# Patient Record
Sex: Female | Born: 1985 | Race: Black or African American | Hispanic: No | Marital: Married | Smoking: Former smoker
Health system: Southern US, Community
[De-identification: ages and names within clinical notes are randomized; demographics above are authoritative.]

## PROBLEM LIST (undated history)

## (undated) ENCOUNTER — Inpatient Hospital Stay (HOSPITAL_COMMUNITY): Payer: Self-pay

## (undated) DIAGNOSIS — IMO0002 Reserved for concepts with insufficient information to code with codable children: Secondary | ICD-10-CM

## (undated) DIAGNOSIS — B951 Streptococcus, group B, as the cause of diseases classified elsewhere: Secondary | ICD-10-CM

## (undated) DIAGNOSIS — N926 Irregular menstruation, unspecified: Secondary | ICD-10-CM

## (undated) DIAGNOSIS — R87619 Unspecified abnormal cytological findings in specimens from cervix uteri: Secondary | ICD-10-CM

## (undated) DIAGNOSIS — B009 Herpesviral infection, unspecified: Secondary | ICD-10-CM

## (undated) DIAGNOSIS — I10 Essential (primary) hypertension: Secondary | ICD-10-CM

## (undated) DIAGNOSIS — O2340 Unspecified infection of urinary tract in pregnancy, unspecified trimester: Secondary | ICD-10-CM

## (undated) DIAGNOSIS — E669 Obesity, unspecified: Secondary | ICD-10-CM

## (undated) HISTORY — DX: Obesity, unspecified: E66.9

## (undated) HISTORY — DX: Streptococcus, group b, as the cause of diseases classified elsewhere: B95.1

## (undated) HISTORY — PX: WISDOM TOOTH EXTRACTION: SHX21

## (undated) HISTORY — DX: Irregular menstruation, unspecified: N92.6

## (undated) HISTORY — DX: Herpesviral infection, unspecified: B00.9

## (undated) HISTORY — DX: Essential (primary) hypertension: I10

## (undated) HISTORY — DX: Unspecified abnormal cytological findings in specimens from cervix uteri: R87.619

## (undated) HISTORY — DX: Unspecified infection of urinary tract in pregnancy, unspecified trimester: O23.40

## (undated) HISTORY — DX: Reserved for concepts with insufficient information to code with codable children: IMO0002

## (undated) HISTORY — PX: TUBAL LIGATION: SHX77

---
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
## 2007-01-18 ENCOUNTER — Ambulatory Visit: Payer: Self-pay | Admitting: Sports Medicine

## 2007-02-19 ENCOUNTER — Ambulatory Visit: Payer: Self-pay | Admitting: Family Medicine

## 2007-02-19 LAB — CONVERTED CEMR LAB
Cholesterol, target level: 200 mg/dL
Hgb A1c MFr Bld: 10 %

## 2007-03-05 ENCOUNTER — Encounter: Admission: RE | Admit: 2007-03-05 | Discharge: 2007-03-26 | Payer: Self-pay | Admitting: Family Medicine

## 2007-03-11 ENCOUNTER — Encounter (INDEPENDENT_AMBULATORY_CARE_PROVIDER_SITE_OTHER): Payer: Self-pay | Admitting: Family Medicine

## 2007-03-25 ENCOUNTER — Encounter (INDEPENDENT_AMBULATORY_CARE_PROVIDER_SITE_OTHER): Payer: Self-pay | Admitting: Family Medicine

## 2007-04-05 ENCOUNTER — Ambulatory Visit: Payer: Self-pay | Admitting: Family Medicine

## 2007-04-21 ENCOUNTER — Encounter (INDEPENDENT_AMBULATORY_CARE_PROVIDER_SITE_OTHER): Payer: Self-pay | Admitting: Family Medicine

## 2007-04-21 ENCOUNTER — Other Ambulatory Visit: Admission: RE | Admit: 2007-04-21 | Discharge: 2007-04-21 | Payer: Self-pay | Admitting: Family Medicine

## 2007-04-21 ENCOUNTER — Ambulatory Visit: Payer: Self-pay | Admitting: Family Medicine

## 2007-04-21 LAB — CONVERTED CEMR LAB
ALT: 19 units/L (ref 0–35)
AST: 12 units/L (ref 0–37)
Albumin: 4.2 g/dL (ref 3.5–5.2)
Alkaline Phosphatase: 131 units/L — ABNORMAL HIGH (ref 39–117)
Beta hcg, urine, semiquantitative: NEGATIVE
CO2: 22 meq/L (ref 19–32)
Chloride: 100 meq/L (ref 96–112)
Cholesterol: 145 mg/dL (ref 0–200)
GC Probe Amp, Genital: NEGATIVE
Glucose, Bld: 302 mg/dL — ABNORMAL HIGH (ref 70–99)
Potassium: 4.5 meq/L (ref 3.5–5.3)
TSH: 1.031 microintl units/mL (ref 0.350–5.50)
Total Bilirubin: 0.5 mg/dL (ref 0.3–1.2)
Total Protein: 7.5 g/dL (ref 6.0–8.3)
Triglycerides: 83 mg/dL (ref <150)
Whiff Test: NEGATIVE

## 2007-05-24 ENCOUNTER — Ambulatory Visit: Payer: Self-pay | Admitting: Sports Medicine

## 2007-06-12 ENCOUNTER — Emergency Department (HOSPITAL_COMMUNITY): Admission: EM | Admit: 2007-06-12 | Discharge: 2007-06-12 | Payer: Self-pay | Admitting: Emergency Medicine

## 2007-06-16 ENCOUNTER — Encounter: Admission: RE | Admit: 2007-06-16 | Discharge: 2007-06-16 | Payer: Self-pay | Admitting: Family Medicine

## 2007-06-21 ENCOUNTER — Ambulatory Visit: Payer: Self-pay | Admitting: Family Medicine

## 2007-08-06 ENCOUNTER — Ambulatory Visit: Payer: Self-pay | Admitting: Family Medicine

## 2007-09-06 ENCOUNTER — Ambulatory Visit: Payer: Self-pay | Admitting: Family Medicine

## 2007-09-07 ENCOUNTER — Encounter (INDEPENDENT_AMBULATORY_CARE_PROVIDER_SITE_OTHER): Payer: Self-pay | Admitting: *Deleted

## 2007-09-20 ENCOUNTER — Ambulatory Visit: Payer: Self-pay | Admitting: Family Medicine

## 2007-09-20 ENCOUNTER — Encounter (INDEPENDENT_AMBULATORY_CARE_PROVIDER_SITE_OTHER): Payer: Self-pay | Admitting: Family Medicine

## 2007-09-20 DIAGNOSIS — E1165 Type 2 diabetes mellitus with hyperglycemia: Secondary | ICD-10-CM

## 2007-09-28 ENCOUNTER — Encounter (INDEPENDENT_AMBULATORY_CARE_PROVIDER_SITE_OTHER): Payer: Self-pay | Admitting: Family Medicine

## 2007-10-12 ENCOUNTER — Encounter (INDEPENDENT_AMBULATORY_CARE_PROVIDER_SITE_OTHER): Payer: Self-pay | Admitting: Family Medicine

## 2007-10-12 ENCOUNTER — Ambulatory Visit: Payer: Self-pay | Admitting: Family Medicine

## 2007-10-12 LAB — CONVERTED CEMR LAB
CO2: 22 meq/L (ref 19–32)
Creatinine, Ser: 0.77 mg/dL (ref 0.40–1.20)
Glucose, Bld: 151 mg/dL — ABNORMAL HIGH (ref 70–99)
Potassium: 4.4 meq/L (ref 3.5–5.3)

## 2007-10-13 ENCOUNTER — Emergency Department (HOSPITAL_COMMUNITY): Admission: EM | Admit: 2007-10-13 | Discharge: 2007-10-13 | Payer: Self-pay | Admitting: Emergency Medicine

## 2007-10-22 ENCOUNTER — Ambulatory Visit: Payer: Self-pay | Admitting: Family Medicine

## 2007-10-22 ENCOUNTER — Telehealth (INDEPENDENT_AMBULATORY_CARE_PROVIDER_SITE_OTHER): Payer: Self-pay | Admitting: Family Medicine

## 2007-10-25 ENCOUNTER — Encounter (INDEPENDENT_AMBULATORY_CARE_PROVIDER_SITE_OTHER): Payer: Self-pay | Admitting: Family Medicine

## 2007-11-01 ENCOUNTER — Telehealth: Payer: Self-pay | Admitting: *Deleted

## 2007-11-01 ENCOUNTER — Emergency Department (HOSPITAL_COMMUNITY): Admission: EM | Admit: 2007-11-01 | Discharge: 2007-11-01 | Payer: Self-pay | Admitting: Emergency Medicine

## 2007-11-23 ENCOUNTER — Ambulatory Visit: Payer: Self-pay | Admitting: Family Medicine

## 2007-11-23 ENCOUNTER — Telehealth: Payer: Self-pay | Admitting: *Deleted

## 2007-11-23 LAB — CONVERTED CEMR LAB
Beta hcg, urine, semiquantitative: NEGATIVE
Bilirubin Urine: NEGATIVE
Blood Glucose, Fingerstick: 188
Blood in Urine, dipstick: NEGATIVE
Glucose, Urine, Semiquant: NEGATIVE
Ketones, urine, test strip: NEGATIVE
Nitrite: NEGATIVE
Protein, U semiquant: NEGATIVE
Specific Gravity, Urine: 1.02
Urobilinogen, UA: 0.2
WBC Urine, dipstick: NEGATIVE
pH: 7.5

## 2007-12-06 ENCOUNTER — Emergency Department (HOSPITAL_COMMUNITY): Admission: EM | Admit: 2007-12-06 | Discharge: 2007-12-06 | Payer: Self-pay | Admitting: Emergency Medicine

## 2007-12-21 ENCOUNTER — Telehealth: Payer: Self-pay | Admitting: *Deleted

## 2008-01-07 ENCOUNTER — Emergency Department (HOSPITAL_COMMUNITY): Admission: EM | Admit: 2008-01-07 | Discharge: 2008-01-07 | Payer: Self-pay | Admitting: Emergency Medicine

## 2008-01-18 ENCOUNTER — Emergency Department (HOSPITAL_COMMUNITY): Admission: EM | Admit: 2008-01-18 | Discharge: 2008-01-19 | Payer: Self-pay | Admitting: Emergency Medicine

## 2008-01-25 ENCOUNTER — Ambulatory Visit: Payer: Self-pay | Admitting: Family Medicine

## 2008-01-25 ENCOUNTER — Encounter: Payer: Self-pay | Admitting: Family Medicine

## 2008-01-25 LAB — CONVERTED CEMR LAB: Beta hcg, urine, semiquantitative: NEGATIVE

## 2008-02-08 ENCOUNTER — Emergency Department (HOSPITAL_COMMUNITY): Admission: EM | Admit: 2008-02-08 | Discharge: 2008-02-08 | Payer: Self-pay | Admitting: Emergency Medicine

## 2008-02-08 ENCOUNTER — Telehealth: Payer: Self-pay | Admitting: *Deleted

## 2008-02-09 ENCOUNTER — Encounter (INDEPENDENT_AMBULATORY_CARE_PROVIDER_SITE_OTHER): Payer: Self-pay | Admitting: *Deleted

## 2008-02-18 ENCOUNTER — Ambulatory Visit: Payer: Self-pay | Admitting: Family Medicine

## 2008-02-18 ENCOUNTER — Encounter (INDEPENDENT_AMBULATORY_CARE_PROVIDER_SITE_OTHER): Payer: Self-pay | Admitting: Family Medicine

## 2008-02-18 LAB — CONVERTED CEMR LAB: Beta hcg, urine, semiquantitative: NEGATIVE

## 2008-02-21 LAB — CONVERTED CEMR LAB
Prolactin: 9.5 ng/mL
TSH: 0.514 microintl units/mL (ref 0.350–4.50)
hCG, Beta Chain, Quant, S: <2 milliintl units/mL

## 2008-04-05 ENCOUNTER — Ambulatory Visit: Payer: Self-pay | Admitting: Family Medicine

## 2008-04-05 ENCOUNTER — Encounter (INDEPENDENT_AMBULATORY_CARE_PROVIDER_SITE_OTHER): Payer: Self-pay | Admitting: Family Medicine

## 2008-04-05 LAB — CONVERTED CEMR LAB
Chlamydia, Swab/Urine, PCR: NEGATIVE
GC Probe Amp, Urine: NEGATIVE

## 2008-04-06 ENCOUNTER — Encounter (INDEPENDENT_AMBULATORY_CARE_PROVIDER_SITE_OTHER): Payer: Self-pay | Admitting: Family Medicine

## 2008-04-12 ENCOUNTER — Encounter (INDEPENDENT_AMBULATORY_CARE_PROVIDER_SITE_OTHER): Payer: Self-pay | Admitting: Family Medicine

## 2008-04-12 ENCOUNTER — Ambulatory Visit: Payer: Self-pay | Admitting: Family Medicine

## 2008-04-12 LAB — CONVERTED CEMR LAB: Beta hcg, urine, semiquantitative: NEGATIVE

## 2008-04-27 ENCOUNTER — Encounter (INDEPENDENT_AMBULATORY_CARE_PROVIDER_SITE_OTHER): Payer: Self-pay | Admitting: Family Medicine

## 2008-05-02 ENCOUNTER — Ambulatory Visit: Payer: Self-pay | Admitting: Family Medicine

## 2008-05-29 ENCOUNTER — Ambulatory Visit: Payer: Self-pay | Admitting: Family Medicine

## 2008-05-29 ENCOUNTER — Encounter (INDEPENDENT_AMBULATORY_CARE_PROVIDER_SITE_OTHER): Payer: Self-pay | Admitting: Family Medicine

## 2008-07-18 ENCOUNTER — Encounter (INDEPENDENT_AMBULATORY_CARE_PROVIDER_SITE_OTHER): Payer: Self-pay | Admitting: Family Medicine

## 2008-07-18 ENCOUNTER — Ambulatory Visit: Payer: Self-pay | Admitting: Family Medicine

## 2008-07-18 ENCOUNTER — Other Ambulatory Visit: Admission: RE | Admit: 2008-07-18 | Discharge: 2008-07-18 | Payer: Self-pay | Admitting: Family Medicine

## 2008-07-19 ENCOUNTER — Encounter (INDEPENDENT_AMBULATORY_CARE_PROVIDER_SITE_OTHER): Payer: Self-pay | Admitting: Family Medicine

## 2008-07-20 ENCOUNTER — Telehealth: Payer: Self-pay | Admitting: *Deleted

## 2008-07-25 ENCOUNTER — Encounter (INDEPENDENT_AMBULATORY_CARE_PROVIDER_SITE_OTHER): Payer: Self-pay | Admitting: Family Medicine

## 2008-08-16 ENCOUNTER — Ambulatory Visit: Payer: Self-pay | Admitting: Family Medicine

## 2008-09-05 ENCOUNTER — Encounter (INDEPENDENT_AMBULATORY_CARE_PROVIDER_SITE_OTHER): Payer: Self-pay | Admitting: Family Medicine

## 2008-09-05 ENCOUNTER — Ambulatory Visit: Payer: Self-pay | Admitting: Family Medicine

## 2008-09-05 LAB — CONVERTED CEMR LAB: Whiff Test: POSITIVE

## 2008-09-07 ENCOUNTER — Telehealth (INDEPENDENT_AMBULATORY_CARE_PROVIDER_SITE_OTHER): Payer: Self-pay | Admitting: Family Medicine

## 2008-09-07 ENCOUNTER — Ambulatory Visit: Payer: Self-pay | Admitting: Family Medicine

## 2008-09-07 LAB — CONVERTED CEMR LAB: Chlamydia, DNA Probe: POSITIVE — AB

## 2008-09-19 ENCOUNTER — Telehealth (INDEPENDENT_AMBULATORY_CARE_PROVIDER_SITE_OTHER): Payer: Self-pay | Admitting: Family Medicine

## 2008-11-07 ENCOUNTER — Telehealth: Payer: Self-pay | Admitting: Family Medicine

## 2008-11-16 ENCOUNTER — Encounter: Payer: Self-pay | Admitting: Family Medicine

## 2008-11-16 ENCOUNTER — Ambulatory Visit: Payer: Self-pay | Admitting: Family Medicine

## 2008-11-16 ENCOUNTER — Telehealth: Payer: Self-pay | Admitting: Family Medicine

## 2008-11-16 LAB — CONVERTED CEMR LAB
GC Probe Amp, Genital: NEGATIVE
Whiff Test: NEGATIVE

## 2008-11-20 ENCOUNTER — Emergency Department (HOSPITAL_COMMUNITY): Admission: EM | Admit: 2008-11-20 | Discharge: 2008-11-20 | Payer: Self-pay | Admitting: Emergency Medicine

## 2008-12-06 ENCOUNTER — Encounter: Payer: Self-pay | Admitting: Family Medicine

## 2008-12-07 ENCOUNTER — Ambulatory Visit: Payer: Self-pay | Admitting: Family Medicine

## 2008-12-07 ENCOUNTER — Encounter: Payer: Self-pay | Admitting: Family Medicine

## 2008-12-07 LAB — CONVERTED CEMR LAB
Albumin: 4 g/dL (ref 3.5–5.2)
Alkaline Phosphatase: 120 units/L — ABNORMAL HIGH (ref 39–117)
Calcium: 9.3 mg/dL (ref 8.4–10.5)
Creatinine, Ser: 0.64 mg/dL (ref 0.40–1.20)
Glucose, Bld: 212 mg/dL — ABNORMAL HIGH (ref 70–99)
Potassium: 4.6 meq/L (ref 3.5–5.3)

## 2008-12-11 ENCOUNTER — Telehealth: Payer: Self-pay | Admitting: Family Medicine

## 2008-12-22 ENCOUNTER — Telehealth: Payer: Self-pay | Admitting: Family Medicine

## 2009-02-28 ENCOUNTER — Emergency Department (HOSPITAL_COMMUNITY): Admission: EM | Admit: 2009-02-28 | Discharge: 2009-02-28 | Payer: Self-pay | Admitting: Emergency Medicine

## 2009-05-11 ENCOUNTER — Emergency Department (HOSPITAL_COMMUNITY): Admission: EM | Admit: 2009-05-11 | Discharge: 2009-05-11 | Payer: Self-pay | Admitting: Emergency Medicine

## 2009-05-18 ENCOUNTER — Ambulatory Visit: Payer: Self-pay | Admitting: Family Medicine

## 2009-05-18 LAB — CONVERTED CEMR LAB
Beta hcg, urine, semiquantitative: NEGATIVE
Hgb A1c MFr Bld: 8.9 %

## 2009-05-30 ENCOUNTER — Encounter: Payer: Self-pay | Admitting: Family Medicine

## 2009-05-30 ENCOUNTER — Ambulatory Visit: Payer: Self-pay | Admitting: Family Medicine

## 2009-06-04 ENCOUNTER — Encounter: Payer: Self-pay | Admitting: Family Medicine

## 2009-06-06 ENCOUNTER — Emergency Department (HOSPITAL_COMMUNITY): Admission: EM | Admit: 2009-06-06 | Discharge: 2009-06-06 | Payer: Self-pay | Admitting: Emergency Medicine

## 2009-07-25 ENCOUNTER — Ambulatory Visit: Payer: Self-pay | Admitting: Family Medicine

## 2009-07-25 ENCOUNTER — Encounter: Payer: Self-pay | Admitting: Family Medicine

## 2009-07-25 DIAGNOSIS — Z9189 Other specified personal risk factors, not elsewhere classified: Secondary | ICD-10-CM | POA: Insufficient documentation

## 2009-07-25 LAB — CONVERTED CEMR LAB: GC Probe Amp, Genital: NEGATIVE

## 2009-07-26 ENCOUNTER — Telehealth: Payer: Self-pay | Admitting: Family Medicine

## 2009-07-26 ENCOUNTER — Encounter: Payer: Self-pay | Admitting: Family Medicine

## 2009-07-27 ENCOUNTER — Ambulatory Visit: Payer: Self-pay | Admitting: Family Medicine

## 2009-07-28 ENCOUNTER — Emergency Department (HOSPITAL_COMMUNITY): Admission: EM | Admit: 2009-07-28 | Discharge: 2009-07-28 | Payer: Self-pay | Admitting: Emergency Medicine

## 2009-08-02 ENCOUNTER — Ambulatory Visit: Payer: Self-pay | Admitting: Family Medicine

## 2009-08-09 ENCOUNTER — Ambulatory Visit: Payer: Self-pay | Admitting: Family Medicine

## 2009-08-09 DIAGNOSIS — I10 Essential (primary) hypertension: Secondary | ICD-10-CM

## 2009-08-09 DIAGNOSIS — E1159 Type 2 diabetes mellitus with other circulatory complications: Secondary | ICD-10-CM | POA: Insufficient documentation

## 2009-09-12 ENCOUNTER — Emergency Department (HOSPITAL_COMMUNITY): Admission: EM | Admit: 2009-09-12 | Discharge: 2009-09-12 | Payer: Self-pay | Admitting: Emergency Medicine

## 2009-10-24 ENCOUNTER — Emergency Department (HOSPITAL_COMMUNITY): Admission: EM | Admit: 2009-10-24 | Discharge: 2009-10-24 | Payer: Self-pay | Admitting: Family Medicine

## 2009-11-26 ENCOUNTER — Emergency Department (HOSPITAL_COMMUNITY): Admission: EM | Admit: 2009-11-26 | Discharge: 2009-11-26 | Payer: Self-pay | Admitting: Emergency Medicine

## 2009-12-04 ENCOUNTER — Emergency Department (HOSPITAL_COMMUNITY): Admission: EM | Admit: 2009-12-04 | Discharge: 2009-12-04 | Payer: Self-pay | Admitting: Emergency Medicine

## 2010-01-07 ENCOUNTER — Ambulatory Visit: Payer: Self-pay | Admitting: Family Medicine

## 2010-01-07 ENCOUNTER — Encounter: Payer: Self-pay | Admitting: Family Medicine

## 2010-01-07 LAB — CONVERTED CEMR LAB: GC Probe Amp, Genital: NEGATIVE

## 2010-01-08 ENCOUNTER — Encounter: Payer: Self-pay | Admitting: Family Medicine

## 2010-01-15 ENCOUNTER — Encounter: Payer: Self-pay | Admitting: Family Medicine

## 2010-02-11 ENCOUNTER — Encounter: Payer: Self-pay | Admitting: Family Medicine

## 2010-02-11 DIAGNOSIS — J45909 Unspecified asthma, uncomplicated: Secondary | ICD-10-CM | POA: Insufficient documentation

## 2010-03-06 ENCOUNTER — Encounter: Payer: Self-pay | Admitting: Family Medicine

## 2010-03-06 ENCOUNTER — Ambulatory Visit: Payer: Self-pay | Admitting: Family Medicine

## 2010-03-06 LAB — CONVERTED CEMR LAB
Beta hcg, urine, semiquantitative: NEGATIVE
Hgb A1c MFr Bld: 10 %

## 2010-03-27 LAB — CONVERTED CEMR LAB
Alkaline Phosphatase: 126 units/L — ABNORMAL HIGH (ref 39–117)
BUN: 11 mg/dL (ref 6–23)
Calcium: 9.3 mg/dL (ref 8.4–10.5)
Chloride: 102 meq/L (ref 96–112)
Creatinine, Ser: 0.66 mg/dL (ref 0.40–1.20)
Glucose, Bld: 316 mg/dL — ABNORMAL HIGH (ref 70–99)
Total Bilirubin: 0.4 mg/dL (ref 0.3–1.2)

## 2010-04-10 ENCOUNTER — Encounter: Payer: Self-pay | Admitting: Family Medicine

## 2010-04-29 ENCOUNTER — Ambulatory Visit: Payer: Self-pay | Admitting: Family Medicine

## 2010-04-29 DIAGNOSIS — M25539 Pain in unspecified wrist: Secondary | ICD-10-CM

## 2010-04-29 DIAGNOSIS — N898 Other specified noninflammatory disorders of vagina: Secondary | ICD-10-CM | POA: Insufficient documentation

## 2010-04-29 LAB — CONVERTED CEMR LAB: Whiff Test: NEGATIVE

## 2010-06-11 NOTE — Assessment & Plan Note (Signed)
 Summary: vomiting since last night (has DM)   Vital Signs:  Patient Profile:   25 Years Old Female Height:     61 inches Weight:      197.5 pounds Temp:     98.9 degrees F Pulse rate:   96 / minute BP sitting:   134 / 73  (left arm)  Vitals Entered By: AVELINA SHARPS RN (November 23, 2007 2:00 PM)             Is Patient Diabetic? No CBG Result 188     Chief Complaint:  started with vomiting at 2:00 am today and last vomiting at 10:00 AM.  History of Present Illness: This is a 25 year old female with vomiting since 2:00 am last night.  Woke up and felt nauseous, and had an episode of emesis, appeared to be what she had last eaten, no blood, mucus, or bile.  Was able to get back to sleep but had another episode this morning.  Pt was able to eat and drink fluids this morning .  Pt also gets Depo-provera but has not had a shot in over 6 months and does not remember when last menstrual period was.  States is currently sexually active.  Denies headache, blurry vision, focal weakness, sore throat, swollen nodes, cough, SOB, CP, abd pain, diarrhea, constipation, dysuria, frequency, urgency, vaginal odor, vaginal discharge.     Current Allergies: No known allergies   Past Medical History:    h/o ADHD- no tx 7/05, LSIL PAP 6/06    Genital Herpes    DIabetes Mellitus Type 2      Physical Exam  General:     Well-developed,well-nourished,in no acute distress; alert,appropriate and cooperative throughout examination Head:     Normocephalic and atraumatic without obvious abnormalities. No apparent alopecia or balding. Eyes:     No corneal or conjunctival inflammation noted. EOMI. Perrla. Funduscopic exam benign, without hemorrhages, exudates or papilledema. Vision grossly normal. Ears:     External ear exam shows no significant lesions or deformities.  Otoscopic examination reveals clear canals, tympanic membranes are intact bilaterally without bulging, retraction, inflammation or  discharge. Hearing is grossly normal bilaterally. Nose:     External nasal examination shows no deformity or inflammation. Nasal mucosa are pink and moist without lesions or exudates. Mouth:     Oral mucosa and oropharynx without lesions or exudates. Neck:     No deformities, masses, or tenderness noted. Lungs:     Normal respiratory effort, chest expands symmetrically. Lungs are clear to auscultation, no crackles or wheezes. Heart:     Normal rate and regular rhythm. S1 and S2 normal without gallop, murmur, click, rub or other extra sounds. Abdomen:     Bowel sounds positive,abdomen soft and non-tender without masses, organomegaly or hernias noted. Extremities:     Normal, ambulatory, well perfused Neurologic:     Grossly Normal Skin:     No rashes noted. Cervical Nodes:     No lymphadenopathy noted Axillary Nodes:     No palpable lymphadenopathy Inguinal Nodes:     No significant adenopathy    Impression & Recommendations:  Problem # 1:  PERSISTENT VOMITING (ICD-536.2) Ordered U/A and Urine pregnancy test, both came back negative.  Likely viral syndrome. Gave patient Phenergan  25mg  IM X1 for nausea. Pt instructed to call back and make f/u appt if symptoms worsen.  Orders: U Preg-[facility] (81025) [facility]- Est  Level 4 (99214) Promethazine  up to 50mg  (G7449)    Problem # 2:  DIABETES MELLITUS, TYPE II, UNCONTROLLED, W/RENAL COMPS (ICD-250.42) Pt instructed to return to clinic to see me in one month regarding her Diabetes and other medical problems Her updated medication list for this problem includes:    Janumet 50-1000 Mg Tabs (Sitagliptin -metformin  hcl) ..... One tablet twice a day for diabetes control.  stop taking your metformin .    Lisinopril 2.5 Mg Tabs (Lisinopril) ..... One tablet daily for kidney protection in diabetes  Orders: Urinalysis-[facility] (00000) Glucose Cap-[facility] (17051)   Complete Medication List: 1)  Janumet 50-1000 Mg Tabs (Sitagliptin -metformin  hcl) ....  One tablet twice a day for diabetes control.  stop taking your metformin . 2)  Valtrex  500 Mg Tabs (Valacyclovir  hcl) .SABRA.. 1 tab daily for suppression of severe attacks. 3)  Depo-provera 150 Mg/ml Susp (Medroxyprogesterone acetate) .... Inject intramuscularly as directed 4)  Lisinopril 2.5 Mg Tabs (Lisinopril) .... One tablet daily for kidney protection in diabetes   Patient Instructions: 1)  -Please allow your sister to drive you home.   2)  -Please call and make a follow up appointment in one week if vomiting continues, nausea doesnt resolve, or symptoms significantly worsen.  3)  -Call back in one month to make a follow up appointment to see me regarding your other medical problems. 4)  -If you could be exposed to sexually transmitted diseases, you should use a condom. 5)  -If you are having sex and you or your partner don't want a child, use contraception. 6)  -Check your blood sugars regularly.   ] Laboratory Results   Urine Tests  Date/Time Received: November 23, 2007 2:50 PM  Date/Time Reported: November 23, 2007 3:01 PM   Routine Urinalysis   Color: yellow Appearance: Clear Glucose: negative   (Normal Range: Negative) Bilirubin: negative   (Normal Range: Negative) Ketone: negative   (Normal Range: Negative) Spec. Gravity: 1.020   (Normal Range: 1.003-1.035) Blood: negative   (Normal Range: Negative) pH: 7.5   (Normal Range: 5.0-8.0) Protein: negative   (Normal Range: Negative) Urobilinogen: 0.2   (Normal Range: 0-1) Nitrite: negative   (Normal Range: Negative) Leukocyte Esterace: negative   (Normal Range: Negative)    Urine HCG: negative Comments: ...........test performed by...........SABRAArland Morel, CMA   Blood Tests   Date/Time Received: November 23, 2007 2:50 PM  Date/Time Reported: November 23, 2007 3:02 PM  Time patient last ate: 10:00 AM  CBG Random:: 188mg /dL  Comments: ...........test performed by...........SABRAArland Morel, CMA        Medication  Administration  Injection # 1:    Medication: Promethazine  up to 50mg     Diagnosis: PERSISTENT VOMITING (ICD-536.2)    Route: IM    Site: RUOQ gluteus    Exp Date: 04/11/2009    Lot #: [id number]    Mfr: [email or web address]    Comments: Gave 25 mg. Pt. instructed not to drive.    Patient tolerated injection without complications    Given by: NATHANEL SABA RN (November 23, 2007 4:34 PM)  Orders Added: 1)  Urinalysis-[facility] [00000] 2)  U Preg-[facility] [81025] 3)  Glucose Cap-[facility] [82948] 4)  [facility]- Est  Level 4 [00785] 5)  Promethazine  up to 50mg  [J2550]
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
## 2010-06-11 NOTE — Letter (Signed)
Summary: Generic Letter  [facility]  [address]   [place], [place] [address]   Phone: [phone]  Fax: [phone]    01/08/2010  Roberta KOLK [address] [place], [place]  [address]  Dear Ms. DOUCETTE,  Your recent test results were normal. Please schedule an appointment in clinic if your symptoms do not resolve by calling [phone].  Sincerely,  Lloyd Huger MD  Appended Document: Generic Letter mailed

## 2010-06-11 NOTE — Miscellaneous (Signed)
Summary: Needs Chlamydia Treatment  Patient has been informed that she has a positive test for Chlamydia and requires treatment.  When she comes in for treatment, please confirm that she is not allergic to Azithromycin and give her 1 gram Azithromycin by mouth x1.  Please let this note serve as the order.

## 2010-06-11 NOTE — Assessment & Plan Note (Signed)
Summary: F/U VISIT/[facility]   Vital Signs:  Patient profile:   25 year old female Weight:      210.6 pounds Temp:     98.7 degrees F oral Pulse rate:   79 / minute Pulse rhythm:   regular BP sitting:   115 / 64  (left arm) Cuff size:   large  Vitals Entered By: Loralee Pacas CMA (May 30, 2009 2:28 PM)  Primary Care Provider:  Asher Muir MD  CC:  dm.  History of Present Illness: 1.  dm--Here for dm follow up.  A1C 8.9.  had been under 7 when on janumet and glyburide.  has to pay for own meds, so needs something cheap.  not interested in nutrition counsleing.  feels like she knows what to do.  has had eye exam in last year, but we do not have records.  on acei, but not asa  Current Medications (verified): 1)  Valtrex 500 Mg  Tabs (Valacyclovir Hcl) .Marland Kitchen.. 1 Tab Daily For Suppression of Severe Attacks. 2)  Lisinopril 2.5 Mg  Tabs (Lisinopril) .... One Tablet Daily For Kidney Protection in Diabetes 3)  Glyburide 5 Mg Tabs (Glyburide) .Marland Kitchen.. 1 Tab By Mouth Daily With Food For Diabetes  Allergies: No Known Drug Allergies  Past History:  Past Medical History: Last updated: 05/18/2009 Pt has a questionable history of asthma genital herpes.  Pt has a history of allergic rhinitis  diabetes mellitus type 2. has mirena   Physical Exam  General:  alert, well-developed,obese, and in no apparent distress  Lungs:  Normal respiratory effort, chest expands symmetrically. Lungs are clear to auscultation, no crackles or wheezes. Heart:  Normal rate and regular rhythm. S1 and S2 normal without gallop, murmur, click, rub or other extra sounds. Additional Exam:  vital signs reviewed    Impression & Recommendations:  Problem # 1:  DIABETES MELLITUS, TYPE II (ICD-250.00) restart metformin.  recommended diet and exercise.  she should make appt if she decides she may want to get pregnant.  needs to be off lisinopril.  has iud in still, so not likely to get pregnant until she decides to take  that out.  check ldl.  start asa Her updated medication list for this problem includes:    Lisinopril 2.5 Mg Tabs (Lisinopril) ..... One tablet daily for kidney protection in diabetes    Glyburide 5 Mg Tabs (Glyburide) .Marland Kitchen... 1 tab by mouth daily with food for diabetes    Metformin Hcl 1000 Mg Tabs (Metformin hcl) .Marland Kitchen... 1/2 tablet daily for a week; then 1/2 tab bid for 1 week; then 1 tab in am and 1/2 at night for 1 weekt; then 1 tab two times a day    Bayer Childrens Aspirin 81 Mg Chew (Aspirin) .Marland Kitchen... 1 tab by mouth daily  Orders: Direct LDL-[facility] ([address]11) [facility]- Est Level  3 (91478)  Complete Medication List: 1)  Valtrex 500 Mg Tabs (Valacyclovir hcl) .Marland Kitchen.. 1 tab daily for suppression of severe attacks. 2)  Lisinopril 2.5 Mg Tabs (Lisinopril) .... One tablet daily for kidney protection in diabetes 3)  Glyburide 5 Mg Tabs (Glyburide) .Marland Kitchen.. 1 tab by mouth daily with food for diabetes 4)  Metformin Hcl 1000 Mg Tabs (Metformin hcl) .... 1/2 tablet daily for a week; then 1/2 tab bid for 1 week; then 1 tab in am and 1/2 at night for 1 weekt; then 1 tab two times a day 5)  Bayer Childrens Aspirin 81 Mg Chew (Aspirin) .Marland Kitchen.. 1 tab by mouth daily  Patient Instructions: 1)  It was nice to see you today.  2)  Start taking metformin.  Keep taking glyburide. 3)  Call us with the name of the eye doctor you saw. 4)  Make an appointment if you are thinking about getting pregnant. 5)  Please schedule a follow-up appointment in 3 months .  6)  Keep up the diet and exercise because that will help your sugars come down too. 7)  Start taking a baby aspirin every day. Prescriptions: METFORMIN HCL 1000 MG TABS (METFORMIN HCL) 1/2 tablet daily for a week; then 1/2 tab bid for 1 week; then 1 tab in am and 1/2 at night for 1 weekt; then 1 tab two times a day  #60 x 11   Entered and Authorized by:   Asher Muir MD   Signed by:   Asher Muir MD on 05/30/2009   Method used:   [facility]        [facility]   [address] [phone][facility] (retail)       [address]       [place], [place]  [address]       Ph: [phone] or [phone]       Fax: [phone]   RxID:   [id number]   Prevention & Chronic Care Immunizations   Influenza vaccine: Fluvax 3+  (05/24/2007)   Influenza vaccine due: 05/23/2008    Tetanus booster: 07/18/2008: Tdap    Pneumococcal vaccine: Not documented  Other Screening   Pap smear: NEGATIVE FOR INTRAEPITHELIAL LESIONS OR MALIGNANCY.  (07/18/2008)   Pap smear due: 04/25/2008   Smoking status: never  (12/07/2008)  Diabetes Mellitus   HgbA1C: 8.9  (05/18/2009)   HgbA1C action/deferral: Ordered  (12/06/2008)   Hemoglobin A1C due: 04/25/2008    Eye exam: normal  (03/26/2007)   Diabetic eye exam action/deferral: Ophthalmology referral  (12/06/2008)   Eye exam due: 03/25/2008    Foot exam: yes  (12/07/2008)   High risk foot: Not documented   Foot care education: Not documented   Foot exam due: 10/21/2008    Urine microalbumin/creatinine ratio: Not documented   Urine microalbumin action/deferral: Ordered   Urine microalbumin/cr due: 04/20/2008    Diabetes flowsheet reviewed?: Yes   Progress toward A1C goal: Unchanged  Self-Management Support :    Diabetes self-management support: Copy of home glucose meter record, CBG self-monitoring log, Written self-care plan, Education handout  (05/30/2009)   Diabetes care plan printed   Diabetes education handout printed

## 2010-06-11 NOTE — Miscellaneous (Signed)
Summary: IUD Removal  Clinical Lists Changes  Problems: Removed problem of CHLAMYDIAL INFECTION (ICD-099.41) Removed problem of BACTERIAL VAGINITIS (ICD-616.10) Removed problem of IUD (ICD-V25.1) Removed problem of CANDIDIASIS, VAGINAL (ICD-112.1) Removed problem of VULVOVAGINITIS (ICD-616.10) Added new problem of INTRAUTERINE CONTRACEPTIVE DEVICE REMOVAL (ICD-V25.42)

## 2010-06-11 NOTE — Assessment & Plan Note (Signed)
Summary: STD treatment.ls  Nurse Visit   Allergies: No Known Drug Allergies  Medication Administration  Medication # 1:    Medication: Azithromycin oral    Diagnosis: CHLAMYDIAL INFECTION (ICD-099.41)    Dose: 1 gram    Route: po    Exp Date: 04/11/2010    Lot #: [id number]    Mfr: [email or web address]    Patient tolerated medication without complications    Given by: Theresia Lo RN (July 27, 2009 10:51 AM)  Orders Added: 1)  Est Level 1- [facility] [91478] 2)  Azithromycin oral [Q0144]   Medication Administration  Medication # 1:    Medication: Azithromycin oral    Diagnosis: CHLAMYDIAL INFECTION (ICD-099.41)    Dose: 1 gram    Route: po    Exp Date: 04/11/2010    Lot #: [id number]    Mfr: [email or web address]    Patient tolerated medication without complications    Given by: Theresia Lo RN (July 27, 2009 10:51 AM)  Orders Added: 1)  Est Level 1- [facility] [08657] 2)  Azithromycin oral [Q0144]    Appended Document: STD treatment.ls Communicable Disease report faxed to [facility].

## 2010-06-11 NOTE — Miscellaneous (Signed)
Summary: Problem update-asthma  Clinical Lists Changes  Problems: Added new problem of ASTHMA, INTERMITTENT (ICD-493.90) Removed problem of Question of  ASTHMA (ICD-493.90)

## 2010-06-11 NOTE — Assessment & Plan Note (Signed)
Summary: pregnancy test/birth control/eo   Vital Signs:  Patient profile:   25 year old female Height:      62 inches Weight:      208 pounds BMI:     38.18 Temp:     98.7 degrees F oral Pulse rate:   82 / minute BP sitting:   115 / 74  (right arm) Cuff size:   large  Vitals Entered By: Jimmy Footman, CMA (March 06, 2010 2:41 PM) CC: preg test/ contraceptive Is Patient Diabetic? No Pain Assessment Patient in pain? no        Primary Provider:  Lloyd Huger MD  CC:  preg test/ contraceptive.  History of Present Illness: 1. Contraception: Patient never picked up her OCPs prescribed last visit after we removed her IUD. Presents today for preg test and to get script at new pharmacy. Not currently sexually active, but does not desire to become pregnant.   2. DM: takes metformin and needs refill. Has tried walking for exercise but works long hours at [employer]. Discussed nutrition strategies, but would like to revisit this in a more detailed fashion. Has found success with nutrition counseling in the past. Does not check her blood sugar at home.   Preventive Screening-Counseling & Management  Alcohol-Tobacco     Smoking Status: never  Allergies: No Known Drug Allergies  Past History:  Past Medical History: Last updated: 01/07/2010 Pt has a questionable history of asthma genital herpes.  Pt has a history of allergic rhinitis  diabetes mellitus type 2.  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  She does not drink, smoke or consume alcohol.  She is sexually active with men 1 partners in the last year.   Hx of IUD removed after multiple infections (chlamydia, concern for PID) She walks for exercise occaisionally.  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  She does not drink, smoke or consume alcohol.  She is sexually active with men 1 partners in the last year.   Hx of IUD removed after multiple infections (chlamydia, concern for PID) She walks for exercise occaisionally.  Physical Exam  General:  Well-developed,well-nourished,in no acute distress; alert,appropriate and cooperative throughout examination Head:  Normocephalic and atraumatic without obvious abnormalities. Mouth:  Oral mucosa and oropharynx without lesions or exudates.  Teeth in good repair. Lungs:  Normal respiratory effort, chest expands symmetrically. Lungs are clear to auscultation, no crackles or wheezes. Heart:  Normal rate and regular rhythm. S1 and S2 normal without gallop, murmur, click, rub or other extra sounds. Abdomen:  Bowel sounds positive,abdomen soft and non-tender without masses, organomegaly or hernias noted. Msk:  No deformity or scoliosis noted of thoracic or lumbar spine.   Extremities:  No clubbing, cyanosis, edema, or deformity noted with normal full range of motion of all joints.   Neurologic:  alert & oriented X3, cranial nerves II-XII intact, strength normal in all extremities, and sensation intact to light touch.   Skin:  Intact without suspicious lesions or rashes Psych:  Cognition and judgment appear intact. Alert and cooperative with normal attention span and concentration. No apparent delusions, illusions, hallucinations   Impression & Recommendations:  Problem # 1:  CONTRACEPTIVE MANAGEMENT (ICD-V25.09) Patient will start OCPs today. Advised on what to do if she misses a pill. Will use back up method during first one week if she becomes sexually active (not currently).  Orders: U Preg-[facility] (81025) [facility]- Est Level  3 (81829)  Problem  # 2:  DIABETES MELLITUS, TYPE II (ICD-250.00) Will check labs today. Continue metformin. Discussed diet and exercise for glucose control. Referred for nutrition counseling. She will f/u in one month to discuss diabetes managment and may consider adding additional pharmacologic agents for control. She will also need counseling on glucometer.   The following medications were removed from the medication list:    Glyburide 5 Mg Tabs (Glyburide) .Marland Kitchen... 1 tab by mouth daily with food for diabetes Her updated medication list for this problem includes:    Lisinopril 5 Mg Tabs (Lisinopril) .Marland Kitchen... 1 tab by mouth daily for blood pressure and kidney protection    Metformin Hcl 1000 Mg Tabs (Metformin hcl) .Marland Kitchen... 1 tab two times a day for diabetes    Bayer Childrens Aspirin 81 Mg Chew (Aspirin) .Marland Kitchen... 1 tab by mouth daily  Orders: Comp Met-[facility] ([address]16-96789) A1C-[facility] (38101) Nutrition Referral (Nutrition) [facility]- Est Level  3 (75102)  Complete Medication List: 1)  Valtrex 500 Mg Tabs (Valacyclovir hcl) .Marland Kitchen.. 1 tab daily for suppression of severe attacks. 2)  Lisinopril 5 Mg Tabs (Lisinopril) .Marland Kitchen.. 1 tab by mouth daily for blood pressure and kidney protection 3)  Metformin Hcl 1000 Mg Tabs (Metformin hcl) .Marland Kitchen.. 1 tab two times a day for diabetes 4)  Bayer Childrens Aspirin 81 Mg Chew (Aspirin) .Marland Kitchen.. 1 tab by mouth daily 5)  Sprintec 28 0.25-35 Mg-mcg Tabs (Norgestimate-eth estradiol) .... Take one tab daily by mouth every 28 days.  Patient Instructions: 1)  Nice to see you again. 2)  [facility] pick up your [facility] at [facility]. 3)  If you miss one birth control pill, take 2 the next day.  4)  If you miss more than one day, use condoms if you are sexually active for the next week. 5)  Please schedule an appointment with me in one month to talk about your diabetes. Prescriptions: METFORMIN HCL 1000 MG TABS (METFORMIN HCL) 1 tab two times a day for diabetes  #60 x 12   Entered and Authorized by:   Lloyd Huger MD    Signed by:   Lloyd Huger MD on 03/06/2010   Method used:   Electronically to        [facility] [address] (retail)       [phone]       [place], [place]  [address]       Ph: [phone]       Fax: [phone]   RxID:   [id number] LISINOPRIL 5 MG TABS (LISINOPRIL) 1 tab by mouth daily for blood pressure and kidney protection  #30 x 6   Entered and Authorized by:   Lloyd Huger MD   Signed by:   Lloyd Huger MD on 03/06/2010   Method used:   Electronically to        [facility] [address] (retail)       [phone]       [place], [place]  [address]       Ph: [phone]       Fax: [phone]   RxID:   [id number] SPRINTEC 28 0.25-35 MG-MCG TABS (NORGESTIMATE-ETH  ESTRADIOL) Take one tab daily by mouth every 28 days.  #28 x 6   Entered and Authorized by:   Lloyd Huger MD   Signed by:   Lloyd Huger MD on 03/06/2010   Method used:   Electronically to        [facility] [address] (retail)       [phone]       [place], [place]  [address]       Ph: [phone]       Fax: [phone]   RxID:   [id number]    Orders Added: 1)  U Preg-[facility] [81025] 2)  Comp Met-[facility] [24401-02725] 3)  A1C-[facility] [83036] 4)  Nutrition Referral [Nutrition] 5)  [facility]- Est Level  3 [36644]    Laboratory Results   Urine Tests  Date/Time Received: March 06, 2010 2:50 PM  Date/Time Reported: March 06, 2010 2:57 PM     Urine HCG: negative Comments: ...............test performed by......Marland KitchenBonnie A. [place], MLS (ASCP)cm   Blood Tests   Date/Time Received: March 06, 2010 3:31 PM  Date/Time Reported: March 06, 2010 4:39 PM   HGBA1C: 10.0%   (Normal Range: Non-Diabetic - 3-6%   Control Diabetic - 6-8%)  Comments: ...............test performed by......Marland KitchenBonnie A. [place], MLS (ASCP)cm       Prevention & Chronic Care Immunizations   Influenza vaccine: Fluvax 3+   (05/24/2007)   Influenza vaccine due: 05/23/2008    Tetanus booster: 07/25/2009: Tdap    Pneumococcal vaccine: Not documented  Other Screening   Pap smear: NEGATIVE FOR INTRAEPITHELIAL LESIONS OR MALIGNANCY.  (07/18/2008)   Pap smear due: 04/25/2008   Smoking status: never  (03/06/2010)  Diabetes Mellitus   HgbA1C: 10.0  (03/06/2010)   HgbA1C action/deferral: Ordered  (12/06/2008)   Hemoglobin A1C due: 04/25/2008    Eye exam: normal  (03/26/2007)   Diabetic eye exam action/deferral: Ophthalmology referral  (12/06/2008)   Eye exam due: 03/25/2008    Foot exam: yes  (12/07/2008)   High risk foot: Not documented   Foot care education: Not documented   Foot exam due: 10/21/2008    Urine microalbumin/creatinine ratio: Not documented   Urine microalbumin action/deferral: Ordered   Urine microalbumin/cr due: 04/20/2008  Hypertension   Last Blood Pressure: 115 / 74  (03/06/2010)   Serum creatinine: 0.64  (12/07/2008)   Serum potassium 4.6  (12/07/2008) CMP ordered   Self-Management Support :    Diabetes self-management support: Copy of home glucose meter record, CBG self-monitoring log, Written self-care plan, Education handout  (05/30/2009)   Referred.    Hypertension self-management support: Not documented

## 2010-06-11 NOTE — Assessment & Plan Note (Signed)
Summary: f/u,df   Vital Signs:  Patient profile:   25 year old female Weight:      207.9 pounds Temp:     98.4 degrees F oral Pulse rate:   81 / minute Pulse rhythm:   regular BP sitting:   137 / 84  (left arm) Cuff size:   large  Vitals Entered By: Loralee Pacas CMA (August 09, 2009 10:17 AM)  Primary Care Provider:  Asher Muir MD  CC:  vulvovaginitis, dm, and htn.  History of Present Illness: 1.  vulvovaginitis/IUD--last visit, diagnosed with vulvovaginitis.  treated with diflucan and antifungal cream.  her sympotms have completely resolved.  when she was at ER, they were not able to visualize her IUD.  we did not try last visit becasue of her extreme discomfort.    2.  diabetes--A1C 8.9.  she was prescribed metformin, but apparently did not understand the instructions and was only taking 1000mg  once a day instead of two times a day.  also not taking glyburide.  does not have a meter so does not check cbgs  3.  hypertension--had not been formally diagnosed with hypertension, but has been a little high past few visits.  she says she is taking her lisinopril, which was prescribed for renal protection   Current Medications (verified): 1)  Valtrex 500 Mg  Tabs (Valacyclovir Hcl) .Marland Kitchen.. 1 Tab Daily For Suppression of Severe Attacks. 2)  Lisinopril 2.5 Mg  Tabs (Lisinopril) .... One Tablet Daily For Kidney Protection in Diabetes 3)  Glyburide 5 Mg Tabs (Glyburide) .Marland Kitchen.. 1 Tab By Mouth Daily With Food For Diabetes 4)  Metformin Hcl 1000 Mg Tabs (Metformin Hcl) .... 1/2 Tablet Daily For A Week; Then 1/2 Tab Bid For 1 Week; Then 1 Tab in Am and 1/2 At Night For 1 Weekt; Then 1 Tab Two Times A Day 5)  Bayer Childrens Aspirin 81 Mg Chew (Aspirin) .Marland Kitchen.. 1 Tab By Mouth Daily  Allergies: No Known Drug Allergies  Review of Systems General:  Denies fever. CV:  Denies chest pain or discomfort. GU:  Denies discharge, dysuria, and urinary frequency.  Physical Exam  General:   Well-developed,well-nourished,in no acute distress; alert,appropriate and cooperative throughout examination Genitalia:  Pelvic Exam:        External: normal female genitalia without lesions or masses        Vagina: normal without lesions or masses        Cervix: normal without lesions or masses; IUD in place Additional Exam:  vital signs reviewed    Impression & Recommendations:  Problem # 1:  VULVOVAGINITIS (ICD-616.10) Assessment Improved  resolved  Orders: [facility]- Est  Level 4 (56213)  Problem # 2:  CONTRACEPTIVE MANAGEMENT (ICD-V25.09) Assessment: Unchanged  IUD in place.  for now she will keep IUD  Orders: [facility]- Est  Level 4 (08657)  Problem # 3:  DIABETES MELLITUS, TYPE II (ICD-250.00) Assessment: Unchanged  unfortunately, I do not think she is very interested in or concerned about her diabetes.  I talked with her about some of the long-term consequences of poorly controlled diabetes.  advised her to take her metformin two times a day.  advised her to start her glyburide.  I printed out all of her prescriptions in hopes that this would make it more concrete for her.  rtc 1 month.  prescribed a glucometer and gave script for supplies.  she is applying for [employer]; so hopefully she can get these things.  Her updated medication list for  this problem includes:    Lisinopril 5 Mg Tabs (Lisinopril) .Marland Kitchen... 1 tab by mouth daily for blood pressure and kidney protection    Glyburide 5 Mg Tabs (Glyburide) .Marland Kitchen... 1 tab by mouth daily with food for diabetes    Metformin Hcl 1000 Mg Tabs (Metformin hcl) .Marland Kitchen... 1 tab two times a day for diabetes    Bayer Childrens Aspirin 81 Mg Chew (Aspirin) .Marland Kitchen... 1 tab by mouth daily  Orders: [facility]- Est  Level 4 (78295)  Problem # 4:  ESSENTIAL HYPERTENSION (ICD-401.9) Assessment: New  think she officially warrants hypertension diagnosis.  increase lisinopril to 5 mg.  need to recheck bmet next visit. Her updated medication list for this  problem includes:    Lisinopril 5 Mg Tabs (Lisinopril) .Marland Kitchen... 1 tab by mouth daily for blood pressure and kidney protection  Orders: [facility]- Est  Level 4 (99214)  Complete Medication List: 1)  Valtrex 500 Mg Tabs (Valacyclovir hcl) .Marland Kitchen.. 1 tab daily for suppression of severe attacks. 2)  Lisinopril 5 Mg Tabs (Lisinopril) .Marland Kitchen.. 1 tab by mouth daily for blood pressure and kidney protection 3)  Glyburide 5 Mg Tabs (Glyburide) .Marland Kitchen.. 1 tab by mouth daily with food for diabetes 4)  Metformin Hcl 1000 Mg Tabs (Metformin hcl) .Marland Kitchen.. 1 tab two times a day for diabetes 5)  Bayer Childrens Aspirin 81 Mg Chew (Aspirin) .Marland Kitchen.. 1 tab by mouth daily  Patient Instructions: 1)  It was nice to see you today. 2)  For your diabetes, take the METFORMIN two times a day AND start taking your GLYBURIDE once a day with food. 3)  Take your lisinopril every day to protect your kidneys and for your blood pressure. 4)  If you can, get a glucometer and check your sugars at least 3 times a week first thing in the morning before you eat. 5)  Please schedule a follow-up appointment in 1 month for your diabetes.  Prescriptions: GLYBURIDE 5 MG TABS (GLYBURIDE) 1 tab by mouth daily with food for diabetes  #30 x 12   Entered and Authorized by:   Asher Muir MD   Signed by:   Asher Muir MD on 08/09/2009   Method used:   Print then Give to Patient   RxID:   [id number] LISINOPRIL 5 MG TABS (LISINOPRIL) 1 tab by mouth daily for blood pressure and kidney protection  #30 x 6   Entered and Authorized by:   Asher Muir MD   Signed by:   Asher Muir MD on 08/09/2009   Method used:   Print then Give to Patient   RxID:   [id number] METFORMIN HCL 1000 MG TABS (METFORMIN HCL) 1 tab two times a day for diabetes  #60 x 12   Entered and Authorized by:   Asher Muir MD   Signed by:   Asher Muir MD on 08/09/2009   Method used:   Print then Give to Patient   RxID:   [id number]

## 2010-06-11 NOTE — Assessment & Plan Note (Signed)
Summary: female problem,tcb   Vital Signs:  Patient profile:   25 year old female Weight:      210 pounds Temp:     99.1 degrees F oral Pulse rate:   81 / minute Pulse rhythm:   regular BP sitting:   139 / 82  (left arm)  Vitals Entered By: Loralee Pacas CMA (August 02, 2009 11:00 AM) CC: female problems Pain Assessment Patient in pain? yes     Location: vagina Intensity: 7 Onset of pain  With activity   Primary Care Provider:  Asher Muir MD  CC:  female problems.  History of Present Illness: 1.  female problems--seen at [facility] on 3/16 for bite on arm and vaginal itching/irritation.  diagnosed with yeast infection.  given augmentin for bite on arm and diflucan X 2 for yeast infection.  she was subsequently found to have chlamydia as well and was treated here at [facility] with azithro.  However, her vaginal irritation worsened despite taking the diflucan.  she began to have pain and swelling.  she could feel some bumps in her perineal area.  did not see any herpetic lesions (has a hx of herpes and knows what to look for). She also had some dysuria.    No fevers, abdominal pain, continued vaginal discharge.   Was having her menses as well during this time.  On 3/19, went to ER for the above symptoms.  Diagnosed with PID, given a shot of rocephin and prescribed a course of doxy.  REpeat G/C, Chlam swabs were positive for chlamydia (which is not surprising since she had only been treated 2 days earlier).  her vulvo-vaginal pain and swelling is getting a little better since then, but still bothering her.  She was told to follow up at [facility] because they could not visualize her iud; they also told her it should be removed because of her risk of infection.  (IUD was visualized at visit on 3/16)  Allergies: No Known Drug Allergies  Review of Systems General:  Denies fatigue, fever, and malaise. GU:  Complains of dysuria; denies abnormal vaginal bleeding, discharge, and genital sores.  Physical  Exam  General:  Well-developed,well-nourished,in no acute distress; alert,appropriate and cooperative throughout examination Genitalia:  Pelvic Exam:        External: skin irritation on labia and perineal area.  cannot appreciate significant erythema.  few papules that are not painful or erythematous.  no ulcers or other suspicious lesions.  she does have some labial swelling.  labia are quite tender to touch.   speculum and bimanual exam are deferred to tenderness to touch Additional Exam:  vital signs reviewed    Impression & Recommendations:  Problem # 1:  VULVOVAGINITIS (ICD-616.10) Assessment Deteriorated  Doubt that she actually had/has PID with no abdominal pain, fever.  cervical motion tenderness was documented on her exam in the ER, but pt states that what actually hurt was her vaginal mucosa and surrounding skin.  She has now had 4 antibiotics.  I think this is a yeast infection.  I do not think it is urgent to do a pelvic exam today and find her IUD.  Will treat for yeast vaginitis and see her back in a week for a pelvic exam and to make sure her IUD is in place.  will discuss risks and benefits of leaving IUD in at that time.  Orders: [facility]- Est Level  3 (87564)  Complete Medication List: 1)  Valtrex 500 Mg Tabs (Valacyclovir hcl) .Marland KitchenMarland KitchenMarland Kitchen 1  tab daily for suppression of severe attacks. 2)  Lisinopril 2.5 Mg Tabs (Lisinopril) .... One tablet daily for kidney protection in diabetes 3)  Glyburide 5 Mg Tabs (Glyburide) .Marland Kitchen.. 1 tab by mouth daily with food for diabetes 4)  Metformin Hcl 1000 Mg Tabs (Metformin hcl) .... 1/2 tablet daily for a week; then 1/2 tab bid for 1 week; then 1 tab in am and 1/2 at night for 1 weekt; then 1 tab two times a day 5)  Bayer Childrens Aspirin 81 Mg Chew (Aspirin) .Marland Kitchen.. 1 tab by mouth daily 6)  Fluconazole 150 Mg Tabs (Fluconazole) .Marland Kitchen.. 1 tab by mouth x 1 now for yeast  Patient Instructions: 1)  It was nice to see you today. 2)  Take the fluconazole pill  today. 3)  Buy some over the counter yeast medicine (like monistat).  Use that for 7 days. 4)  Ice your vaginal area a few times per day. 5)  Please schedule a follow-up appointment in 1 week.   Prescriptions: FLUCONAZOLE 150 MG TABS (FLUCONAZOLE) 1 tab by mouth X 1 now for yeast  #1 x 0   Entered and Authorized by:   Asher Muir MD   Signed by:   Asher Muir MD on 08/02/2009   Method used:   Electronically to        [facility]  [address] [phone]* (retail)       [address]       [place], [place]  [address]       Ph: [phone] or [phone]       Fax: [phone]   RxID:   [id number]

## 2010-06-11 NOTE — Letter (Signed)
Summary: Generic Letter  [facility]  [address]   [place], [place] [address]   Phone: [phone]  Fax: [phone]    06/04/2009  Darnesha JESTER [address] [place], [place]  [address]  Dear Ms. BUGGE,      I just wanted to talk with you about your cholesterol results.  Please call me at [phone] when you have a chance.    Sincerely,   Asher Muir MD  Appended Document: Generic Letter mailed.

## 2010-06-11 NOTE — Progress Notes (Signed)
Summary: Positive Chlamydia  Phone Note Outgoing Call Call back at [facility] Phone [phone]   Call placed by: Romero Belling MD,  July 26, 2009 10:45 AM Call placed to: Patient Summary of Call: Called to inform of positive Chlamydia.  Patient not home.  Person on other end didn't have alternative number for her.  Not sure when she would be back.  Asked her to have Ms. Market call my office today.  Will also send letter.

## 2010-06-11 NOTE — Assessment & Plan Note (Signed)
Summary: removal of iud,tcb   Vital Signs:  Patient profile:   25 year old female Weight:      206 pounds Temp:     98.5 degrees F oral Pulse rate:   80 / minute BP sitting:   122 / 81  (right arm) Cuff size:   large  Vitals Entered By: Jimmy Footman, CMA (January 07, 2010 8:36 AM) CC: IUD removal Is Patient Diabetic? No Pain Assessment Patient in pain? no        Primary Provider:  Lloyd Huger MD  CC:  IUD removal.  History of Present Illness: 1. Birth control--pt would like removal of IUD due to repeated 'vaginal infections.' She states that she has been to the ED and treated for bacterial infection, and she was advised to have the device removed. Has had hx of chlamydia x2 treated at [facility]. Wants to restart pill.  2. Vaginal discharge--has white clumpy discharge and itchiness for about 1 week. No abdominal/vaginal pain or fever or abnormal bleeding.   Problems Prior to Update: 1)  Essential Hypertension  (ICD-401.9) 2)  Vulvovaginitis  (ICD-616.10) 3)  Chlamydial Infection  (ICD-099.41) 4)  HIV Exposure, Possible  (ICD-V15.89) 5)  Candidiasis, Vaginal  (ICD-112.1) 6)  Human Bite  (ICD-E968.8) 7)  Irregular Menses  (ICD-626.4) 8)  Hx of Chlamydtrachomatis Infection Lower Gu Sites  (ICD-099.53) 9)  Health Maintenance Exam  (ICD-V70.0) 10)  Routine Gynecological Examination  (ICD-V72.31) 11)  Screening For Malignant Neoplasm of The Cervix  (ICD-V76.2) 12)  Iud  (ICD-V25.1) 13)  Contact With or Exposure To Venereal Diseases  (ICD-V01.6) 14)  ? of Asthma  (ICD-493.90) 15)  Diabetes Mellitus, Type II  (ICD-250.00) 16)  Contraceptive Management  (ICD-V25.09) 17)  Herpes Simplex  (ICD-054.9) 18)  Rhinitis, Allergic  (ICD-477.9) 19)  Hx of Papanicolaou Smear, Abnormal  (ICD-795.0) 20)  Obesity, Nos  (ICD-278.00)  Medications Prior to Update: 1)  Valtrex 500 Mg  Tabs (Valacyclovir Hcl) .Marland Kitchen.. 1 Tab Daily For Suppression of Severe Attacks. 2)  Lisinopril 5 Mg Tabs (Lisinopril)  .Marland Kitchen.. 1 Tab By Mouth Daily For Blood Pressure and Kidney Protection 3)  Glyburide 5 Mg Tabs (Glyburide) .Marland Kitchen.. 1 Tab By Mouth Daily With Food For Diabetes 4)  Metformin Hcl 1000 Mg Tabs (Metformin Hcl) .Marland Kitchen.. 1 Tab Two Times A Day For Diabetes 5)  Bayer Childrens Aspirin 81 Mg Chew (Aspirin) .Marland Kitchen.. 1 Tab By Mouth Daily  Allergies: No Known Drug Allergies  Past History:  Past Medical History: Pt has a questionable history of asthma genital herpes.  Pt has a history of allergic rhinitis  diabetes mellitus type 2.  Family History: Reviewed history from 12/07/2008 and no changes required. paternal uncle--colon cancer father dm  Social History: Reviewed history from 12/07/2008 and no changes required. 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  She does not drink, smoke or consume alcohol.  She is sexually active with men 1 partners in the last year.   She uses an IUD for birth control, and they use condoms 25% of the time.  She does not exercise.  but does walk around the schoo  Review of Systems  The patient denies anorexia, fever, and abnormal bleeding.    Physical Exam  General:  Well-developed,well-nourished,in no acute distress; alert,appropriate and cooperative throughout examination Head:  normocephalic and atraumatic.   Abdomen:  Bowel sounds positive,abdomen soft and non-tender without masses, organomegaly or hernias noted. Genitalia:  Normal introitus for age, no external lesions, no vaginal  discharge, mucosa pink and moist, no vaginal or cervical lesions, no vaginal atrophy, no friaility or hemorrhage, normal uterus size and position, no adnexal masses or tenderness. IUD strings present at cervical os.    Impression & Recommendations:  Problem # 1:  CONTRACEPTIVE MANAGEMENT (ICD-V25.09)  Removed Mirena IUD since it had been in place for 4 years and patient requested it. We discussed that many infections including chlamydia are not protected against by birth control  with the exception of condoms. Patient expressed understanding. She will transition back to OCPs, which she has taken in the past without significant side effects. Counseled to start her pills today and take them consistently for adequate contraception.   Orders: IUD removal -[facility] (37628) [facility]- Est Level  3 (31517)  Problem # 2:  VAGINAL DISCHARGE (ICD-623.5) Wet prep negative for clue cells, trich, yeast. GC/Chl are pending. Will call patient with results.   Complete Medication List: 1)  Valtrex 500 Mg Tabs (Valacyclovir hcl) .Marland Kitchen.. 1 tab daily for suppression of severe attacks. 2)  Lisinopril 5 Mg Tabs (Lisinopril) .Marland Kitchen.. 1 tab by mouth daily for blood pressure and kidney protection 3)  Glyburide 5 Mg Tabs (Glyburide) .Marland Kitchen.. 1 tab by mouth daily with food for diabetes 4)  Metformin Hcl 1000 Mg Tabs (Metformin hcl) .Marland Kitchen.. 1 tab two times a day for diabetes 5)  Bayer Childrens Aspirin 81 Mg Chew (Aspirin) .Marland Kitchen.. 1 tab by mouth daily 6)  Sprintec 28 0.25-35 Mg-mcg Tabs (Norgestimate-eth estradiol) .... Take one pill by mouth daily at the same time everyday.  [facility]: [phone] GC/Chlamydia-[facility] ([id number])  Patient Instructions: 1)  Nice to meet you today. 2)  I will call if you if test results are positive. 3)  Please start birth control pills today. 4)  Condoms and abstinence are the safest ways to avoid sexually transmitted infections. 5)  Schedule an appointment in 3 months or sooner if needed.  Prescriptions: SPRINTEC 28 0.25-35 MG-MCG TABS (NORGESTIMATE-ETH ESTRADIOL) take one pill by mouth daily at the same time everyday.  #28 x 3   Entered and Authorized by:   Lloyd Huger MD   Signed by:   Lloyd Huger MD on 01/07/2010   Method used:   Electronically to        [facility]  [address] [phone]* (retail)       [address]       [place], [place]  [address]       Ph: [phone] or [phone]       Fax: [phone]   RxID:   [id number]   Laboratory Results   Date/Time Received: January 07, 2010 9:10 AM  Date/Time Reported: January 07, 2010 9:19 AM   [employer] Source: vaginal WBC/hpf: 1-5 Bacteria/hpf: 2+  Rods Clue cells/hpf: none  Negative whiff Yeast/hpf: none Trichomonas/hpf: none Comments: ...........test performed by...........Marland KitchenTerese Door, CMA    Appended Document: removal of iud,tcb     Allergies: No Known Drug Allergies   Complete Medication List: 1)  Valtrex 500 Mg Tabs (Valacyclovir hcl) .Marland Kitchen.. 1 tab daily for suppression of severe attacks. 2)  Lisinopril 5 Mg Tabs (Lisinopril) .Marland Kitchen.. 1 tab by mouth daily for blood pressure and kidney protection 3)  Glyburide 5 Mg Tabs (Glyburide) .Marland Kitchen.. 1 tab by mouth daily with food for diabetes 4)  Metformin Hcl 1000 Mg Tabs (Metformin hcl) .Marland Kitchen.. 1 tab two times a day for diabetes 5)  Bayer Childrens Aspirin 81 Mg Chew (Aspirin) .Marland Kitchen.. 1 tab by mouth daily  6)  Sprintec 28 0.25-35 Mg-mcg Tabs (Norgestimate-eth estradiol) .... Take one pill by mouth daily at the same time everyday.  Other Orders: IUD removal -[facility] ([id number]) [facility]- Est Level  3 (13244)

## 2010-06-11 NOTE — Assessment & Plan Note (Signed)
Summary: menses prob,df   Vital Signs:  Patient profile:   25 year old female Weight:      210.7 pounds Temp:     99.1 degrees F oral Pulse rate:   88 / minute BP sitting:   118 / 82 Cuff size:   large  Vitals Entered By: Loralee Pacas CMA (May 18, 2009 4:13 PM) CC: irregular menses, dm Comments her periods have been going away and coming back over the last 2 months. pt states that she is having protected sex.   Primary Care Provider:  Asher Muir MD  CC:  irregular menses and dm.  History of Present Illness: 1.  irregular menses--for the past year.  sometimes bleeds 20 days per month.  not particularly heavy.  this started when she had her mirena put in about 1 year ago.  it doesn't really bother her; she just wanted to make sure nothing was wrong.    2.  dm--A1C 8.8 in July.  added glyburide. I thought pt was on janumet, but she tells me that Dr Karn Pickler stopped that a long time ago.  did not come back for follow up.  so, now, apparently, she is only taking glyburide.    Current Medications (verified): 1)  Valtrex 500 Mg  Tabs (Valacyclovir Hcl) .Marland Kitchen.. 1 Tab Daily For Suppression of Severe Attacks. 2)  Lisinopril 2.5 Mg  Tabs (Lisinopril) .... One Tablet Daily For Kidney Protection in Diabetes 3)  Glyburide 5 Mg Tabs (Glyburide) .Marland Kitchen.. 1 Tab By Mouth Daily With Food For Diabetes  Allergies: No Known Drug Allergies  Past History:  Past Medical History: Pt has a questionable history of asthma genital herpes.  Pt has a history of allergic rhinitis  diabetes mellitus type 2. has mirena   Physical Exam  General:  alert, well-developed,obese, and in no apparent distress  Abdomen:  abdomen obese with stretch marks present, bowel sounds hypoactive no ttp Additional Exam:  vital signs reviewed    Impression & Recommendations:  Problem # 1:  IRREGULAR MENSES (ICD-626.4) Assessment New  this has been going on for one year, since she got the mirena.  I think it is from  the mirena.  no red flags.  she is happy with the mirena and does not want to change.  I agree.  did advise her to check the strings monthly  Orders: [facility]- Est Level  3 (40347)  Problem # 2:  DIABETES MELLITUS, TYPE II (ICD-250.00) Assessment: Unchanged was not taking janumet.  needs dm appt.  check a1C today and follow up in the next few weeks.  she also plans to start exercising again. The following medications were removed from the medication list:    Janumet 50-1000 Mg Tabs (Sitagliptin-metformin hcl) ..... One tablet twice a day for diabetes control.  stop taking your metformin. Her updated medication list for this problem includes:    Lisinopril 2.5 Mg Tabs (Lisinopril) ..... One tablet daily for kidney protection in diabetes    Glyburide 5 Mg Tabs (Glyburide) .Marland Kitchen... 1 tab by mouth daily with food for diabetes  Orders: A1C-[facility] (42595) [facility]- Est Level  3 (63875)  Complete Medication List: 1)  Valtrex 500 Mg Tabs (Valacyclovir hcl) .Marland Kitchen.. 1 tab daily for suppression of severe attacks. 2)  Lisinopril 2.5 Mg Tabs (Lisinopril) .... One tablet daily for kidney protection in diabetes 3)  Glyburide 5 Mg Tabs (Glyburide) .Marland Kitchen.. 1 tab by mouth daily with food for diabetes  Other Orders: U Preg-[facility] ([id number])  Patient  Instructions: 1)  It was nice to see you today. 2)  I think you are bleeding because of your Mirena.  It is OK as long as it doesn't bother you. 3)  Please make an appointment with me in the next few weeks to talk about your diabetes.  We'll check your A1C today.  Laboratory Results   Urine Tests  Date/Time Received: May 18, 2009 4:06 PM  Date/Time Reported: May 18, 2009 4:13 PM     Urine HCG: negative Comments: ...........test performed by...........Marland KitchenTerese Door, CMA   Blood Tests   Date/Time Received: May 18, 2009 4:26PM  Date/Time Reported: May 18, 2009 4:52 PM   HGBA1C: 8.9%   (Normal Range: Non-Diabetic - 3-6%   Control Diabetic - 6-8%)   Comments: ...........test performed by...........Marland KitchenTerese Door, CMA

## 2010-06-11 NOTE — Letter (Signed)
Summary: Positive Chlamydia  [facility]  [address]   [place], [place] [address]   Phone: [phone]  Fax: [phone]    07/26/2009  [address] [place], [place]  [address]  Dear Ms. FINFROCK,  I attempted to call you at home with the results of your recent tests but was unable to reach you.  The following are the results of your recent test(s):  Chlamydia -- POSITIVE Gonorrhea -- negative HIV -- negative  It is important that you call my office immediately to schedule a nurse visit to come in for treatment of Chlamydia.  This is a sexually transmitted disease, so you should use condoms until you are treated.  Your partner(s) should also be treated.  Sincerely, Romero Belling MD [facility]           Appended Document: Positive Chlamydia mailed.

## 2010-06-11 NOTE — Miscellaneous (Signed)
  Clinical Lists Changes  Problems: Removed problem of CONTRACEPTIVE MANAGEMENT (ICD-V25.09) Removed problem of History of  CHLAMYDTRACHOMATIS INFECTION LOWER GU SITES (ICD-099.53) Removed problem of HEALTH MAINTENANCE EXAM (ICD-V70.0) Removed problem of SCREENING FOR MALIGNANT NEOPLASM OF THE CERVIX (ICD-V76.2) Removed problem of INTRAUTERINE CONTRACEPTIVE DEVICE REMOVAL (ICD-V25.42)

## 2010-06-13 NOTE — Assessment & Plan Note (Signed)
Summary: SPRAINED LF WRIST & YEAST INFECTION/[facility]   Vital Signs:  Patient profile:   25 year old female Weight:      205.3 pounds Pulse rate:   84 / minute BP sitting:   129 / 82  (right arm)  Vitals Entered By: Arlyss Repress CMA, (April 29, 2010 11:38 AM) CC: vag d/c x 1 mos. sharp pain left wrist x 1 day after picking up heavy box Is Patient Diabetic? No Pain Assessment Patient in pain? no        Primary Care Provider:  Lloyd Huger MD  CC:  vag d/c x 1 mos. sharp pain left wrist x 1 day after picking up heavy box.  History of Present Illness: Works at [employer] and picked up a bike when working in toys with her left hand and hurt her wrist.  She did not file a report.  She has itching in her vaginal and took some OTC oral product.  Habits & Providers  Alcohol-Tobacco-Diet     Tobacco Status: never     Passive Smoke Exposure: no  Allergies: No Known Drug Allergies  Social History: Passive Smoke Exposure:  no  Physical Exam  General:  Well-developed,well-nourished,in no acute distress; alert,appropriate and cooperative throughout examination Genitalia:  Normal introitus for age, no external lesions, no vaginal discharge, mucosa pink and moist, no vaginal or cervical lesions, no vaginal atrophy, no friaility or hemorrhage, normal uterus size and position, no adnexal masses or tenderness Wet prep_few yeast Msk:  left wrist with tenderness with hyperflexion and hypoflexion, stable joint, no swelling.   Impression & Recommendations:  Problem # 1:  VAGINITIS, CANDIDAL (ICD-112.1)  Her updated medication list for this problem includes:    Fluconazole 150 Mg Tabs (Fluconazole) ..... One pill once  Orders: [facility]- Est Level  3 (16109)  Problem # 2:  WRIST PAIN, LEFT (ICD-719.43)  minor trauma with sprain, recommended not to lift anything heavy for one week with left wrist and purchase a brace for support, return if not getting better in one week.  Orders: [facility]- Est  Level  3 (60454)  Complete Medication List: 1)  Valtrex 500 Mg Tabs (Valacyclovir hcl) .Marland Kitchen.. 1 tab daily for suppression of severe attacks. 2)  Lisinopril 5 Mg Tabs (Lisinopril) .Marland Kitchen.. 1 tab by mouth daily for blood pressure and kidney protection 3)  Metformin Hcl 1000 Mg Tabs (Metformin hcl) .Marland Kitchen.. 1 tab two times a day for diabetes 4)  Bayer Childrens Aspirin 81 Mg Chew (Aspirin) .Marland Kitchen.. 1 tab by mouth daily 5)  Sprintec 28 0.25-35 Mg-mcg Tabs (Norgestimate-eth estradiol) .... Take one tab daily by mouth every 28 days. 6)  Left Wrist Brace  .... To support wrist sprain 7)  Fluconazole 150 Mg Tabs (Fluconazole) .... One pill once  [id number]  Patient Instructions: 1)  Please schedule a follow-up appointment as needed .  Prescriptions: FLUCONAZOLE 150 MG TABS (FLUCONAZOLE) one pill once  #1 x 0   Entered and Authorized by:   Luretha Murphy NP   Signed by:   Luretha Murphy NP on 04/29/2010   Method used:   Electronically to        [facility] [address] (retail)       [phone]       [place], [place]  [address]       Ph: [phone]       Fax: [phone]   RxID:  [id number] LEFT WRIST BRACE to support wrist sprain  #1 x 0   Entered and Authorized by:   Luretha Murphy NP   Signed by:   Luretha Murphy NP on 04/29/2010   Method used:   Print then Give to Patient   RxID:   [id number]    Orders Added: 1)  Wet Prep- [facility] [[id number]] 2)  [facility]- Est Level  3 [[id number]]    Laboratory Results  Date/Time Received: April 29, 2010 11:50 AM  Date/Time Reported: April 29, 2010 12:00 PM   Wet [place] Source: vaginal WBC/hpf: 1-5 Bacteria/hpf: 3+  Rods Clue cells/hpf: none  Negative whiff Yeast/hpf: few Trichomonas/hpf: none Comments: ...........test performed by...........Marland KitchenTerese Door, CMA

## 2010-06-29 ENCOUNTER — Emergency Department (HOSPITAL_COMMUNITY)
Admission: EM | Admit: 2010-06-29 | Discharge: 2010-06-29 | Disposition: A | Discharge status: Home / Self Care | Payer: Medicaid Other | Attending: Emergency Medicine | Admitting: Emergency Medicine

## 2010-06-29 ENCOUNTER — Emergency Department (HOSPITAL_COMMUNITY): Payer: Medicaid Other

## 2010-06-29 DIAGNOSIS — E119 Type 2 diabetes mellitus without complications: Secondary | ICD-10-CM | POA: Insufficient documentation

## 2010-06-29 DIAGNOSIS — O2390 Unspecified genitourinary tract infection in pregnancy, unspecified trimester: Secondary | ICD-10-CM | POA: Insufficient documentation

## 2010-06-29 DIAGNOSIS — N390 Urinary tract infection, site not specified: Secondary | ICD-10-CM | POA: Insufficient documentation

## 2010-06-29 DIAGNOSIS — O99891 Other specified diseases and conditions complicating pregnancy: Secondary | ICD-10-CM | POA: Insufficient documentation

## 2010-06-29 DIAGNOSIS — R109 Unspecified abdominal pain: Secondary | ICD-10-CM | POA: Insufficient documentation

## 2010-06-29 DIAGNOSIS — O210 Mild hyperemesis gravidarum: Secondary | ICD-10-CM | POA: Insufficient documentation

## 2010-06-29 DIAGNOSIS — O24919 Unspecified diabetes mellitus in pregnancy, unspecified trimester: Secondary | ICD-10-CM | POA: Insufficient documentation

## 2010-06-29 LAB — DIFFERENTIAL
Eosinophils Absolute: 0.1 10*3/uL (ref 0.0–0.7)
Eosinophils Relative: 1 % (ref 0–5)
Lymphocytes Relative: 31 % (ref 12–46)
Lymphs Abs: 2.9 10*3/uL (ref 0.7–4.0)
Monocytes Relative: 6 % (ref 3–12)

## 2010-06-29 LAB — POCT I-STAT, CHEM 8
BUN: 5 mg/dL — ABNORMAL LOW (ref 6–23)
Calcium, Ion: 1.22 mmol/L (ref 1.12–1.32)
Chloride: 100 mEq/L (ref 96–112)
Creatinine, Ser: 0.6 mg/dL (ref 0.4–1.2)
Glucose, Bld: 233 mg/dL — ABNORMAL HIGH (ref 70–99)
HCT: 40 % (ref 36.0–46.0)
Potassium: 3.7 mEq/L (ref 3.5–5.1)

## 2010-06-29 LAB — CBC
HCT: 37.5 % (ref 36.0–46.0)
MCH: 25.6 pg — ABNORMAL LOW (ref 26.0–34.0)
MCV: 80 fL (ref 78.0–100.0)
Platelets: 388 10*3/uL (ref 150–400)
RDW: 14.4 % (ref 11.5–15.5)
WBC: 9.1 10*3/uL (ref 4.0–10.5)

## 2010-06-29 LAB — URINALYSIS, ROUTINE W REFLEX MICROSCOPIC
Bilirubin Urine: NEGATIVE
Hgb urine dipstick: NEGATIVE
Protein, ur: NEGATIVE mg/dL
Urobilinogen, UA: 1 mg/dL (ref 0.0–1.0)

## 2010-07-16 ENCOUNTER — Telehealth: Payer: Self-pay | Admitting: Family Medicine

## 2010-07-16 NOTE — Telephone Encounter (Signed)
NOB appts scheduled as follows:   Lab 07/23/2010 @ 2pm Dr. Cristal Ford 07/30/2010 @ 2pm.  Pt notified of appointment dates and times.  Ileana Ladd

## 2010-07-16 NOTE — Telephone Encounter (Signed)
Pt seen at [facility] long emergency room, positive pregnancy test, is aprox 2 almost 3 mos pregnant, needs labs & new ob appt.

## 2010-07-23 ENCOUNTER — Other Ambulatory Visit: Payer: Self-pay | Admitting: Family Medicine

## 2010-07-23 ENCOUNTER — Other Ambulatory Visit: Payer: Medicaid Other

## 2010-07-23 DIAGNOSIS — Z3480 Encounter for supervision of other normal pregnancy, unspecified trimester: Secondary | ICD-10-CM

## 2010-07-23 LAB — CONVERTED CEMR LAB
Basophils Absolute: 0 10*3/uL (ref 0.0–0.1)
Eosinophils Relative: 1 % (ref 0–5)
HCT: 35 % — ABNORMAL LOW (ref 36.0–46.0)
HIV: NONREACTIVE
Hepatitis B Surface Ag: NEGATIVE
Lymphocytes Relative: 29 % (ref 12–46)
Lymphs Abs: 2 10*3/uL (ref 0.7–4.0)
Neutro Abs: 4.6 10*3/uL (ref 1.7–7.7)
Platelets: 346 10*3/uL (ref 150–400)
RBC: 4.33 M/uL (ref 3.87–5.11)
RDW: 15.3 % (ref 11.5–15.5)
Rh Type: NEGATIVE
Rubella: 85.6 intl units/mL — ABNORMAL HIGH
WBC: 7.1 10*3/uL (ref 4.0–10.5)

## 2010-07-24 LAB — OBSTETRIC PANEL
Basophils Relative: 0 % (ref 0–1)
Eosinophils Absolute: 0.1 10*3/uL (ref 0.0–0.7)
Eosinophils Relative: 1 % (ref 0–5)
Hemoglobin: 11.1 g/dL — ABNORMAL LOW (ref 12.0–15.0)
Lymphs Abs: 2 10*3/uL (ref 0.7–4.0)
MCH: 25.6 pg — ABNORMAL LOW (ref 26.0–34.0)
MCHC: 31.7 g/dL (ref 30.0–36.0)
MCV: 80.8 fL (ref 78.0–100.0)
Monocytes Relative: 6 % (ref 3–12)
Platelets: 346 10*3/uL (ref 150–400)
RBC: 4.33 MIL/uL (ref 3.87–5.11)
Rh Type: NEGATIVE

## 2010-07-24 LAB — HIV ANTIBODY (ROUTINE TESTING W REFLEX): HIV: NONREACTIVE

## 2010-07-24 LAB — PRENATAL ANTIBODY IDENTIFICATION

## 2010-07-24 LAB — SICKLE CELL SCREEN: Sickle Cell Screen: NEGATIVE

## 2010-07-25 LAB — STREP B DNA PROBE: GBS: POSITIVE

## 2010-07-25 LAB — CULTURE, OB URINE: Colony Count: >=100000

## 2010-07-27 LAB — WET PREP, GENITAL
Trich, Wet Prep: NONE SEEN
Yeast Wet Prep HPF POC: NONE SEEN

## 2010-07-27 LAB — URINALYSIS, ROUTINE W REFLEX MICROSCOPIC
Glucose, UA: >1000 mg/dL — AB
Hgb urine dipstick: NEGATIVE
Specific Gravity, Urine: 1.031 — ABNORMAL HIGH (ref 1.005–1.030)
pH: 6 (ref 5.0–8.0)

## 2010-07-27 LAB — URINE MICROSCOPIC-ADD ON

## 2010-07-27 LAB — POCT PREGNANCY, URINE: Preg Test, Ur: NEGATIVE

## 2010-07-30 ENCOUNTER — Encounter: Payer: Self-pay | Admitting: Family Medicine

## 2010-07-30 ENCOUNTER — Telehealth: Payer: Self-pay | Admitting: Family Medicine

## 2010-07-30 DIAGNOSIS — O24919 Unspecified diabetes mellitus in pregnancy, unspecified trimester: Secondary | ICD-10-CM

## 2010-07-30 DIAGNOSIS — B951 Streptococcus, group B, as the cause of diseases classified elsewhere: Secondary | ICD-10-CM | POA: Insufficient documentation

## 2010-07-30 MED ORDER — PENICILLIN V POTASSIUM 500 MG PO TABS: 500.0000 mg | ORAL_TABLET | Freq: Three times a day (TID) | ORAL | Status: AC

## 2010-07-30 NOTE — Assessment & Plan Note (Signed)
Plan to treat with PCN for asymptomatic bacteriuria. Patient no-showed for new OB visit today. Left message stating need for treatment and test of cure.

## 2010-07-30 NOTE — Telephone Encounter (Signed)
Patient recently seen in ED and discovered unplanned pregnancy. Poorly compliant diabetic. Have left several messages in months preceding pregnancy about following up in clinic due to diabetes. Was taking only metformin, but likely needed more than this prior to conception. Again no-showed in clinic for new OB visit today. Definitely needs referral to High risk clinic, which we will make today. Awaiting patient to call clinic regarding this referral and needs penicillin to treat GBS bacteriuria.

## 2010-07-31 NOTE — Telephone Encounter (Signed)
Called patient again and she denies receiving previous messages, did not know she had an appointment yesterday. She has moved to [place], [place] this week and found a new OB physician and has appointment tomorrow. She is unsure of doctor's name or practice name.  I requested a phone number or fax number to send our clinic records and labs for continuity of care. She agrees to pick up prescription for penicillin if we can call it in to a pharmacy in [place]. I am awaiting her return call for clinic phone number and pharmacy name.

## 2010-07-31 NOTE — Telephone Encounter (Signed)
I agree with Dr. Ernest Haber plan.  May need to send patient a registered/certified letter to confirm her receipt of this extremely important information.

## 2010-08-01 NOTE — Telephone Encounter (Signed)
Patient has established care at [facility] in [place]. Have requested records be faxed to that office and they have agreed to send necessary prescriptions for GBS bacteruria. Please cancel our referral to [facility].

## 2010-08-02 LAB — WET PREP, GENITAL
Trich, Wet Prep: NONE SEEN
Yeast Wet Prep HPF POC: NONE SEEN

## 2010-08-02 LAB — URINALYSIS, ROUTINE W REFLEX MICROSCOPIC
Bilirubin Urine: NEGATIVE
Protein, ur: 30 mg/dL — AB
Urobilinogen, UA: 1 mg/dL (ref 0.0–1.0)

## 2010-08-02 LAB — PREGNANCY, URINE: Preg Test, Ur: NEGATIVE

## 2010-08-15 LAB — URINALYSIS, ROUTINE W REFLEX MICROSCOPIC
Nitrite: NEGATIVE
Specific Gravity, Urine: 1.025 (ref 1.005–1.030)
Urobilinogen, UA: 1 mg/dL (ref 0.0–1.0)

## 2010-08-15 LAB — URINE MICROSCOPIC-ADD ON

## 2010-08-15 LAB — GLUCOSE, CAPILLARY

## 2010-08-15 LAB — PREGNANCY, URINE: Preg Test, Ur: NEGATIVE

## 2010-08-18 LAB — URINE MICROSCOPIC-ADD ON

## 2010-08-18 LAB — COMPREHENSIVE METABOLIC PANEL
ALT: 27 U/L (ref 0–35)
AST: 18 U/L (ref 0–37)
CO2: 25 mEq/L (ref 19–32)
Chloride: 104 mEq/L (ref 96–112)
GFR calc Af Amer: >60 mL/min (ref >60)
GFR calc non Af Amer: >60 mL/min (ref >60)
Sodium: 137 mEq/L (ref 135–145)
Total Bilirubin: 0.6 mg/dL (ref 0.3–1.2)

## 2010-08-18 LAB — URINALYSIS, ROUTINE W REFLEX MICROSCOPIC
Bilirubin Urine: NEGATIVE
Hgb urine dipstick: NEGATIVE
Protein, ur: NEGATIVE mg/dL
Urobilinogen, UA: 1 mg/dL (ref 0.0–1.0)

## 2010-09-12 ENCOUNTER — Encounter: Payer: Self-pay | Admitting: Family Medicine

## 2010-09-12 ENCOUNTER — Ambulatory Visit (INDEPENDENT_AMBULATORY_CARE_PROVIDER_SITE_OTHER): Payer: Medicaid Other | Admitting: Family Medicine

## 2010-09-12 ENCOUNTER — Encounter: Payer: Medicaid Other | Admitting: Family Medicine

## 2010-09-12 VITALS — BP 116/62 | Temp 98.5°F | Wt 209.0 lb

## 2010-09-12 DIAGNOSIS — O24919 Unspecified diabetes mellitus in pregnancy, unspecified trimester: Secondary | ICD-10-CM

## 2010-09-12 DIAGNOSIS — Z3480 Encounter for supervision of other normal pregnancy, unspecified trimester: Secondary | ICD-10-CM

## 2010-09-12 LAB — GC/CHLAMYDIA PROBE AMP, GENITAL: Chlamydia: NEGATIVE

## 2010-09-12 LAB — POCT URINALYSIS DIPSTICK
Bilirubin, UA: NEGATIVE
Glucose, UA: NEGATIVE
Ketones, UA: NEGATIVE
Leukocytes, UA: NEGATIVE
pH, UA: 8

## 2010-09-12 NOTE — Progress Notes (Signed)
25 yo G2P0010 with uncontrolled type II DM and chronic hypertension. Presented late to [facility] with unplanned pregnancy and last pre-pregnancy A1c was 10.8. Pt moved to [place] for few weeks and established initial care at ~15 weeks and had 3 visits at [facility] clinic. Has now moved back to [place]. States that she was treated for gonorrhea and GBS+ UTI while in [place], awaiting records to be forwarded. Taking glyburide 5mg  BID for diabetes. Brings glucometer with values 258-101 but takes CBGs irregularly.  Will test for cure of GC and UTI today with urine studies. Anatomy scan and quad screen ordered today. Referral to high risk for chronic DM and HTN.  Has hx of HSV and may need suppressive rx.

## 2010-09-12 NOTE — Patient Instructions (Signed)
Nice to see you again. You will follow up at High Risk Clinic at [facility]. If you have any bleeding or emergencies, go to [facility]. You will be called about your ultrasound. I will call if your lab tests are not normal. Continue checking your blood sugar and take log to your next clinic appointment.

## 2010-09-13 LAB — GC/CHLAMYDIA PROBE AMP, URINE
Chlamydia, Swab/Urine, PCR: NEGATIVE
GC Probe Amp, Urine: POSITIVE — AB

## 2010-09-14 LAB — CULTURE, OB URINE

## 2010-09-16 ENCOUNTER — Telehealth: Payer: Self-pay | Admitting: *Deleted

## 2010-09-16 NOTE — Telephone Encounter (Signed)
Spoke with patient and informed of below. She will come in for treatment

## 2010-09-16 NOTE — Telephone Encounter (Signed)
Message copied by Jimmy Footman on Mon Sep 16, 2010 10:10 AM ------      Message from: Lloyd Huger      Created: Sat Sep 14, 2010  2:48 PM      Regarding: Needs  treatment       [name] [name] has gonorrhea on her urine screen. She was treated previously, but still has infection. Please have her come in for Rocephin 250mg  IM and Azithromycin 2gm PO x1. Thanks.

## 2010-09-16 NOTE — Telephone Encounter (Signed)
LM for patient to call back to inform of below 

## 2010-09-17 ENCOUNTER — Ambulatory Visit (INDEPENDENT_AMBULATORY_CARE_PROVIDER_SITE_OTHER): Payer: Medicaid Other | Admitting: *Deleted

## 2010-09-17 DIAGNOSIS — A549 Gonococcal infection, unspecified: Secondary | ICD-10-CM

## 2010-09-17 DIAGNOSIS — A5400 Gonococcal infection of lower genitourinary tract, unspecified: Secondary | ICD-10-CM

## 2010-09-17 MED ORDER — CEFTRIAXONE SODIUM 250 MG IJ SOLR
250.0000 mg | Freq: Once | INTRAMUSCULAR | Status: AC
  Administered 2010-09-17: 250 mg via INTRAMUSCULAR

## 2010-09-17 MED ORDER — AZITHROMYCIN 1 G PO PACK
1.0000 g | PACK | Freq: Once | ORAL | Status: AC
  Administered 2010-09-17: 1 g via ORAL

## 2010-09-17 NOTE — Progress Notes (Signed)
Faxed STD treatment form to health department

## 2010-09-18 ENCOUNTER — Telehealth: Payer: Self-pay | Admitting: Family Medicine

## 2010-09-18 NOTE — Telephone Encounter (Signed)
[name] [name] is leaving info from [facility] for review.  [facility] Clinic  [phone].   Fax# [phone].

## 2010-09-19 ENCOUNTER — Telehealth: Payer: Self-pay | Admitting: *Deleted

## 2010-09-19 NOTE — Telephone Encounter (Signed)
Where are these records located? We need to fax them to [facility] as [place] is now a High risk clinic patient.

## 2010-09-19 NOTE — Telephone Encounter (Signed)
[name] [name] she need you to contact the place in [place] to have them send the info to [place].

## 2010-09-19 NOTE — Telephone Encounter (Signed)
Attempted to call patient to inform her of the high risk clinic appointment @ [facility] clinics. I received a message stating that "the message system cannot process the call at this time". Will attempt again later. Received a fax today informing of her high risk clinic appointment @ [facility] clinic. 09/26/2010 @ 9am. Patient to arrive 10 minutes early and bring insurance card @ ID with her to visit.

## 2010-09-23 NOTE — Telephone Encounter (Signed)
Patient called back and I informed her of the  Appointment.

## 2010-09-23 NOTE — Telephone Encounter (Signed)
Called patient and received a message that not available due to phone being off or out of the area. Will try again later.[name] [name]

## 2010-09-26 ENCOUNTER — Other Ambulatory Visit: Payer: Self-pay | Admitting: Obstetrics & Gynecology

## 2010-09-26 DIAGNOSIS — O99810 Abnormal glucose complicating pregnancy: Secondary | ICD-10-CM

## 2010-09-26 DIAGNOSIS — Z331 Pregnant state, incidental: Secondary | ICD-10-CM

## 2010-09-26 LAB — POCT URINALYSIS DIP (DEVICE)
Glucose, UA: 100 mg/dL — AB
Hgb urine dipstick: NEGATIVE
Nitrite: NEGATIVE
Protein, ur: NEGATIVE mg/dL
Urobilinogen, UA: 0.2 mg/dL (ref 0.0–1.0)
pH: 6.5 (ref 5.0–8.0)

## 2010-09-30 ENCOUNTER — Other Ambulatory Visit: Payer: Self-pay | Admitting: Family Medicine

## 2010-09-30 DIAGNOSIS — Z0489 Encounter for examination and observation for other specified reasons: Secondary | ICD-10-CM

## 2010-09-30 DIAGNOSIS — O169 Unspecified maternal hypertension, unspecified trimester: Secondary | ICD-10-CM

## 2010-09-30 DIAGNOSIS — O24919 Unspecified diabetes mellitus in pregnancy, unspecified trimester: Secondary | ICD-10-CM

## 2010-09-30 DIAGNOSIS — Z331 Pregnant state, incidental: Secondary | ICD-10-CM

## 2010-09-30 LAB — POCT URINALYSIS DIP (DEVICE)
Hgb urine dipstick: NEGATIVE
Nitrite: NEGATIVE
Protein, ur: NEGATIVE mg/dL
Urobilinogen, UA: 1 mg/dL (ref 0.0–1.0)
pH: 7 (ref 5.0–8.0)

## 2010-10-08 ENCOUNTER — Ambulatory Visit (HOSPITAL_COMMUNITY)
Admission: RE | Admit: 2010-10-08 | Discharge: 2010-10-08 | Disposition: A | Discharge status: Home / Self Care | Payer: Medicaid Other | Source: Ambulatory Visit | Attending: Family Medicine | Admitting: Family Medicine

## 2010-10-08 ENCOUNTER — Other Ambulatory Visit: Payer: Self-pay | Admitting: Family Medicine

## 2010-10-08 DIAGNOSIS — Z0489 Encounter for examination and observation for other specified reasons: Secondary | ICD-10-CM

## 2010-10-08 DIAGNOSIS — E669 Obesity, unspecified: Secondary | ICD-10-CM | POA: Insufficient documentation

## 2010-10-08 DIAGNOSIS — IMO0002 Reserved for concepts with insufficient information to code with codable children: Secondary | ICD-10-CM

## 2010-10-08 DIAGNOSIS — O358XX0 Maternal care for other (suspected) fetal abnormality and damage, not applicable or unspecified: Secondary | ICD-10-CM | POA: Insufficient documentation

## 2010-10-08 DIAGNOSIS — Z1389 Encounter for screening for other disorder: Secondary | ICD-10-CM | POA: Insufficient documentation

## 2010-10-08 DIAGNOSIS — O10019 Pre-existing essential hypertension complicating pregnancy, unspecified trimester: Secondary | ICD-10-CM | POA: Insufficient documentation

## 2010-10-08 DIAGNOSIS — Z363 Encounter for antenatal screening for malformations: Secondary | ICD-10-CM | POA: Insufficient documentation

## 2010-10-08 DIAGNOSIS — O24919 Unspecified diabetes mellitus in pregnancy, unspecified trimester: Secondary | ICD-10-CM | POA: Insufficient documentation

## 2010-10-12 ENCOUNTER — Inpatient Hospital Stay (HOSPITAL_COMMUNITY)
Admission: AD | Admit: 2010-10-12 | Discharge: 2010-10-12 | Disposition: A | Discharge status: Home / Self Care | Payer: Medicaid Other | Source: Ambulatory Visit | Attending: Obstetrics & Gynecology | Admitting: Obstetrics & Gynecology

## 2010-10-12 DIAGNOSIS — O9989 Other specified diseases and conditions complicating pregnancy, childbirth and the puerperium: Secondary | ICD-10-CM

## 2010-10-12 DIAGNOSIS — O99891 Other specified diseases and conditions complicating pregnancy: Secondary | ICD-10-CM | POA: Insufficient documentation

## 2010-10-12 DIAGNOSIS — R109 Unspecified abdominal pain: Secondary | ICD-10-CM | POA: Insufficient documentation

## 2010-10-12 LAB — URINALYSIS, ROUTINE W REFLEX MICROSCOPIC
Bilirubin Urine: NEGATIVE
Hgb urine dipstick: NEGATIVE
Ketones, ur: NEGATIVE mg/dL
Nitrite: NEGATIVE
Urobilinogen, UA: 0.2 mg/dL (ref 0.0–1.0)

## 2010-10-21 ENCOUNTER — Encounter: Payer: Medicaid Other | Attending: Obstetrics & Gynecology | Admitting: Dietician

## 2010-10-21 ENCOUNTER — Other Ambulatory Visit: Payer: Self-pay | Admitting: Obstetrics & Gynecology

## 2010-10-21 DIAGNOSIS — O169 Unspecified maternal hypertension, unspecified trimester: Secondary | ICD-10-CM

## 2010-10-21 DIAGNOSIS — Z713 Dietary counseling and surveillance: Secondary | ICD-10-CM | POA: Insufficient documentation

## 2010-10-21 DIAGNOSIS — O99810 Abnormal glucose complicating pregnancy: Secondary | ICD-10-CM | POA: Insufficient documentation

## 2010-10-21 DIAGNOSIS — IMO0002 Reserved for concepts with insufficient information to code with codable children: Secondary | ICD-10-CM

## 2010-10-21 DIAGNOSIS — O24919 Unspecified diabetes mellitus in pregnancy, unspecified trimester: Secondary | ICD-10-CM

## 2010-10-21 DIAGNOSIS — A6000 Herpesviral infection of urogenital system, unspecified: Secondary | ICD-10-CM

## 2010-10-21 LAB — POCT URINALYSIS DIP (DEVICE)
Hgb urine dipstick: NEGATIVE
Leukocytes, UA: NEGATIVE
Nitrite: NEGATIVE
Protein, ur: NEGATIVE mg/dL
pH: 6.5 (ref 5.0–8.0)

## 2010-11-04 ENCOUNTER — Other Ambulatory Visit: Payer: Self-pay | Admitting: Family Medicine

## 2010-11-04 DIAGNOSIS — O24919 Unspecified diabetes mellitus in pregnancy, unspecified trimester: Secondary | ICD-10-CM

## 2010-11-04 LAB — POCT URINALYSIS DIP (DEVICE)
Hgb urine dipstick: NEGATIVE
Protein, ur: NEGATIVE mg/dL
Specific Gravity, Urine: 1.025 (ref 1.005–1.030)
Urobilinogen, UA: 0.2 mg/dL (ref 0.0–1.0)

## 2010-11-18 ENCOUNTER — Other Ambulatory Visit: Payer: Self-pay | Admitting: Obstetrics and Gynecology

## 2010-11-18 ENCOUNTER — Other Ambulatory Visit: Payer: Self-pay | Admitting: Obstetrics & Gynecology

## 2010-11-18 DIAGNOSIS — O169 Unspecified maternal hypertension, unspecified trimester: Secondary | ICD-10-CM

## 2010-11-18 DIAGNOSIS — O24919 Unspecified diabetes mellitus in pregnancy, unspecified trimester: Secondary | ICD-10-CM

## 2010-11-18 DIAGNOSIS — IMO0002 Reserved for concepts with insufficient information to code with codable children: Secondary | ICD-10-CM

## 2010-11-18 LAB — CBC
Hemoglobin: 10.9 g/dL — ABNORMAL LOW (ref 12.0–15.0)
MCH: 26.3 pg (ref 26.0–34.0)
MCHC: 32 g/dL (ref 30.0–36.0)
MCV: 82.4 fL (ref 78.0–100.0)
RBC: 4.14 MIL/uL (ref 3.87–5.11)

## 2010-11-18 LAB — POCT URINALYSIS DIP (DEVICE)
Bilirubin Urine: NEGATIVE
Glucose, UA: 100 mg/dL — AB
Hgb urine dipstick: NEGATIVE
Nitrite: NEGATIVE
Specific Gravity, Urine: 1.015 (ref 1.005–1.030)
Urobilinogen, UA: 0.2 mg/dL (ref 0.0–1.0)
pH: 6.5 (ref 5.0–8.0)

## 2010-11-18 LAB — WET PREP, GENITAL
Clue Cells Wet Prep HPF POC: NONE SEEN
Trich, Wet Prep: NONE SEEN

## 2010-11-19 ENCOUNTER — Other Ambulatory Visit: Payer: Self-pay | Admitting: Family Medicine

## 2010-11-19 ENCOUNTER — Ambulatory Visit (HOSPITAL_COMMUNITY)
Admission: RE | Admit: 2010-11-19 | Discharge: 2010-11-19 | Disposition: A | Discharge status: Home / Self Care | Payer: Medicaid Other | Source: Ambulatory Visit | Attending: Family Medicine | Admitting: Family Medicine

## 2010-11-19 DIAGNOSIS — O358XX0 Maternal care for other (suspected) fetal abnormality and damage, not applicable or unspecified: Secondary | ICD-10-CM | POA: Insufficient documentation

## 2010-11-19 DIAGNOSIS — O10019 Pre-existing essential hypertension complicating pregnancy, unspecified trimester: Secondary | ICD-10-CM | POA: Insufficient documentation

## 2010-11-19 DIAGNOSIS — Z363 Encounter for antenatal screening for malformations: Secondary | ICD-10-CM | POA: Insufficient documentation

## 2010-11-19 DIAGNOSIS — R6259 Other lack of expected normal physiological development in childhood: Secondary | ICD-10-CM

## 2010-11-19 DIAGNOSIS — O24919 Unspecified diabetes mellitus in pregnancy, unspecified trimester: Secondary | ICD-10-CM | POA: Insufficient documentation

## 2010-11-19 DIAGNOSIS — O365990 Maternal care for other known or suspected poor fetal growth, unspecified trimester, not applicable or unspecified: Secondary | ICD-10-CM | POA: Insufficient documentation

## 2010-11-19 DIAGNOSIS — Z1389 Encounter for screening for other disorder: Secondary | ICD-10-CM | POA: Insufficient documentation

## 2010-11-19 LAB — HIV ANTIBODY (ROUTINE TESTING W REFLEX): HIV: NONREACTIVE

## 2010-11-19 NOTE — ED Notes (Signed)
Pt reports FBS of 85 this morning.  She currently monitors glucose qid and reports her values to her primary OB [facility])

## 2010-11-19 NOTE — ED Notes (Signed)
Pt. Had fetal echo at [facility] on 10/21/2010.  Structurally normal fetal heart.

## 2010-11-20 ENCOUNTER — Encounter: Payer: Self-pay | Admitting: Family Medicine

## 2010-11-22 ENCOUNTER — Telehealth: Payer: Self-pay

## 2010-11-22 ENCOUNTER — Ambulatory Visit (INDEPENDENT_AMBULATORY_CARE_PROVIDER_SITE_OTHER): Payer: Medicaid Other | Admitting: Obstetrics & Gynecology

## 2010-11-22 VITALS — BP 124/82 | HR 101

## 2010-11-22 DIAGNOSIS — Z3480 Encounter for supervision of other normal pregnancy, unspecified trimester: Secondary | ICD-10-CM

## 2010-11-22 DIAGNOSIS — O361190 Maternal care for Anti-A sensitization, unspecified trimester, not applicable or unspecified: Secondary | ICD-10-CM

## 2010-11-22 MED ORDER — RHO D IMMUNE GLOBULIN 300 MCG IM INJ
300.0000 ug | INJECTION | Freq: Once | INTRAMUSCULAR | Status: AC
  Administered 2010-11-22: 300 ug via INTRAMUSCULAR

## 2010-11-25 ENCOUNTER — Encounter: Payer: Self-pay | Admitting: Obstetrics & Gynecology

## 2010-11-25 NOTE — Progress Notes (Signed)
  Subjective:    Patient ID: [name] [name], female    DOB: 03-15-1986, 25 y.o.   MRN: [id number]  HPI    Review of Systems     Objective:   Physical Exam        Assessment & Plan:  Pt received Rhogam

## 2010-12-02 ENCOUNTER — Other Ambulatory Visit: Payer: Self-pay | Admitting: Obstetrics & Gynecology

## 2010-12-02 ENCOUNTER — Encounter: Payer: Medicaid Other | Attending: Obstetrics & Gynecology | Admitting: Dietician

## 2010-12-02 DIAGNOSIS — O169 Unspecified maternal hypertension, unspecified trimester: Secondary | ICD-10-CM

## 2010-12-02 DIAGNOSIS — O24919 Unspecified diabetes mellitus in pregnancy, unspecified trimester: Secondary | ICD-10-CM

## 2010-12-02 DIAGNOSIS — Z713 Dietary counseling and surveillance: Secondary | ICD-10-CM | POA: Insufficient documentation

## 2010-12-02 DIAGNOSIS — O99810 Abnormal glucose complicating pregnancy: Secondary | ICD-10-CM | POA: Insufficient documentation

## 2010-12-02 LAB — POCT URINALYSIS DIP (DEVICE)
Glucose, UA: >=1000 mg/dL — AB
Leukocytes, UA: NEGATIVE
Protein, ur: NEGATIVE mg/dL
Specific Gravity, Urine: 1.02 (ref 1.005–1.030)
Urobilinogen, UA: 0.2 mg/dL (ref 0.0–1.0)

## 2010-12-02 LAB — GLUCOSE, CAPILLARY: Glucose-Capillary: 321 mg/dL — ABNORMAL HIGH (ref 70–99)

## 2010-12-03 ENCOUNTER — Other Ambulatory Visit: Payer: Self-pay | Admitting: Family Medicine

## 2010-12-03 ENCOUNTER — Ambulatory Visit (HOSPITAL_COMMUNITY)
Admission: RE | Admit: 2010-12-03 | Discharge: 2010-12-03 | Disposition: A | Discharge status: Home / Self Care | Payer: Medicaid Other | Source: Ambulatory Visit | Attending: Family Medicine | Admitting: Family Medicine

## 2010-12-03 ENCOUNTER — Other Ambulatory Visit (HOSPITAL_COMMUNITY): Payer: Medicaid Other

## 2010-12-03 DIAGNOSIS — O365990 Maternal care for other known or suspected poor fetal growth, unspecified trimester, not applicable or unspecified: Secondary | ICD-10-CM | POA: Insufficient documentation

## 2010-12-03 DIAGNOSIS — E669 Obesity, unspecified: Secondary | ICD-10-CM | POA: Insufficient documentation

## 2010-12-03 DIAGNOSIS — O10919 Unspecified pre-existing hypertension complicating pregnancy, unspecified trimester: Secondary | ICD-10-CM

## 2010-12-03 DIAGNOSIS — R6259 Other lack of expected normal physiological development in childhood: Secondary | ICD-10-CM

## 2010-12-03 DIAGNOSIS — IMO0002 Reserved for concepts with insufficient information to code with codable children: Secondary | ICD-10-CM

## 2010-12-03 DIAGNOSIS — O99210 Obesity complicating pregnancy, unspecified trimester: Secondary | ICD-10-CM | POA: Insufficient documentation

## 2010-12-03 DIAGNOSIS — O10019 Pre-existing essential hypertension complicating pregnancy, unspecified trimester: Secondary | ICD-10-CM | POA: Insufficient documentation

## 2010-12-03 DIAGNOSIS — O24919 Unspecified diabetes mellitus in pregnancy, unspecified trimester: Secondary | ICD-10-CM | POA: Insufficient documentation

## 2010-12-03 NOTE — Progress Notes (Signed)
Patient seen for ultrasound only appointment today.  Please see AS-OBGYN report for details.  

## 2010-12-09 ENCOUNTER — Other Ambulatory Visit: Payer: Self-pay | Admitting: Obstetrics & Gynecology

## 2010-12-09 DIAGNOSIS — O24919 Unspecified diabetes mellitus in pregnancy, unspecified trimester: Secondary | ICD-10-CM

## 2010-12-09 LAB — POCT URINALYSIS DIP (DEVICE)
Bilirubin Urine: NEGATIVE
Hgb urine dipstick: NEGATIVE
Ketones, ur: NEGATIVE mg/dL
Nitrite: NEGATIVE
Protein, ur: NEGATIVE mg/dL
pH: 7 (ref 5.0–8.0)

## 2010-12-12 ENCOUNTER — Ambulatory Visit (HOSPITAL_COMMUNITY)
Admission: RE | Admit: 2010-12-12 | Discharge: 2010-12-12 | Disposition: A | Discharge status: Home / Self Care | Payer: Medicaid Other | Source: Ambulatory Visit | Attending: Family Medicine | Admitting: Family Medicine

## 2010-12-12 VITALS — BP 123/65 | HR 89 | Wt 212.0 lb

## 2010-12-12 DIAGNOSIS — O24919 Unspecified diabetes mellitus in pregnancy, unspecified trimester: Secondary | ICD-10-CM | POA: Insufficient documentation

## 2010-12-12 DIAGNOSIS — O10019 Pre-existing essential hypertension complicating pregnancy, unspecified trimester: Secondary | ICD-10-CM | POA: Insufficient documentation

## 2010-12-12 DIAGNOSIS — E669 Obesity, unspecified: Secondary | ICD-10-CM | POA: Insufficient documentation

## 2010-12-12 DIAGNOSIS — O10919 Unspecified pre-existing hypertension complicating pregnancy, unspecified trimester: Secondary | ICD-10-CM

## 2010-12-12 DIAGNOSIS — IMO0002 Reserved for concepts with insufficient information to code with codable children: Secondary | ICD-10-CM

## 2010-12-12 DIAGNOSIS — O365990 Maternal care for other known or suspected poor fetal growth, unspecified trimester, not applicable or unspecified: Secondary | ICD-10-CM | POA: Insufficient documentation

## 2010-12-12 NOTE — Progress Notes (Signed)
Report in AS/EPIC; follow-up as scheduled 

## 2010-12-12 NOTE — Telephone Encounter (Signed)
Pt contacted.

## 2010-12-12 NOTE — Progress Notes (Addendum)
Report in AS-OBGYN/EPIC; follow-up as scheduled 

## 2010-12-12 NOTE — ED Notes (Signed)
Pt states FBS was 120 this am.  She reports her glucose values to her primary OB [facility]).

## 2010-12-16 ENCOUNTER — Other Ambulatory Visit: Payer: Self-pay | Admitting: Family Medicine

## 2010-12-16 DIAGNOSIS — O10019 Pre-existing essential hypertension complicating pregnancy, unspecified trimester: Secondary | ICD-10-CM

## 2010-12-16 DIAGNOSIS — O24919 Unspecified diabetes mellitus in pregnancy, unspecified trimester: Secondary | ICD-10-CM

## 2010-12-16 LAB — POCT URINALYSIS DIP (DEVICE)
Ketones, ur: NEGATIVE mg/dL
Protein, ur: NEGATIVE mg/dL
Specific Gravity, Urine: 1.015 (ref 1.005–1.030)
Urobilinogen, UA: 0.2 mg/dL (ref 0.0–1.0)
pH: 7 (ref 5.0–8.0)

## 2010-12-19 ENCOUNTER — Ambulatory Visit (HOSPITAL_COMMUNITY)
Admission: RE | Admit: 2010-12-19 | Discharge: 2010-12-19 | Disposition: A | Discharge status: Home / Self Care | Payer: Medicaid Other | Source: Ambulatory Visit | Attending: Family Medicine | Admitting: Family Medicine

## 2010-12-19 DIAGNOSIS — E669 Obesity, unspecified: Secondary | ICD-10-CM | POA: Insufficient documentation

## 2010-12-19 DIAGNOSIS — O10019 Pre-existing essential hypertension complicating pregnancy, unspecified trimester: Secondary | ICD-10-CM | POA: Insufficient documentation

## 2010-12-19 DIAGNOSIS — O365990 Maternal care for other known or suspected poor fetal growth, unspecified trimester, not applicable or unspecified: Secondary | ICD-10-CM | POA: Insufficient documentation

## 2010-12-19 DIAGNOSIS — O24919 Unspecified diabetes mellitus in pregnancy, unspecified trimester: Secondary | ICD-10-CM | POA: Insufficient documentation

## 2010-12-19 NOTE — ED Notes (Signed)
Fasting blood sugar this am = 95 per pt.   She reports glucose to primary OB.

## 2010-12-23 ENCOUNTER — Other Ambulatory Visit: Payer: Medicaid Other

## 2010-12-26 ENCOUNTER — Ambulatory Visit (HOSPITAL_COMMUNITY)
Admission: RE | Admit: 2010-12-26 | Discharge: 2010-12-26 | Disposition: A | Discharge status: Home / Self Care | Payer: Medicaid Other | Source: Ambulatory Visit | Attending: Family Medicine | Admitting: Family Medicine

## 2010-12-26 ENCOUNTER — Ambulatory Visit (HOSPITAL_COMMUNITY): Payer: Medicaid Other

## 2010-12-26 DIAGNOSIS — O24919 Unspecified diabetes mellitus in pregnancy, unspecified trimester: Secondary | ICD-10-CM | POA: Insufficient documentation

## 2010-12-26 DIAGNOSIS — O10019 Pre-existing essential hypertension complicating pregnancy, unspecified trimester: Secondary | ICD-10-CM | POA: Insufficient documentation

## 2010-12-30 ENCOUNTER — Ambulatory Visit (INDEPENDENT_AMBULATORY_CARE_PROVIDER_SITE_OTHER): Payer: Medicaid Other | Admitting: *Deleted

## 2010-12-30 DIAGNOSIS — IMO0002 Reserved for concepts with insufficient information to code with codable children: Secondary | ICD-10-CM

## 2010-12-30 DIAGNOSIS — O24919 Unspecified diabetes mellitus in pregnancy, unspecified trimester: Secondary | ICD-10-CM

## 2010-12-30 DIAGNOSIS — O169 Unspecified maternal hypertension, unspecified trimester: Secondary | ICD-10-CM

## 2010-12-30 LAB — POCT URINALYSIS DIP (DEVICE)
Hgb urine dipstick: NEGATIVE
Protein, ur: NEGATIVE mg/dL
Specific Gravity, Urine: 1.02 (ref 1.005–1.030)
Urobilinogen, UA: 0.2 mg/dL (ref 0.0–1.0)
pH: 7.5 (ref 5.0–8.0)

## 2010-12-30 NOTE — Progress Notes (Signed)
12/30/2010 NST reviewed and reactive

## 2011-01-02 ENCOUNTER — Other Ambulatory Visit: Payer: Self-pay | Admitting: Family Medicine

## 2011-01-02 ENCOUNTER — Ambulatory Visit (HOSPITAL_COMMUNITY)
Admission: RE | Admit: 2011-01-02 | Discharge: 2011-01-02 | Disposition: A | Discharge status: Home / Self Care | Payer: Medicaid Other | Source: Ambulatory Visit | Attending: Family Medicine | Admitting: Family Medicine

## 2011-01-02 DIAGNOSIS — O99210 Obesity complicating pregnancy, unspecified trimester: Secondary | ICD-10-CM | POA: Insufficient documentation

## 2011-01-02 DIAGNOSIS — O10019 Pre-existing essential hypertension complicating pregnancy, unspecified trimester: Secondary | ICD-10-CM | POA: Insufficient documentation

## 2011-01-02 DIAGNOSIS — E669 Obesity, unspecified: Secondary | ICD-10-CM | POA: Insufficient documentation

## 2011-01-02 DIAGNOSIS — O365990 Maternal care for other known or suspected poor fetal growth, unspecified trimester, not applicable or unspecified: Secondary | ICD-10-CM | POA: Insufficient documentation

## 2011-01-02 DIAGNOSIS — O10919 Unspecified pre-existing hypertension complicating pregnancy, unspecified trimester: Secondary | ICD-10-CM

## 2011-01-02 DIAGNOSIS — IMO0002 Reserved for concepts with insufficient information to code with codable children: Secondary | ICD-10-CM

## 2011-01-02 DIAGNOSIS — O24919 Unspecified diabetes mellitus in pregnancy, unspecified trimester: Secondary | ICD-10-CM | POA: Insufficient documentation

## 2011-01-06 ENCOUNTER — Ambulatory Visit (INDEPENDENT_AMBULATORY_CARE_PROVIDER_SITE_OTHER): Payer: Medicaid Other | Admitting: Family Medicine

## 2011-01-06 ENCOUNTER — Other Ambulatory Visit: Payer: Medicaid Other

## 2011-01-06 DIAGNOSIS — O169 Unspecified maternal hypertension, unspecified trimester: Secondary | ICD-10-CM

## 2011-01-06 DIAGNOSIS — O24919 Unspecified diabetes mellitus in pregnancy, unspecified trimester: Secondary | ICD-10-CM

## 2011-01-06 LAB — GLUCOSE, CAPILLARY

## 2011-01-06 NOTE — Progress Notes (Signed)
Patient with GDM on NPH here for twice weekly NST.  BR: 140's Accels: present Decels: none Contractions: None  Category 1 tracing/reactive

## 2011-01-09 ENCOUNTER — Ambulatory Visit (HOSPITAL_COMMUNITY)
Admission: RE | Admit: 2011-01-09 | Discharge: 2011-01-09 | Disposition: A | Discharge status: Home / Self Care | Payer: Medicaid Other | Source: Ambulatory Visit | Attending: Family Medicine | Admitting: Family Medicine

## 2011-01-09 DIAGNOSIS — O24919 Unspecified diabetes mellitus in pregnancy, unspecified trimester: Secondary | ICD-10-CM | POA: Insufficient documentation

## 2011-01-09 DIAGNOSIS — O10019 Pre-existing essential hypertension complicating pregnancy, unspecified trimester: Secondary | ICD-10-CM | POA: Insufficient documentation

## 2011-01-09 DIAGNOSIS — O99210 Obesity complicating pregnancy, unspecified trimester: Secondary | ICD-10-CM | POA: Insufficient documentation

## 2011-01-09 DIAGNOSIS — O365990 Maternal care for other known or suspected poor fetal growth, unspecified trimester, not applicable or unspecified: Secondary | ICD-10-CM | POA: Insufficient documentation

## 2011-01-09 DIAGNOSIS — IMO0002 Reserved for concepts with insufficient information to code with codable children: Secondary | ICD-10-CM

## 2011-01-09 DIAGNOSIS — E119 Type 2 diabetes mellitus without complications: Secondary | ICD-10-CM

## 2011-01-09 DIAGNOSIS — O10919 Unspecified pre-existing hypertension complicating pregnancy, unspecified trimester: Secondary | ICD-10-CM

## 2011-01-09 DIAGNOSIS — E669 Obesity, unspecified: Secondary | ICD-10-CM | POA: Insufficient documentation

## 2011-01-09 NOTE — ED Notes (Signed)
Insulin is monitored by the clinic.  FBS this am 102. Patient states positive fetal movement.

## 2011-01-09 NOTE — Progress Notes (Signed)
Vital signs reviewed; see ultrasound report 

## 2011-01-14 ENCOUNTER — Ambulatory Visit (INDEPENDENT_AMBULATORY_CARE_PROVIDER_SITE_OTHER): Payer: Medicaid Other | Admitting: Obstetrics & Gynecology

## 2011-01-14 DIAGNOSIS — O169 Unspecified maternal hypertension, unspecified trimester: Secondary | ICD-10-CM

## 2011-01-14 DIAGNOSIS — O24919 Unspecified diabetes mellitus in pregnancy, unspecified trimester: Secondary | ICD-10-CM

## 2011-01-14 NOTE — Progress Notes (Signed)
P-70 

## 2011-01-14 NOTE — Progress Notes (Signed)
Reactive NST 

## 2011-01-15 ENCOUNTER — Encounter: Payer: Self-pay | Admitting: Obstetrics and Gynecology

## 2011-01-15 LAB — POCT URINALYSIS DIP (DEVICE)
Bilirubin Urine: NEGATIVE
Glucose, UA: NEGATIVE mg/dL
Ketones, ur: NEGATIVE mg/dL
pH: 7.5 (ref 5.0–8.0)

## 2011-01-16 ENCOUNTER — Ambulatory Visit (HOSPITAL_COMMUNITY)
Admission: RE | Admit: 2011-01-16 | Discharge: 2011-01-16 | Disposition: A | Discharge status: Home / Self Care | Payer: Medicaid Other | Source: Ambulatory Visit | Attending: Family Medicine | Admitting: Family Medicine

## 2011-01-16 DIAGNOSIS — O169 Unspecified maternal hypertension, unspecified trimester: Secondary | ICD-10-CM

## 2011-01-16 DIAGNOSIS — IMO0002 Reserved for concepts with insufficient information to code with codable children: Secondary | ICD-10-CM

## 2011-01-16 DIAGNOSIS — E669 Obesity, unspecified: Secondary | ICD-10-CM | POA: Insufficient documentation

## 2011-01-16 DIAGNOSIS — O10919 Unspecified pre-existing hypertension complicating pregnancy, unspecified trimester: Secondary | ICD-10-CM

## 2011-01-16 DIAGNOSIS — O10019 Pre-existing essential hypertension complicating pregnancy, unspecified trimester: Secondary | ICD-10-CM | POA: Insufficient documentation

## 2011-01-16 DIAGNOSIS — O365990 Maternal care for other known or suspected poor fetal growth, unspecified trimester, not applicable or unspecified: Secondary | ICD-10-CM | POA: Insufficient documentation

## 2011-01-16 DIAGNOSIS — O24919 Unspecified diabetes mellitus in pregnancy, unspecified trimester: Secondary | ICD-10-CM | POA: Insufficient documentation

## 2011-01-16 DIAGNOSIS — O99210 Obesity complicating pregnancy, unspecified trimester: Secondary | ICD-10-CM | POA: Insufficient documentation

## 2011-01-16 NOTE — Progress Notes (Signed)
Ultrasound in AS/OBGYN/EPIC.  Follow up U/S scheduled 

## 2011-01-16 NOTE — Progress Notes (Signed)
Encounter addended by: Marlana Latus, RN on: 01/16/2011 11:40 AM<BR>     Documentation filed: Charges VN, Flowsheet VN

## 2011-01-20 ENCOUNTER — Ambulatory Visit (INDEPENDENT_AMBULATORY_CARE_PROVIDER_SITE_OTHER): Payer: Medicaid Other | Admitting: Advanced Practice Midwife

## 2011-01-20 VITALS — BP 120/76 | Temp 99.9°F | Wt 221.5 lb

## 2011-01-20 DIAGNOSIS — O0990 Supervision of high risk pregnancy, unspecified, unspecified trimester: Secondary | ICD-10-CM

## 2011-01-20 DIAGNOSIS — O169 Unspecified maternal hypertension, unspecified trimester: Secondary | ICD-10-CM

## 2011-01-20 DIAGNOSIS — O24919 Unspecified diabetes mellitus in pregnancy, unspecified trimester: Secondary | ICD-10-CM

## 2011-01-20 LAB — POCT URINALYSIS DIP (DEVICE)
Bilirubin Urine: NEGATIVE
Glucose, UA: NEGATIVE mg/dL
Hgb urine dipstick: NEGATIVE
Nitrite: NEGATIVE

## 2011-01-20 NOTE — Progress Notes (Signed)
Swelling, feet Pain in low back.

## 2011-01-20 NOTE — Progress Notes (Signed)
GC/CT done today. + GBS urine culture in first trimester - discussed GBS prophylaxis with patient. NST today reactive.

## 2011-01-20 NOTE — Patient Instructions (Signed)
Pregnancy - Third Trimester The third trimester begins at the 28th week of pregnancy and ends at birth. It is important to follow your doctor's instructions. HOME CARE  Keep your doctor's appointments.   Do not smoke.   Do not drink alcohol or use drugs.   Only take medicine the doctor tells you to take.   Take prenatal vitamins as directed. The vitamin should contain 1 milligram of folic acid.   Exercise.   Eat healthy foods. Eat regular, well-balanced meals.   You can have sex (intercourse) if there are no other problems with the pregnancy.   Do not use hot tubs, steam rooms, or saunas.   Wear a seat belt while driving.   Avoid raw meat, uncooked cheese, and litter boxes and soil used by cats.   Rest with your legs raised (elevated).   Make a list of emergency phone numbers. Keep this list with you.   Arrange for help when you come back home after delivering the baby.   Make a trial run to the hospital.   Take prenatal classes.   Prepare the baby's nursery.   Do not travel out of the city. If you absolutely have to, get permission from your doctor first.   Wear flat shoes. Do not wear high heels.  GET HELP IF:  You have any concerns or worries during your pregnancy.  GET HELP RIGHT AWAY IF:  You have a temperature by mouth above 101.0, not controlled by medicine.   You have not felt the baby move for more than 1 hour. If you think the baby is not moving as much as normal, eat something with sugar in it or lie down on your left side for an hour. The baby should move at least 4 to 5 times per hour.   Fluid is coming from the vagina.   Blood is coming from the vagina. Light spotting is common, especially after sex (intercourse).   You have belly (abdominal) pain.   You have a bad smelling fluid (discharge) coming from the vagina. The fluid changes from clear to white.   You still feel sick to your stomach (nauseous).   You throw up (vomit) more than 24  hours.   You have the chills.   You have shortness of breath.   You have a burning feeling when you pee (urinate).   You loose or gain more than 2 pounds (0.9 kilograms) of weight over a weeks time, or as suggested by your doctor.   Your face, hands, feet, or legs get puffy (swell).   You have a bad headache that will not go away.   You start to have problems seeing (blurry or double vision).   You fall, are in a car accident, or have any kind of trauma.   There is mental or physical violence at home.  MAKE SURE YOU:   Understand these instructions.   Will watch your condition.   Will get help right away if you are not doing well or get worse.  Document Released: 07/23/2009  [employer] Patient Information 2011 [employer]. 

## 2011-01-21 LAB — GC/CHLAMYDIA PROBE AMP, GENITAL
Chlamydia, DNA Probe: POSITIVE — AB
GC Probe Amp, Genital: NEGATIVE

## 2011-01-22 ENCOUNTER — Telehealth: Payer: Self-pay | Admitting: *Deleted

## 2011-01-22 ENCOUNTER — Telehealth: Payer: Self-pay | Admitting: Advanced Practice Midwife

## 2011-01-22 MED ORDER — AZITHROMYCIN 500 MG PO TABS: 1000.0000 mg | ORAL_TABLET | Freq: Every day | ORAL | Status: AC

## 2011-01-22 MED ORDER — AZITHROMYCIN 500 MG PO TABS: 1000.0000 mg | ORAL_TABLET | Freq: Once | ORAL | Status: DC

## 2011-01-22 NOTE — Telephone Encounter (Signed)
[name] [name], pt was called as you requested.

## 2011-01-22 NOTE — Telephone Encounter (Signed)
Pharmacy changed in system. [facility] [address] [facility].

## 2011-01-22 NOTE — Telephone Encounter (Signed)
Called pt and spoke with a female who stated pt. Not there , left a message for patient to call clinic.

## 2011-01-22 NOTE — Telephone Encounter (Signed)
Please notify pt that she is positive for Chlamydia, medication available for pick up at pharmacy.

## 2011-01-22 NOTE — Telephone Encounter (Signed)
Called pt. And informed her her culture came back for chlamydia  And that she and her partner need treatment and not to resume intercourse/or sexual contact until both have completed treatment.  Informed her prescription is already been sent to pharmacy. Pt denies any questions, states she has had it before

## 2011-01-22 NOTE — Telephone Encounter (Signed)
Pt. Called and states went to pick up prescription and it was not there. Reviewed chart and informed pt. rx was sent to [facility] on [address]. Pt states [facility] [address]- pt.doesn't have a ride to [facility] and that is why she switched pharmacies. Informed pt. Would sent to provider and request [facility]

## 2011-01-23 ENCOUNTER — Other Ambulatory Visit: Payer: Self-pay | Admitting: Family Medicine

## 2011-01-23 ENCOUNTER — Ambulatory Visit (HOSPITAL_COMMUNITY)
Admission: RE | Admit: 2011-01-23 | Discharge: 2011-01-23 | Disposition: A | Discharge status: Home / Self Care | Payer: Medicaid Other | Source: Ambulatory Visit | Attending: Family Medicine | Admitting: Family Medicine

## 2011-01-23 DIAGNOSIS — IMO0002 Reserved for concepts with insufficient information to code with codable children: Secondary | ICD-10-CM

## 2011-01-23 DIAGNOSIS — O10919 Unspecified pre-existing hypertension complicating pregnancy, unspecified trimester: Secondary | ICD-10-CM

## 2011-01-23 DIAGNOSIS — E669 Obesity, unspecified: Secondary | ICD-10-CM | POA: Insufficient documentation

## 2011-01-23 DIAGNOSIS — O10019 Pre-existing essential hypertension complicating pregnancy, unspecified trimester: Secondary | ICD-10-CM | POA: Insufficient documentation

## 2011-01-23 DIAGNOSIS — O365990 Maternal care for other known or suspected poor fetal growth, unspecified trimester, not applicable or unspecified: Secondary | ICD-10-CM | POA: Insufficient documentation

## 2011-01-23 DIAGNOSIS — O24919 Unspecified diabetes mellitus in pregnancy, unspecified trimester: Secondary | ICD-10-CM | POA: Insufficient documentation

## 2011-01-27 ENCOUNTER — Ambulatory Visit (INDEPENDENT_AMBULATORY_CARE_PROVIDER_SITE_OTHER): Payer: Medicaid Other | Admitting: Obstetrics and Gynecology

## 2011-01-27 DIAGNOSIS — O24919 Unspecified diabetes mellitus in pregnancy, unspecified trimester: Secondary | ICD-10-CM

## 2011-01-27 DIAGNOSIS — O169 Unspecified maternal hypertension, unspecified trimester: Secondary | ICD-10-CM

## 2011-01-27 LAB — POCT URINALYSIS DIP (DEVICE)
Glucose, UA: NEGATIVE mg/dL
Nitrite: NEGATIVE
Protein, ur: NEGATIVE mg/dL
Specific Gravity, Urine: 1.02 (ref 1.005–1.030)
Urobilinogen, UA: 0.2 mg/dL (ref 0.0–1.0)

## 2011-01-27 NOTE — Progress Notes (Signed)
FM/Labor precautions reviewed. NST reactive today CBG fasting 80-130 (6/8 abnormal); 2hr pp 52-120  Will increase bedtime NPH 18 U Will schedule IOL at 39 weeks on 9/28 Patient reports taking prescription for chlamydia treatment

## 2011-01-27 NOTE — Patient Instructions (Addendum)
Please increase bedtime NPH to 18 Units  Labor Induction A pregnant woman usually goes into labor spontaneously before the birth of her baby. Most babies are born between 16 and 42 weeks of the pregnancy. When this does not happen, caregivers may use medication or other methods to bring on (induce) labor. Labor induction causes a pregnant woman's uterus to contract, the cervix to open (dilate) and thin out (efface) to prepare for the vaginal birth of her baby. Several methods of labor induction may be used such as:  Massaging the nipple and areola of the breasts (nipple stimulation).   Prostaglandin medication used orally or as a vaginal cream.   Striping of membranes (your caregiver inserts a finger between the cervix and membranes around the baby's head) causes the body to produce prostaglandins that soften the cervix and cause the uterus to contract.   Rupture of the water bag (amniotomy).   Oxytocin by IV.   Special dilators placed into the cervical canal that causes the cervix to soften and open.   Mechanical devices to stretch open the cervix such as, a dilated foley catheter.  Whether your labor will be induced depends on the condition of you and your baby, how far along you are, are the baby's lung maturity, the condition of the cervix, the way the baby is lying, and other factors. Usually, labor is not induced before 39 weeks of the pregnancy unless there is a problem with the baby or mother, and it becomes necessary to induce labor. REASONS LABOR SHOULD BE INDUCED:  The health of the baby or mother has become at risk.   The pregnancy is overdue by 2 weeks or more.   Your water breaks (premature rupture of membranes), the baby's lungs are mature, and labor does not start on its own.   You develop high blood pressure (toxemia of pregnancy).   You develop an infection in your uterus.   You have diabetes or other serious medical illness.   Amniotic fluid amounts are small  around the baby.   Your placenta begins to separate from the inner wall of the uterus before the baby is born (placental abruption). This condition may cause you to have an emergency Cesarean delivery.   You have fetal death.   A social induction is also known as an induction for convenience. Most of the time, labor is induced for sound medical reasons. Sometimes, it is done as a Agricultural engineer. Living a long way from the hospital or having a history of very rapid labors may be reasons the mother may want to induce delivery.  REASONS LABOR SHOULD NOT BE INDUCED:  You have had previous surgeries on your uterus. This is especially true if the surgeries went into the inside lining and cavity of the uterus. This gives an added risk for rupturing the uterus.   You have placenta previa. This means your placenta lies very low in the uterus and blocks the opening (cervix) for the baby to get out.   Your baby is not in a head down position. For example, if your baby lies across your uterus (transverse) instead of head first.   If the umbilical cord drops down into the birth canal in front of your baby. This could cut off the baby's blood supply and oxygen to the baby.  RISKS & COMPLICATIONS OF THE PROCEDURE Problems seldom occur with labor induction, but there can be some complications. Some of the risks of induction include:  Change in fetal heart rate (  too high, too low or irradic).   Increased risk of a premature baby, even if you think your baby is term.   Increased risk of fetal distress. This means your baby gets into problems during induction. This can be caused by the umbilical cord coming out in front of the baby or is being squeezed.   Increased risk of infection to mother and baby.   Increased chance of having a Cesarean delivery. This is an operation on your belly (abdomen) to remove the baby.   Strong contractions can lead to abruption. This is a separating of the placenta from the  uterus.   Uterine rupture, especially if you had a previous Cesarean or surgery on your uterus.  When labor is induced because of medical problems, other risks may be present. Induced labor may lead to:  Increased use of medications for pain relief.   Continuous fetal monitoring.   Other interventions.  When induction is needed for medical reasons, the benefits of induction may outweigh the risks. INDUCTION PROCEDURE It can sometimes take up to 2 or 3 days to induce labor. It usually takes less time. It takes longer when you are induced early in the pregnancy and for first pregnancies.  Before coming to the hospital for an induction:  Do not eat much before you come to the hospital (for at least 8 hours).   Do not eat after midnight if you are going to be induced the next morning.   Be aware that medications for labor induction can upset your stomach.   Let your caregiver know if you need medications for pain.  HOME CARE INSTRUCTIONS If you have been induced in your caregiver's office to start labor, and are allowed to go home, follow the instructions given to you by your care giver. SEEK IMMEDIATE MEDICAL CARE IF:  You develop any kind of vaginal bleeding.   You develop contractions that are severe and continuous.   You feel faint or feel light headed.   You do not develop contractions within the time your caregiver suggests you should.   You begin to run a temperature of 100 F (37.8 C) or develop chills.   You no longer feel the normal fetal movement.  Document Released: 09/17/2006 Document Re-Released: 02/22/2009 [facility] Patient Information 2011 [place], [place].

## 2011-01-27 NOTE — Progress Notes (Signed)
P = 79  Pt reports back pain @ night.

## 2011-01-29 ENCOUNTER — Inpatient Hospital Stay (HOSPITAL_COMMUNITY)
Admission: AD | Admit: 2011-01-29 | Discharge: 2011-02-02 | DRG: 765 | Disposition: A | Discharge status: Home / Self Care | Payer: Medicaid Other | Source: Ambulatory Visit | Attending: Obstetrics & Gynecology | Admitting: Obstetrics & Gynecology

## 2011-01-29 ENCOUNTER — Encounter (HOSPITAL_COMMUNITY): Payer: Self-pay | Admitting: *Deleted

## 2011-01-29 DIAGNOSIS — O365990 Maternal care for other known or suspected poor fetal growth, unspecified trimester, not applicable or unspecified: Secondary | ICD-10-CM | POA: Diagnosis present

## 2011-01-29 DIAGNOSIS — Z22330 Carrier of Group B streptococcus: Secondary | ICD-10-CM

## 2011-01-29 DIAGNOSIS — N926 Irregular menstruation, unspecified: Secondary | ICD-10-CM

## 2011-01-29 DIAGNOSIS — O24119 Pre-existing diabetes mellitus, type 2, in pregnancy, unspecified trimester: Secondary | ICD-10-CM

## 2011-01-29 DIAGNOSIS — E119 Type 2 diabetes mellitus without complications: Secondary | ICD-10-CM | POA: Diagnosis present

## 2011-01-29 DIAGNOSIS — O99892 Other specified diseases and conditions complicating childbirth: Secondary | ICD-10-CM | POA: Diagnosis present

## 2011-01-29 DIAGNOSIS — O2432 Unspecified pre-existing diabetes mellitus in childbirth: Secondary | ICD-10-CM | POA: Diagnosis present

## 2011-01-29 DIAGNOSIS — O4290 Premature rupture of membranes, unspecified as to length of time between rupture and onset of labor, unspecified weeks of gestation: Secondary | ICD-10-CM | POA: Diagnosis present

## 2011-01-29 DIAGNOSIS — A6009 Herpesviral infection of other urogenital tract: Secondary | ICD-10-CM

## 2011-01-29 DIAGNOSIS — O1002 Pre-existing essential hypertension complicating childbirth: Secondary | ICD-10-CM | POA: Diagnosis present

## 2011-01-29 DIAGNOSIS — IMO0002 Reserved for concepts with insufficient information to code with codable children: Secondary | ICD-10-CM

## 2011-01-29 DIAGNOSIS — O24919 Unspecified diabetes mellitus in pregnancy, unspecified trimester: Secondary | ICD-10-CM

## 2011-01-29 DIAGNOSIS — B951 Streptococcus, group B, as the cause of diseases classified elsewhere: Secondary | ICD-10-CM

## 2011-01-29 LAB — POCT FERN TEST: Fern Test: POSITIVE

## 2011-01-29 LAB — CBC
Hemoglobin: 10.2 g/dL — ABNORMAL LOW (ref 12.0–15.0)
MCH: 25.6 pg — ABNORMAL LOW (ref 26.0–34.0)
MCHC: 32 g/dL (ref 30.0–36.0)
RDW: 16.9 % — ABNORMAL HIGH (ref 11.5–15.5)

## 2011-01-29 LAB — GLUCOSE, CAPILLARY: Glucose-Capillary: 76 mg/dL (ref 70–99)

## 2011-01-29 MED ORDER — HYDROXYZINE HCL 50 MG PO TABS
50.0000 mg | ORAL_TABLET | Freq: Four times a day (QID) | ORAL | Status: DC | PRN
  Filled 2011-01-29: qty 1

## 2011-01-29 MED ORDER — LIDOCAINE HCL (PF) 1 % IJ SOLN: 30.0000 mL | INTRAMUSCULAR | Status: DC | PRN

## 2011-01-29 MED ORDER — MISOPROSTOL 25 MCG QUARTER TABLET
25.0000 ug | ORAL_TABLET | ORAL | Status: DC | PRN
  Administered 2011-01-29: 25 ug via VAGINAL
  Filled 2011-01-29: qty 0.25

## 2011-01-29 MED ORDER — OXYCODONE-ACETAMINOPHEN 5-325 MG PO TABS: 2.0000 | ORAL_TABLET | ORAL | Status: DC | PRN

## 2011-01-29 MED ORDER — BUTORPHANOL TARTRATE 2 MG/ML IJ SOLN
INTRAMUSCULAR | Status: AC
  Administered 2011-01-29: 1 mg via INTRAVENOUS
  Filled 2011-01-29: qty 1

## 2011-01-29 MED ORDER — PENICILLIN G POTASSIUM 5000000 UNITS IJ SOLR
2.5000 10*6.[IU] | INTRAVENOUS | Status: DC
  Filled 2011-01-29 (×11): qty 2.5

## 2011-01-29 MED ORDER — CITRIC ACID-SODIUM CITRATE 334-500 MG/5ML PO SOLN
30.0000 mL | ORAL | Status: DC | PRN
  Administered 2011-01-30: 30 mL via ORAL
  Filled 2011-01-29: qty 15

## 2011-01-29 MED ORDER — IBUPROFEN 600 MG PO TABS: 600.0000 mg | ORAL_TABLET | Freq: Four times a day (QID) | ORAL | Status: DC | PRN

## 2011-01-29 MED ORDER — PENICILLIN G POTASSIUM 5000000 UNITS IJ SOLR
2.5000 10*6.[IU] | INTRAVENOUS | Status: DC
  Administered 2011-01-29 – 2011-01-30 (×3): 2.5 10*6.[IU] via INTRAVENOUS
  Filled 2011-01-29 (×5): qty 2.5

## 2011-01-29 MED ORDER — PENICILLIN G POTASSIUM 5000000 UNITS IJ SOLR
5.0000 10*6.[IU] | Freq: Once | INTRAMUSCULAR | Status: DC
  Filled 2011-01-29: qty 5

## 2011-01-29 MED ORDER — PENICILLIN G POTASSIUM 5000000 UNITS IJ SOLR
5.0000 10*6.[IU] | Freq: Once | INTRAVENOUS | Status: AC
  Administered 2011-01-29: 5 10*6.[IU] via INTRAVENOUS
  Filled 2011-01-29: qty 5

## 2011-01-29 MED ORDER — OXYTOCIN 20 UNITS IN LACTATED RINGERS INFUSION - SIMPLE
1.0000 m[IU]/min | INTRAVENOUS | Status: DC
  Administered 2011-01-30: 1 m[IU]/min via INTRAVENOUS
  Filled 2011-01-29: qty 1000

## 2011-01-29 MED ORDER — LACTATED RINGERS IV SOLN
INTRAVENOUS | Status: DC
  Administered 2011-01-29 – 2011-01-30 (×3): 125 mL/h via INTRAVENOUS

## 2011-01-29 MED ORDER — LACTATED RINGERS IV SOLN
500.0000 mL | INTRAVENOUS | Status: DC | PRN
  Administered 2011-01-30: 1000 mL via INTRAVENOUS
  Administered 2011-01-30: 999 mL via INTRAVENOUS

## 2011-01-29 MED ORDER — OXYTOCIN BOLUS FROM INFUSION
500.0000 mL | Freq: Once | INTRAVENOUS | Status: DC
  Filled 2011-01-29: qty 500

## 2011-01-29 MED ORDER — ONDANSETRON HCL 4 MG/2ML IJ SOLN
4.0000 mg | Freq: Four times a day (QID) | INTRAMUSCULAR | Status: DC | PRN
  Administered 2011-01-30: 4 mg via INTRAVENOUS
  Filled 2011-01-29: qty 2

## 2011-01-29 MED ORDER — ACETAMINOPHEN 325 MG PO TABS: 650.0000 mg | ORAL_TABLET | ORAL | Status: DC | PRN

## 2011-01-29 MED ORDER — TERBUTALINE SULFATE 1 MG/ML IJ SOLN
0.2500 mg | Freq: Once | INTRAMUSCULAR | Status: AC | PRN
  Administered 2011-01-30: 0.25 mg via SUBCUTANEOUS

## 2011-01-29 MED ORDER — NALBUPHINE SYRINGE 5 MG/0.5 ML: 5.0000 mg | INJECTION | INTRAMUSCULAR | Status: DC | PRN

## 2011-01-29 MED ORDER — BUTORPHANOL TARTRATE 2 MG/ML IJ SOLN
1.0000 mg | INTRAMUSCULAR | Status: DC | PRN
  Administered 2011-01-30: 1 mg via INTRAVENOUS
  Filled 2011-01-29: qty 1

## 2011-01-29 MED ORDER — HYDROXYZINE HCL 50 MG/ML IM SOLN: 50.0000 mg | Freq: Four times a day (QID) | INTRAMUSCULAR | Status: DC | PRN

## 2011-01-29 MED ORDER — TERBUTALINE SULFATE 1 MG/ML IJ SOLN
0.2500 mg | Freq: Once | INTRAMUSCULAR | Status: AC | PRN
  Filled 2011-01-29: qty 1

## 2011-01-29 MED ORDER — FLEET ENEMA 7-19 GM/118ML RE ENEM: 1.0000 | ENEMA | RECTAL | Status: DC | PRN

## 2011-01-29 MED ORDER — OXYTOCIN 20 UNITS IN LACTATED RINGERS INFUSION - SIMPLE: 125.0000 mL/h | Freq: Once | INTRAVENOUS | Status: DC

## 2011-01-29 MED ORDER — TERBUTALINE SULFATE 1 MG/ML IJ SOLN: 0.2500 mg | Freq: Once | INTRAMUSCULAR | Status: AC | PRN

## 2011-01-29 NOTE — Progress Notes (Signed)
[name] [name] is a 25 y.o. G2P0010 at [redacted]w[redacted]d admitted for rupture of membranes, with type II DM most recently on NPH 32, R16 q qam, and NPH 22,R18 q pm. Current CBG in 120's, will be checking q 2 h til active labor. Pt uncomfortable with pressure symptoms, no bleeding.  Fhr 150 reactive Cat I. Received Cytotec at 6 pm.   Subjective:Low pain tolerance     Objective: BP 137/87  Pulse 76  Temp(Src) 98.9 F (37.2 C) (Oral)  Resp 20  Ht 5\' 1"  (1.549 m)  Wt 104.781 kg (231 lb)  BMI 43.65 kg/m2  LMP 05/13/2010      FHT:  FHR: 150 bpm, variability: moderate,  accelerations:  Present,  decelerations:  Absent UC:   Difficult to assess due to pt habitus.  SVE:1 cm long post firm, -2 vertex.  Foley bulb inserted.  Labs: Lab Results  Component Value Date   WBC 10.2 01/29/2011   HGB 10.2* 01/29/2011   HCT 31.9* 01/29/2011   MCV 79.9 01/29/2011   PLT 285 01/29/2011    Assessment / Plan: PROM,37+5 wks, Cervix unfavorable, DM type II, with EFW 20 %ile, for IOL due to prom 37.5 wk  Labor: cervical ripening at present Fetal Wellbeing:  Category I Pain Control:  I.v. meds now, will want epidural later I/D:   Anticipated MOD:  NSVD  Devlynn Knoff V 01/29/2011, 10:08 PM

## 2011-01-29 NOTE — Progress Notes (Signed)
[name] [name] is a 25 y.o. G2P0010 at [redacted]w[redacted]d admitted for rupture of membranes  Subjective: Mild contractions.  Comfortable.  Objective: BP 152/66  Pulse 82  Temp(Src) 99.1 F (37.3 C) (Oral)  Resp 18  Ht 5\' 1"  (1.549 m)  Wt 104.781 kg (231 lb)  BMI 43.65 kg/m2  LMP 05/13/2010      FHT:  FHR: 130s bpm, variability: moderate,  accelerations:  Present,  decelerations:  Absent UC:   irregular, every 6-8 minutes SVE:   Dilation: 1.5 Effacement (%): 20 Station: -2 Exam by:: [employer]: Lab Results  Component Value Date   WBC 9.1 11/18/2010   HGB 10.9* 11/18/2010   HCT 34.1* 11/18/2010   MCV 82.4 11/18/2010   PLT 395 11/18/2010    Assessment / Plan: SROM without contractions.  Labor: Will ripen cervix with misoprostol Preeclampsia:  na Fetal Wellbeing:  Category I Pain Control:  Labor support without medications I/D:  Penicillin for GBS Anticipated MOD:  NSVD  STINSON, JACOB JEHIEL 01/29/2011, 6:43 PM

## 2011-01-29 NOTE — Progress Notes (Signed)
C/O abdominal cramping this AM. Had subsided, but now feels mildly with some pressure in lower abd.

## 2011-01-30 ENCOUNTER — Encounter (HOSPITAL_COMMUNITY): Payer: Self-pay | Admitting: *Deleted

## 2011-01-30 ENCOUNTER — Ambulatory Visit (HOSPITAL_COMMUNITY): Payer: Medicaid Other

## 2011-01-30 ENCOUNTER — Encounter (HOSPITAL_COMMUNITY)
Admission: AD | Disposition: A | Discharge status: Home / Self Care | Payer: Self-pay | Source: Ambulatory Visit | Attending: Obstetrics & Gynecology

## 2011-01-30 ENCOUNTER — Encounter (HOSPITAL_COMMUNITY): Payer: Self-pay | Admitting: Anesthesiology

## 2011-01-30 ENCOUNTER — Inpatient Hospital Stay (HOSPITAL_COMMUNITY): Payer: Medicaid Other | Admitting: Anesthesiology

## 2011-01-30 DIAGNOSIS — O1002 Pre-existing essential hypertension complicating childbirth: Secondary | ICD-10-CM

## 2011-01-30 DIAGNOSIS — O2432 Unspecified pre-existing diabetes mellitus in childbirth: Secondary | ICD-10-CM

## 2011-01-30 DIAGNOSIS — O365990 Maternal care for other known or suspected poor fetal growth, unspecified trimester, not applicable or unspecified: Secondary | ICD-10-CM

## 2011-01-30 LAB — RAPID STREP SCREEN (MED CTR MEBANE ONLY): Streptococcus, Group A Screen (Direct): NEGATIVE

## 2011-01-30 LAB — GLUCOSE, CAPILLARY
Glucose-Capillary: 134 mg/dL — ABNORMAL HIGH (ref 70–99)
Glucose-Capillary: 80 mg/dL (ref 70–99)

## 2011-01-30 LAB — RPR: RPR Ser Ql: NONREACTIVE

## 2011-01-30 LAB — RH IG WORKUP (INCLUDES ABO/RH)

## 2011-01-30 LAB — POCT PREGNANCY, URINE: Preg Test, Ur: NEGATIVE

## 2011-01-30 SURGERY — Surgical Case
Anesthesia: Regional

## 2011-01-30 MED ORDER — DIBUCAINE 1 % RE OINT: 1.0000 "application " | TOPICAL_OINTMENT | RECTAL | Status: DC | PRN

## 2011-01-30 MED ORDER — LACTATED RINGERS IV SOLN
INTRAVENOUS | Status: DC
  Administered 2011-01-30: 11:00:00 via INTRAVENOUS

## 2011-01-30 MED ORDER — SODIUM CHLORIDE 0.9 % IV SOLN
1.0000 ug/kg/h | INTRAVENOUS | Status: DC | PRN
  Filled 2011-01-30: qty 2.5

## 2011-01-30 MED ORDER — DIPHENHYDRAMINE HCL 50 MG/ML IJ SOLN: 25.0000 mg | INTRAMUSCULAR | Status: DC | PRN

## 2011-01-30 MED ORDER — SODIUM CHLORIDE 0.9 % IJ SOLN: 3.0000 mL | INTRAMUSCULAR | Status: DC | PRN

## 2011-01-30 MED ORDER — LIDOCAINE-EPINEPHRINE (PF) 2 %-1:200000 IJ SOLN
INTRAMUSCULAR | Status: AC
  Filled 2011-01-30: qty 20

## 2011-01-30 MED ORDER — CEFAZOLIN SODIUM-DEXTROSE 2-3 GM-% IV SOLR
2.0000 g | INTRAVENOUS | Status: AC
  Administered 2011-01-30: 2 g via INTRAVENOUS
  Filled 2011-01-30: qty 50

## 2011-01-30 MED ORDER — MORPHINE SULFATE 0.5 MG/ML IJ SOLN
INTRAMUSCULAR | Status: AC
Start: 2011-01-30 — End: 2011-01-30
  Filled 2011-01-30: qty 10

## 2011-01-30 MED ORDER — KETOROLAC TROMETHAMINE 30 MG/ML IJ SOLN: 30.0000 mg | Freq: Four times a day (QID) | INTRAMUSCULAR | Status: AC | PRN

## 2011-01-30 MED ORDER — ONDANSETRON HCL 4 MG PO TABS: 4.0000 mg | ORAL_TABLET | ORAL | Status: DC | PRN

## 2011-01-30 MED ORDER — SENNOSIDES-DOCUSATE SODIUM 8.6-50 MG PO TABS
2.0000 | ORAL_TABLET | Freq: Every day | ORAL | Status: DC
  Administered 2011-01-30: 1 via ORAL
  Administered 2011-01-31 – 2011-02-01 (×2): 2 via ORAL

## 2011-01-30 MED ORDER — KETOROLAC TROMETHAMINE 60 MG/2ML IM SOLN
60.0000 mg | Freq: Once | INTRAMUSCULAR | Status: AC | PRN
  Administered 2011-01-30: 60 mg via INTRAMUSCULAR

## 2011-01-30 MED ORDER — SIMETHICONE 80 MG PO CHEW
80.0000 mg | CHEWABLE_TABLET | Freq: Three times a day (TID) | ORAL | Status: DC
  Administered 2011-01-30 – 2011-02-02 (×9): 80 mg via ORAL

## 2011-01-30 MED ORDER — MENTHOL 3 MG MT LOZG: 1.0000 | LOZENGE | OROMUCOSAL | Status: DC | PRN

## 2011-01-30 MED ORDER — DEXTROSE 50 % IV SOLN: 25.0000 mL | INTRAVENOUS | Status: DC | PRN

## 2011-01-30 MED ORDER — MORPHINE SULFATE (PF) 0.5 MG/ML IJ SOLN
INTRAMUSCULAR | Status: DC | PRN
  Administered 2011-01-30: 2 mg via INTRAVENOUS

## 2011-01-30 MED ORDER — ONDANSETRON HCL 4 MG/2ML IJ SOLN: 4.0000 mg | Freq: Once | INTRAMUSCULAR | Status: DC | PRN

## 2011-01-30 MED ORDER — EPHEDRINE 5 MG/ML INJ
INTRAVENOUS | Status: AC
  Filled 2011-01-30: qty 10

## 2011-01-30 MED ORDER — PHENYLEPHRINE 40 MCG/ML (10ML) SYRINGE FOR IV PUSH (FOR BLOOD PRESSURE SUPPORT): 80.0000 ug | PREFILLED_SYRINGE | INTRAVENOUS | Status: DC | PRN

## 2011-01-30 MED ORDER — EPHEDRINE 5 MG/ML INJ: 10.0000 mg | INTRAVENOUS | Status: DC | PRN

## 2011-01-30 MED ORDER — PRENATAL PLUS 27-1 MG PO TABS
1.0000 | ORAL_TABLET | Freq: Every day | ORAL | Status: DC
  Administered 2011-01-31 – 2011-02-02 (×4): 1 via ORAL
  Filled 2011-01-30 (×3): qty 1

## 2011-01-30 MED ORDER — ONDANSETRON HCL 4 MG/2ML IJ SOLN
INTRAMUSCULAR | Status: AC
  Filled 2011-01-30: qty 2

## 2011-01-30 MED ORDER — ZOLPIDEM TARTRATE 5 MG PO TABS: 5.0000 mg | ORAL_TABLET | Freq: Every evening | ORAL | Status: DC | PRN

## 2011-01-30 MED ORDER — DEXTROSE-NACL 5-0.45 % IV SOLN
INTRAVENOUS | Status: DC
  Administered 2011-01-30: 125 mL/h via INTRAVENOUS

## 2011-01-30 MED ORDER — ONDANSETRON HCL 4 MG/2ML IJ SOLN
INTRAMUSCULAR | Status: DC | PRN
  Administered 2011-01-30: 4 mg via INTRAVENOUS

## 2011-01-30 MED ORDER — DIPHENHYDRAMINE HCL 50 MG/ML IJ SOLN: 12.5000 mg | INTRAMUSCULAR | Status: DC | PRN

## 2011-01-30 MED ORDER — LACTATED RINGERS IV SOLN: 500.0000 mL | Freq: Once | INTRAVENOUS | Status: DC

## 2011-01-30 MED ORDER — PHENYLEPHRINE 40 MCG/ML (10ML) SYRINGE FOR IV PUSH (FOR BLOOD PRESSURE SUPPORT)
PREFILLED_SYRINGE | INTRAVENOUS | Status: AC
  Filled 2011-01-30: qty 5

## 2011-01-30 MED ORDER — SCOPOLAMINE 1 MG/3DAYS TD PT72
MEDICATED_PATCH | TRANSDERMAL | Status: AC
  Administered 2011-01-30: 1.5 mg via TRANSDERMAL
  Filled 2011-01-30: qty 1

## 2011-01-30 MED ORDER — DIPHENHYDRAMINE HCL 25 MG PO CAPS
25.0000 mg | ORAL_CAPSULE | ORAL | Status: DC | PRN
  Administered 2011-01-30 (×2): 25 mg via ORAL
  Filled 2011-01-30 (×2): qty 1

## 2011-01-30 MED ORDER — OXYTOCIN 20 UNITS IN LACTATED RINGERS INFUSION - SIMPLE
INTRAVENOUS | Status: DC | PRN
  Administered 2011-01-30 (×2): 20 [IU] via INTRAVENOUS

## 2011-01-30 MED ORDER — LANOLIN HYDROUS EX OINT: 1.0000 "application " | TOPICAL_OINTMENT | CUTANEOUS | Status: DC | PRN

## 2011-01-30 MED ORDER — SODIUM CHLORIDE 0.45 % IV SOLN: INTRAVENOUS | Status: DC

## 2011-01-30 MED ORDER — MORPHINE SULFATE (PF) 0.5 MG/ML IJ SOLN
INTRAMUSCULAR | Status: DC | PRN
  Administered 2011-01-30: 3 mg via EPIDURAL

## 2011-01-30 MED ORDER — NALBUPHINE HCL 10 MG/ML IJ SOLN
5.0000 mg | INTRAMUSCULAR | Status: DC | PRN
  Filled 2011-01-30: qty 1

## 2011-01-30 MED ORDER — SIMETHICONE 80 MG PO CHEW: 80.0000 mg | CHEWABLE_TABLET | ORAL | Status: DC | PRN

## 2011-01-30 MED ORDER — MEPERIDINE HCL 25 MG/ML IJ SOLN: 6.2500 mg | INTRAMUSCULAR | Status: DC | PRN

## 2011-01-30 MED ORDER — TETANUS-DIPHTH-ACELL PERTUSSIS 5-2.5-18.5 LF-MCG/0.5 IM SUSP: 0.5000 mL | Freq: Once | INTRAMUSCULAR | Status: DC

## 2011-01-30 MED ORDER — EPHEDRINE 5 MG/ML INJ
10.0000 mg | INTRAVENOUS | Status: DC | PRN
  Filled 2011-01-30: qty 4

## 2011-01-30 MED ORDER — PHENYLEPHRINE 40 MCG/ML (10ML) SYRINGE FOR IV PUSH (FOR BLOOD PRESSURE SUPPORT)
80.0000 ug | PREFILLED_SYRINGE | INTRAVENOUS | Status: DC | PRN
  Filled 2011-01-30: qty 5

## 2011-01-30 MED ORDER — OXYTOCIN 20 UNITS IN LACTATED RINGERS INFUSION - SIMPLE: 125.0000 mL/h | INTRAVENOUS | Status: AC

## 2011-01-30 MED ORDER — LACTATED RINGERS IV SOLN
INTRAVENOUS | Status: DC
  Administered 2011-01-30 (×3): via INTRAUTERINE

## 2011-01-30 MED ORDER — OXYCODONE-ACETAMINOPHEN 5-325 MG PO TABS
1.0000 | ORAL_TABLET | ORAL | Status: DC | PRN
  Filled 2011-01-30 (×2): qty 1

## 2011-01-30 MED ORDER — KETOROLAC TROMETHAMINE 30 MG/ML IJ SOLN: 15.0000 mg | Freq: Once | INTRAMUSCULAR | Status: DC | PRN

## 2011-01-30 MED ORDER — DIPHENHYDRAMINE HCL 25 MG PO CAPS: 25.0000 mg | ORAL_CAPSULE | Freq: Four times a day (QID) | ORAL | Status: DC | PRN

## 2011-01-30 MED ORDER — OXYTOCIN 10 UNIT/ML IJ SOLN
INTRAMUSCULAR | Status: AC
  Filled 2011-01-30: qty 2

## 2011-01-30 MED ORDER — GLYBURIDE 5 MG PO TABS
5.0000 mg | ORAL_TABLET | Freq: Two times a day (BID) | ORAL | Status: DC
  Administered 2011-01-30 – 2011-02-02 (×5): 5 mg via ORAL
  Filled 2011-01-30 (×8): qty 1

## 2011-01-30 MED ORDER — IBUPROFEN 600 MG PO TABS
600.0000 mg | ORAL_TABLET | Freq: Four times a day (QID) | ORAL | Status: DC
  Administered 2011-01-30 – 2011-02-02 (×10): 600 mg via ORAL
  Filled 2011-01-30 (×4): qty 1

## 2011-01-30 MED ORDER — PHENYLEPHRINE HCL 10 MG/ML IJ SOLN
INTRAMUSCULAR | Status: DC | PRN
  Administered 2011-01-30 (×3): 40 ug via INTRAVENOUS
  Administered 2011-01-30: 80 ug via INTRAVENOUS

## 2011-01-30 MED ORDER — WITCH HAZEL-GLYCERIN EX PADS: 1.0000 "application " | MEDICATED_PAD | CUTANEOUS | Status: DC | PRN

## 2011-01-30 MED ORDER — ONDANSETRON HCL 4 MG/2ML IJ SOLN: 4.0000 mg | INTRAMUSCULAR | Status: DC | PRN

## 2011-01-30 MED ORDER — NALOXONE HCL 0.4 MG/ML IJ SOLN: 0.4000 mg | INTRAMUSCULAR | Status: DC | PRN

## 2011-01-30 MED ORDER — IBUPROFEN 600 MG PO TABS
600.0000 mg | ORAL_TABLET | Freq: Four times a day (QID) | ORAL | Status: DC | PRN
  Administered 2011-02-02: 600 mg via ORAL
  Filled 2011-01-30 (×7): qty 1

## 2011-01-30 MED ORDER — SODIUM BICARBONATE 8.4 % IV SOLN
INTRAVENOUS | Status: DC | PRN
  Administered 2011-01-30: 10 mL via EPIDURAL

## 2011-01-30 MED ORDER — FENTANYL CITRATE 0.05 MG/ML IJ SOLN: 25.0000 ug | INTRAMUSCULAR | Status: DC | PRN

## 2011-01-30 MED ORDER — KETOROLAC TROMETHAMINE 60 MG/2ML IM SOLN
INTRAMUSCULAR | Status: AC
  Filled 2011-01-30: qty 2

## 2011-01-30 MED ORDER — METOCLOPRAMIDE HCL 5 MG/ML IJ SOLN: 10.0000 mg | Freq: Three times a day (TID) | INTRAMUSCULAR | Status: DC | PRN

## 2011-01-30 MED ORDER — INSULIN REGULAR HUMAN 100 UNIT/ML IJ SOLN
INTRAMUSCULAR | Status: DC
  Administered 2011-01-30: 0.8 [IU]/h via INTRAVENOUS
  Filled 2011-01-30: qty 1

## 2011-01-30 MED ORDER — SODIUM BICARBONATE 8.4 % IV SOLN
INTRAVENOUS | Status: DC | PRN
  Administered 2011-01-30: 4 mL via EPIDURAL

## 2011-01-30 MED ORDER — ONDANSETRON HCL 4 MG/2ML IJ SOLN: 4.0000 mg | Freq: Three times a day (TID) | INTRAMUSCULAR | Status: DC | PRN

## 2011-01-30 MED ORDER — SCOPOLAMINE 1 MG/3DAYS TD PT72
1.0000 | MEDICATED_PATCH | Freq: Once | TRANSDERMAL | Status: AC
  Administered 2011-01-30: 1.5 mg via TRANSDERMAL

## 2011-01-30 MED ORDER — FENTANYL 2.5 MCG/ML BUPIVACAINE 1/10 % EPIDURAL INFUSION (WH - ANES)
14.0000 mL/h | INTRAMUSCULAR | Status: DC
  Administered 2011-01-30: 12 mL/h via EPIDURAL
  Filled 2011-01-30: qty 60

## 2011-01-30 SURGICAL SUPPLY — 32 items
APL SKNCLS STERI-STRIP NONHPOA (GAUZE/BANDAGES/DRESSINGS) ×1
BENZOIN TINCTURE PRP APPL 2/3 (GAUZE/BANDAGES/DRESSINGS) ×1 IMPLANT
CLOTH BEACON ORANGE TIMEOUT ST (SAFETY) ×2 IMPLANT
DRSG COVADERM 4X10 (GAUZE/BANDAGES/DRESSINGS) ×1 IMPLANT
ELECT REM PT RETURN 9FT ADLT (ELECTROSURGICAL) ×2
ELECTRODE REM PT RTRN 9FT ADLT (ELECTROSURGICAL) ×1 IMPLANT
EXTRACTOR VACUUM KIWI (MISCELLANEOUS) IMPLANT
GLOVE BIO SURGEON ST LM GN SZ9 (GLOVE) ×2 IMPLANT
GLOVE BIOGEL PI IND STRL 9 (GLOVE) ×2 IMPLANT
GLOVE BIOGEL PI INDICATOR 9 (GLOVE) ×2
GLOVE SS N UNI LF 7.5 STRL (GLOVE) ×3 IMPLANT
GOWN PREVENTION PLUS LG XLONG (DISPOSABLE) ×3 IMPLANT
GOWN PREVENTION PLUS XLARGE (GOWN DISPOSABLE) ×3 IMPLANT
GOWN STRL REIN 3XL LVL4 (GOWN DISPOSABLE) ×2 IMPLANT
NDL HYPO 25X5/8 SAFETYGLIDE (NEEDLE) IMPLANT
NEEDLE HYPO 25X5/8 SAFETYGLIDE (NEEDLE) IMPLANT
NS IRRIG 1000ML POUR BTL (IV SOLUTION) ×2 IMPLANT
PACK C SECTION WH (CUSTOM PROCEDURE TRAY) ×2 IMPLANT
RTRCTR C-SECT PINK 25CM LRG (MISCELLANEOUS) IMPLANT
SLEEVE SCD COMPRESS KNEE MED (MISCELLANEOUS) ×1 IMPLANT
STRIP CLOSURE SKIN 1/2X4 (GAUZE/BANDAGES/DRESSINGS) ×1 IMPLANT
SUT CHROMIC 0 CTX 36 (SUTURE) ×4 IMPLANT
SUT PLAIN 2 0 (SUTURE) ×2
SUT PLAIN ABS 2-0 CT1 27XMFL (SUTURE) IMPLANT
SUT VIC AB 0 CT1 27 (SUTURE) ×2
SUT VIC AB 0 CT1 27XBRD ANBCTR (SUTURE) ×1 IMPLANT
SUT VIC AB 2-0 CT1 27 (SUTURE) ×4
SUT VIC AB 2-0 CT1 TAPERPNT 27 (SUTURE) ×2 IMPLANT
SUT VIC AB 4-0 KS 27 (SUTURE) ×2 IMPLANT
SYR BULB IRRIGATION 50ML (SYRINGE) IMPLANT
TRAY FOLEY CATH 14FR (SET/KITS/TRAYS/PACK) ×1 IMPLANT
WATER STERILE IRR 1000ML POUR (IV SOLUTION) ×2 IMPLANT

## 2011-01-30 NOTE — Progress Notes (Signed)
  See operative dictated and referenced in brief op note

## 2011-01-30 NOTE — Transfer of Care (Signed)
Immediate Anesthesia Transfer of Care Note  Patient: [name] [name]  Procedure(s) Performed:  CESAREAN SECTION  Patient Location: PACU  Anesthesia Type: Epidural  Level of Consciousness: awake, alert  and oriented  Airway & Oxygen Therapy: Patient Spontanous Breathing  Post-op Assessment: Report given to PACU RN and Post -op Vital signs reviewed and stable  Post vital signs: Reviewed and stable  Complications: No apparent anesthesia complications

## 2011-01-30 NOTE — Progress Notes (Addendum)
SUBJECTIVE: CTSP for FHR decel. Pitocin off, repositioned to left lateral, O2 mask. Epidural effective and glucostabilizer in progress. OBJECTIVE: Filed Vitals:   01/30/11 0433  BP:   Pulse: 72  Temp:   Resp:   UCs q 2-5 with UI, tracing discontinuously FHR 125 baselilne, mod variability, mild variables earlier, severe with nadir 60 x2-3 min twice recovered with above measures.  Cx large caput, 4-5/75/-3 clear fluid. IUPC and IFSE placed CBG:140  ASSESSMENT:/PLAN: Pitocin off. Will evaluate with internal monitoring, amnioinfuse  and discuss with Dr. Emelda Fear

## 2011-01-30 NOTE — Progress Notes (Signed)
Pt transported to OR

## 2011-01-30 NOTE — Brief Op Note (Signed)
01/30/2011  7:18 AM  PATIENT:  [name] [name]  25 y.o. female  PRE-OPERATIVE DIAGNOSIS:  Pregnancy, Non-reassuring Fetal Heart Rate, Diabetic  POST-OPERATIVE DIAGNOSIS:  Pregnancy, Non-reassuring Fetal Heart Rate, Diabetic  PROCEDURE:  Procedure(s): CESAREAN SECTION  SURGEON:  Surgeon(s): Tilda Burrow, MD  PHYSICIAN ASSISTANT: none  ASSISTANTS: none   ANESTHESIA:   epidural  OR FLUID I/O:     BLOOD ADMINISTERED:none  DRAINS: Urinary Catheter (Foley)   LOCAL MEDICATIONS USED:  NONE  SPECIMEN:  No Specimen  DISPOSITION OF SPECIMEN:  labor and del for placenta  COUNTS:  YES  TOURNIQUET:  * No tourniquets in log *  DICTATION: .Other Dictation: Dictation Number [id number]  PLAN OF CARE: Admit to inpatient   PATIENT DISPOSITION:  PACU - hemodynamically stable.   Delay start of Pharmacological VTE agent (>24hrs) due to surgical blood loss or risk of bleeding:  not applicable

## 2011-01-30 NOTE — Anesthesia Preprocedure Evaluation (Signed)
Anesthesia Evaluation  Name, MR# and DOB Patient awake  General Assessment Comment  Reviewed: Allergy & Precautions, H&P , Patient's Chart, lab work & pertinent test results  Airway Mallampati: II TM Distance: >3 FB Neck ROM: full    Dental  (+) Teeth Intact   Pulmonary  Pneumonia: no inhaler.  clear to auscultation  breath sounds clear to auscultation none    Cardiovascular hypertension (well controlled,H), regular Normal    Neuro/Psych   GI/Hepatic/Renal   Endo/Other  (+) Diabetes mellitus-, Type 1,  Morbid obesity  Abdominal   Musculoskeletal   Hematology   Peds  Reproductive/Obstetrics (+) Pregnancy    Anesthesia Other Findings                 Anesthesia Physical Anesthesia Plan  ASA: III  Anesthesia Plan: Epidural   Post-op Pain Management:    Induction:   Airway Management Planned:   Additional Equipment:   Intra-op Plan:   Post-operative Plan:   Informed Consent:   Plan Discussed with:   Anesthesia Plan Comments:         Anesthesia Quick Evaluation

## 2011-01-30 NOTE — Anesthesia Postprocedure Evaluation (Signed)
Anesthesia Post Note  Patient: [name] [name]  Procedure(s) Performed:  CESAREAN SECTION  Anesthesia type: Spinal  Patient location: PACU  Post pain: Pain level controlled  Post assessment: Post-op Vital signs reviewed  Last Vitals:  Filed Vitals:   01/30/11 0930  BP:   Pulse: 96  Temp: 98.4 F (36.9 C)  Resp: 20    Post vital signs: Reviewed  Level of consciousness: awake  Complications: No apparent anesthesia complications

## 2011-01-30 NOTE — H&P (Signed)
[name] [name] is a 25 y.o. G40P1011 female at [redacted]w[redacted]d presenting for cramping and pelvic pressure. Upon further questioning she reports 2 episodes of leaking clear fluid since 1100 01/29/11. She has a Hx of genital herpes, last outbreak "a while ago" prior to pregnancy. She denies prodrome. She has not been on prophylaxis. She was referred to [facility] for Type II DM. She was on glyburide initially, but switched to insulin for better control. Fasting CBGs were indequately controlled, ranging from 80-130's, while post-prandial CBGs were mostly < 120. Last [place] 01/23/11 showed normal, symetric interval growth, EFW 17th%tile, AFI ~9.5 cm.  Maternal Medical History:  Reason for admission: Reason for admission: rupture of membranes.  Contractions: irreg cramping and pressure  Fetal activity: Perceived fetal activity is normal.   Last perceived fetal movement was within the past hour.    Prenatal complications: Hypertension (Gest HTN, no meds) and infection (CT, GC, GBS UTI--all Tx).   Prenatal Complications - Diabetes: type 2. Diabetes is managed by insulin injections.      OB History    Grav Para Term Preterm Abortions TAB SAB Ect Mult Living   2 1 1  1  1   1      Past Medical History  Diagnosis Date  . Hypertension   . Herpes simplex without mention of complication   . Obesity   . Diabetes mellitus     type II  . Irregular menses   . Abnormal Pap smear   . GBS (group B streptococcus) UTI complicating pregnancy    History reviewed. No pertinent past surgical history. Family History: family history is not on file. Social History:  reports that she has never smoked. She has never used smokeless tobacco. She reports that she does not drink alcohol or use illicit drugs.  Review of Systems  Constitutional: Negative.   Eyes: Negative for blurred vision.  Gastrointestinal:       Denies epigastric pain  Neurological: Negative for headaches.   Dilation: 1 Effacement (%): 20 Station: -3 Exam by::  [place], CNM Blood pressure 123/81, pulse 94, temperature 98.4 F (36.9 C), temperature source Oral, resp. rate 20, height 5\' 1"  (1.549 m), weight 104.781 kg (231 lb), last menstrual period 05/13/2010, SpO2 98.00%,    01/29/11 1707 137/76 mmHg - - 83  - -  01/29/11 1706 - 99.1 F (37.3 C) Oral - 18  -     Maternal Exam:  Uterine Assessment: Contraction strength is mild.  Contraction frequency is rare.   Abdomen: Fundal height is S=D.   Estimated fetal weight is 6lb 8oz.   Fetal presentation: vertex Vertex presentation verified by informal [place]  Introitus: Normal vulva. Vulva is negative for lesion.  Normal vagina.  Ferning test: positive.  Amniotic fluid character: clear.  Pelvis: adequate for delivery.   Cervix: Cervix evaluated by sterile speculum exam and digital exam.     Fetal Exam Fetal Monitor Review: Mode: ultrasound.   Baseline rate: 130.  Variability: moderate (6-25 bpm).   Pattern: accelerations present and no decelerations.    Fetal State Assessment: Category I - tracings are normal.     Physical Exam  Constitutional: She is oriented to person, place, and time. She appears well-developed and well-nourished. No distress.  HENT:  Head: Normocephalic.  Eyes: Pupils are equal, round, and reactive to light.  Cardiovascular: Normal rate, regular rhythm and normal heart sounds.   Respiratory: Effort normal and breath sounds normal.  GI: Soft. Bowel sounds are normal. There is  no tenderness.  Genitourinary: Vagina normal and uterus normal. There is no rash or lesion on the right labia. There is no rash or lesion on the left labia. Uterus is not tender. Vulva exhibits no lesion. No bleeding around the vagina.       Pos pooling, perineum glistening wet  Musculoskeletal: Normal range of motion.  Neurological: She is alert and oriented to person, place, and time. She has normal reflexes.  Skin: Skin is warm and dry. She is not diaphoretic.  Psychiatric: She has  a normal mood and affect.    Prenatal labs: ABO, Rh: B/NEG/-- (03/13 2048) Antibody: NEG (03/13 2048) Rubella: 85.6 (03/13 2048) RPR: NON REACTIVE (09/19 1841)  HBsAg: NEGATIVE (03/13 2048)  HIV: NON REACTIVE (07/09 0904)  GBS: Positive (03/15 0000)   Assessment: 1. Labor: PROM at 1100. 2. Fetal Wellbeing: Category I 3. Pain Control: none 4. GBS: pos 5. 37.5 week IUP 6. Hx genital Herpes, no evidence of outbreak or prodrome 7. Type II IDDM, fairly well-controlled, but some elevated fasting CBGs.   Plan:  1. Admit to BS per consult with Dr. Debroah Loop 2. Routine L&D orders 3. Analgesia/anesthesia PRN  4. CBGs Q 4 during cervical ripening, Start Glucostabilizer in active labor 5. MD to determine method of ripening.   [name] 01/29/2011, 4:55 PM

## 2011-01-30 NOTE — Progress Notes (Signed)
[name] [name] is a 25 y.o. G2P0010 at [redacted]w[redacted]d by LMP admitted for rupture of membranes, with diabetes melltus type II, on insulin. Pt now on Glucomander. At 0.8 units perhour for CBG of 140's. Pt has developed nonreassuring FHT with recurrent prolonged decels into 80's with recovery. Beat to beat present.  Cx was 4 cm at recent check. Cesarean recommended, risks and rationale reviewed, including potential complications including bleeding infection, or injury to adjacent organs reviewed with patient who agrees with recommended cesarean delivery. Terbutalene 0.250 mg administered subcutaneously.   Subjective: As above  Objective: BP 93/56  Pulse 72  Temp(Src) 98.1 F (36.7 C) (Axillary)  Resp 20  Ht 5\' 1"  (1.549 m)  Wt 104.781 kg (231 lb)  BMI 43.65 kg/m2  SpO2 100%  LMP 05/13/2010      FHT:  FHR: 140 bpm, variability: moderate,  accelerations:  Present,  decelerations:  Present decels to 80's over 1 1/2 mins with recovery. UC:   regular, every 3 minutes SVE:   Dilation: 4.5 Effacement (%): 50 Station: -3 Exam by:: Windy Fast RN  Labs: Lab Results  Component Value Date   WBC 10.2 01/29/2011   HGB 10.2* 01/29/2011   HCT 31.9* 01/29/2011   MCV 79.9 01/29/2011   PLT 285 01/29/2011    Assessment / Plan: preg 37+6 weeks, nonreassuring fetal status, Diabetes mellitus type II,, to be taken to OR for cesarean section  Labor: stopped due to NRFHT Preeclampsia:  n/a Fetal Wellbeing:  Category II Pain Control:  epidural I/D:  n/a Anticipated MOD:  OR notified of Cesarean plan, Second OR team called in  Samy Ryner V 01/30/2011, 5:26 AM

## 2011-01-30 NOTE — Op Note (Signed)
NAME:  [name] [name], [name] [name]               ACCOUNT NO.:  [id number]  MEDICAL RECORD NO.:  [id number]  LOCATION:  [facility]                          FACILITY:  [facility]  PHYSICIAN:  Tilda Burrow, M.D. DATE OF BIRTH:  1985-07-13  DATE OF PROCEDURE: DATE OF DISCHARGE:                              OPERATIVE REPORT   PREOPERATIVE DIAGNOSES:  Pregnancy of 38-1/2 weeks' gestation, class II diabetes mellitus, labor, nonreassuring fetal heart rate status.  POSTOPERATIVE DIAGNOSES:  Pregnancy of 38-1/2 weeks' gestation, class II diabetes mellitus, labor, nonreassuring fetal heart rate status. Persistent occiput posterior presentation, small for gestational age infant, nuchal cord x1  PROCEDURE:  Primary low-transverse cervical cesarean section.  ANESTHESIA:  Epidural.  COMPLICATIONS:  None.  FINDINGS:  [name] female infant in occiput posterior presentation, clear amniotic fluid, unusually small uterus.  INDICATIONS:  A 25 year old gravida 2, para 1-0-1-1 with infant in occiput posterior presentation who developed nonreassuring fetal status during labor at 4 cm dilated, not responsive to position changes, oxygen hydration, and cessation of induction.  The patient was taken to the operating room for cesarean delivery.  Lower abdominal transverse uterine incision was performed in standard method of Pfannenstiel with easy entry of the peritoneal cavity.  Bladder flap was developed, transverse uterine incision developed, and extended laterally using index finger traction and the fetal vertex rotated into the incision and delivered easily used by its small size.  Nuchal cord x1 was released as baby was delivered.  Cord was clamped and baby passed to Dr. Francine Graven, see her notes for further details.  Placenta was delivered by Crede uterine massage and 2-layer running locking closure of the uterus using 0 chromic was performed.  Bladder flap was loosely reapproximated with running 2-0 Vicryl.  Abdominal  cavity was irrigated, anterior peritoneum closed with 2-0 Vicryl.  Fascia closed with running 0 Vicryl.  Subcu tissues were approximated with interrupted 2-0 plain and then subcuticular 4-0 Vicryl skin closure completed the procedure.  ESTIMATED BLOOD LOSS:  600 mL.  Condition of the patient to recovery room good.     Tilda Burrow, M.D.     JVF/MEDQ  D:  01/30/2011  T:  01/30/2011  Job:  [id number]

## 2011-01-30 NOTE — Progress Notes (Signed)
CBG 168. 

## 2011-01-30 NOTE — Anesthesia Procedure Notes (Addendum)
Epidural Patient location during procedure: OB Start time: 01/30/2011 3:49 AM  Staffing Anesthesiologist: Jiles Garter  Preanesthetic Checklist Completed: patient identified, site marked, surgical consent, pre-op evaluation, timeout performed, IV checked, risks and benefits discussed and monitors and equipment checked  Epidural Patient position: sitting Prep: site prepped and draped and DuraPrep Patient monitoring: continuous pulse ox and blood pressure Approach: midline Injection technique: LOR air  Needle:  Needle type: Tuohy  Needle gauge: 17 G Needle length: 9 cm Needle insertion depth: 9 cm Catheter type: closed end flexible Catheter size: 19 Gauge Test dose: negative  Assessment Events: blood not aspirated, injection not painful, no injection resistance, negative IV test and no paresthesia  Additional Notes Dosing of Epidural: 1st dose, through needle...... ( mg expressed as equavilent  cc's medication  from .1%Bupiv / fentanyl syringe from L&D pump)...............  4mg  Marcaine  2nd dose, through catheter after waiting 3 minutes.... epi 1:200K + Xylocaine 40 mg 3rd dose, through catheter, after waiting 3 minutes...Marland KitchenMarland Kitchenepi 1:200K + Xylocaine 40 mg ( 2% Xylo charted as a single dose in Epic Meds for ease of charting; actual dosing was fractionated as above, for saftey's sake)  As each dose occurred, patient was free of IV sx; and patient exhibited no evidence of SA injection.  Patient is more comfortable after epidural dosed. Please see RN's note for documentation of vital signs,and FHR which are stable.

## 2011-01-30 NOTE — Progress Notes (Signed)
Poe, CNM notified if pt status, FHR, UC pattern, SVE, and pitocin level. Will continue to monitor.

## 2011-01-31 LAB — CBC
HCT: 27.3 % — ABNORMAL LOW (ref 36.0–46.0)
MCH: 25.7 pg — ABNORMAL LOW (ref 26.0–34.0)
MCHC: 31.9 g/dL (ref 30.0–36.0)
RDW: 17.3 % — ABNORMAL HIGH (ref 11.5–15.5)

## 2011-01-31 LAB — GLUCOSE, CAPILLARY: Glucose-Capillary: 111 mg/dL — ABNORMAL HIGH (ref 70–99)

## 2011-01-31 MED ORDER — FERROUS SULFATE 325 (65 FE) MG PO TABS
325.0000 mg | ORAL_TABLET | Freq: Three times a day (TID) | ORAL | Status: DC
  Administered 2011-01-31 – 2011-02-02 (×8): 325 mg via ORAL
  Filled 2011-01-31 (×8): qty 1

## 2011-01-31 MED ORDER — ACETAMINOPHEN 500 MG PO TABS
1000.0000 mg | ORAL_TABLET | Freq: Four times a day (QID) | ORAL | Status: DC | PRN
  Administered 2011-01-31 – 2011-02-02 (×3): 1000 mg via ORAL
  Filled 2011-01-31: qty 2
  Filled 2011-01-31: qty 1
  Filled 2011-01-31: qty 2
  Filled 2011-01-31: qty 1

## 2011-01-31 NOTE — Progress Notes (Signed)
UR Chart review completed.  

## 2011-01-31 NOTE — Anesthesia Postprocedure Evaluation (Signed)
  Anesthesia Post-op Note  Patient: [name] [name]  Procedure(s) Performed:  CESAREAN SECTION  Patient Location: PACU and Mother/Baby  Anesthesia Type: Epidural  Level of Consciousness: awake and alert   Airway and Oxygen Therapy: Patient Spontanous Breathing  Post-op Pain: none  Post-op Assessment: Post-op Vital signs reviewed and Patient's Cardiovascular Status Stable  Post-op Vital Signs: Reviewed and stable  Complications: No apparent anesthesia complications

## 2011-01-31 NOTE — Progress Notes (Signed)
Subjective: Postpartum Day 1: Cesarean Delivery Patient reports tolerating PO, + flatus and no problems voiding. No BM yet.  Up ad lib.  Breast and bottle feeding.  Nexplanon for birth control.  Objective: Vital signs in last 24 hours: Temp:  [98 F (36.7 C)-99.1 F (37.3 C)] 98.5 F (36.9 C) (09/21 0345) Pulse Rate:  [87-126] 90  (09/21 0345) Resp:  [16-20] 18  (09/21 0345) BP: (102-127)/(32-82) 104/60 mmHg (09/21 0345) SpO2:  [97 %-100 %] 100 % (09/21 0345)  Physical Exam:  General: alert, cooperative, appears stated age, no distress and moderately obese Lochia: appropriate Uterine Fundus: firm Incision: healing well, no significant drainage, no dehiscence, no significant erythema DVT Evaluation: No evidence of DVT seen on physical exam. Negative Homan's sign. 2+ bilateral pedal edema to knees   Basename 01/31/11 0555 01/29/11 1841  HGB 8.7* 10.2*  HCT 27.3* 31.9*  CBGs: 80-149 in last 24hrs  Assessment/Plan: Status post Cesarean section. Doing well postoperatively.  Continue current care. DMII - controlled with glyburide Will add ferrous sulfate TID for anemia.  BOOTH, Seraphina Mitchner 01/31/2011, 7:43 AM

## 2011-01-31 NOTE — Progress Notes (Signed)
Encounter addended by: Cephus Shelling on: 01/31/2011  2:15 PM<BR>     Documentation filed: Notes Section

## 2011-02-01 LAB — GLUCOSE, CAPILLARY: Glucose-Capillary: 86 mg/dL (ref 70–99)

## 2011-02-01 MED ORDER — IBUPROFEN 800 MG PO TABS: 800.0000 mg | ORAL_TABLET | Freq: Three times a day (TID) | ORAL | Status: AC | PRN

## 2011-02-01 MED ORDER — GLYBURIDE 5 MG PO TABS: 5.0000 mg | ORAL_TABLET | Freq: Two times a day (BID) | ORAL | Status: DC

## 2011-02-01 MED ORDER — FERROUS SULFATE 325 (65 FE) MG PO TABS: 325.0000 mg | ORAL_TABLET | Freq: Two times a day (BID) | ORAL | Status: DC

## 2011-02-01 MED ORDER — PRENATAL RX 60-1 MG PO TABS: 1.0000 | ORAL_TABLET | Freq: Every day | ORAL | Status: AC

## 2011-02-01 MED ORDER — OXYCODONE-ACETAMINOPHEN 5-325 MG PO TABS: 1.0000 | ORAL_TABLET | ORAL | Status: AC | PRN

## 2011-02-01 NOTE — Progress Notes (Signed)
Subjective: Postpartum Day 2, POD#2: Cesarean Delivery Patient reports incisional pain, tolerating PO, + flatus and no problems voiding.  Tearful and states she wants to go home today.  Objective: Vital signs in last 24 hours: Temp:  [98 F (36.7 C)-98.9 F (37.2 C)] 98.1 F (36.7 C) (09/22 0612) Pulse Rate:  [84-103] 103  (09/22 0612) Resp:  [18-20] 20  (09/22 0612) BP: (116-142)/(64-87) 142/87 mmHg (09/22 0612)  Physical Exam:  General: alert and mild distress (tearful) Heart: RRR, no murmur Lungs: CTA B/L Abd: +BS, soft, ttp Lochia: appropriate Uterine Fundus: firm @ umbilicus Incision: healing well DVT Evaluation: No evidence of DVT seen on physical exam.   Basename 01/31/11 0555 01/29/11 1841  HGB 8.7* 10.2*  HCT 27.3* 31.9*    Assessment/Plan: Status post Cesarean section. Doing well postoperatively.  Discharge home with standard precautions and return to clinic in 4-6 weeks. Formula feeding. Desires Implenon.  Deshaun Weisinger N 02/01/2011, 7:52 AM

## 2011-02-01 NOTE — Discharge Summary (Addendum)
Obstetric Discharge Summary Reason for Admission: rupture of membranes Prenatal Procedures: NST and ultrasound Intrapartum Procedures: cesarean: low cervical, transverse Postpartum Procedures: none and Rho(D) Ig Complications-Operative and Postpartum: none Hemoglobin  Date Value Range Status  01/31/2011 8.7* 12.0-15.0 (g/dL) Final     HCT  Date Value Range Status  01/31/2011 27.3* 36.0-46.0 (%) Final    Discharge Diagnoses: Term Pregnancy-delivered, Type 2 Diabetes; Insulin Dependent GDM (delivered)  Discharge Information: Date: 02/01/2011 Activity: pelvic rest Diet: Diabetic Diet Medications: PNV, Ibuprophen, Colace, Iron, Percocet and Glyburide Condition: stable Instructions: See printout Discharge to: home   Newborn Data: Live born female  Birth Weight: 4 lb 13.3 oz (2190 g) APGAR: 9, 9  Home with mother.  [name] [name] 02/01/2011, 7:54 AM  The baby is not being discharged today, so discharge will be cancelled

## 2011-02-02 LAB — GLUCOSE, CAPILLARY: Glucose-Capillary: 86 mg/dL (ref 70–99)

## 2011-02-02 MED ORDER — FERROUS SULFATE 325 (65 FE) MG PO TABS: 325.0000 mg | ORAL_TABLET | Freq: Three times a day (TID) | ORAL | Status: DC

## 2011-02-02 MED ORDER — OXYCODONE-ACETAMINOPHEN 5-325 MG PO TABS: 1.0000 | ORAL_TABLET | ORAL | Status: AC | PRN

## 2011-02-02 NOTE — Progress Notes (Signed)
Subjective: Postpartum Day 3: Cesarean Delivery Patient reports tolerating PO, + flatus, + BM and no problems voiding.    Objective: Vital signs in last 24 hours: Temp:  [98.4 F (36.9 C)-98.6 F (37 C)] 98.4 F (36.9 C) (09/23 0556) Pulse Rate:  [88-90] 90  (09/23 0556) Resp:  [18-19] 18  (09/23 0556) BP: (113-136)/(72-86) 113/72 mmHg (09/23 0556)  Physical Exam:  General: alert, cooperative, appears stated age and no distress Lochia: appropriate Uterine Fundus: firm Incision: healing well, no significant drainage, no dehiscence, no significant erythema DVT Evaluation: No evidence of DVT seen on physical exam. Negative Homan's sign. No cords or calf tenderness. No significant calf/ankle edema.   Basename 01/31/11 0555  HGB 8.7*  HCT 27.3*    Assessment/Plan: Status post Cesarean section. Doing well postoperatively.  Discharge home with standard precautions and return to clinic in 4-6 weeks.  [name] [name] 02/02/2011, 10:01 AM

## 2011-02-03 ENCOUNTER — Other Ambulatory Visit: Payer: Medicaid Other

## 2011-02-06 ENCOUNTER — Ambulatory Visit (HOSPITAL_COMMUNITY): Payer: Medicaid Other

## 2011-02-06 LAB — POCT PREGNANCY, URINE
Operator id: 247071
Preg Test, Ur: NEGATIVE

## 2011-02-06 LAB — POCT URINALYSIS DIP (DEVICE)
Bilirubin Urine: NEGATIVE
Glucose, UA: NEGATIVE
Hgb urine dipstick: NEGATIVE
Nitrite: NEGATIVE
Operator id: 247071
Urobilinogen, UA: 0.2

## 2011-02-07 ENCOUNTER — Inpatient Hospital Stay (HOSPITAL_COMMUNITY): Admission: RE | Admit: 2011-02-07 | Payer: Medicaid Other | Source: Ambulatory Visit

## 2011-02-10 ENCOUNTER — Encounter (HOSPITAL_COMMUNITY): Payer: Self-pay | Admitting: Obstetrics and Gynecology

## 2011-02-16 ENCOUNTER — Encounter (HOSPITAL_COMMUNITY): Payer: Self-pay

## 2011-02-16 ENCOUNTER — Inpatient Hospital Stay (HOSPITAL_COMMUNITY)
Admission: AD | Admit: 2011-02-16 | Discharge: 2011-02-16 | Disposition: A | Discharge status: Home / Self Care | Payer: Medicaid Other | Source: Ambulatory Visit | Attending: Obstetrics and Gynecology | Admitting: Obstetrics and Gynecology

## 2011-02-16 DIAGNOSIS — O909 Complication of the puerperium, unspecified: Secondary | ICD-10-CM | POA: Insufficient documentation

## 2011-02-16 DIAGNOSIS — Z4800 Encounter for change or removal of nonsurgical wound dressing: Secondary | ICD-10-CM

## 2011-02-16 DIAGNOSIS — IMO0001 Reserved for inherently not codable concepts without codable children: Secondary | ICD-10-CM

## 2011-02-16 NOTE — ED Provider Notes (Signed)
History   In with c/o incision seperating.  Chief Complaint  Patient presents with  . Wound Check   HPI  OB History    Grav Para Term Preterm Abortions TAB SAB Ect Mult Living   2 1 1  1  1   1       Past Medical History  Diagnosis Date  . Hypertension   . Herpes simplex without mention of complication   . Obesity   . Diabetes mellitus     type II  . Irregular menses   . Abnormal Pap smear   . GBS (group B streptococcus) UTI complicating pregnancy     Past Surgical History  Procedure Date  . Cesarean section 01/30/2011    Procedure: CESAREAN SECTION;  Surgeon: Tilda Burrow, MD;  Location: [facility];  Service: Gynecology;  Laterality: N/A;    No family history on file.  History  Substance Use Topics  . Smoking status: Never Smoker   . Smokeless tobacco: Never Used  . Alcohol Use: No    Allergies: No Known Allergies  Prescriptions prior to admission  Medication Sig Dispense Refill  . Elastic Bandages & Supports (FUTURO LEFT HAND WRIST BRACE) MISC by Does not apply route. To support wrist sprian       . ferrous sulfate (FERROUSUL) 325 (65 FE) MG tablet Take 1 tablet (325 mg total) by mouth 2 (two) times daily at 10 AM and 5 PM.  60 tablet  11  . ferrous sulfate 325 (65 FE) MG tablet Take 1 tablet (325 mg total) by mouth 3 (three) times daily with meals.  30 tablet  3  . glyBURIDE (DIABETA) 5 MG tablet Take 10 mg by mouth 2 (two) times daily with a meal.       . glyBURIDE (DIABETA) 5 MG tablet Take 1 tablet (5 mg total) by mouth 2 (two) times daily with a meal.  60 tablet  1  . Prenatal Vit-Fe Fumarate-FA (PRENATAL MULTIVITAMIN) 60-1 MG tablet Take 1 tablet by mouth daily.  100 tablet  11  . Prenatal Vit-Fe Psac Cmplx-FA (PRENATAL MULTIVITAMIN) 60-1 MG tablet Take 1 tablet by mouth daily with breakfast.         Review of Systems  Constitutional: Negative.   HENT: Negative.   Eyes: Negative.   Respiratory: Negative.   Cardiovascular: Negative.   Gastrointestinal:  Negative.   Genitourinary: Negative.   Musculoskeletal: Negative.   Skin: Negative.   Neurological: Negative.   Endo/Heme/Allergies: Negative.   Psychiatric/Behavioral: Negative.    Physical Exam   Blood pressure 118/79, pulse 88, temperature 98.3 F (36.8 C), temperature source Oral, resp. rate 16, not currently breastfeeding.  Physical Exam  Constitutional: She is oriented to person, place, and time. She appears well-developed and well-nourished.  HENT:  Head: Normocephalic.  Neck: Normal range of motion.  Cardiovascular: Normal rate and regular rhythm.   Respiratory: Effort normal and breath sounds normal.  GI: Soft. Bowel sounds are normal.  Genitourinary: Vagina normal and uterus normal.  Musculoskeletal: Normal range of motion.  Neurological: She is alert and oriented to person, place, and time. She has normal reflexes.  Skin: Skin is warm and dry.  Psychiatric: She has a normal mood and affect. Her behavior is normal. Judgment and thought content normal.    MAU Course  Procedures  MDM   Assessment and Plan  Well healing low transverse incision. Cleaned with peroxide, no redness swelling or drainage.  Zerita Boers 02/16/2011, 2:28 PM

## 2011-02-16 NOTE — Progress Notes (Signed)
Had C/S on 9/20 incision on right side felt like it opened and thinks draining.

## 2011-02-16 NOTE — ED Notes (Signed)
Incision was cleaned with peroxide by Zerita Boers CNM, instructions for keeping incision clean and dry were reviewed with patient.

## 2011-03-07 ENCOUNTER — Encounter: Payer: Self-pay | Admitting: Physician Assistant

## 2011-03-07 ENCOUNTER — Ambulatory Visit (INDEPENDENT_AMBULATORY_CARE_PROVIDER_SITE_OTHER): Payer: Medicaid Other | Admitting: Physician Assistant

## 2011-03-07 VITALS — BP 119/80 | HR 85 | Temp 97.8°F | Ht 61.0 in | Wt 207.4 lb

## 2011-03-07 DIAGNOSIS — O99345 Other mental disorders complicating the puerperium: Secondary | ICD-10-CM

## 2011-03-07 NOTE — Progress Notes (Signed)
  Subjective:     [name] [name] is a 25 y.o. female who presents for a postpartum visit. She is 5 weeks postpartum following a low cervical transverse Cesarean section. I have fully reviewed the prenatal and intrapartum course. The delivery was at 38.5 gestational weeks. Outcome: primary cesarean section, low transverse incision. Anesthesia: epidural. Postpartum course has been uncomplicated. Baby's course has been uncomplicate. Baby is feeding by bottle - Carnation Good Start. Bleeding no bleeding and LMP 1 week ago. Bowel function is normal. Bladder function is normal. Patient is not sexually active. Contraception method is desires Nexplanon. FBS 80-85, Postpartum depression screening: positive.  The following portions of the patient's history were reviewed and updated as appropriate: allergies, current medications, past family history, past medical history, past social history, past surgical history and problem list.  Review of Systems Pertinent items are noted in HPI.   Objective:    BP 119/80  Pulse 85  Temp(Src) 97.8 F (36.6 C) (Oral)  Ht 5\' 1"  (1.549 m)  Wt 207 lb 6.4 oz (94.076 kg)  BMI 39.19 kg/m2  LMP 03/03/2011  Breastfeeding? No  General:  alert and cooperative   Breasts:  inspection negative, no nipple discharge or bleeding, no masses or nodularity palpable  Abdomen: soft, non-tender; bowel sounds normal; no masses,  no organomegaly and obese       Discussed with pt findings of [place] screen. States anxiety and depression r/t financial situation. Unable to find a job. Lives with FOB who does work. Reports good support system from family and FOB. Declines medications or referral for counseling. Declines SW visit today. Community resources reviewed and understood by pt. Denies SI. Assessment:    5 week postpartum exam. Pap smear not done at today's visit.  Diabetes Type II on Glyberide 5mg  BID   Plan:    1. Contraception: none, at present desires nexplanon 2.  Continue Glyberide 3. Referral to PCP, Lloyd Huger at [facility] for DM management and Nexplanon placement 3. Follow up as needed.

## 2011-03-25 ENCOUNTER — Encounter: Payer: Self-pay | Admitting: Family Medicine

## 2011-03-25 ENCOUNTER — Ambulatory Visit (INDEPENDENT_AMBULATORY_CARE_PROVIDER_SITE_OTHER): Payer: Medicaid Other | Admitting: Family Medicine

## 2011-03-25 VITALS — BP 144/88 | HR 86 | Ht 61.0 in | Wt 208.0 lb

## 2011-03-25 DIAGNOSIS — Z309 Encounter for contraceptive management, unspecified: Secondary | ICD-10-CM

## 2011-03-25 DIAGNOSIS — E119 Type 2 diabetes mellitus without complications: Secondary | ICD-10-CM

## 2011-03-25 DIAGNOSIS — I10 Essential (primary) hypertension: Secondary | ICD-10-CM

## 2011-03-25 DIAGNOSIS — Z3046 Encounter for surveillance of implantable subdermal contraceptive: Secondary | ICD-10-CM

## 2011-03-25 DIAGNOSIS — E669 Obesity, unspecified: Secondary | ICD-10-CM

## 2011-03-25 DIAGNOSIS — Z30017 Encounter for initial prescription of implantable subdermal contraceptive: Secondary | ICD-10-CM

## 2011-03-25 LAB — POCT GLYCOSYLATED HEMOGLOBIN (HGB A1C): Hemoglobin A1C: 6.3

## 2011-03-25 NOTE — Patient Instructions (Signed)
Nice to see you again. Use condoms for next 2 weeks if you have sex. Make an appointment in one month to discuss diabetes. Continue taking glyburide and we will recheck your blood pressure at next visit.  Etonogestrel implant What is this medicine? ETONOGESTREL is a contraceptive (birth control) device. It is used to prevent pregnancy. It can be used for up to 3 years. This medicine may be used for other purposes; ask your health care provider or pharmacist if you have questions. What should I tell my health care provider before I take this medicine? They need to know if you have any of these conditions: -abnormal vaginal bleeding -blood vessel disease or blood clots -cancer of the breast, cervix, or liver -depression -diabetes -gallbladder disease -headaches -heart disease or recent heart attack -high blood pressure -high cholesterol -kidney disease -liver disease -renal disease -seizures -tobacco smoker -an unusual or allergic reaction to etonogestrel, other hormones, anesthetics or antiseptics, medicines, foods, dyes, or preservatives -pregnant or trying to get pregnant -breast-feeding How should I use this medicine? This device is inserted just under the skin on the inner side of your upper arm by a health care professional. Talk to your pediatrician regarding the use of this medicine in children. Special care may be needed. Overdosage: If you think you've taken too much of this medicine contact a poison control center or emergency room at once. Overdosage: If you think you have taken too much of this medicine contact a poison control center or emergency room at once. NOTE: This medicine is only for you. Do not share this medicine with others. What if I miss a dose? This does not apply. What may interact with this medicine? Do not take this medicine with any of the following medications: -amprenavir -bosentan -fosamprenavir This medicine may also interact with the  following medications: -barbiturate medicines for inducing sleep or treating seizures -certain medicines for fungal infections like ketoconazole and itraconazole -griseofulvin -medicines to treat seizures like carbamazepine, felbamate, oxcarbazepine, phenytoin, topiramate -modafinil -phenylbutazone -rifampin -some medicines to treat HIV infection like atazanavir, indinavir, lopinavir, nelfinavir, tipranavir, ritonavir -St. John's wort This list may not describe all possible interactions. Give your health care provider a list of all the medicines, herbs, non-prescription drugs, or dietary supplements you use. Also tell them if you smoke, drink alcohol, or use illegal drugs. Some items may interact with your medicine. What should I watch for while using this medicine? This product does not protect you against HIV infection (AIDS) or other sexually transmitted diseases. You should be able to feel the implant by pressing your fingertips over the skin where it was inserted. Tell your doctor if you cannot feel the implant. What side effects may I notice from receiving this medicine? Side effects that you should report to your doctor or health care professional as soon as possible: -allergic reactions like skin rash, itching or hives, swelling of the face, lips, or tongue -breast lumps -changes in vision -confusion, trouble speaking or understanding -dark urine -depressed mood -general ill feeling or flu-like symptoms -light-colored stools -loss of appetite, nausea -right upper belly pain -severe headaches -severe pain, swelling, or tenderness in the abdomen -shortness of breath, chest pain, swelling in a leg -signs of pregnancy -sudden numbness or weakness of the face, arm or leg -trouble walking, dizziness, loss of balance or coordination -unusual vaginal bleeding, discharge -unusually weak or tired -yellowing of the eyes or skin Side effects that usually do not require medical  attention (Report these to your  doctor or health care professional if they continue or are bothersome.): -acne -breast pain -changes in weight -cough -fever or chills -headache -irregular menstrual bleeding -itching, burning, and vaginal discharge -pain or difficulty passing urine -sore throat This list may not describe all possible side effects. Call your doctor for medical advice about side effects. You may report side effects to FDA at 1-800-FDA-1088. Where should I keep my medicine? This drug is given in a hospital or clinic and will not be stored at home. NOTE: This sheet is a summary. It may not cover all possible information. If you have questions about this medicine, talk to your doctor, pharmacist, or health care provider.  2012, [employer]vier/Gold Standard. (01/19/2009 3:54:17 PM)

## 2011-03-26 ENCOUNTER — Encounter: Payer: Self-pay | Admitting: Family Medicine

## 2011-03-26 DIAGNOSIS — Z3046 Encounter for surveillance of implantable subdermal contraceptive: Secondary | ICD-10-CM

## 2011-03-26 DIAGNOSIS — Z30017 Encounter for initial prescription of implantable subdermal contraceptive: Secondary | ICD-10-CM

## 2011-03-26 MED ORDER — ETONOGESTREL 68 MG ~~LOC~~ IMPL
68.0000 mg | DRUG_IMPLANT | Freq: Once | SUBCUTANEOUS | Status: AC
  Administered 2011-03-26: 68 mg via SUBCUTANEOUS

## 2011-03-26 NOTE — Progress Notes (Signed)
  Subjective:     [name] [name] is a 25 y.o. female who presents for a postpartum followup. She is 6 weeks postpartum following a low cervical transverse Cesarean section for NRFHT. Was followed by high-risk clinic for DM-2 and has followed up last week for routine visit, planning on implanon insertion. I have fully reviewed the prenatal and intrapartum course. The delivery was at 38.5 gestational weeks. Outcome: primary cesarean section, low transverse incision. Anesthesia: epidural. Postpartum course has been uncomplicated. Baby's course has been uncomplicated. Baby is feeding by bottle - Carnation Good Start. Bleeding no bleeding. Bowel function is normal. Bladder function is normal. Patient is not sexually active. Contraception method is none. Postpartum depression screening: negative.  The following portions of the patient's history were reviewed and updated as appropriate: allergies, current medications, past family history, past medical history, past social history, past surgical history and problem list.  Has history of noncompliance with OCPs last year and had pelvic infections with an IUD removed prior to pregnancy. Discussed implanon including risk of abnormal bleeding and other side effects similar to combined OCPs. Patient desires 3 yr contraception. Questions and concerns addressed. Agrees to placement today.   Review of Systems Pertinent items are noted in HPI.   Objective:    BP 144/88  Pulse 86  Ht 5\' 1"  (1.549 m)  Wt 94.348 kg (208 lb)  BMI 39.30 kg/m2  LMP 03/03/2011  General:  alert, cooperative and appears stated age  Lungs: clear to auscultation bilaterally  Heart:  regular rate and rhythm, S1, S2 normal, no murmur, click, rub or gallop  Abdomen: soft, non-tender; bowel sounds normal; no masses,  no organomegaly        MSK: no edema or leg tenderness Assessment:    6 week postpartum exam. DM-2, stable post-partum.  Plan:    1. Contraception: Implanon placed  today. Time out performed, consent obtained. Left arm sterilized with betadine and alcohol swabs, 1% lido w/ epi for local anesthetic. Implanon device inserted into left arm 8cm proximal to medial epicondyle humerus. Hemostasis acheived with pressure dressing. Device palpated beneath skin after insertion.  Implanon Device Lot no: [id number]  2. Hx of HTN. Blood pressure has been controlled without medication during pregnancy. Will follow up in clinic in 3-4 weeks to discuss need for antihypertensives with hx of HTN. Has used ACEi in the past.  3. DM. Will check A1c postpartum. Seems well controlled with glyburide 5mg  BID currently. No hypoglycemic episodes. F/u in one month.   4. Obesity. Near pre-pregnancy weight currently. Pt to continue exercise daily. F/u at next visit.

## 2011-03-26 NOTE — Assessment & Plan Note (Signed)
Weight near pre-pregnancy level. Encouraged loss and patient plans to loss more, has been exercising daily.

## 2011-03-26 NOTE — Assessment & Plan Note (Signed)
Controlled off medication during pregnancy. WIll f/u in next 3-4 weeks after implanon insertion to evaluate. May need to restart medication for goal <130/80 in setting of diabetes.

## 2011-12-24 ENCOUNTER — Encounter (HOSPITAL_COMMUNITY): Payer: Self-pay | Admitting: *Deleted

## 2011-12-24 ENCOUNTER — Emergency Department (INDEPENDENT_AMBULATORY_CARE_PROVIDER_SITE_OTHER)
Admission: EM | Admit: 2011-12-24 | Discharge: 2011-12-24 | Disposition: A | Discharge status: Home / Self Care | Payer: Self-pay | Source: Home / Self Care | Attending: Family Medicine | Admitting: Family Medicine

## 2011-12-24 DIAGNOSIS — L03019 Cellulitis of unspecified finger: Secondary | ICD-10-CM

## 2011-12-24 DIAGNOSIS — B373 Candidiasis of vulva and vagina: Secondary | ICD-10-CM

## 2011-12-24 DIAGNOSIS — E119 Type 2 diabetes mellitus without complications: Secondary | ICD-10-CM

## 2011-12-24 DIAGNOSIS — IMO0001 Reserved for inherently not codable concepts without codable children: Secondary | ICD-10-CM

## 2011-12-24 LAB — GLUCOSE, CAPILLARY: Glucose-Capillary: 343 mg/dL — ABNORMAL HIGH (ref 70–99)

## 2011-12-24 LAB — POCT URINALYSIS DIP (DEVICE)
Glucose, UA: >=1000 mg/dL — AB
Nitrite: NEGATIVE
Protein, ur: NEGATIVE mg/dL
Specific Gravity, Urine: 1.015 (ref 1.005–1.030)
Urobilinogen, UA: 0.2 mg/dL (ref 0.0–1.0)

## 2011-12-24 MED ORDER — CEPHALEXIN 500 MG PO CAPS: 500.0000 mg | ORAL_CAPSULE | Freq: Two times a day (BID) | ORAL | Status: AC

## 2011-12-24 MED ORDER — FLUCONAZOLE 150 MG PO TABS: ORAL_TABLET | ORAL | Status: DC

## 2011-12-24 MED ORDER — NYSTATIN 100000 UNIT/GM EX CREA: TOPICAL_CREAM | Freq: Two times a day (BID) | CUTANEOUS | Status: DC

## 2011-12-24 MED ORDER — GLYBURIDE 5 MG PO TABS: 5.0000 mg | ORAL_TABLET | Freq: Two times a day (BID) | ORAL | Status: DC

## 2011-12-24 MED ORDER — METFORMIN HCL 1000 MG PO TABS: 1000.0000 mg | ORAL_TABLET | Freq: Two times a day (BID) | ORAL | Status: DC

## 2011-12-24 NOTE — ED Provider Notes (Signed)
History     CSN: [id number]  Arrival date & time 12/24/11  1206   First MD Initiated Contact with Patient 12/24/11 1324      Chief Complaint  Patient presents with  . Hand Pain    (Consider location/radiation/quality/duration/timing/severity/associated sxs/prior treatment) HPI Comments: 26 year old female with history of diabetes type 2. Here complaining of: #1 pain and swelling in her left fourth finger around the nail  For about 3 days. No self drainage. #2 vaginal itchiness and burning sensation for about a week. No vaginal discharge. Denies pelvic pain. States she has not been sexually active in the last 4 months not concerned about sexually transmitted diseases.      Past Medical History  Diagnosis Date  . Hypertension   . Herpes simplex without mention of complication   . Obesity   . Diabetes mellitus     type II  . Irregular menses   . Abnormal Pap smear   . GBS (group B streptococcus) UTI complicating pregnancy     Past Surgical History  Procedure Date  . Cesarean section 01/30/2011    Procedure: CESAREAN SECTION;  Surgeon: Tilda Burrow, MD;  Location: [facility];  Service: Gynecology;  Laterality: N/A;    Family History  Problem Relation Age of Onset  . Diabetes Paternal Grandmother   . Hypertension Paternal Grandmother   . Diabetes Maternal Grandmother   . Diabetes Father   . Cancer Paternal Uncle     History  Substance Use Topics  . Smoking status: Never Smoker   . Smokeless tobacco: Never Used  . Alcohol Use: No    OB History    Grav Para Term Preterm Abortions TAB SAB Ect Mult Living   2 1 1  1  1   1       Review of Systems  Constitutional: Negative for fever, chills, activity change and appetite change.  Respiratory: Negative for shortness of breath.   Gastrointestinal: Negative for nausea, vomiting and abdominal pain.  Genitourinary: Negative for dysuria, urgency, frequency, hematuria, flank pain, vaginal bleeding, vaginal discharge and  pelvic pain.  Musculoskeletal:       Finger swelling and redness as per HPI.  Skin: Positive for rash.       Vulvar /vaginal redness as per HPI  Neurological: Negative for dizziness and headaches.    Allergies  Review of patient's allergies indicates no known allergies.  Home Medications   Current Outpatient Rx  Name Route Sig Dispense Refill  . CEPHALEXIN 500 MG PO CAPS Oral Take 1 capsule (500 mg total) by mouth 2 (two) times daily. 14 capsule 0  . FUTURO LEFT HAND WRIST BRACE MISC Does not apply by Does not apply route. To support wrist sprian     . FERROUS SULFATE 325 (65 FE) MG PO TABS Oral Take 1 tablet (325 mg total) by mouth 2 (two) times daily at 10 AM and 5 PM. 60 tablet 11  . FERROUS SULFATE 325 (65 FE) MG PO TABS Oral Take 1 tablet (325 mg total) by mouth 3 (three) times daily with meals. 30 tablet 3  . FLUCONAZOLE 150 MG PO TABS  1 tab by mouth q. 72 hours x4 4 tablet 0  . GLYBURIDE 5 MG PO TABS Oral Take 1 tablet (5 mg total) by mouth 2 (two) times daily with a meal. 60 tablet 0  . METFORMIN HCL 1000 MG PO TABS Oral Take 1 tablet (1,000 mg total) by mouth 2 (two) times daily. 60 tablet 0  .  NYSTATIN 100000 UNIT/GM EX CREA Topical Apply topically 2 (two) times daily. 30 g 0  . PRENATAL RX 60-1 MG PO TABS Oral Take 1 tablet by mouth daily. 100 tablet 11    BP 146/75  Pulse 82  Temp 98.7 F (37.1 C) (Oral)  Resp 18  SpO2 100%  Physical Exam  Nursing note and vitals reviewed. Constitutional: She is oriented to person, place, and time. She appears well-developed and well-nourished. No distress.  HENT:  Head: Normocephalic and atraumatic.  Cardiovascular: Normal heart sounds.   Pulmonary/Chest: Breath sounds normal.  Abdominal: Soft. There is no tenderness.  Genitourinary:       vulvovaginal pruriginous erythema, swelling and scoriations. No leucorrhea.  Musculoskeletal:       Left 4th finger with lateral paronychia obvious purulent collection. No self drainage. No  involvement of digital pad.  Intact distal phalange sensation and DIPJ ROM.   Neurological: She is alert and oriented to person, place, and time.    ED Course  INCISION AND DRAINAGE Performed by: Sharin Grave Authorized by: Sharin Grave Consent: Verbal consent obtained. Risks and benefits: risks, benefits and alternatives were discussed Consent given by: patient Patient understanding: patient states understanding of the procedure being performed Patient consent: the patient's understanding of the procedure matches consent given Type: abscess Body area: upper extremity Location details: left ring finger Anesthesia: local infiltration and digital block Local anesthetic: lidocaine 1% without epinephrine Scalpel size: 11 Incision type: single straight Complexity: simple Drainage: purulent Drainage amount: scant Patient tolerance: Patient tolerated the procedure well with no immediate complications. Comments: Antibiotic ointment and dry dressing applied.    (including critical care time)  Labs Reviewed  POCT URINALYSIS DIP (DEVICE) - Abnormal; Notable for the following:    Glucose, UA >=1000 (*)     All other components within normal limits  GLUCOSE, CAPILLARY - Abnormal; Notable for the following:    Glucose-Capillary 343 (*)     All other components within normal limits  POCT PREGNANCY, URINE  CULTURE, ROUTINE-ABSCESS   No results found.   1. Paronychia of fourth finger of left hand   2. Candidal vulvovaginitis   3. Diabetes mellitus       MDM  Lateral paronychia of the left fourth finger status post incision and drainage today. Prescribed cephalexin oral; wound care instructions discussed with patient and provided in writing. Patient diabetes is poorly controlled decided to increase her metformin to 1 g twice a day and restart her glyburide as previously prescribed. Vulvovaginal candidiasis likely related to poorly controlled diabetes prescribed Diflucan  orally and topical nystatin. Patient was asked to followup at [facility]  next week to monitor her symptoms and discuss diabetes management.       Sharin Grave, MD 12/26/11 1245

## 2011-12-24 NOTE — ED Notes (Signed)
Pt  Reports   painfull  Tender  Area  To  l   Ring   Finger  X  Several  Days  -  Woke  Up  sev   Days  With  The  Symptoms  denys  Any  specefic  Injury      -  Also  Reports   A  Rash  On  Vaginal  Area         denys  Any  Discharge

## 2011-12-27 LAB — CULTURE, ROUTINE-ABSCESS

## 2011-12-29 NOTE — ED Notes (Signed)
Abscess culture L finger: Abundant Staph. Aureus.  Pt. Treated with Keflex (not on sensitivity report) and had I and D.  Lab shown to Dr. Tressia Danas and she said it was OK. [name] 12/29/2011

## 2012-01-09 ENCOUNTER — Ambulatory Visit: Payer: Self-pay | Admitting: Family Medicine

## 2012-01-16 IMAGING — US US OB UMBILICAL ART DOPPLER
1 series · 8 of 8 positions shown · non-contrast
Comparison: none

[Series 1: us ob umbilical art doppler · 0.24mm/px · 8 of 8 slices shown]
[im 1/8]
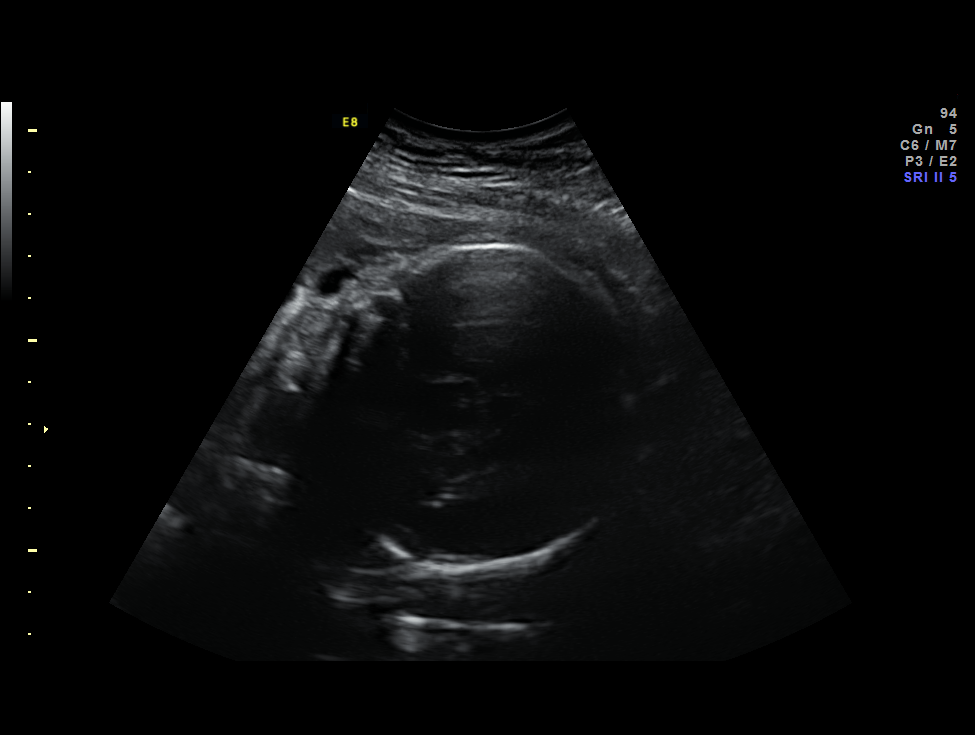
[im 2/8]
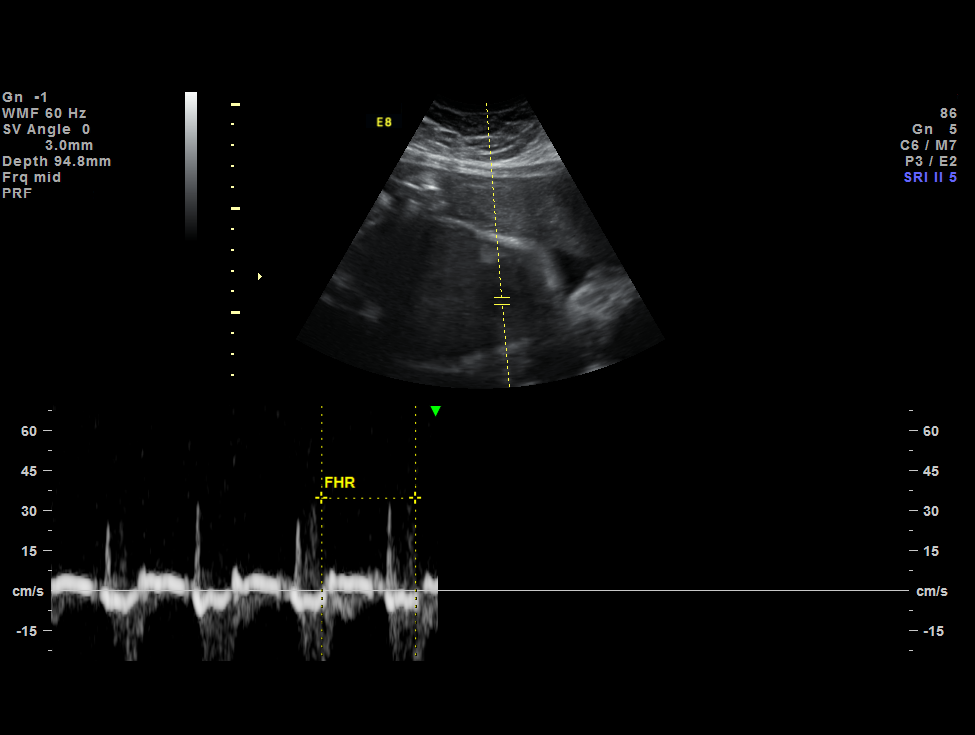
[im 3/8]
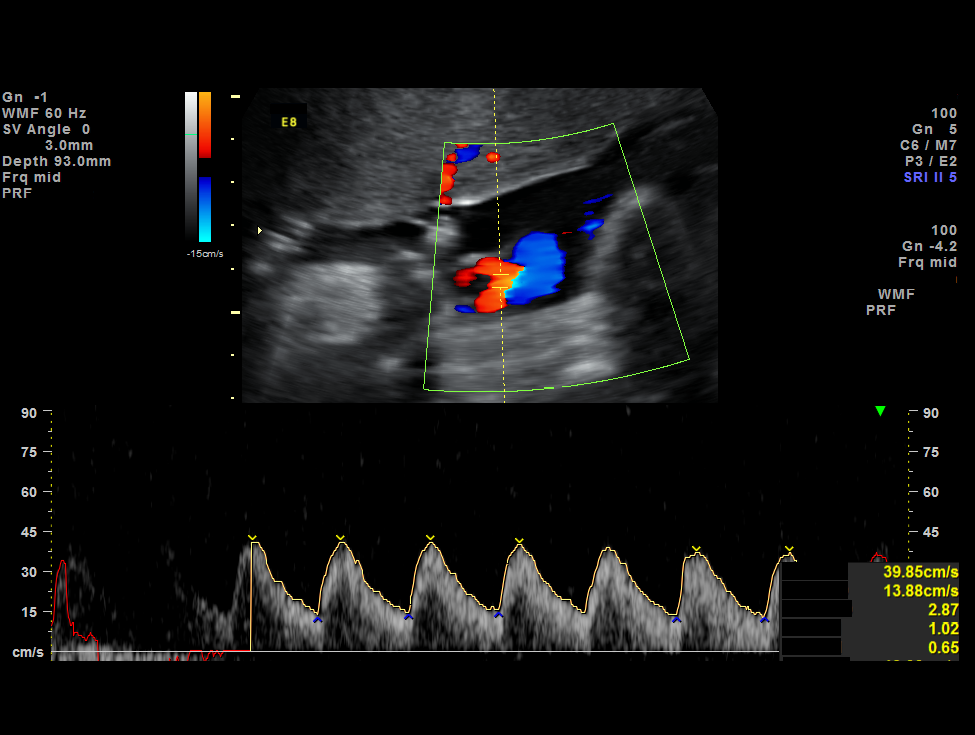
[im 4/8]
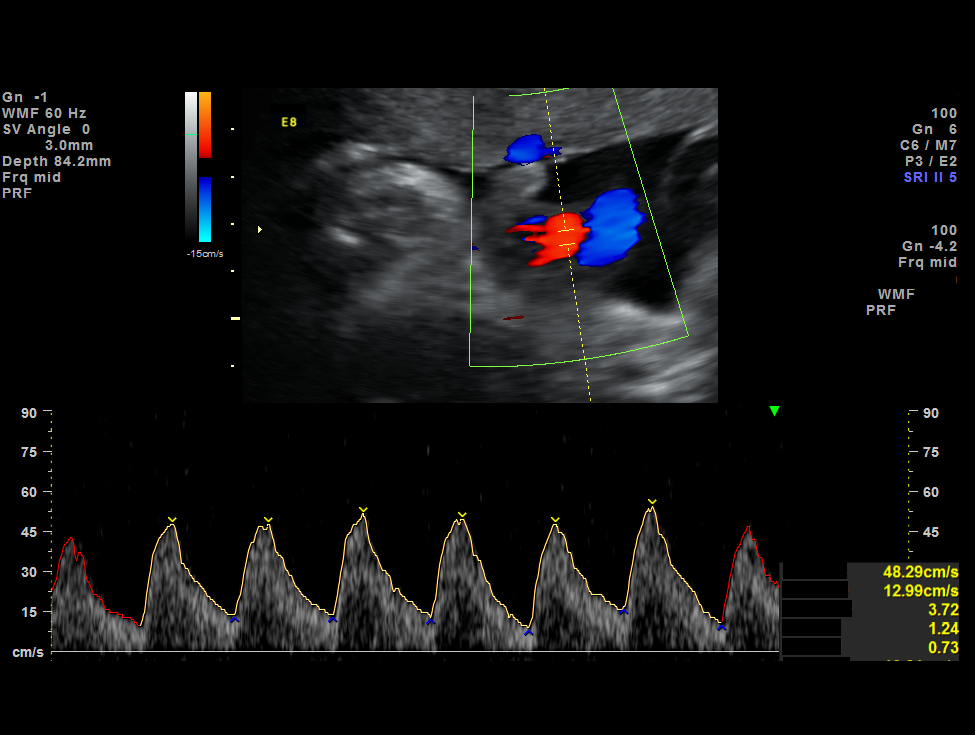
[im 5/8]
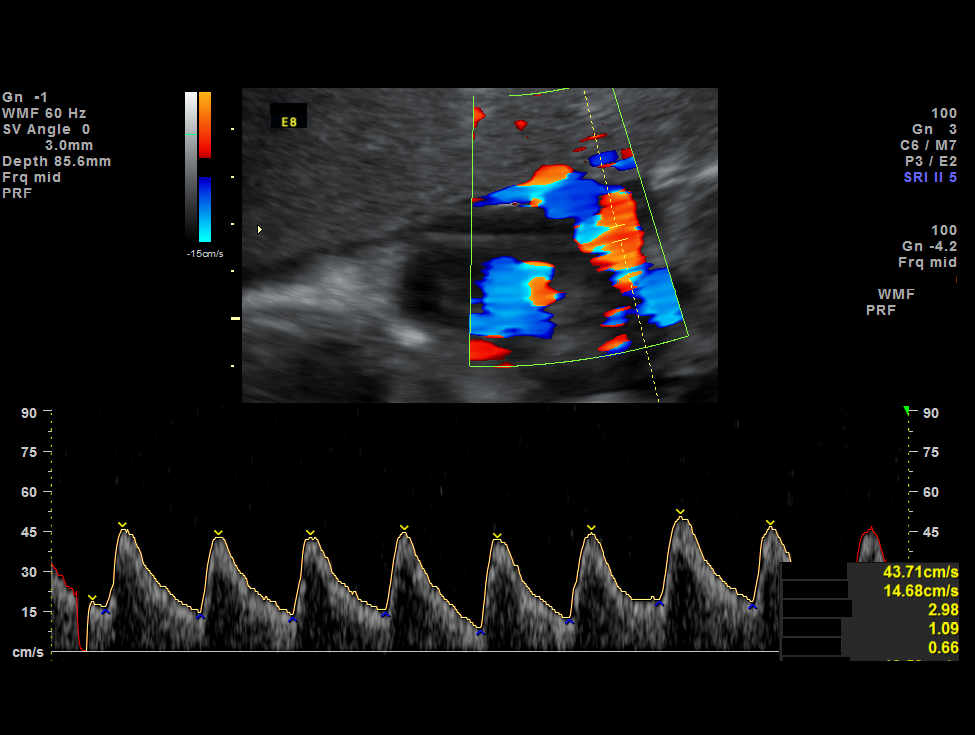
[im 6/8]
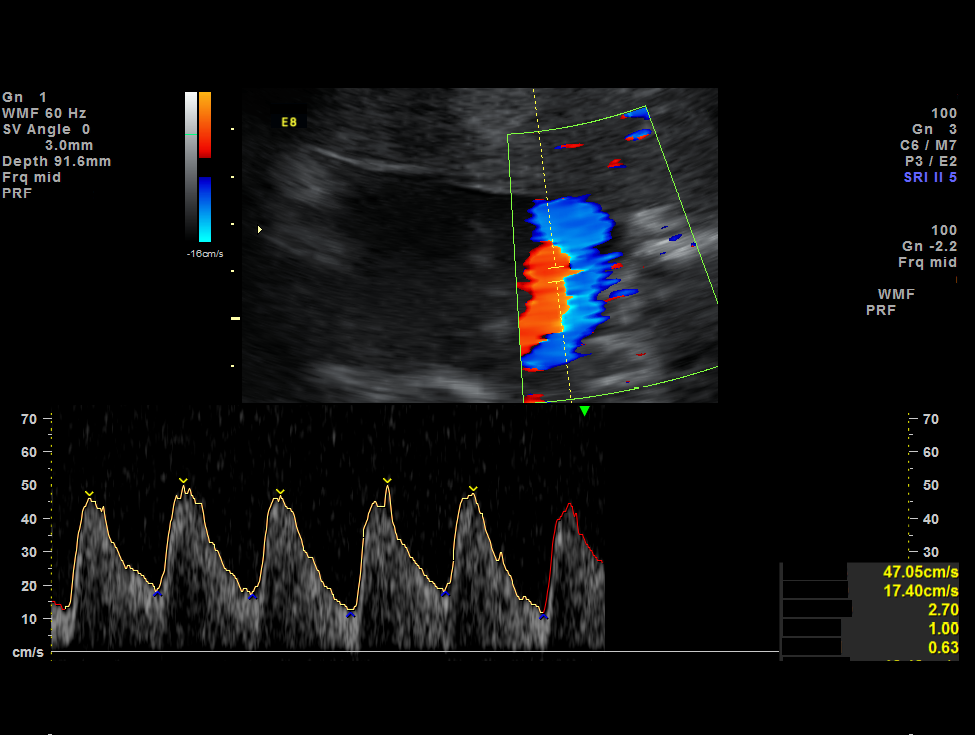
[im 7/8]
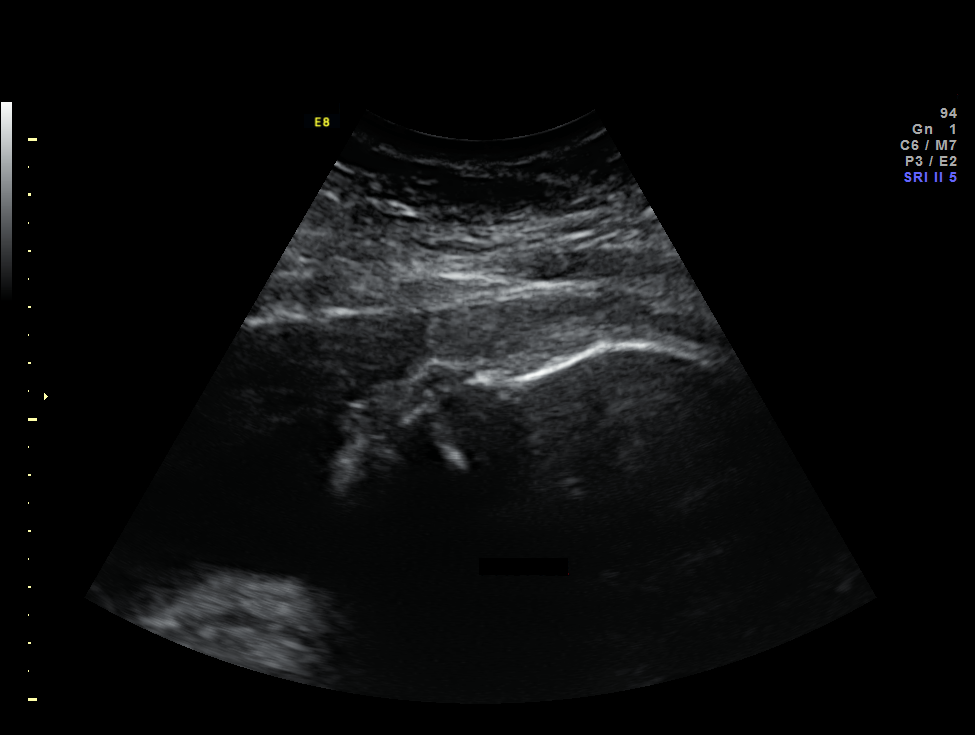
[im 8/8]
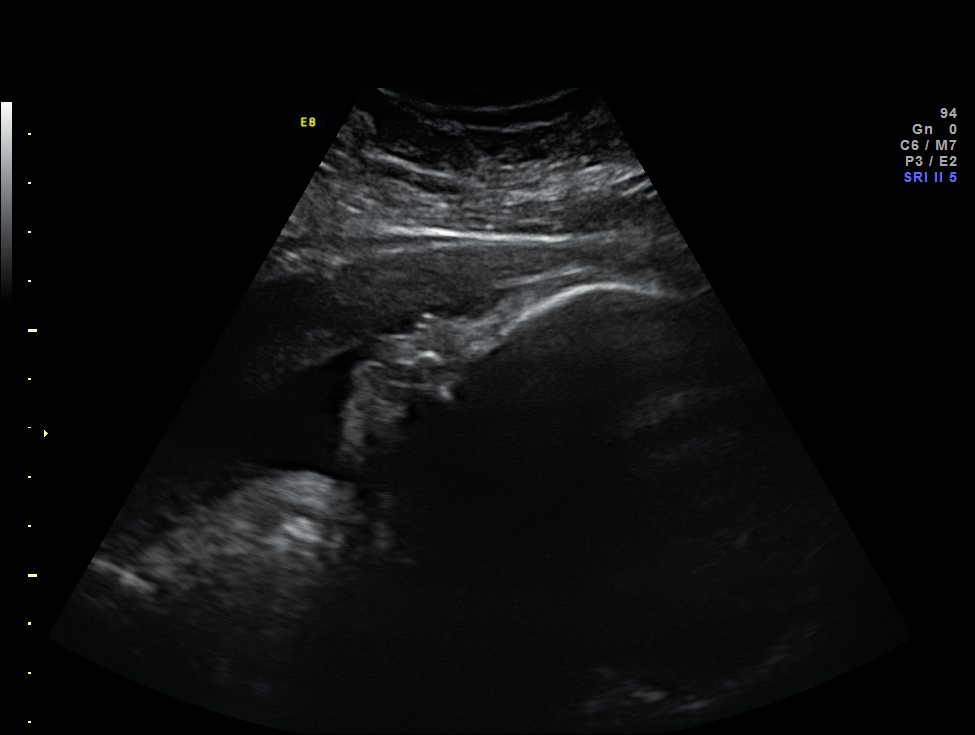

[8 of 8 positions shown; findings below may reference images not displayed]

Canned report from images found in remote index.

Refer to host system for actual result text.

## 2012-02-10 ENCOUNTER — Ambulatory Visit: Payer: Self-pay | Admitting: Family Medicine

## 2012-03-23 ENCOUNTER — Ambulatory Visit: Payer: Self-pay | Admitting: Family Medicine

## 2012-03-25 ENCOUNTER — Ambulatory Visit: Payer: Self-pay | Admitting: Family Medicine

## 2012-05-31 ENCOUNTER — Ambulatory Visit: Payer: Self-pay | Admitting: Family Medicine

## 2012-06-24 ENCOUNTER — Ambulatory Visit: Payer: Self-pay | Admitting: Family Medicine

## 2012-07-01 ENCOUNTER — Ambulatory Visit (INDEPENDENT_AMBULATORY_CARE_PROVIDER_SITE_OTHER): Payer: Medicaid Other | Admitting: Family Medicine

## 2012-07-01 ENCOUNTER — Encounter: Payer: Self-pay | Admitting: Family Medicine

## 2012-07-01 ENCOUNTER — Other Ambulatory Visit (HOSPITAL_COMMUNITY)
Admission: RE | Admit: 2012-07-01 | Discharge: 2012-07-01 | Disposition: A | Discharge status: Home / Self Care | Payer: Medicaid Other | Source: Ambulatory Visit | Attending: Family Medicine | Admitting: Family Medicine

## 2012-07-01 VITALS — BP 123/85 | HR 89 | Temp 98.0°F | Ht 61.0 in | Wt 200.0 lb

## 2012-07-01 DIAGNOSIS — Z975 Presence of (intrauterine) contraceptive device: Secondary | ICD-10-CM | POA: Insufficient documentation

## 2012-07-01 DIAGNOSIS — N898 Other specified noninflammatory disorders of vagina: Secondary | ICD-10-CM

## 2012-07-01 DIAGNOSIS — N730 Acute parametritis and pelvic cellulitis: Secondary | ICD-10-CM

## 2012-07-01 DIAGNOSIS — E119 Type 2 diabetes mellitus without complications: Secondary | ICD-10-CM

## 2012-07-01 DIAGNOSIS — I10 Essential (primary) hypertension: Secondary | ICD-10-CM

## 2012-07-01 DIAGNOSIS — Z113 Encounter for screening for infections with a predominantly sexual mode of transmission: Secondary | ICD-10-CM | POA: Insufficient documentation

## 2012-07-01 LAB — POCT WET PREP (WET MOUNT)

## 2012-07-01 LAB — COMPREHENSIVE METABOLIC PANEL
Albumin: 4.3 g/dL (ref 3.5–5.2)
Alkaline Phosphatase: 118 U/L — ABNORMAL HIGH (ref 39–117)
BUN: 10 mg/dL (ref 6–23)
CO2: 24 mEq/L (ref 19–32)
Glucose, Bld: 322 mg/dL — ABNORMAL HIGH (ref 70–99)
Potassium: 4.2 mEq/L (ref 3.5–5.3)
Total Protein: 7.5 g/dL (ref 6.0–8.3)

## 2012-07-01 LAB — CBC
MCV: 79 fL (ref 78.0–100.0)
Platelets: 390 10*3/uL (ref 150–400)
RBC: 4.63 MIL/uL (ref 3.87–5.11)
RDW: 16.7 % — ABNORMAL HIGH (ref 11.5–15.5)
WBC: 7.8 10*3/uL (ref 4.0–10.5)

## 2012-07-01 LAB — LDL CHOLESTEROL, DIRECT: Direct LDL: 98 mg/dL

## 2012-07-01 MED ORDER — METFORMIN HCL 1000 MG PO TABS: 1000.0000 mg | ORAL_TABLET | Freq: Two times a day (BID) | ORAL | Status: DC

## 2012-07-01 MED ORDER — CEFTRIAXONE SODIUM 1 G IJ SOLR
250.0000 mg | Freq: Once | INTRAMUSCULAR | Status: AC
  Administered 2012-07-01: 250 mg via INTRAMUSCULAR

## 2012-07-01 MED ORDER — DOXYCYCLINE HYCLATE 100 MG PO TABS: 100.0000 mg | ORAL_TABLET | Freq: Two times a day (BID) | ORAL | Status: DC

## 2012-07-01 MED ORDER — GLYBURIDE 5 MG PO TABS: 5.0000 mg | ORAL_TABLET | Freq: Two times a day (BID) | ORAL | Status: DC

## 2012-07-01 NOTE — Patient Instructions (Addendum)
Your diabetes is uncontrolled.  Goal A1c is less than 7.0. (it is 11.7 today). Try to take metformin with food or a snack. You should add glyburide with your two heaviest meals of the day. Keep working on exercise daily. Make appointment in 2 months for check up. If your bleeding doesn't get better, then make appointment for check up.

## 2012-07-01 NOTE — Assessment & Plan Note (Signed)
Patient with cervical tenderness, pelvic pain and heavy menstrual bleeding with recent unprotected intercourse. Seems high likelihood of PID without systemic signs. Will start outpatient treatment course with rocephin IM and doxycycline x 14 days. Has implanon currently (placed 2012). Will check CBC, electrolytes, TSH, cervical cultures, wet prep. Advised pt to avoid sexual intercourse pending antibiotic treatment and test results and always use condoms for STD protection.

## 2012-07-01 NOTE — Assessment & Plan Note (Signed)
Controlled without meds currently.

## 2012-07-01 NOTE — Progress Notes (Signed)
  Subjective:    Patient ID: [name] [name], female    DOB: 1986-04-02, 27 y.o.   MRN: [id number]  HPI  1. DM2. Patient acknowledges she has out of control diabetes, coming after she moved to [place] last year just previous to the birth of her child. Her A1c has ranged from 6.3-10 in the past three years. She was on insulin prescribed by her OB in [place] for few months during the end of her pregnancy, though previously she was controlled with oral glycemics. She returned from [place] but hasn't been seeing a doctor for glucose. She ran out of metformin few days ago, and states she has some mild nausea when she takes 1000 mg pill. She is not taking glyburide.  Her CBG last evening was 145. Denies any hypoglycemic events. She endorses polyruia, polydipsia, polyphagia.  She has restarted walking recently for exercise, and has lost few lbs.   2. Pelvic pain, abnormal menstrual bleeding. Reports that 2 weeks ago she had episode of heavy menstrual bleeding with cramping and continues to have constant pelvic cramping. She has implanon inserted in 2012. She has had unprotected intercourse in past 2-4 weeks. Prior to this episode, her last normal menstrual cycle was 2 months ago. She denies fever, chills, nausea, emesis, dysuria, back pain, rash, vaginal lesions, diarrhea.   Review of Systems See HPI otherwise negative.  reports that she has never smoked. She has never used smokeless tobacco.     Objective:   Physical Exam  Vitals reviewed. Constitutional: She is oriented to person, place, and time. She appears well-developed and well-nourished. No distress.  HENT:  Head: Normocephalic and atraumatic.  Mouth/Throat: Oropharynx is clear and moist.  Eyes: EOM are normal. Pupils are equal, round, and reactive to light.  Cardiovascular: Normal rate, regular rhythm and normal heart sounds.   Pulmonary/Chest: Effort normal and breath sounds normal. No respiratory distress. She has no wheezes. She has no rales.   Abdominal: Soft. Bowel sounds are normal. She exhibits no distension. There is no tenderness. There is no rebound and no guarding.  Genitourinary: Vaginal discharge found.  Mucoid discharge at cervix. Mucosa appears wnl. There is tenderness with palpation of cervix. No masses palpated.  External genitalia wnl.  Musculoskeletal: She exhibits no edema and no tenderness.  Neurological: She is alert and oriented to person, place, and time.  Skin: No rash noted. She is not diaphoretic.  Psychiatric: She has a normal mood and affect.          Assessment & Plan:

## 2012-07-01 NOTE — Assessment & Plan Note (Addendum)
Uncontrolled currently. Patient has varying degrees of control with oral glycemic agents in the past. Since she is just returning to regular primary care and has poor follow up, will start with adding back her metformin and glyburide. Treatment of her probable PID will possibly improve the hyperglycemia also. If she remains uncontrolled, will likely need to return to lantus insulin (though will need to confirm she still has medicaid-or if only during pregnancy). Foot exam wnl.   Advised to f/u in 1-2 months for follow up.

## 2012-07-05 ENCOUNTER — Telehealth: Payer: Self-pay | Admitting: Family Medicine

## 2012-07-05 NOTE — Telephone Encounter (Signed)
Pt picked up antibiotic and was scheduled for march 5th at 4:00. Laine Fonner, [name]

## 2012-07-05 NOTE — Telephone Encounter (Signed)
Please call patient and make sure she picked up her antibiotics. Her infection testing was negative. Schedule her a follow up in 1-2 weeks, please.

## 2012-07-14 ENCOUNTER — Ambulatory Visit: Payer: Self-pay | Admitting: Family Medicine

## 2012-07-16 ENCOUNTER — Ambulatory Visit: Payer: Self-pay | Admitting: Family Medicine

## 2012-08-20 ENCOUNTER — Encounter: Payer: Self-pay | Admitting: Family Medicine

## 2012-08-20 ENCOUNTER — Ambulatory Visit (INDEPENDENT_AMBULATORY_CARE_PROVIDER_SITE_OTHER): Payer: Medicaid Other | Admitting: Family Medicine

## 2012-08-20 VITALS — BP 136/77 | HR 92 | Temp 99.2°F | Ht 61.0 in | Wt 201.3 lb

## 2012-08-20 DIAGNOSIS — E119 Type 2 diabetes mellitus without complications: Secondary | ICD-10-CM

## 2012-08-20 MED ORDER — GLYBURIDE 5 MG PO TABS: ORAL_TABLET | ORAL | Status: DC

## 2012-08-20 MED ORDER — ACCU-CHEK NANO SMARTVIEW W/DEVICE KIT: 1.0000 | PACK | Freq: Every day | Status: DC

## 2012-08-20 MED ORDER — GLUCOSE BLOOD VI STRP: ORAL_STRIP | Status: DC

## 2012-08-20 NOTE — Progress Notes (Signed)
  Subjective:    Patient ID: [name] [name], female    DOB: 01-12-1986, 27 y.o.   MRN: [id number]  HPI  1. F/u DM2. Patient has started her glyburide as prescribed. She continues with metformin. She reports fasting blood sugars around 120 typically, with the highest being 180. She infrequently checks at night but does report one value being over 200. She states that she does know what to do for diabetes, Nutrition-wise and does not wish to seek nutrition counseling currently. She is nearly out of her testing strips for the meter. She denies any hypoglycemic episodes, weight loss, polyuria, fatigue.  2. F/u pelvic pain. This has resolved after empiric treatment for PID. Her cervical cultures were negative.  Review of Systems See HPI otherwise negative.  reports that she has never smoked. She has never used smokeless tobacco.     Objective:   Physical Exam  Vitals reviewed. Constitutional: She is oriented to person, place, and time. She appears well-developed and well-nourished. No distress.  HENT:  Head: Normocephalic and atraumatic.  Mouth/Throat: Oropharynx is clear and moist.  Neurological: She is alert and oriented to person, place, and time.  Skin: She is not diaphoretic.  Psychiatric: She has a normal mood and affect.       Assessment & Plan:

## 2012-08-20 NOTE — Assessment & Plan Note (Signed)
Reports improved hyperglycemia. Will increase glyburide to 10 mg q. lunch and 5 mg with dinner. Will send Accu-Chek meter for Medicaid coverage. Patient feels confident she can achieve improved control and we discussed some dietary changes such as avoiding juice and soda. Encourage her to check blood sugar every morning and as needed for low symptoms. Followup in 4-6 weeks. She will need an ophthalmology referral and microalbumin.

## 2012-08-20 NOTE — Patient Instructions (Addendum)
Increase your glyburide to two tabs with lunch, one tab with dinner. Notify MD if having any low blood sugar less than 70, or higher than 300. Make an appointment in 4-6 weeks.   Diabetes and Exercise Regular exercise is important and can help:   Control blood glucose (sugar).  Decrease blood pressure.    Control blood lipids (cholesterol, triglycerides).  Improve overall health. BENEFITS FROM EXERCISE  Improved fitness.  Improved flexibility.  Improved endurance.  Increased bone density.  Weight control.  Increased muscle strength.  Decreased body fat.  Improvement of the body's use of insulin, a hormone.  Increased insulin sensitivity.  Reduction of insulin needs.  Reduced stress and tension.  Helps you feel better. People with diabetes who add exercise to their lifestyle gain additional benefits, including:  Weight loss.  Reduced appetite.  Improvement of the body's use of blood glucose.  Decreased risk factors for heart disease:  Lowering of cholesterol and triglycerides.  Raising the level of good cholesterol (high-density lipoproteins, HDL).  Lowering blood sugar.  Decreased blood pressure. TYPE 1 DIABETES AND EXERCISE  Exercise will usually lower your blood glucose.  If blood glucose is greater than 240 mg/dl, check urine ketones. If ketones are present, do not exercise.  Location of the insulin injection sites may need to be adjusted with exercise. Avoid injecting insulin into areas of the body that will be exercised. For example, avoid injecting insulin into:  The arms when playing tennis.  The legs when jogging. For more information, discuss this with your caregiver.  Keep a record of:  Food intake.  Type and amount of exercise.  Expected peak times of insulin action.  Blood glucose levels. Do this before, during, and after exercise. Review your records with your caregiver. This will help you to develop guidelines for adjusting  food intake and insulin amounts.  TYPE 2 DIABETES AND EXERCISE  Regular physical activity can help control blood glucose.  Exercise is important because it may:  Increase the body's sensitivity to insulin.  Improve blood glucose control.  Exercise reduces the risk of heart disease. It decreases serum cholesterol and triglycerides. It also lowers blood pressure.  Those who take insulin or oral hypoglycemic agents should watch for signs of hypoglycemia. These signs include dizziness, shaking, sweating, chills, and confusion.  Body water is lost during exercise. It must be replaced. This will help to avoid loss of body fluids (dehydration) or heat stroke. Be sure to talk to your caregiver before starting an exercise program to make sure it is safe for you. Remember, any activity is better than none.  Document Released: 07/19/2003 Document Revised: 07/21/2011 Document Reviewed: 11/02/2008 [facility] Patient Information 2013 [place], [place].

## 2012-09-10 ENCOUNTER — Ambulatory Visit: Payer: Medicaid Other | Admitting: Family Medicine

## 2012-10-23 ENCOUNTER — Emergency Department (HOSPITAL_COMMUNITY): Payer: Medicaid Other

## 2012-10-23 ENCOUNTER — Emergency Department (HOSPITAL_COMMUNITY)
Admission: EM | Admit: 2012-10-23 | Discharge: 2012-10-24 | Disposition: A | Discharge status: Home / Self Care | Payer: Medicaid Other | Attending: Emergency Medicine | Admitting: Emergency Medicine

## 2012-10-23 ENCOUNTER — Encounter (HOSPITAL_COMMUNITY): Payer: Self-pay

## 2012-10-23 DIAGNOSIS — M549 Dorsalgia, unspecified: Secondary | ICD-10-CM | POA: Insufficient documentation

## 2012-10-23 DIAGNOSIS — Z8742 Personal history of other diseases of the female genital tract: Secondary | ICD-10-CM | POA: Insufficient documentation

## 2012-10-23 DIAGNOSIS — Z3202 Encounter for pregnancy test, result negative: Secondary | ICD-10-CM | POA: Insufficient documentation

## 2012-10-23 DIAGNOSIS — Z79899 Other long term (current) drug therapy: Secondary | ICD-10-CM | POA: Insufficient documentation

## 2012-10-23 DIAGNOSIS — I10 Essential (primary) hypertension: Secondary | ICD-10-CM | POA: Insufficient documentation

## 2012-10-23 DIAGNOSIS — R21 Rash and other nonspecific skin eruption: Secondary | ICD-10-CM | POA: Insufficient documentation

## 2012-10-23 DIAGNOSIS — Z8619 Personal history of other infectious and parasitic diseases: Secondary | ICD-10-CM | POA: Insufficient documentation

## 2012-10-23 DIAGNOSIS — E119 Type 2 diabetes mellitus without complications: Secondary | ICD-10-CM | POA: Insufficient documentation

## 2012-10-23 DIAGNOSIS — E669 Obesity, unspecified: Secondary | ICD-10-CM | POA: Insufficient documentation

## 2012-10-23 DIAGNOSIS — B9789 Other viral agents as the cause of diseases classified elsewhere: Secondary | ICD-10-CM | POA: Insufficient documentation

## 2012-10-23 DIAGNOSIS — B349 Viral infection, unspecified: Secondary | ICD-10-CM

## 2012-10-23 LAB — CBC WITH DIFFERENTIAL/PLATELET
Basophils Absolute: 0 10*3/uL (ref 0.0–0.1)
Basophils Relative: 0 % (ref 0–1)
HCT: 36.8 % (ref 36.0–46.0)
Hemoglobin: 11.6 g/dL — ABNORMAL LOW (ref 12.0–15.0)
MCH: 25.1 pg — ABNORMAL LOW (ref 26.0–34.0)
MCV: 79.5 fL (ref 78.0–100.0)
Monocytes Absolute: 0.5 10*3/uL (ref 0.1–1.0)
Neutro Abs: 6 10*3/uL (ref 1.7–7.7)
Platelets: 314 10*3/uL (ref 150–400)
RBC: 4.63 MIL/uL (ref 3.87–5.11)
WBC: 8 10*3/uL (ref 4.0–10.5)

## 2012-10-23 LAB — URINALYSIS, ROUTINE W REFLEX MICROSCOPIC
Glucose, UA: NEGATIVE mg/dL
Hgb urine dipstick: NEGATIVE
Protein, ur: NEGATIVE mg/dL
pH: 8 (ref 5.0–8.0)

## 2012-10-23 LAB — COMPREHENSIVE METABOLIC PANEL
AST: 13 U/L (ref 0–37)
Albumin: 3.6 g/dL (ref 3.5–5.2)
Chloride: 100 mEq/L (ref 96–112)
Creatinine, Ser: 0.59 mg/dL (ref 0.50–1.10)
Potassium: 3.8 mEq/L (ref 3.5–5.1)
Total Bilirubin: 0.4 mg/dL (ref 0.3–1.2)

## 2012-10-23 LAB — URINE MICROSCOPIC-ADD ON

## 2012-10-23 MED ORDER — ACETAMINOPHEN 325 MG PO TABS
650.0000 mg | ORAL_TABLET | Freq: Four times a day (QID) | ORAL | Status: DC | PRN
  Administered 2012-10-23: 650 mg via ORAL
  Filled 2012-10-23: qty 2

## 2012-10-23 MED ORDER — IBUPROFEN 600 MG PO TABS: 600.0000 mg | ORAL_TABLET | Freq: Four times a day (QID) | ORAL | Status: DC | PRN

## 2012-10-23 NOTE — ED Notes (Signed)
Pt presents with c/o generalized body aches and chest tightness that began around 4:30 today. Pt has obvious drainage from her eyes. Pt also c/o nasal drainage.

## 2012-10-23 NOTE — ED Notes (Signed)
Bed:WA11<BR> Expected date:<BR> Expected time:<BR> Means of arrival:<BR> Comments:<BR>

## 2012-10-24 NOTE — ED Provider Notes (Addendum)
History     CSN: [id number]  Arrival date & time 10/23/12  2148   First MD Initiated Contact with Patient 10/23/12 2232      Chief Complaint  Patient presents with  . Generalized Body Aches    (Consider location/radiation/quality/duration/timing/severity/associated sxs/prior treatment) HPI Comments: Pt comes in with cc of generalized body aches, neck rash. Pt has hx of DM. She reports having generalized body aches, back pain - right lateral starting this afternoon. Pt has no uri like sx, no n/v/f/c. Pt also has a little lesion / nodule to the neck - she thinks an insect bit her. The lesion is itchy, and there is mild pain with palpation.  The history is provided by the patient.    Past Medical History  Diagnosis Date  . Hypertension   . Herpes simplex without mention of complication   . Obesity   . Diabetes mellitus     type II  . Irregular menses   . Abnormal Pap smear   . GBS (group B streptococcus) UTI complicating pregnancy     Past Surgical History  Procedure Laterality Date  . Cesarean section  01/30/2011    Procedure: CESAREAN SECTION;  Surgeon: Tilda Burrow, MD;  Location: [facility];  Service: Gynecology;  Laterality: N/A;    Family History  Problem Relation Age of Onset  . Diabetes Paternal Grandmother   . Hypertension Paternal Grandmother   . Diabetes Maternal Grandmother   . Diabetes Father   . Cancer Paternal Uncle     History  Substance Use Topics  . Smoking status: Never Smoker   . Smokeless tobacco: Never Used  . Alcohol Use: No    OB History   Grav Para Term Preterm Abortions TAB SAB Ect Mult Living   2 1 1  1  1   1       Review of Systems  Constitutional: Negative for activity change and fatigue.  HENT: Negative for neck pain.   Respiratory: Negative for shortness of breath.   Cardiovascular: Negative for chest pain.  Gastrointestinal: Negative for nausea, vomiting and abdominal pain.  Genitourinary: Negative for dysuria.   Musculoskeletal: Positive for myalgias and back pain. Negative for arthralgias.  Skin: Positive for rash.  Neurological: Negative for headaches.    Allergies  Review of patient's allergies indicates no known allergies.  Home Medications   Current Outpatient Rx  Name  Route  Sig  Dispense  Refill  . aspirin-sod bicarb-citric acid (ALKA-SELTZER) 325 MG TBEF   Oral   Take 325 mg by mouth every 6 (six) hours as needed (for cold symptoms).         Marland Kitchen etonogestrel (IMPLANON) 68 MG IMPL implant   Subcutaneous   Inject 1 each into the skin continuous. Done in Nov 2012.         . glyBURIDE (DIABETA) 5 MG tablet   Oral   Take 5-10 mg by mouth 2 (two) times daily. Take 10 mg at lunch and take 5mg  at dinner         . metFORMIN (GLUCOPHAGE) 1000 MG tablet   Oral   Take 1 tablet (1,000 mg total) by mouth 2 (two) times daily.   60 tablet   11   . ibuprofen (ADVIL,MOTRIN) 600 MG tablet   Oral   Take 1 tablet (600 mg total) by mouth every 6 (six) hours as needed for pain.   30 tablet   0     BP 134/83  Pulse 112  Temp(Src) 102.7 F (39.3 C) (Oral)  Resp 18  SpO2 100%  Breastfeeding? No  Physical Exam  Nursing note and vitals reviewed. Constitutional: She is oriented to person, place, and time. She appears well-developed and well-nourished.  HENT:  Head: Normocephalic and atraumatic.  Eyes: EOM are normal. Pupils are equal, round, and reactive to light.  Neck: Neck supple.  Cardiovascular: Normal rate, regular rhythm and normal heart sounds.   No murmur heard. Pulmonary/Chest: Effort normal. No respiratory distress.  Abdominal: Soft. She exhibits no distension. There is no tenderness. There is no rebound and no guarding.  Musculoskeletal:  Pt has tenderness over the lumbar region No step offs, no erythema. Pt has 2+ patellar reflex bilaterally. Able to discriminate between sharp and dull. Able to ambulate  Pt has right lateral pain-  But not reproducible with  palpation.   Neurological: She is alert and oriented to person, place, and time.  Skin: Skin is warm and dry.    ED Course  Procedures (including critical care time)  Labs Reviewed  CBC WITH DIFFERENTIAL - Abnormal; Notable for the following:    Hemoglobin 11.6 (*)    MCH 25.1 (*)    All other components within normal limits  COMPREHENSIVE METABOLIC PANEL - Abnormal; Notable for the following:    Glucose, Bld 229 (*)    Alkaline Phosphatase 122 (*)    All other components within normal limits  URINALYSIS, ROUTINE W REFLEX MICROSCOPIC - Abnormal; Notable for the following:    APPearance CLOUDY (*)    Leukocytes, UA MODERATE (*)    All other components within normal limits  URINE MICROSCOPIC-ADD ON - Abnormal; Notable for the following:    Squamous Epithelial / LPF MANY (*)    All other components within normal limits  URINE CULTURE  POCT PREGNANCY, URINE  POCT LACTIC ACID (LACTATE)   Dg Chest 2 View  10/23/2012   *RADIOLOGY REPORT*  Clinical Data: Chest pain and fever  CHEST - 2 VIEW  Comparison: June 06, 2009  Findings: Lungs clear.  Heart size and pulmonary vascularity are normal.  No adenopathy.  No bone lesions.  No pneumothorax.  IMPRESSION: No abnormality noted.   Original Report Authenticated By: Bretta Bang, M.D.     1. Viral syndrome       MDM  Pt comes in with cc of generalized weakness, pain. No headaches. Vitals signs are clear. Hx not suggestive of uri or any true source of infection. Med review shows no statin. CBG is WNL, no anion gap. Pt also has back pain - not lumbar, it is close to the flank region. No uti like sx, UA will be evaluated. The pain is mild, and intermittent, no hx of renal stones, no indication for CT renal stone protocol.  We will check UA, get Chem 7. If results are WNL - will discharge.  Derwood Kaplan, MD 10/24/12 0865  Derwood Kaplan, MD 10/24/12 [id number]

## 2012-10-25 LAB — URINE CULTURE: Colony Count: 70000

## 2012-11-12 ENCOUNTER — Encounter (HOSPITAL_COMMUNITY): Payer: Self-pay

## 2012-11-12 ENCOUNTER — Emergency Department (HOSPITAL_COMMUNITY)
Admission: EM | Admit: 2012-11-12 | Discharge: 2012-11-12 | Disposition: A | Discharge status: Home / Self Care | Payer: Medicaid Other | Attending: Emergency Medicine | Admitting: Emergency Medicine

## 2012-11-12 DIAGNOSIS — Z8742 Personal history of other diseases of the female genital tract: Secondary | ICD-10-CM | POA: Insufficient documentation

## 2012-11-12 DIAGNOSIS — E669 Obesity, unspecified: Secondary | ICD-10-CM | POA: Insufficient documentation

## 2012-11-12 DIAGNOSIS — I10 Essential (primary) hypertension: Secondary | ICD-10-CM | POA: Insufficient documentation

## 2012-11-12 DIAGNOSIS — B009 Herpesviral infection, unspecified: Secondary | ICD-10-CM | POA: Insufficient documentation

## 2012-11-12 DIAGNOSIS — B373 Candidiasis of vulva and vagina: Secondary | ICD-10-CM | POA: Insufficient documentation

## 2012-11-12 DIAGNOSIS — Z3202 Encounter for pregnancy test, result negative: Secondary | ICD-10-CM | POA: Insufficient documentation

## 2012-11-12 DIAGNOSIS — Z79899 Other long term (current) drug therapy: Secondary | ICD-10-CM | POA: Insufficient documentation

## 2012-11-12 DIAGNOSIS — B3731 Acute candidiasis of vulva and vagina: Secondary | ICD-10-CM | POA: Insufficient documentation

## 2012-11-12 DIAGNOSIS — Z8619 Personal history of other infectious and parasitic diseases: Secondary | ICD-10-CM | POA: Insufficient documentation

## 2012-11-12 DIAGNOSIS — E119 Type 2 diabetes mellitus without complications: Secondary | ICD-10-CM | POA: Insufficient documentation

## 2012-11-12 DIAGNOSIS — M722 Plantar fascial fibromatosis: Secondary | ICD-10-CM | POA: Insufficient documentation

## 2012-11-12 LAB — URINALYSIS, ROUTINE W REFLEX MICROSCOPIC
Bilirubin Urine: NEGATIVE
Hgb urine dipstick: NEGATIVE
Nitrite: NEGATIVE
Specific Gravity, Urine: 1.039 — ABNORMAL HIGH (ref 1.005–1.030)
pH: 7.5 (ref 5.0–8.0)

## 2012-11-12 LAB — WET PREP, GENITAL: Trich, Wet Prep: NONE SEEN

## 2012-11-12 MED ORDER — FLUCONAZOLE 150 MG PO TABS
150.0000 mg | ORAL_TABLET | Freq: Once | ORAL | Status: AC
  Administered 2012-11-12: 150 mg via ORAL
  Filled 2012-11-12: qty 1

## 2012-11-12 MED ORDER — IBUPROFEN 800 MG PO TABS: 800.0000 mg | ORAL_TABLET | Freq: Three times a day (TID) | ORAL | Status: DC

## 2012-11-12 MED ORDER — FLUCONAZOLE 200 MG PO TABS: 200.0000 mg | ORAL_TABLET | Freq: Every day | ORAL | Status: AC

## 2012-11-12 NOTE — ED Provider Notes (Signed)
History    CSN: [id number] Arrival date & time 11/12/12  1007  First MD Initiated Contact with Patient 11/12/12 1040     Chief Complaint  Patient presents with  . Foot Pain  . Vaginal Discharge   (Consider location/radiation/quality/duration/timing/severity/associated sxs/prior Treatment) HPI  27 year old obese female with history of diabetes, hypertension, and presents complaining of vaginal discharge, and also right foot pain. Patient states for the past 4 days she has notice white vaginal discharge with intense itch in the vagina. Symptom has been persistent, not improved. She has had symptoms like this in the past, and attributed to her diabetes.  She did have prior history of STD but states she has not been sexually active for several months. Denies any changes in hygiene, soap, detergent.  Denies urinary complaints, hematuria, or rash.  No complaint of fever, chills, back pain, abdominal pain.   No specific treatment tried.  Patient also complaining of right foot pain. States pain is achy, worse with ambulation and weightbearing however she is able to ambulate.  Denies any specific trauma, but think she may have dropped a book on it a few days ago.  No hx of gout.  No rash.  No changes in shoe or activity.     Past Medical History  Diagnosis Date  . Hypertension   . Herpes simplex without mention of complication   . Obesity   . Diabetes mellitus     type II  . Irregular menses   . Abnormal Pap smear   . GBS (group B streptococcus) UTI complicating pregnancy    Past Surgical History  Procedure Laterality Date  . Cesarean section  01/30/2011    Procedure: CESAREAN SECTION;  Surgeon: Tilda Burrow, MD;  Location: [facility];  Service: Gynecology;  Laterality: N/A;   Family History  Problem Relation Age of Onset  . Diabetes Paternal Grandmother   . Hypertension Paternal Grandmother   . Diabetes Maternal Grandmother   . Diabetes Father   . Cancer Paternal Uncle    History   Substance Use Topics  . Smoking status: Never Smoker   . Smokeless tobacco: Never Used  . Alcohol Use: No   OB History   Grav Para Term Preterm Abortions TAB SAB Ect Mult Living   2 1 1  1  1   1      Review of Systems  All other systems reviewed and are negative.    Allergies  Review of patient's allergies indicates no known allergies.  Home Medications   Current Outpatient Rx  Name  Route  Sig  Dispense  Refill  . etonogestrel (IMPLANON) 68 MG IMPL implant   Subcutaneous   Inject 1 each into the skin continuous. Done in Nov 2012.         . ibuprofen (ADVIL,MOTRIN) 600 MG tablet   Oral   Take 1 tablet (600 mg total) by mouth every 6 (six) hours as needed for pain.   30 tablet   0   . metFORMIN (GLUCOPHAGE) 1000 MG tablet   Oral   Take 1 tablet (1,000 mg total) by mouth 2 (two) times daily.   60 tablet   11    BP 128/81  Pulse 85  Temp(Src) 98.9 F (37.2 C) (Oral)  Resp 16  Ht 5\' 1"  (1.549 m)  Wt 199 lb 2 oz (90.323 kg)  BMI 37.64 kg/m2  SpO2 99% Physical Exam  Nursing note and vitals reviewed. Constitutional: She appears well-developed and well-nourished. No distress.  Moderately obese  HENT:  Head: Normocephalic and atraumatic.  Eyes: Conjunctivae are normal.  Neck: Normal range of motion. Neck supple.  Cardiovascular: Normal rate and regular rhythm.   Pulmonary/Chest: Effort normal and breath sounds normal. She exhibits no tenderness.  Abdominal: Soft. There is no tenderness.  Genitourinary: Vagina normal and uterus normal. There is no rash or lesion on the right labia. There is no rash or lesion on the left labia. Cervix exhibits no motion tenderness and no discharge. Right adnexum displays no mass and no tenderness. Left adnexum displays no mass and no tenderness. No erythema, tenderness or bleeding around the vagina. No vaginal discharge found.  Chaperone present  Musculoskeletal: She exhibits tenderness (R foot: tenderness to sole of midfoot  extending towards heel, worse with dorsiflexion, no deformity, no rash, no edema, no bruises.  normal R ankle ROM).  Lymphadenopathy:       Right: No inguinal adenopathy present.       Left: No inguinal adenopathy present.  Neurological: She is alert.    ED Course  Procedures (including critical care time)  11:35 AM Pt with vaginal discharge and vaginal itch.  Not currently sexually active.  Pelvic exam performed without evidence concerning for PID.  R foot exam not suspicious for infection or fx/dislocation. Likely plantar fasciitis as pt has pes planus, has pain to sole of foot and worse with dorsiflexion. Recommend RICE for foot pain and also shoe insert to help with arch support.    12:11 PM UA without evidence of urinary tract infection. Pregnant test is negative. Wet prep is significant for many yeast, which supports her primary complaint. Diflucan 150 mg by mouth given in the ED. Care instruction provided. Patient stable for discharge  Labs Reviewed  WET PREP, GENITAL - Abnormal; Notable for the following:    Yeast Wet Prep HPF POC MANY (*)    Clue Cells Wet Prep HPF POC FEW (*)    WBC, Wet Prep HPF POC MANY (*)    All other components within normal limits  URINALYSIS, ROUTINE W REFLEX MICROSCOPIC - Abnormal; Notable for the following:    APPearance CLOUDY (*)    Specific Gravity, Urine 1.039 (*)    Glucose, UA >1000 (*)    Leukocytes, UA TRACE (*)    All other components within normal limits  GC/CHLAMYDIA PROBE AMP  URINE MICROSCOPIC-ADD ON  POCT PREGNANCY, URINE   No results found. 1. Vaginal candidiasis   2. Plantar fasciitis of right foot     MDM  BP 128/81  Pulse 85  Temp(Src) 98.9 F (37.2 C) (Oral)  Resp 16  Ht 5\' 1"  (1.549 m)  Wt 199 lb 2 oz (90.323 kg)  BMI 37.64 kg/m2  SpO2 99%    Fayrene Helper, PA-C 11/12/12 1215

## 2012-11-12 NOTE — ED Notes (Signed)
Patient reports that she has had a white vaginal drainage x 4 days. Patient states she used OTC vaginal cream for yeast infection. Patient also c/o right foot pain and states she is unble to put weight on her right foot. Patient deneis any injury to the right foot.

## 2012-11-12 NOTE — ED Provider Notes (Signed)
Medical screening examination/treatment/procedure(s) were performed by non-physician practitioner and as supervising physician I was immediately available for consultation/collaboration.  Kanai Berrios, MD 11/12/12 1742 

## 2012-11-14 LAB — GC/CHLAMYDIA PROBE AMP
CT Probe RNA: NEGATIVE
GC Probe RNA: NEGATIVE

## 2012-11-18 ENCOUNTER — Other Ambulatory Visit: Payer: Self-pay

## 2012-11-19 ENCOUNTER — Encounter: Payer: Medicaid Other | Admitting: Family Medicine

## 2012-11-30 ENCOUNTER — Ambulatory Visit (INDEPENDENT_AMBULATORY_CARE_PROVIDER_SITE_OTHER): Payer: Medicaid Other | Admitting: Family Medicine

## 2012-11-30 ENCOUNTER — Encounter: Payer: Self-pay | Admitting: Family Medicine

## 2012-11-30 VITALS — BP 111/48 | HR 91 | Temp 99.7°F | Ht 61.0 in | Wt 197.0 lb

## 2012-11-30 DIAGNOSIS — I951 Orthostatic hypotension: Secondary | ICD-10-CM

## 2012-11-30 LAB — COMPREHENSIVE METABOLIC PANEL
Albumin: 4.1 g/dL (ref 3.5–5.2)
Alkaline Phosphatase: 128 U/L — ABNORMAL HIGH (ref 39–117)
BUN: 9 mg/dL (ref 6–23)
CO2: 24 mEq/L (ref 19–32)
Calcium: 9.5 mg/dL (ref 8.4–10.5)
Chloride: 101 mEq/L (ref 96–112)
Glucose, Bld: 262 mg/dL — ABNORMAL HIGH (ref 70–99)
Potassium: 4.1 mEq/L (ref 3.5–5.3)

## 2012-11-30 LAB — POCT GLYCOSYLATED HEMOGLOBIN (HGB A1C): Hemoglobin A1C: 11.7

## 2012-11-30 NOTE — Patient Instructions (Signed)
It was a pleasure meeting you today Ms. Forester,  Today we talked about your dizziness on standing. From your history and exam, you are most likely volume depleted. I would like you to consume at least 8 glasses of water over the next twenty four hours and come back to have your blood pressure and pulse rechecked tomorrow. Before you leave today, please be sure to get your blood drawn for lab work.  Thank you,  Jacquelin Hawking, MD  Orthostatic Hypotension Orthostatic hypotension is a sudden fall in blood pressure. It occurs when a person goes from a sitting or lying position to a standing position. CAUSES   Loss of body fluids (dehydration).  Medicines that lower blood pressure.  Sudden changes in posture, such as sudden standing when you have been sitting or lying down.  Taking too much of your medicine. SYMPTOMS   Lightheadedness or dizziness.  Fainting or near-fainting.  A fast heart rate (tachycardia).  Weakness.  Feeling tired (fatigue). DIAGNOSIS  Your caregiver may find the cause of orthostatic hypotension through:  A history and/or physical exam.  Checking your blood pressure. Your caregiver will check your blood pressure when you are:  Lying down.  Sitting.  Standing.  Tilt table testing. In this test, you are placed on a table that goes from a lying position to a standing position. You will be strapped to the table. This test helps to monitor your blood pressure and heart rate when you are in different positions. TREATMENT   If orthostatic hypotension is caused by your medicines, your caregiver will need to adjust your dosage. Do not stop or adjust your medicine on your own.  When changing positions, make these changes slowly. This allows your body to adjust to the different position.  Compression stockings that are worn on your lower legs may be helpful.  Your caregiver may have you consume extra salt. Do not add extra salt to your diet unless directed by  your caregiver.  Eat frequent, small meals. Avoid sudden standing after eating.  Avoid hot showers or excessive heat.  Your caregiver may give you fluids through the vein (intravenous).  Your caregiver may put you on medicine to help enhance fluid retention. SEEK IMMEDIATE MEDICAL CARE IF:   You faint or have a near-fainting episode. Call your local emergency services (911 in [place]  You have or develop chest pain.  You feel sick to your stomach (nauseous) or vomit.  You have a loss of feeling or movement in your arms or legs.  You have difficulty talking, slurred speech, or you are unable to talk.  You have difficulty thinking or have confused thinking. MAKE SURE YOU:   Understand these instructions.  Will watch your condition.  Will get help right away if you are not doing well or get worse. Document Released: 04/18/2002 Document Revised: 07/21/2011 Document Reviewed: 08/11/2008 [facility] Patient Information 2014 [place], [place].

## 2012-12-01 ENCOUNTER — Ambulatory Visit: Payer: Medicaid Other | Admitting: Emergency Medicine

## 2012-12-01 LAB — CBC WITH DIFFERENTIAL/PLATELET
Basophils Relative: 0 % (ref 0–1)
HCT: 37.9 % (ref 36.0–46.0)
Hemoglobin: 12.2 g/dL (ref 12.0–15.0)
Lymphs Abs: 3 10*3/uL (ref 0.7–4.0)
MCHC: 32.2 g/dL (ref 30.0–36.0)
Monocytes Absolute: 0.5 10*3/uL (ref 0.1–1.0)
Monocytes Relative: 6 % (ref 3–12)
Neutro Abs: 3.7 10*3/uL (ref 1.7–7.7)

## 2012-12-01 NOTE — Progress Notes (Signed)
  Subjective:    Patient ID: [name] [name], female    DOB: January 30, 1986, 27 y.o.   MRN: [id number]  HPI Comments: [name] has had a one week history of lightheadedness upon standing for a few minutes. Symptoms subside when she sits down or rests her head. Her lightheadedness is associated with blurry vision, palpitations and sharp chest pain. She has not had any recent change in medication. She started working and school last week, around the time these symptoms began. She hydrates often and does not consume alcohol or use illicit substances.     Review of Systems  Eyes: Positive for visual disturbance.  Respiratory: Negative for chest tightness and shortness of breath.   Cardiovascular: Positive for chest pain and palpitations. Negative for leg swelling.  Gastrointestinal: Negative for nausea, vomiting and diarrhea.  Endocrine: Negative for polydipsia and polyuria.  Neurological: Positive for dizziness, light-headedness and headaches. Negative for syncope.  Psychiatric/Behavioral: Positive for sleep disturbance.       Objective:   Physical Exam  Constitutional: She is oriented to person, place, and time. She appears well-developed and well-nourished. No distress.  HENT:  Head: Normocephalic and atraumatic.  Cardiovascular: Normal rate, regular rhythm, normal heart sounds and intact distal pulses.   No murmur heard. Pulmonary/Chest: Effort normal and breath sounds normal. No respiratory distress. She has no wheezes.  Abdominal: Soft. She exhibits no distension. There is no tenderness.  Neurological: She is alert and oriented to person, place, and time.  Skin: Skin is warm. She is not diaphoretic.   Orthostatic Vitals Supine: 95/56 Standing: 75/60     Assessment & Plan:

## 2012-12-05 DIAGNOSIS — I951 Orthostatic hypotension: Secondary | ICD-10-CM | POA: Insufficient documentation

## 2012-12-21 ENCOUNTER — Ambulatory Visit: Payer: Medicaid Other | Admitting: Family Medicine

## 2013-01-18 ENCOUNTER — Ambulatory Visit: Payer: Medicaid Other | Admitting: Family Medicine

## 2013-04-26 ENCOUNTER — Encounter (HOSPITAL_COMMUNITY): Payer: Self-pay | Admitting: Emergency Medicine

## 2013-04-26 ENCOUNTER — Emergency Department (HOSPITAL_COMMUNITY)
Admission: EM | Admit: 2013-04-26 | Discharge: 2013-04-26 | Disposition: A | Discharge status: Home / Self Care | Payer: Medicaid Other | Attending: Emergency Medicine | Admitting: Emergency Medicine

## 2013-04-26 DIAGNOSIS — Z79899 Other long term (current) drug therapy: Secondary | ICD-10-CM | POA: Insufficient documentation

## 2013-04-26 DIAGNOSIS — J029 Acute pharyngitis, unspecified: Secondary | ICD-10-CM

## 2013-04-26 DIAGNOSIS — E119 Type 2 diabetes mellitus without complications: Secondary | ICD-10-CM | POA: Insufficient documentation

## 2013-04-26 DIAGNOSIS — Z87440 Personal history of urinary (tract) infections: Secondary | ICD-10-CM | POA: Insufficient documentation

## 2013-04-26 DIAGNOSIS — Z8619 Personal history of other infectious and parasitic diseases: Secondary | ICD-10-CM | POA: Insufficient documentation

## 2013-04-26 DIAGNOSIS — Z8742 Personal history of other diseases of the female genital tract: Secondary | ICD-10-CM | POA: Insufficient documentation

## 2013-04-26 DIAGNOSIS — R51 Headache: Secondary | ICD-10-CM | POA: Insufficient documentation

## 2013-04-26 DIAGNOSIS — I10 Essential (primary) hypertension: Secondary | ICD-10-CM | POA: Insufficient documentation

## 2013-04-26 DIAGNOSIS — E669 Obesity, unspecified: Secondary | ICD-10-CM | POA: Insufficient documentation

## 2013-04-26 NOTE — ED Notes (Signed)
Pt c/o headache x 2 wks and sore throat since yesterday.

## 2013-04-26 NOTE — ED Provider Notes (Signed)
Medical screening examination/treatment/procedure(s) were performed by non-physician practitioner and as supervising physician I was immediately available for consultation/collaboration.  EKG Interpretation   None        Courtney F Horton, MD 04/26/13 2327 

## 2013-04-26 NOTE — ED Provider Notes (Signed)
CSN: [id number]     Arrival date & time 04/26/13  1848 History  This chart was scribed for non-physician practitioner Ruby Cola, PA-C, working with Shon Baton, MD by Dorothey Baseman, ED Scribe. This patient was seen in room WTR9/WTR9 and the patient's care was started at 8:54 PM.    Chief Complaint  Patient presents with  . Sore Throat  . Headache   The history is provided by the patient. No language interpreter was used.   HPI Comments: [name] [name] is a 27 y.o. female who presents to the Emergency Department complaining of sore throat with associated nasal congestion onset yesterday. Patient reports some mild, intermittent, associated pain to the left ear. She denies taking ay medications at home to treat her symptoms. Patient reports that she may have been exposed to sick contacts at school. She denies fever, cough, rhinorrhea, facial swelling, or rashes. Patient denies any allergies.    Patient is also complaining of an intermittent headache to the back of the head onset 2 weeks ago. She reports some associated nausea and emesis when the headaches become severe. She states that the headaches are worse in the evening after school and is exacerbated with bright light and loud sounds. Patient denies any potential injury or trauma to the area. She denies taking any medications at home to treat her symptoms. She denies dizziness or visual disturbance. She denies history of migraines. Patient does not wear glasses or contacts, but states that sometimes she struggles to see far away. Patient reports a history of DM and states that her blood glucose was 119 earlier today.   Past Medical History  Diagnosis Date  . Hypertension   . Herpes simplex without mention of complication   . Obesity   . Diabetes mellitus     type II  . Irregular menses   . Abnormal Pap smear   . GBS (group B streptococcus) UTI complicating pregnancy    Past Surgical History  Procedure Laterality Date  .  Cesarean section  01/30/2011    Procedure: CESAREAN SECTION;  Surgeon: Tilda Burrow, MD;  Location: [facility];  Service: Gynecology;  Laterality: N/A;   Family History  Problem Relation Age of Onset  . Diabetes Paternal Grandmother   . Hypertension Paternal Grandmother   . Diabetes Maternal Grandmother   . Diabetes Father   . Cancer Paternal Uncle    History  Substance Use Topics  . Smoking status: Never Smoker   . Smokeless tobacco: Never Used  . Alcohol Use: No   OB History   Grav Para Term Preterm Abortions TAB SAB Ect Mult Living   2 1 1  1  1   1      Review of Systems  A complete 10 system review of systems was obtained and all systems are negative except as noted in the HPI and PMH.   Allergies  Review of patient's allergies indicates no known allergies.  Home Medications   Current Outpatient Rx  Name  Route  Sig  Dispense  Refill  . etonogestrel (IMPLANON) 68 MG IMPL implant   Subcutaneous   Inject 1 each into the skin continuous. Done in Nov 2012.         . ibuprofen (ADVIL,MOTRIN) 800 MG tablet   Oral   Take 1 tablet (800 mg total) by mouth 3 (three) times daily.   10 tablet   0   . metFORMIN (GLUCOPHAGE) 1000 MG tablet   Oral   Take 1  tablet (1,000 mg total) by mouth 2 (two) times daily.   60 tablet   11    Triage Vitals: BP 131/69  Pulse 89  Temp(Src) 98.7 F (37.1 C) (Oral)  Resp 20  SpO2 100%  Physical Exam  Nursing note and vitals reviewed. Constitutional: She is oriented to person, place, and time. She appears well-developed and well-nourished. No distress.  HENT:  Head: Normocephalic and atraumatic.  Right Ear: Hearing, tympanic membrane and ear canal normal.  Left Ear: Hearing, tympanic membrane and ear canal normal.  Mouth/Throat: Oropharynx is clear and moist. No oropharyngeal exudate.  Bilateral TM and EAC unremarkable.  Minimal erythema posterior pharynx.  No trismus or uvula deviation.   Eyes: Conjunctivae and EOM are normal.  Pupils are equal, round, and reactive to light.  Normal appearance  Neck: Normal range of motion. Neck supple.  No meningismus.   Cardiovascular: Normal rate, regular rhythm, normal heart sounds and intact distal pulses.   Pulmonary/Chest: Effort normal and breath sounds normal. No respiratory distress.  Musculoskeletal: Normal range of motion.  Lymphadenopathy:    She has no cervical adenopathy.  Neurological: She is alert and oriented to person, place, and time. No cranial nerve deficit or sensory deficit. Coordination normal.  CN 3-12 intact.  No sensory deficits.  5/5 and equal upper and lower extremity strength.  No past pointing.   Skin: Skin is warm and dry. No rash noted.  Psychiatric: She has a normal mood and affect. Her behavior is normal.    ED Course  Procedures (including critical care time)  DIAGNOSTIC STUDIES: Oxygen Saturation is 100% on room air, normal by my interpretation.    COORDINATION OF CARE: 9:00 PM- Discussed that the headache may be due to needing glasses and advised patient to follow up with an ophthalmologist/optometrist for further evaluation. Discussed that concern for strep throat is low at this time and that symptoms are likely viral in nature. Advised patient to take ibuprofen or Tylenol and to use chloraseptic spray or warm salt water gargles at home to manage symptoms. Return precautions given. Discussed treatment plan with patient at bedside and patient verbalized agreement.     Labs Review Labs Reviewed - No data to display Imaging Review No results found.  EKG Interpretation   None       MDM   1. Viral pharyngitis   2. Headache    Healthy 27yo F presents w/ multiple complaints.  Has had an intermittent occipital headache for the past 2 weeks.  Non-traumatic.  No fever, focal neuro deficits or meningismus and pt non-toxic and comfortable appearing on exam.  Suspect that pain may be secondary to gradual and mild visual impairment.   Headaches start while she is at school and are most prominent upon arriving home.  She has had to move up to front row to see the board.  I recommended tylenol/motrin for pain and f/u with ophtho.  Advised f/u with PCP or return to ER if pain has not started to improve in 2 weeks. Also c/o sore throat. Based on centor criteria, low suspicion for strep pharyngitis.  Will treat symptomatically for viral infection.  9:25 PM   I personally performed the services described in this documentation, which was scribed in my presence. The recorded information has been reviewed and is accurate.    Otilio Miu, PA-C 04/26/13 2128

## 2013-04-28 ENCOUNTER — Ambulatory Visit: Payer: Medicaid Other | Admitting: Family Medicine

## 2013-06-02 ENCOUNTER — Ambulatory Visit: Payer: Medicaid Other | Admitting: Family Medicine

## 2013-08-02 ENCOUNTER — Ambulatory Visit (INDEPENDENT_AMBULATORY_CARE_PROVIDER_SITE_OTHER): Payer: Medicaid Other | Admitting: Family Medicine

## 2013-08-02 ENCOUNTER — Encounter: Payer: Self-pay | Admitting: Family Medicine

## 2013-08-02 ENCOUNTER — Other Ambulatory Visit (HOSPITAL_COMMUNITY)
Admission: RE | Admit: 2013-08-02 | Discharge: 2013-08-02 | Disposition: A | Discharge status: Home / Self Care | Payer: Medicaid Other | Source: Ambulatory Visit | Attending: Family Medicine | Admitting: Family Medicine

## 2013-08-02 VITALS — BP 119/82 | HR 77 | Temp 99.2°F | Wt 205.0 lb

## 2013-08-02 DIAGNOSIS — IMO0001 Reserved for inherently not codable concepts without codable children: Secondary | ICD-10-CM

## 2013-08-02 DIAGNOSIS — Z01419 Encounter for gynecological examination (general) (routine) without abnormal findings: Secondary | ICD-10-CM

## 2013-08-02 DIAGNOSIS — R8789 Other abnormal findings in specimens from female genital organs: Secondary | ICD-10-CM

## 2013-08-02 DIAGNOSIS — J309 Allergic rhinitis, unspecified: Secondary | ICD-10-CM

## 2013-08-02 DIAGNOSIS — Z124 Encounter for screening for malignant neoplasm of cervix: Secondary | ICD-10-CM

## 2013-08-02 DIAGNOSIS — E1165 Type 2 diabetes mellitus with hyperglycemia: Principal | ICD-10-CM

## 2013-08-02 LAB — LIPID PANEL
CHOL/HDL RATIO: 3.4 ratio
Cholesterol: 134 mg/dL (ref 0–200)
HDL: 40 mg/dL (ref >39)
LDL CALC: 74 mg/dL (ref 0–99)
Triglycerides: 98 mg/dL (ref <150)
VLDL: 20 mg/dL (ref 0–40)

## 2013-08-02 LAB — POCT GLYCOSYLATED HEMOGLOBIN (HGB A1C): Hemoglobin A1C: 10.6

## 2013-08-02 MED ORDER — GLUCOSE BLOOD VI STRP: ORAL_STRIP | Status: DC

## 2013-08-02 MED ORDER — SITAGLIPTIN PHOSPHATE 100 MG PO TABS: 100.0000 mg | ORAL_TABLET | Freq: Every day | ORAL | Status: DC

## 2013-08-02 MED ORDER — ACCU-CHEK FASTCLIX LANCETS MISC: Status: DC

## 2013-08-02 MED ORDER — GLIMEPIRIDE 4 MG PO TABS
4.0000 mg | ORAL_TABLET | Freq: Every day | ORAL | Status: DC
Start: 2013-08-02 — End: 2014-05-15

## 2013-08-02 MED ORDER — METFORMIN HCL 1000 MG PO TABS: 1000.0000 mg | ORAL_TABLET | Freq: Two times a day (BID) | ORAL | Status: DC

## 2013-08-02 MED ORDER — ACCU-CHEK NANO SMARTVIEW W/DEVICE KIT: PACK | Status: DC

## 2013-08-02 NOTE — Progress Notes (Signed)
  Subjective:     [name] [name] is a 28 y.o. female here for a routine exam.  Patient's last menstrual period was 06/22/2013. [id number] Current complaints: Reports some difficulty sleeping.    Gynecologic History Patient's last menstrual period was 06/22/2013. Contraception: Implanon Last Pap: 09/26/2010. Pap was normal.  Past Medical History  Diagnosis Date  . Hypertension   . Herpes simplex without mention of complication   . Obesity   . Diabetes mellitus     type II  . Irregular menses   . Abnormal Pap smear   . GBS (group B streptococcus) UTI complicating pregnancy     Past Surgical History  Procedure Laterality Date  . Cesarean section  01/30/2011    Procedure: CESAREAN SECTION;  Surgeon: Tilda BurrowJohn V Ferguson, MD;  Location: [facility];  Service: Gynecology;  Laterality: N/A;    OB History   Grav Para Term Preterm Abortions TAB SAB Ect Mult Living   2 1 1  1  1   1       History   Social History  . Marital Status: Single    Spouse Name: N/A    Number of Children: N/A  . Years of Education: N/A   Social History Main Topics  . Smoking status: Never Smoker   . Smokeless tobacco: Never Used  . Alcohol Use: No  . Drug Use: No  . Sexual Activity: Not Currently    Birth Control/ Protection: Implant   Other Topics Concern  . None   Social History Narrative  . None    Family History  Problem Relation Age of Onset  . Diabetes Paternal Grandmother   . Hypertension Paternal Grandmother   . Diabetes Maternal Grandmother   . Diabetes Father   . Cancer Paternal Uncle      Review of Systems Positive for headaches, occasional chest pain, stress, sadness, dizziness, difficulty sleeping.     Objective:   Filed Vitals:   08/02/13 1418  BP: 119/82  Pulse: 77  Temp: 99.2 F (37.3 C)   Exam: General: well appearing female in NAD. Cardiovascular: RRR. No murmurs, rubs, or gallops. Respiratory: CTAB. No rales, rhonchi, or wheeze. Abdomen: obese, soft, nontender,  nondistended. Extremities: warm, well perfused. No LE edema. Skin: Warm, dry, intact. Neuro: No focal deficits. Pelvic Exam:        External: normal female genitalia without lesions or masses        Vagina: normal without lesions or masses        Cervix: normal without lesions or masses        Adnexa: normal bimanual exam without masses or fullness        Uterus: normal by palpation        Pap smear: performed Breasts: breasts appear normal, no appreciable masses, no skin or nipple changes or axillary nodes.   Assessment/Plan:  See Problem List  25 minutes were spent face-to-face with the patient during this encounter and over half of that time was spent on counseling and coordination of care.

## 2013-08-02 NOTE — Progress Notes (Deleted)
   Subjective:    Patient ID: [name] [name], female    DOB: 02/20/1986, 28 y.o.   MRN: [id number]  HPI 28 year old female presents for her annual wellness visit.   PMH reviewed: Past Medical History  Diagnosis Date  . Hypertension   . Herpes simplex without mention of complication   . Obesity   . Diabetes mellitus     type II  . Irregular menses   . Abnormal Pap smear   . GBS (group B streptococcus) UTI complicating pregnancy      Review of Systems     Objective:   Physical Exam        Assessment & Plan:

## 2013-08-02 NOTE — Patient Instructions (Signed)
It was nice to see you today.  Take the medications as prescribed. They are at your pharmacy.  Follow up with me in 3 months.  You will receive a call with your pap smear results.

## 2013-08-02 NOTE — Assessment & Plan Note (Signed)
Pap smear and breast exam performed today.  

## 2013-08-02 NOTE — Assessment & Plan Note (Addendum)
A1C 10.6 today. Patient is not currently taking any medications for this as she is unable to tolerate metformin. Starting patient on Amaryl and Januvia today.  Meter/strips/lancets also given. Follow up in 3 months.

## 2013-08-08 ENCOUNTER — Encounter: Payer: Self-pay | Admitting: Family Medicine

## 2013-09-29 ENCOUNTER — Encounter: Payer: Self-pay | Admitting: Family Medicine

## 2013-09-29 ENCOUNTER — Ambulatory Visit (INDEPENDENT_AMBULATORY_CARE_PROVIDER_SITE_OTHER): Payer: Medicaid Other | Admitting: Family Medicine

## 2013-09-29 VITALS — BP 122/81 | HR 87 | Temp 98.0°F | Wt 207.0 lb

## 2013-09-29 DIAGNOSIS — L639 Alopecia areata, unspecified: Secondary | ICD-10-CM | POA: Insufficient documentation

## 2013-09-29 DIAGNOSIS — IMO0002 Reserved for concepts with insufficient information to code with codable children: Secondary | ICD-10-CM

## 2013-09-29 DIAGNOSIS — E1165 Type 2 diabetes mellitus with hyperglycemia: Secondary | ICD-10-CM

## 2013-09-29 DIAGNOSIS — IMO0001 Reserved for inherently not codable concepts without codable children: Secondary | ICD-10-CM

## 2013-09-29 NOTE — Assessment & Plan Note (Signed)
Patient compliant with medications. Patient tolerating them well. Follow up in 1 month for repeat A1C.

## 2013-09-29 NOTE — Assessment & Plan Note (Signed)
Patient's hair loss consistent with alopecia. Recommended first-line therapy is intralesional corticosteroid injection. Will place referral to Derm for injection.

## 2013-09-29 NOTE — Progress Notes (Signed)
   Subjective:    Patient ID: [name] [name], female    DOB: 1985/08/07, 28 y.o.   MRN: [id number]  HPI 28 year old female with DM-2 presents for a same day appointment with complaints of hair loss.  1) Hair loss - Patient reports she knows her loss at the beginning of April after starting Januvia.  - She states it has worsened slightly.  She currently has 2 patches of hair loss which she is concerned about. - No other associated symptoms: Itching, redness, irritation.   2) Diabetes mellitus, type 2 Disease Monitoring  Blood Sugar Ranges: 120's - 250  Polyuria: no   Visual problems: no   Medication Compliance: yes; Compliant with Januvia and Amaryl. Medication Side Effects   Hypoglycemia: no   Review of Systems Per HPI     Objective:   Physical Exam Filed Vitals:   09/29/13 1459  BP: 122/81  Pulse: 87  Temp: 98 F (36.7 C)   Exam: General: well appearing, NAD. Skin/Scalp: 1 large patch of circumferential hair loss noted on the left temporal scalp.  No erythema or irritation noted. No clinical signs of tinea.  Additional small patch of hair loss located around the right temple.    Assessment & Plan:  See Problem List

## 2013-09-29 NOTE — Patient Instructions (Signed)
It was nice to see you today.  Your hair loss is not related to Januvia, so keep taking it.    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  Follow up in 1 month for diabetes recheck.  Alopecia Areata Alopecia areata is a self-destructing (autoimmune) disease that results in the loss of hair. In this condition your body's immune system attacks the hair follicle. The hair follicle is responsible for growing hair. Hair loss can occur on the scalp and other parts of the body. It usually starts as one or more small, round, smooth patches of hair loss. It occurs in males and females of all ages and races, but usually starts before age 26. The scalp is the most commonly affected area, but the beard or any hair-bearing site can be affected. This type of hair loss does not leave scars where the hair was lost.  Many people with alopecia areata only have a few patches of hair loss. In others, extensive patchy hair loss occurs. In a few people, all scalp hair is lost (alopecia totalis), or hair is lost from the entire scalp and body (alopecia universalis). No matter how widespread the hair loss, the hair follicles remain alive and are ready to resume normal hair production whenever they receive the correct signal. Hair re-growth may occur without treatment and can even restart after years of hair loss.  CAUSES  It is thought that something triggers the immune system to stop hair growth. It is not always known what the cause is. Some people have genetic markers that can increase the chance of developing alopecia areata. Alopecia areata often occurs in families whose members have had:  Asthma.  Hay fever.  Atopic eczema.  Some autoimmune diseases may also be a trigger, such as:  Thyroid disease.  Diabetes.  Rheumatoid arthritis.  Lupus erythematosus.  Vitiligo.  Pernicious anemia.  Addison's disease. OTHER SYMPTOMS In some people, the nail beds may develop rows of tiny dents (stippling)  or the nail beds can become distorted. Other than the hair and nail beds, no other body part is affected.  PROGNOSIS  Alopecia areata is not medically disabling. People with alopecia areata are usually in excellent health. Hair loss can be emotionally difficult. The National Alopecia Areata Foundation has resources available to help individuals and families with alopecia areata. Their goal is to help people with the condition live full, productive lives. There are many successful, well-adjusted, contented people living with Alopecia areata. Alopecia areata can be overcome with:  A positive self image.  Sound medical facts.  The support of others, such as:  Sometimes professional counseling is helpful to develop one's self-confidence and positive self-image. TREATMENT  There is no cure for alopecia areata. There are several available treatments. Treatments are most effective in milder cases. No treatment is effective for everyone. Choice of treatment depends mainly on a person's age and the extent of their hair loss. Alopecia areata occurs in two forms:   A mild patchy form where less than 50 percent of scalp hair is lost.  An extensive form where greater than 50 percent of scalp hair is lost. These two forms of alopecia areata behave quite differently, and the choice of treatment depends on which form is present. Current treatments do not turn alopecia areata off. They can stimulate the hair follicle to produce hair.  Some medications used to treat mild cases include:  Cortisone injections. The most common treatment is the injection of cortisone into the bare  skin patches. The injections are usually given by a caregiver specializing in skin issues (dermatologist). This caregiver will use a tiny needle to give multiple injections into the skin in and around the bare patches. The injections are repeated once a month. If new hair growth occurs, it is usually visible within 4 weeks. Treatment does  not prevent new patches of hair loss from developing. There are few side effects from local cortisone injections. Occasionally, temporary dents (depressions) in the skin result from the local injections, but these dents can fill in by themselves.  Topical minoxidil. Five percent topical minoxidil solution applied twice daily may grow hair in alopecia areata. Scalp, eyebrows, and beard hair may respond. If scalp hair re-grows completely, treatment can be stopped. Response may improve if topical cortisone cream is applied 30 minutes after the minoxidil. Topical minoxidil is safe, easy to use, and does not lower blood pressure in persons with normal blood pressure. Minoxidil can lead to unwanted facial hair growth in some people.  Anthralin cream or ointment. Another treatment is the application of anthralin cream or ointment. Anthralin is a tar-like substance that has been used widely for psoriasis. Anthralin is applied to the bare patches once daily. It is washed off after a short time, usually 30 to 60 minutes later. If new hair growth occurs, it is seen in 8 to 12 weeks. Anthralin can be irritating to the skin. It can cause temporary, brownish discoloration of the treated skin. By using short treatment times, skin irritation and skin staining are reduced without decreasing effectiveness. Care must be taken not to get anthralin in the eyes. Some of the medications used for more extensive cases where there is greater than 50% hair loss include:  Cortisone pills. Cortisone pills are sometimes given for extensive scalp hair loss. Cortisone taken internally is much stronger than local injections of cortisone into the skin. It is necessary to discuss possible side effects of cortisone pills with your caregiver. In general, however, cortisone pills are used in relatively few patients with alopecia areata due to health risks from prolonged use. Also, hair that has grown is likely to fall out when the cortisone pills  are stopped.  Topical minoxidil. See previous explanation under mild, patchy alopecia areata. However, minoxidil is not effective in total loss of scalp hair (alopecia totalis).  Topical immunotherapy. Another method of treating alopecia areata or alopecia totalis/universalis involves producing an allergic rash or allergic contact dermatitis. Chemicals such as diphencyprone (DPCP) or squaric acid dibutyl ester (SADBE) are applied to the scalp to produce an allergic rash which resembles poison oak or ivy. Approximately 40% of patients treated with topical immunotherapy will re-grow scalp hair after about 6 months of treatment. Those who do successfully re-grow scalp hair will need to continue treatment to maintain hair re-growth.  Wigs. For extensive hair loss, a wig can be an important option for some people. Proper attention will make a quality wig look completely natural. A wig will need to be cut, thinned, and styled. To keep a net base wig from falling off, special double-sided tape can be purchased in beauty supply outlets and fastened to the inside of the wig.  For those with completely bare heads, there are suction caps to which any wig can be attached. There are also entire suction cap wig units. FOR MORE INFORMATION National Alopecia Areata Foundation: [email or web address] Document Released: 12/01/2003 Document Revised: 07/21/2011 Document Reviewed: 07/04/2008 [facility]ExitCare Patient Information 2014 [place], [place].

## 2013-11-30 ENCOUNTER — Ambulatory Visit (INDEPENDENT_AMBULATORY_CARE_PROVIDER_SITE_OTHER): Payer: Medicaid Other | Admitting: Family Medicine

## 2013-11-30 DIAGNOSIS — Z3046 Encounter for surveillance of implantable subdermal contraceptive: Secondary | ICD-10-CM

## 2013-11-30 NOTE — Progress Notes (Addendum)
   Subjective:    Patient ID: [name] [name], female    DOB: [birth year], 28 y.o.   MRN: [id number]  HPI 28 year old African American female presents to the clinic today for nexplanon removal.  Patient's nexplanon was placed in November of 2012.  She's planning on having another child and would like to get pregnant in the near future.  Therefore, she presents for removal today.  She denies any current complaints and states she is doing well.  Review of Systems Per HPI    Objective:   Physical Exam General: Well-appearing obese female in no acute distress. Extremities: Left arm - Nexplanon easily palpable.  Surrounding area with no erythema or open lesions.   Procedure:  Nexplanon Removal Patient was given informed consent for removal of her Nexplanon.  Appropriate time out taken. Nexplanon site identified.  Area prepped in usual sterile fashon. 3 ml of 1% lidocaine was used to anesthetize the area at the distal end of the implant. A small stab incision was made right beside the implant on the distal portion.  The Nexplanon rod was grasped using hemostats and removed without difficulty.  There was minimal blood loss. There were no complications. Dteri-strips were applied over the small incision.  A pressure bandage was applied to reduce any bruising.  The patient tolerated the procedure well.     Assessment & Plan:  28 year old female here for nexplanon removal.  1) Nexplanon removal. - Patient desires pregnancy. - Nexplanon removed as above without difficulty.

## 2014-02-20 ENCOUNTER — Ambulatory Visit: Payer: Medicaid Other | Admitting: Family Medicine

## 2014-03-13 ENCOUNTER — Encounter: Payer: Self-pay | Admitting: Family Medicine

## 2014-03-24 ENCOUNTER — Encounter (HOSPITAL_COMMUNITY): Payer: Self-pay | Admitting: *Deleted

## 2014-03-24 ENCOUNTER — Inpatient Hospital Stay (HOSPITAL_COMMUNITY)
Admission: AD | Admit: 2014-03-24 | Discharge: 2014-03-24 | Disposition: A | Discharge status: Home / Self Care | Payer: Medicaid Other | Source: Ambulatory Visit | Attending: Obstetrics and Gynecology | Admitting: Obstetrics and Gynecology

## 2014-03-24 ENCOUNTER — Inpatient Hospital Stay (HOSPITAL_COMMUNITY): Payer: Medicaid Other

## 2014-03-24 ENCOUNTER — Inpatient Hospital Stay (HOSPITAL_COMMUNITY)
Admission: AD | Admit: 2014-03-24 | Discharge: 2014-03-24 | Discharge status: Against Medical Advice | Payer: Medicaid Other | Source: Ambulatory Visit | Attending: Obstetrics and Gynecology | Admitting: Obstetrics and Gynecology

## 2014-03-24 DIAGNOSIS — O10011 Pre-existing essential hypertension complicating pregnancy, first trimester: Secondary | ICD-10-CM | POA: Insufficient documentation

## 2014-03-24 DIAGNOSIS — R109 Unspecified abdominal pain: Secondary | ICD-10-CM | POA: Diagnosis present

## 2014-03-24 DIAGNOSIS — B373 Candidiasis of vulva and vagina: Secondary | ICD-10-CM | POA: Diagnosis present

## 2014-03-24 DIAGNOSIS — O26899 Other specified pregnancy related conditions, unspecified trimester: Secondary | ICD-10-CM

## 2014-03-24 DIAGNOSIS — O24111 Pre-existing diabetes mellitus, type 2, in pregnancy, first trimester: Secondary | ICD-10-CM | POA: Insufficient documentation

## 2014-03-24 DIAGNOSIS — Z3A01 Less than 8 weeks gestation of pregnancy: Secondary | ICD-10-CM | POA: Diagnosis not present

## 2014-03-24 DIAGNOSIS — N390 Urinary tract infection, site not specified: Secondary | ICD-10-CM

## 2014-03-24 DIAGNOSIS — O24911 Unspecified diabetes mellitus in pregnancy, first trimester: Secondary | ICD-10-CM

## 2014-03-24 DIAGNOSIS — B3731 Acute candidiasis of vulva and vagina: Secondary | ICD-10-CM | POA: Diagnosis present

## 2014-03-24 DIAGNOSIS — E119 Type 2 diabetes mellitus without complications: Secondary | ICD-10-CM | POA: Insufficient documentation

## 2014-03-24 DIAGNOSIS — O24919 Unspecified diabetes mellitus in pregnancy, unspecified trimester: Secondary | ICD-10-CM

## 2014-03-24 DIAGNOSIS — O2341 Unspecified infection of urinary tract in pregnancy, first trimester: Secondary | ICD-10-CM | POA: Diagnosis not present

## 2014-03-24 LAB — HCG, QUANTITATIVE, PREGNANCY: hCG, Beta Chain, Quant, S: 15550 m[IU]/mL — ABNORMAL HIGH (ref <5)

## 2014-03-24 LAB — CBC
HEMATOCRIT: 37.8 % (ref 36.0–46.0)
Hemoglobin: 12.6 g/dL (ref 12.0–15.0)
MCH: 27.5 pg (ref 26.0–34.0)
MCHC: 33.3 g/dL (ref 30.0–36.0)
MCV: 82.5 fL (ref 78.0–100.0)
Platelets: 347 10*3/uL (ref 150–400)
RBC: 4.58 MIL/uL (ref 3.87–5.11)
RDW: 13.7 % (ref 11.5–15.5)
WBC: 7.9 10*3/uL (ref 4.0–10.5)

## 2014-03-24 LAB — URINALYSIS, ROUTINE W REFLEX MICROSCOPIC
Bilirubin Urine: NEGATIVE
KETONES UR: NEGATIVE mg/dL
NITRITE: POSITIVE — AB
PROTEIN: NEGATIVE mg/dL
Specific Gravity, Urine: 1.01 (ref 1.005–1.030)
UROBILINOGEN UA: 0.2 mg/dL (ref 0.0–1.0)
pH: 6.5 (ref 5.0–8.0)

## 2014-03-24 LAB — URINE MICROSCOPIC-ADD ON

## 2014-03-24 LAB — HIV ANTIBODY (ROUTINE TESTING W REFLEX): HIV 1&2 Ab, 4th Generation: NONREACTIVE

## 2014-03-24 LAB — WET PREP, GENITAL
Clue Cells Wet Prep HPF POC: NONE SEEN
Trich, Wet Prep: NONE SEEN

## 2014-03-24 LAB — OB RESULTS CONSOLE GC/CHLAMYDIA
Chlamydia: NEGATIVE
Gonorrhea: NEGATIVE

## 2014-03-24 LAB — POCT PREGNANCY, URINE: Preg Test, Ur: POSITIVE — AB

## 2014-03-24 MED ORDER — NITROFURANTOIN MONOHYD MACRO 100 MG PO CAPS: 100.0000 mg | ORAL_CAPSULE | Freq: Two times a day (BID) | ORAL | Status: DC

## 2014-03-24 MED ORDER — TERCONAZOLE 0.4 % VA CREA: 1.0000 | TOPICAL_CREAM | Freq: Every day | VAGINAL | Status: DC

## 2014-03-24 NOTE — MAU Note (Signed)
Bad pain under her stomach for about a wk. constant

## 2014-03-24 NOTE — Discharge Instructions (Signed)

## 2014-03-24 NOTE — MAU Note (Signed)
Pt stated she had to leave to pick up her brother from practice. Will come back later tonight. Told her she could return at anytime and we would have results and could do u/S as well. Pt agree. Notified K.Clark,PA to pt decision to leave and come back.

## 2014-03-24 NOTE — MAU Provider Note (Signed)
History     CSN: [id number]  Arrival date and time: 03/24/14 1605   First Provider Initiated Contact with Patient 03/24/14 1749      Chief Complaint  Patient presents with  . Abdominal Pain   HPI [name] [name] 28 y.o. [id number] @[redacted]w[redacted]d  presents to MAU complaining of abdominal pain and nausea.  Her symptoms started a week ago.  The pain feels like cramping it's in the middle of her lower abdomen and is rated 6/10.  It is intermittent and there are spans of hours with no pain but it is hurting daily.   She had vaginal bleeding 11/1-11/2 and was heavy.  No bleeding since that time.  She denies vomiting, fever, weakness.  She has also been having headache.  She did not take her diabetes medications today.   OB History    Gravida Para Term Preterm AB TAB SAB Ectopic Multiple Living   4 1 1  2  2   1       Past Medical History  Diagnosis Date  . Hypertension   . Herpes simplex without mention of complication   . Obesity   . Diabetes mellitus     type II  . Irregular menses   . Abnormal Pap smear   . GBS (group B streptococcus) UTI complicating pregnancy     Past Surgical History  Procedure Laterality Date  . Cesarean section  01/30/2011    Procedure: CESAREAN SECTION;  Surgeon: [name], MD;  Location: [facility];  Service: Gynecology;  Laterality: N/A;    Family History  Problem Relation Age of Onset  . Diabetes Paternal Grandmother   . Hypertension Paternal Grandmother   . Diabetes Maternal Grandmother   . Diabetes Father   . Cancer Paternal Uncle     History  Substance Use Topics  . Smoking status: Never Smoker   . Smokeless tobacco: Never Used  . Alcohol Use: No    Allergies: No Known Allergies  Prescriptions prior to admission  Medication Sig Dispense Refill Last Dose  . acetaminophen (TYLENOL) 325 MG tablet Take 325 mg by mouth every 6 (six) hours as needed for mild pain or headache.   03/23/2014 at Unknown time  . glimepiride (AMARYL) 4 MG tablet Take 1  tablet (4 mg total) by mouth daily before breakfast. 30 tablet 6 03/23/2014 at Unknown time  . sitaGLIPtin (JANUVIA) 100 MG tablet Take 1 tablet (100 mg total) by mouth daily. 30 tablet 6 03/23/2014 at Unknown time  . ACCU-CHEK FASTCLIX LANCETS MISC Check sugar up to 6 x daily 204 each 3   . Blood Glucose Monitoring Suppl (ACCU-CHEK NANO SMARTVIEW) W/DEVICE KIT Use up to 6 times daily to test blood sugars 1 kit 0   . etonogestrel (IMPLANON) 68 MG IMPL implant Inject 1 each into the skin continuous. Done in Nov 2012.   current at unknown  . glucose blood (ACCU-CHEK SMARTVIEW) test strip Check sugar 6 x daily 200 each 3     ROS  Pertinent ROS in HPI Physical Exam   Blood pressure 134/77, pulse 91, temperature 99.5 F (37.5 C), temperature source Oral, resp. rate 18, height 5' (1.524 m), weight 209 lb (94.802 kg), last menstrual period 03/12/2014.  Physical Exam  Constitutional: She is oriented to person, place, and time. She appears well-developed and well-nourished.  HENT:  Head: Normocephalic and atraumatic.  Eyes: EOM are normal.  Neck: Normal range of motion.  Cardiovascular: Normal rate, regular rhythm and normal heart sounds.  Respiratory: Effort normal and breath sounds normal.  GI: Soft. Bowel sounds are normal. She exhibits no distension. There is no tenderness. There is no rebound and no guarding.  Neurological: She is alert and oriented to person, place, and time.  Skin: Skin is warm and dry.  Psychiatric: She has a normal mood and affect.  GU: External genitalia with signs of chronic yeast: lack of hair, erythematous and swollen-appearing.   Vagina with large amt of thick white adherent clumpy discharge.  Mucosa is highly erythematous.  No CMT, adnexal pain or tenderness.     MAU Course  Procedures  MDM Yeast noted on wet prep.  Chronic diabetes with poor management.  UTI suggested on U/A.   IUP with cardiac activity seen on U/S.    Assessment and Plan  A: Abdominal  pain in pregnancy, yeast, UTI, Diabetes  P: Discharge to home Take medications for diabetes and see DM provider for further management Obtain [place] for UTI Terazole 7 for yeast PNV qd Obtain [facility] asap Urine cx pending Patient may return to MAU as needed or if her condition were to change or worsen  Paticia Stack 03/24/2014, 5:50 PM

## 2014-03-24 NOTE — MAU Note (Signed)
Pt returned to complete hr evaluation.

## 2014-03-25 LAB — GC/CHLAMYDIA PROBE AMP
CT PROBE, AMP APTIMA: NEGATIVE
GC Probe RNA: NEGATIVE

## 2014-03-26 LAB — CULTURE, OB URINE

## 2014-03-27 ENCOUNTER — Telehealth (HOSPITAL_COMMUNITY): Payer: Self-pay | Admitting: Obstetrics and Gynecology

## 2014-03-27 ENCOUNTER — Telehealth: Payer: Self-pay

## 2014-03-27 MED ORDER — AMOXICILLIN 500 MG PO CAPS: 500.0000 mg | ORAL_CAPSULE | Freq: Three times a day (TID) | ORAL | Status: DC

## 2014-03-27 NOTE — Telephone Encounter (Signed)
-----   Message from Debbrah AlarJennifer Irene Rasch, NP sent at 03/27/2014  8:37 AM EST ----- +GBS in urine Amox sent to pharmacy Attempted to call both numbers on her chart.

## 2014-03-27 NOTE — Telephone Encounter (Signed)
Letter sent.

## 2014-03-27 NOTE — Telephone Encounter (Signed)
+  GBS in urine. Amox sent to pharmacy. Clinic to send letter.

## 2014-04-10 ENCOUNTER — Other Ambulatory Visit: Payer: Medicaid Other

## 2014-04-17 ENCOUNTER — Encounter: Payer: Self-pay | Admitting: Family Medicine

## 2014-04-17 ENCOUNTER — Ambulatory Visit (INDEPENDENT_AMBULATORY_CARE_PROVIDER_SITE_OTHER): Payer: Medicaid Other | Admitting: Family Medicine

## 2014-04-17 VITALS — BP 128/81 | HR 91 | Temp 98.3°F | Wt 207.8 lb

## 2014-04-17 DIAGNOSIS — O0991 Supervision of high risk pregnancy, unspecified, first trimester: Secondary | ICD-10-CM | POA: Insufficient documentation

## 2014-04-17 MED ORDER — DOXYLAMINE-PYRIDOXINE 10-10 MG PO TBEC: 2.0000 | DELAYED_RELEASE_TABLET | Freq: Every day | ORAL | Status: DC

## 2014-04-17 NOTE — Patient Instructions (Signed)
Great to meet you!  You will need to follow up in the high risk ob clinic  You can continue to take your current meds  Watch your diet carefully Diet Recommendations for Diabetes   Starchy (carb) foods include: Bread, rice, pasta, potatoes, corn, crackers, bagels, muffins, all baked goods.   Protein foods include: Meat, fish, poultry, eggs, dairy foods, and beans such as pinto and kidney beans (beans also provide carbohydrate).   1. Eat at least 3 meals and 1-2 snacks per day. Never go more than 4-5 hours while awake without eating.  2. Limit starchy foods to TWO per meal and ONE per snack. ONE portion of a starchy  food is equal to the following:   - ONE slice of bread (or its equivalent, such as half of a hamburger bun).   - 1/2 cup of a "scoopable" starchy food such as potatoes or rice.   - 1 OUNCE (28 grams) of starchy snack foods such as crackers or pretzels (look on label).   - 15 grams of carbohydrate as shown on food label.  3. Both lunch and dinner should include a protein food, a carb food, and vegetables.   - Obtain twice as many veg's as protein or carbohydrate foods for both lunch and dinner.   - Try to keep frozen veg's on hand for a quick vegetable serving.     - Fresh or frozen veg's are best.  4. Breakfast should always include protein.    First Trimester of Pregnancy The first trimester of pregnancy is from week 1 until the end of week 12 (months 1 through 3). During this time, your baby will begin to develop inside you. At 6-8 weeks, the eyes and face are formed, and the heartbeat can be seen on ultrasound. At the end of 12 weeks, all the baby's organs are formed. Prenatal care is all the medical care you receive before the birth of your baby. Make sure you get good prenatal care and follow all of your doctor's instructions. HOME CARE  Medicines  Take medicine only as told by your doctor. Some medicines are safe and some are not during pregnancy.  Take your prenatal  vitamins as told by your doctor.  Take medicine that helps you poop (stool softener) as needed if your doctor says it is okay. Diet  Eat regular, healthy meals.  Your doctor will tell you the amount of weight gain that is right for you.  Avoid raw meat and uncooked cheese.  If you feel sick to your stomach (nauseous) or throw up (vomit):  Eat 4 or 5 small meals a day instead of 3 large meals.  Try eating a few soda crackers.  Drink liquids between meals instead of during meals.  If you have a hard time pooping (constipation):  Eat high-fiber foods like fresh vegetables, fruit, and whole grains.  Drink enough fluids to keep your pee (urine) clear or pale yellow. Activity and Exercise  Exercise only as told by your doctor. Stop exercising if you have cramps or pain in your lower belly (abdomen) or low back.  Try to avoid standing for long periods of time. Move your legs often if you must stand in one place for a long time.  Avoid heavy lifting.  Wear low-heeled shoes. Sit and stand up straight.  You can have sex unless your doctor tells you not to. Relief of Pain or Discomfort  Wear a good support bra if your breasts are sore.  Take  warm water baths (sitz baths) to soothe pain or discomfort caused by hemorrhoids. Use hemorrhoid cream if your doctor says it is okay.  Rest with your legs raised if you have leg cramps or low back pain.  Wear support hose if you have puffy, bulging veins (varicose veins) in your legs. Raise (elevate) your feet for 15 minutes, 3-4 times a day. Limit salt in your diet. Prenatal Care  Schedule your prenatal visits by the twelfth week of pregnancy.  Write down your questions. Take them to your prenatal visits.  Keep all your prenatal visits as told by your doctor. Safety  Wear your seat belt at all times when driving.  Make a list of emergency phone numbers. The list should include numbers for family, friends, the hospital, and police and  fire departments. General Tips  Ask your doctor for a referral to a local prenatal class. Begin classes no later than at the start of month 6 of your pregnancy.  Ask for help if you need counseling or help with nutrition. Your doctor can give you advice or tell you where to go for help.  Do not use hot tubs, steam rooms, or saunas.  Do not douche or use tampons or scented sanitary pads.  Do not cross your legs for long periods of time.  Avoid litter boxes and soil used by cats.  Avoid all smoking, herbs, and alcohol. Avoid drugs not approved by your doctor.  Visit your dentist. At home, brush your teeth with a soft toothbrush. Be gentle when you floss. GET HELP IF:  You are dizzy.  You have mild cramps or pressure in your lower belly.  You have a nagging pain in your belly area.  You continue to feel sick to your stomach, throw up, or have watery poop (diarrhea).  You have a bad smelling fluid coming from your vagina.  You have pain with peeing (urination).  You have increased puffiness (swelling) in your face, hands, legs, or ankles. GET HELP RIGHT AWAY IF:   You have a fever.  You are leaking fluid from your vagina.  You have spotting or bleeding from your vagina.  You have very bad belly cramping or pain.  You gain or lose weight rapidly.  You throw up blood. It may look like coffee grounds.  You are around people who have [place] measles, fifth disease, or chickenpox.  You have a very bad headache.  You have shortness of breath.  You have any kind of trauma, such as from a fall or a car accident. Document Released: 10/15/2007 Document Revised: 09/12/2013 Document Reviewed: 03/08/2013 [facility] Patient Information 2015 [place], [place]. This information is not intended to replace advice given to you by your health care provider. Make sure you discuss any questions you have with your health care provider.

## 2014-04-17 NOTE — Progress Notes (Signed)
[name] [name] is a 28 y.o. yo [phone] at 2626w6d who presents for her initial prenatal visit. Pregnancy  isplanned She reports morning sickness and nausea. She  is notTaking PNV. See flow sheet for details.  PMH, POBH, FH, meds, allergies and Social Hx reviewed.  Prenatal exam: Gen: Well nourished, well developed.  No distress.  Vitals noted. HEENT: Normocephalic, atraumatic.  Neck supple without cervical lymphadenopathy,  CV: RRR no murmur, gallops or rubs Lungs: CTA B.  Normal respiratory effort without wheezes or rales. Abd: soft, NTND. +BS.  Uterus not appreciated above pelvis, Uterus size palpable halfway to umbilicus Ext: No clubbing, cyanosis or edema. Psych: Normal grooming and dress.      Assessment/plan: 1) Pregnancy 2826w6d doing well.  Current pregnancy issues include Diabetes, FOB with brain tumor and seizure d/o Dating is reliable by 6 week US Prenatal labs reviewed, notable for GBS + urine culture Bleeding and pain precautions reviewed. Importance of prenatal vitamins reviewed. - recommended 2 flinstone vitamins daily as she isn't tolerating pills Genetic screening offered.  Early glucola is not indicated as she has known T2DM  Fetal heart movement identified on Bedside US.   Referred to high risk OB.  Continue current meds for DM2 Januvia and amaryl Given Diclegis for nasuea

## 2014-05-01 ENCOUNTER — Encounter: Payer: Medicaid Other | Attending: Obstetrics and Gynecology | Admitting: *Deleted

## 2014-05-01 ENCOUNTER — Other Ambulatory Visit: Payer: Self-pay | Admitting: Obstetrics and Gynecology

## 2014-05-01 ENCOUNTER — Encounter: Payer: Self-pay | Admitting: Obstetrics and Gynecology

## 2014-05-01 ENCOUNTER — Ambulatory Visit (INDEPENDENT_AMBULATORY_CARE_PROVIDER_SITE_OTHER): Payer: Medicaid Other | Admitting: Obstetrics and Gynecology

## 2014-05-01 VITALS — BP 114/55 | HR 91 | Temp 98.8°F | Ht 61.0 in | Wt 209.6 lb

## 2014-05-01 DIAGNOSIS — E119 Type 2 diabetes mellitus without complications: Secondary | ICD-10-CM

## 2014-05-01 DIAGNOSIS — Z713 Dietary counseling and surveillance: Secondary | ICD-10-CM | POA: Diagnosis not present

## 2014-05-01 DIAGNOSIS — O24911 Unspecified diabetes mellitus in pregnancy, first trimester: Secondary | ICD-10-CM

## 2014-05-01 DIAGNOSIS — O34219 Maternal care for unspecified type scar from previous cesarean delivery: Secondary | ICD-10-CM | POA: Insufficient documentation

## 2014-05-01 DIAGNOSIS — IMO0002 Reserved for concepts with insufficient information to code with codable children: Secondary | ICD-10-CM

## 2014-05-01 DIAGNOSIS — O99210 Obesity complicating pregnancy, unspecified trimester: Secondary | ICD-10-CM | POA: Insufficient documentation

## 2014-05-01 DIAGNOSIS — O3421 Maternal care for scar from previous cesarean delivery: Secondary | ICD-10-CM

## 2014-05-01 DIAGNOSIS — Z369 Encounter for antenatal screening, unspecified: Secondary | ICD-10-CM

## 2014-05-01 DIAGNOSIS — O99211 Obesity complicating pregnancy, first trimester: Secondary | ICD-10-CM

## 2014-05-01 DIAGNOSIS — E669 Obesity, unspecified: Secondary | ICD-10-CM

## 2014-05-01 DIAGNOSIS — O0991 Supervision of high risk pregnancy, unspecified, first trimester: Secondary | ICD-10-CM

## 2014-05-01 DIAGNOSIS — E1165 Type 2 diabetes mellitus with hyperglycemia: Secondary | ICD-10-CM

## 2014-05-01 LAB — CBC
HCT: 35.2 % — ABNORMAL LOW (ref 36.0–46.0)
HEMOGLOBIN: 11.7 g/dL — AB (ref 12.0–15.0)
MCH: 27.3 pg (ref 26.0–34.0)
MCHC: 33.2 g/dL (ref 30.0–36.0)
MCV: 82.2 fL (ref 78.0–100.0)
MPV: 10 fL (ref 9.4–12.4)
PLATELETS: 334 10*3/uL (ref 150–400)
RBC: 4.28 MIL/uL (ref 3.87–5.11)
RDW: 15 % (ref 11.5–15.5)
WBC: 6.8 10*3/uL (ref 4.0–10.5)

## 2014-05-01 LAB — GLUCOSE, CAPILLARY: Glucose-Capillary: 138 mg/dL — ABNORMAL HIGH (ref 70–99)

## 2014-05-01 MED ORDER — ASPIRIN 81 MG PO TABS: 81.0000 mg | ORAL_TABLET | Freq: Every day | ORAL | Status: DC

## 2014-05-01 NOTE — Progress Notes (Signed)
First trimester screen 05/10/14 @ 115p with [facility].

## 2014-05-01 NOTE — Progress Notes (Signed)
DIABETES:Initial consult with [name] [name]. Taking medications as directed, testing glucose TID. Reviewed testing FBS and 2hpp all meals. She had a FBS this AM of 209mg /dl. Last night she had Strawberry yogurt and OJ. I have advised to discontinue any fruit juice during pregnancy. This AM she had 1/2 strawberry breakfast bar with a 2hpp reading of 138mg /dl. Dr. Jolayne Pantheronstant advised. Patient will return next week with full week of readings and medication regimen will be reevaluated. Will consult with Nutrition.

## 2014-05-01 NOTE — Patient Instructions (Signed)
First Trimester of Pregnancy The first trimester of pregnancy is from week 1 until the end of week 12 (months 1 through 3). A week after a sperm fertilizes an egg, the egg will implant on the wall of the uterus. This embryo will begin to develop into a baby. Genes from you and your partner are forming the baby. The female genes determine whether the baby is a boy or a girl. At 6-8 weeks, the eyes and face are formed, and the heartbeat can be seen on ultrasound. At the end of 12 weeks, all the baby's organs are formed.  Now that you are pregnant, you will want to do everything you can to have a healthy baby. Two of the most important things are to get good prenatal care and to follow your health care provider's instructions. Prenatal care is all the medical care you receive before the baby's birth. This care will help prevent, find, and treat any problems during the pregnancy and childbirth. BODY CHANGES Your body goes through many changes during pregnancy. The changes vary from woman to woman.   You may gain or lose a couple of pounds at first.  You may feel sick to your stomach (nauseous) and throw up (vomit). If the vomiting is uncontrollable, call your health care provider.  You may tire easily.  You may develop headaches that can be relieved by medicines approved by your health care provider.  You may urinate more often. Painful urination may mean you have a bladder infection.  You may develop heartburn as a result of your pregnancy.  You may develop constipation because certain hormones are causing the muscles that push waste through your intestines to slow down.  You may develop hemorrhoids or swollen, bulging veins (varicose veins).  Your breasts may begin to grow larger and become tender. Your nipples may stick out more, and the tissue that surrounds them (areola) may become darker.  Your gums may bleed and may be sensitive to brushing and flossing.  Dark spots or blotches  (chloasma, mask of pregnancy) may develop on your face. This will likely fade after the baby is born.  Your menstrual periods will stop.  You may have a loss of appetite.  You may develop cravings for certain kinds of food.  You may have changes in your emotions from day to day, such as being excited to be pregnant or being concerned that something may go wrong with the pregnancy and baby.  You may have more vivid and strange dreams.  You may have changes in your hair. These can include thickening of your hair, rapid growth, and changes in texture. Some women also have hair loss during or after pregnancy, or hair that feels dry or thin. Your hair will most likely return to normal after your baby is born. WHAT TO EXPECT AT YOUR PRENATAL VISITS During a routine prenatal visit:  You will be weighed to make sure you and the baby are growing normally.  Your blood pressure will be taken.  Your abdomen will be measured to track your baby's growth.  The fetal heartbeat will be listened to starting around week 10 or 12 of your pregnancy.  Test results from any previous visits will be discussed. Your health care provider may ask you:  How you are feeling.  If you are feeling the baby move.  If you have had any abnormal symptoms, such as leaking fluid, bleeding, severe headaches, or abdominal cramping.  If you have any questions. Other tests   that may be performed during your first trimester include:  Blood tests to find your blood type and to check for the presence of any previous infections. They will also be used to check for low iron levels (anemia) and Rh antibodies. Later in the pregnancy, blood tests for diabetes will be done along with other tests if problems develop.  Urine tests to check for infections, diabetes, or protein in the urine.  An ultrasound to confirm the proper growth and development of the baby.  An amniocentesis to check for possible genetic problems.  Fetal  screens for spina bifida and Down syndrome.  You may need other tests to make sure you and the baby are doing well. HOME CARE INSTRUCTIONS  Medicines  Follow your health care provider's instructions regarding medicine use. Specific medicines may be either safe or unsafe to take during pregnancy.  Take your prenatal vitamins as directed.  If you develop constipation, try taking a stool softener if your health care provider approves. Diet  Eat regular, well-balanced meals. Choose a variety of foods, such as meat or vegetable-based protein, fish, milk and low-fat dairy products, vegetables, fruits, and whole grain breads and cereals. Your health care provider will help you determine the amount of weight gain that is right for you.  Avoid raw meat and uncooked cheese. These carry germs that can cause birth defects in the baby.  Eating four or five small meals rather than three large meals a day may help relieve nausea and vomiting. If you start to feel nauseous, eating a few soda crackers can be helpful. Drinking liquids between meals instead of during meals also seems to help nausea and vomiting.  If you develop constipation, eat more high-fiber foods, such as fresh vegetables or fruit and whole grains. Drink enough fluids to keep your urine clear or pale yellow. Activity and Exercise  Exercise only as directed by your health care provider. Exercising will help you:  Control your weight.  Stay in shape.  Be prepared for labor and delivery.  Experiencing pain or cramping in the lower abdomen or low back is a good sign that you should stop exercising. Check with your health care provider before continuing normal exercises.  Try to avoid standing for long periods of time. Move your legs often if you must stand in one place for a long time.  Avoid heavy lifting.  Wear low-heeled shoes, and practice good posture.  You may continue to have sex unless your health care provider directs you  otherwise. Relief of Pain or Discomfort  Wear a good support bra for breast tenderness.   Take warm sitz baths to soothe any pain or discomfort caused by hemorrhoids. Use hemorrhoid cream if your health care provider approves.   Rest with your legs elevated if you have leg cramps or low back pain.  If you develop varicose veins in your legs, wear support hose. Elevate your feet for 15 minutes, 3-4 times a day. Limit salt in your diet. Prenatal Care  Schedule your prenatal visits by the twelfth week of pregnancy. They are usually scheduled monthly at first, then more often in the last 2 months before delivery.  Write down your questions. Take them to your prenatal visits.  Keep all your prenatal visits as directed by your health care provider. Safety  Wear your seat belt at all times when driving.  Make a list of emergency phone numbers, including numbers for family, friends, the hospital, and police and fire departments. General Tips    Ask your health care provider for a referral to a local prenatal education class. Begin classes no later than at the beginning of month 6 of your pregnancy.  Ask for help if you have counseling or nutritional needs during pregnancy. Your health care provider can offer advice or refer you to specialists for help with various needs.  Do not use hot tubs, steam rooms, or saunas.  Do not douche or use tampons or scented sanitary pads.  Do not cross your legs for long periods of time.  Avoid cat litter boxes and soil used by cats. These carry germs that can cause birth defects in the baby and possibly loss of the fetus by miscarriage or stillbirth.  Avoid all smoking, herbs, alcohol, and medicines not prescribed by your health care provider. Chemicals in these affect the formation and growth of the baby.  Schedule a dentist appointment. At home, brush your teeth with a soft toothbrush and be gentle when you floss. SEEK MEDICAL CARE IF:   You have  dizziness.  You have mild pelvic cramps, pelvic pressure, or nagging pain in the abdominal area.  You have persistent nausea, vomiting, or diarrhea.  You have a bad smelling vaginal discharge.  You have pain with urination.  You notice increased swelling in your face, hands, legs, or ankles. SEEK IMMEDIATE MEDICAL CARE IF:   You have a fever.  You are leaking fluid from your vagina.  You have spotting or bleeding from your vagina.  You have severe abdominal cramping or pain.  You have rapid weight gain or loss.  You vomit blood or material that looks like coffee grounds.  You are exposed to [place] measles and have never had them.  You are exposed to fifth disease or chickenpox.  You develop a severe headache.  You have shortness of breath.  You have any kind of trauma, such as from a fall or a car accident. Document Released: 04/22/2001 Document Revised: 09/12/2013 Document Reviewed: 03/08/2013 [facility] Patient Information 2015 [place], [place]. This information is not intended to replace advice given to you by your health care provider. Make sure you discuss any questions you have with your health care provider.  Group B Streptococcus Infection During Pregnancy Group B streptococcus (GBS) is a type of bacteria often found in healthy women. GBS is not the same as the bacteria that causes strep throat. You may have GBS in your vagina, rectum, or bladder. GBS does not spread through sexual contact, but it can be passed to a baby during childbirth. This can be dangerous for your baby. It is not dangerous to you and usually does not cause any symptoms. Your health care provider may test you for GBS when your pregnancy is between 35 and 37 weeks. GBS is dangerous only during birth, so there is no need to test for it earlier. It is possible to have GBS during pregnancy and never pass it to your baby. If your test results are positive for GBS, your health care provider may recommend  giving you antibiotic medicine during delivery to make sure your baby stays healthy. RISK FACTORS You are more likely to pass GBS to your baby if:   Your water breaks (ruptured membrane) or you go into labor before 37 weeks.  Your water breaks 18 hours before you deliver.  You passed GBS during a previous pregnancy.  You have a urinary tract infection caused by GBS any time during pregnancy.  You have a fever during labor. SYMPTOMS Most women  who have GBS do not have any symptoms. If you have a urinary tract infection caused by GBS, you might have frequent or painful urination and fever. Babies who get GBS usually show symptoms within 7 days of birth. Symptoms may include:   Breathing problems.  Heart and blood pressure problems.  Digestive and kidney problems. DIAGNOSIS Routine screening for GBS is recommended for all pregnant women. A health care provider takes a sample of the fluid in your vagina and rectum with a swab. It is then sent to a lab to be checked for GBS. A sample of your urine may also be checked for the bacteria.  TREATMENT If you test positive for GBS, you may need treatment with an antibiotic medicine during labor. As soon as you go into labor, or as soon as your membranes rupture, you will get the antibiotic medicine through an IV access. You will continue to get the medicine until after you give birth. You do not need antibiotic medicine if you are having a cesarean delivery.If your baby shows signs or symptoms of GBS after birth, your baby can also be treated with an antibiotic medicine. HOME CARE INSTRUCTIONS   Take all antibiotic medicine as prescribed by your health care provider. Only take medicine as directed.   Continue with prenatal visits and care.   Keep all follow-up appointments.  SEEK MEDICAL CARE IF:   You have pain when you urinate.   You have to urinate frequently.   You have a fever.  SEEK IMMEDIATE MEDICAL CARE IF:   Your  membranes rupture.  You go into labor. Document Released: 08/05/2007 Document Revised: 05/03/2013 Document Reviewed: 02/18/2013 [facility] Patient Information 2015 [place]. This information is not intended to replace advice given to you by your health care provider. Make sure you discuss any questions you have with your health care provider.  Contraception Choices Contraception (birth control) is the use of any methods or devices to prevent pregnancy. Below are some methods to help avoid pregnancy. HORMONAL METHODS   Contraceptive implant. This is a thin, plastic tube containing progesterone hormone. It does not contain estrogen hormone. Your health care provider inserts the tube in the inner part of the upper arm. The tube can remain in place for up to 3 years. After 3 years, the implant must be removed. The implant prevents the ovaries from releasing an egg (ovulation), thickens the cervical mucus to prevent sperm from entering the uterus, and thins the lining of the inside of the uterus.  Progesterone-only injections. These injections are given every 3 months by your health care provider to prevent pregnancy. This synthetic progesterone hormone stops the ovaries from releasing eggs. It also thickens cervical mucus and changes the uterine lining. This makes it harder for sperm to survive in the uterus.  Birth control pills. These pills contain estrogen and progesterone hormone. They work by preventing the ovaries from releasing eggs (ovulation). They also cause the cervical mucus to thicken, preventing the sperm from entering the uterus. Birth control pills are prescribed by a health care provider.Birth control pills can also be used to treat heavy periods.  Minipill. This type of birth control pill contains only the progesterone hormone. They are taken every day of each month and must be prescribed by your health care provider.  Birth control patch. The patch contains hormones similar to  those in birth control pills. It must be changed once a week and is prescribed by a health care provider.  Vaginal ring. The  ring contains hormones similar to those in birth control pills. It is left in the vagina for 3 weeks, removed for 1 week, and then a new one is put back in place. The patient must be comfortable inserting and removing the ring from the vagina.A health care provider's prescription is necessary.  Emergency contraception. Emergency contraceptives prevent pregnancy after unprotected sexual intercourse. This pill can be taken right after sex or up to 5 days after unprotected sex. It is most effective the sooner you take the pills after having sexual intercourse. Most emergency contraceptive pills are available without a prescription. Check with your pharmacist. Do not use emergency contraception as your only form of birth control. BARRIER METHODS   Female condom. This is a thin sheath (latex or rubber) that is worn over the penis during sexual intercourse. It can be used with spermicide to increase effectiveness.  Female condom. This is a soft, loose-fitting sheath that is put into the vagina before sexual intercourse.  Diaphragm. This is a soft, latex, dome-shaped barrier that must be fitted by a health care provider. It is inserted into the vagina, along with a spermicidal jelly. It is inserted before intercourse. The diaphragm should be left in the vagina for 6 to 8 hours after intercourse.  Cervical cap. This is a round, soft, latex or plastic cup that fits over the cervix and must be fitted by a health care provider. The cap can be left in place for up to 48 hours after intercourse.  Sponge. This is a soft, circular piece of polyurethane foam. The sponge has spermicide in it. It is inserted into the vagina after wetting it and before sexual intercourse.  Spermicides. These are chemicals that kill or block sperm from entering the cervix and uterus. They come in the form of  creams, jellies, suppositories, foam, or tablets. They do not require a prescription. They are inserted into the vagina with an applicator before having sexual intercourse. The process must be repeated every time you have sexual intercourse. INTRAUTERINE CONTRACEPTION  Intrauterine device (IUD). This is a T-shaped device that is put in a woman's uterus during a menstrual period to prevent pregnancy. There are 2 types:  Copper IUD. This type of IUD is wrapped in copper wire and is placed inside the uterus. Copper makes the uterus and fallopian tubes produce a fluid that kills sperm. It can stay in place for 10 years.  Hormone IUD. This type of IUD contains the hormone progestin (synthetic progesterone). The hormone thickens the cervical mucus and prevents sperm from entering the uterus, and it also thins the uterine lining to prevent implantation of a fertilized egg. The hormone can weaken or kill the sperm that get into the uterus. It can stay in place for 3-5 years, depending on which type of IUD is used. PERMANENT METHODS OF CONTRACEPTION  Female tubal ligation. This is when the woman's fallopian tubes are surgically sealed, tied, or blocked to prevent the egg from traveling to the uterus.  Hysteroscopic sterilization. This involves placing a small coil or insert into each fallopian tube. Your doctor uses a technique called hysteroscopy to do the procedure. The device causes scar tissue to form. This results in permanent blockage of the fallopian tubes, so the sperm cannot fertilize the egg. It takes about 3 months after the procedure for the tubes to become blocked. You must use another form of birth control for these 3 months.  Female sterilization. This is when the female has the tubes that  carry sperm tied off (vasectomy).This blocks sperm from entering the vagina during sexual intercourse. After the procedure, the man can still ejaculate fluid (semen). NATURAL PLANNING METHODS  Natural family  planning. This is not having sexual intercourse or using a barrier method (condom, diaphragm, cervical cap) on days the woman could become pregnant.  Calendar method. This is keeping track of the length of each menstrual cycle and identifying when you are fertile.  Ovulation method. This is avoiding sexual intercourse during ovulation.  Symptothermal method. This is avoiding sexual intercourse during ovulation, using a thermometer and ovulation symptoms.  Post-ovulation method. This is timing sexual intercourse after you have ovulated. Regardless of which type or method of contraception you choose, it is important that you use condoms to protect against the transmission of sexually transmitted infections (STIs). Talk with your health care provider about which form of contraception is most appropriate for you. Document Released: 04/28/2005 Document Revised: 05/03/2013 Document Reviewed: 10/21/2012 [facility] Patient Information 2015 [place], [place]. This information is not intended to replace advice given to you by your health care provider. Make sure you discuss any questions you have with your health care provider.  Breastfeeding Deciding to breastfeed is one of the best choices you can make for you and your baby. A change in hormones during pregnancy causes your breast tissue to grow and increases the number and size of your milk ducts. These hormones also allow proteins, sugars, and fats from your blood supply to make breast milk in your milk-producing glands. Hormones prevent breast milk from being released before your baby is born as well as prompt milk flow after birth. Once breastfeeding has begun, thoughts of your baby, as well as his or her sucking or crying, can stimulate the release of milk from your milk-producing glands.  BENEFITS OF BREASTFEEDING For Your Baby  Your first milk (colostrum) helps your baby's digestive system function better.   There are antibodies in your milk that  help your baby fight off infections.   Your baby has a lower incidence of asthma, allergies, and sudden infant death syndrome.   The nutrients in breast milk are better for your baby than infant formulas and are designed uniquely for your baby's needs.   Breast milk improves your baby's brain development.   Your baby is less likely to develop other conditions, such as childhood obesity, asthma, or type 2 diabetes mellitus.  For You   Breastfeeding helps to create a very special bond between you and your baby.   Breastfeeding is convenient. Breast milk is always available at the correct temperature and costs nothing.   Breastfeeding helps to burn calories and helps you lose the weight gained during pregnancy.   Breastfeeding makes your uterus contract to its prepregnancy size faster and slows bleeding (lochia) after you give birth.   Breastfeeding helps to lower your risk of developing type 2 diabetes mellitus, osteoporosis, and breast or ovarian cancer later in life. SIGNS THAT YOUR BABY IS HUNGRY Early Signs of Hunger  Increased alertness or activity.  Stretching.  Movement of the head from side to side.  Movement of the head and opening of the mouth when the corner of the mouth or cheek is stroked (rooting).  Increased sucking sounds, smacking lips, cooing, sighing, or squeaking.  Hand-to-mouth movements.  Increased sucking of fingers or hands. Late Signs of Hunger  Fussing.  Intermittent crying. Extreme Signs of Hunger Signs of extreme hunger will require calming and consoling before your baby will be able  to breastfeed successfully. Do not wait for the following signs of extreme hunger to occur before you initiate breastfeeding:   Restlessness.  A loud, strong cry.   Screaming. BREASTFEEDING BASICS Breastfeeding Initiation  Find a comfortable place to sit or lie down, with your neck and back well supported.  Place a pillow or rolled up blanket  under your baby to bring him or her to the level of your breast (if you are seated). Nursing pillows are specially designed to help support your arms and your baby while you breastfeed.  Make sure that your baby's abdomen is facing your abdomen.   Gently massage your breast. With your fingertips, massage from your chest wall toward your nipple in a circular motion. This encourages milk flow. You may need to continue this action during the feeding if your milk flows slowly.  Support your breast with 4 fingers underneath and your thumb above your nipple. Make sure your fingers are well away from your nipple and your baby's mouth.   Stroke your baby's lips gently with your finger or nipple.   When your baby's mouth is open wide enough, quickly bring your baby to your breast, placing your entire nipple and as much of the colored area around your nipple (areola) as possible into your baby's mouth.   More areola should be visible above your baby's upper lip than below the lower lip.   Your baby's tongue should be between his or her lower gum and your breast.   Ensure that your baby's mouth is correctly positioned around your nipple (latched). Your baby's lips should create a seal on your breast and be turned out (everted).  It is common for your baby to suck about 2-3 minutes in order to start the flow of breast milk. Latching Teaching your baby how to latch on to your breast properly is very important. An improper latch can cause nipple pain and decreased milk supply for you and poor weight gain in your baby. Also, if your baby is not latched onto your nipple properly, he or she may swallow some air during feeding. This can make your baby fussy. Burping your baby when you switch breasts during the feeding can help to get rid of the air. However, teaching your baby to latch on properly is still the best way to prevent fussiness from swallowing air while breastfeeding. Signs that your baby has  successfully latched on to your nipple:    Silent tugging or silent sucking, without causing you pain.   Swallowing heard between every 3-4 sucks.    Muscle movement above and in front of his or her ears while sucking.  Signs that your baby has not successfully latched on to nipple:   Sucking sounds or smacking sounds from your baby while breastfeeding.  Nipple pain. If you think your baby has not latched on correctly, slip your finger into the corner of your baby's mouth to break the suction and place it between your baby's gums. Attempt breastfeeding initiation again. Signs of Successful Breastfeeding Signs from your baby:   A gradual decrease in the number of sucks or complete cessation of sucking.   Falling asleep.   Relaxation of his or her body.   Retention of a small amount of milk in his or her mouth.   Letting go of your breast by himself or herself. Signs from you:  Breasts that have increased in firmness, weight, and size 1-3 hours after feeding.   Breasts that are softer immediately  after breastfeeding.  Increased milk volume, as well as a change in milk consistency and color by the fifth day of breastfeeding.   Nipples that are not sore, cracked, or bleeding. Signs That Your Pecola LeisureBaby is Getting Enough Milk  Wetting at least 3 diapers in a 24-hour period. The urine should be clear and pale yellow by age 3 days.  At least 3 stools in a 24-hour period by age 3 days. The stool should be soft and yellow.  At least 3 stools in a 24-hour period by age 97 days. The stool should be seedy and yellow.  No loss of weight greater than 10% of birth weight during the first 503 days of age.  Average weight gain of 4-7 ounces (113-198 g) per week after age 41 days.  Consistent daily weight gain by age 3 days, without weight loss after the age of 2 weeks. After a feeding, your baby may spit up a small amount. This is common. BREASTFEEDING FREQUENCY AND DURATION Frequent  feeding will help you make more milk and can prevent sore nipples and breast engorgement. Breastfeed when you feel the need to reduce the fullness of your breasts or when your baby shows signs of hunger. This is called "breastfeeding on demand." Avoid introducing a pacifier to your baby while you are working to establish breastfeeding (the first 4-6 weeks after your baby is born). After this time you may choose to use a pacifier. Research has shown that pacifier use during the first year of a baby's life decreases the risk of sudden infant death syndrome (SIDS). Allow your baby to feed on each breast as long as he or she wants. Breastfeed until your baby is finished feeding. When your baby unlatches or falls asleep while feeding from the first breast, offer the second breast. Because newborns are often sleepy in the first few weeks of life, you may need to awaken your baby to get him or her to feed. Breastfeeding times will vary from baby to baby. However, the following rules can serve as a guide to help you ensure that your baby is properly fed:  Newborns (babies 494 weeks of age or younger) may breastfeed every 1-3 hours.  Newborns should not go longer than 3 hours during the day or 5 hours during the night without breastfeeding.  You should breastfeed your baby a minimum of 8 times in a 24-hour period until you begin to introduce solid foods to your baby at around 576 months of age. BREAST MILK PUMPING Pumping and storing breast milk allows you to ensure that your baby is exclusively fed your breast milk, even at times when you are unable to breastfeed. This is especially important if you are going back to work while you are still breastfeeding or when you are not able to be present during feedings. Your lactation consultant can give you guidelines on how long it is safe to store breast milk.  A breast pump is a machine that allows you to pump milk from your breast into a sterile bottle. The pumped breast  milk can then be stored in a refrigerator or freezer. Some breast pumps are operated by hand, while others use electricity. Ask your lactation consultant which type will work best for you. Breast pumps can be purchased, but some hospitals and breastfeeding support groups lease breast pumps on a monthly basis. A lactation consultant can teach you how to hand express breast milk, if you prefer not to use a pump.  CARING FOR  YOUR BREASTS WHILE YOU BREASTFEED Nipples can become dry, cracked, and sore while breastfeeding. The following recommendations can help keep your breasts moisturized and healthy:  Avoid using soap on your nipples.   Wear a supportive bra. Although not required, special nursing bras and tank tops are designed to allow access to your breasts for breastfeeding without taking off your entire bra or top. Avoid wearing underwire-style bras or extremely tight bras.  Air dry your nipples for 3-31minutes after each feeding.   Use only cotton bra pads to absorb leaked breast milk. Leaking of breast milk between feedings is normal.   Use lanolin on your nipples after breastfeeding. Lanolin helps to maintain your skin's normal moisture barrier. If you use pure lanolin, you do not need to wash it off before feeding your baby again. Pure lanolin is not toxic to your baby. You may also hand express a few drops of breast milk and gently massage that milk into your nipples and allow the milk to air dry. In the first few weeks after giving birth, some women experience extremely full breasts (engorgement). Engorgement can make your breasts feel heavy, warm, and tender to the touch. Engorgement peaks within 3-5 days after you give birth. The following recommendations can help ease engorgement:  Completely empty your breasts while breastfeeding or pumping. You may want to start by applying warm, moist heat (in the shower or with warm water-soaked hand towels) just before feeding or pumping. This  increases circulation and helps the milk flow. If your baby does not completely empty your breasts while breastfeeding, pump any extra milk after he or she is finished.  Wear a snug bra (nursing or regular) or tank top for 1-2 days to signal your body to slightly decrease milk production.  Apply ice packs to your breasts, unless this is too uncomfortable for you.  Make sure that your baby is latched on and positioned properly while breastfeeding. If engorgement persists after 48 hours of following these recommendations, contact your health care provider or a Advertising copywriter. OVERALL HEALTH CARE RECOMMENDATIONS WHILE BREASTFEEDING  Eat healthy foods. Alternate between meals and snacks, eating 3 of each per day. Because what you eat affects your breast milk, some of the foods may make your baby more irritable than usual. Avoid eating these foods if you are sure that they are negatively affecting your baby.  Drink milk, fruit juice, and water to satisfy your thirst (about 10 glasses a day).   Rest often, relax, and continue to take your prenatal vitamins to prevent fatigue, stress, and anemia.  Continue breast self-awareness checks.  Avoid chewing and smoking tobacco.  Avoid alcohol and drug use. Some medicines that may be harmful to your baby can pass through breast milk. It is important to ask your health care provider before taking any medicine, including all over-the-counter and prescription medicine as well as vitamin and herbal supplements. It is possible to become pregnant while breastfeeding. If birth control is desired, ask your health care provider about options that will be safe for your baby. SEEK MEDICAL CARE IF:   You feel like you want to stop breastfeeding or have become frustrated with breastfeeding.  You have painful breasts or nipples.  Your nipples are cracked or bleeding.  Your breasts are red, tender, or warm.  You have a swollen area on either breast.  You  have a fever or chills.  You have nausea or vomiting.  You have drainage other than breast milk from your nipples.  Your breasts do not become full before feedings by the fifth day after you give birth.  You feel sad and depressed.  Your baby is too sleepy to eat well.  Your baby is having trouble sleeping.   Your baby is wetting less than 3 diapers in a 24-hour period.  Your baby has less than 3 stools in a 24-hour period.  Your baby's skin or the white part of his or her eyes becomes yellow.   Your baby is not gaining weight by 58 days of age. SEEK IMMEDIATE MEDICAL CARE IF:   Your baby is overly tired (lethargic) and does not want to wake up and feed.  Your baby develops an unexplained fever. Document Released: 04/28/2005 Document Revised: 05/03/2013 Document Reviewed: 10/20/2012 [facility] Patient Information 2015 [place], [place]. This information is not intended to replace advice given to you by your health care provider. Make sure you discuss any questions you have with your health care provider.  Trial of Labor After Cesarean Delivery Information A trial of labor after cesarean delivery (TOLAC) is when a woman tries to give birth vaginally after a previous cesarean delivery. TOLAC may be a safe and appropriate option for you depending on your medical history and other risk factors. When TOLAC is successful and you are able to have a vaginal delivery, this is called a vaginal birth after cesarean delivery (VBAC).  CANDIDATES FOR TOLAC TOLAC is possible for some women who:  Have undergone one or two prior cesarean deliveries in which the incision of the uterus was horizontal (low transverse).  Are carrying twins and have had one prior low transverse incision during a cesarean delivery.  Do not have a vertical (classical) uterine scar.  Have not had a tear in the wall of their uterus (uterine rupture). TOLAC is also supported for women who meet appropriate criteria  and:  Are under the age of 40 years.  Are tall and have a body mass index (BMI) of less than 30.  Have an unknown uterine scar.  Give birth in a facility equipped to handle an emergency cesarean delivery. This team should be able to handle possible complications such as a uterine rupture.  Have thorough counseling about the benefits and risks of TOLAC.  Have discussed future pregnancy plans with their health care provider.  Plan to have several more pregnancies. MOST SUCCESSFUL CANDIDATES FOR TOLAC:  Have had a successful vaginal delivery before or after their cesarean delivery.  Experience labor that begins naturally on or before the due date (40 weeks of gestation).  Do not have a very large (macrosomic) baby.   Had a prior cesarean delivery but are not currently experiencing factors that would prompt a cesarean delivery (such as a breech position).  Had only one prior cesarean delivery.  Had a prior cesarean delivery that was performed early in labor and not after full cervical dilation. TOLAC may be most appropriate for women who meet the above guidelines and who plan to have more pregnancies. TOLAC is not recommended for home births. LEAST SUCCESSFUL CANDIDATES FOR TOLAC:  Have an induced labor with an unfavorable cervix. An unfavorable cervix is when the cervix is not dilating enough (among other factors).  Have never had a vaginal delivery.  Have had more than two cesarean deliveries.  Have a pregnancy at more than 40 weeks of gestation.  Are pregnant with a baby with a suspected weight greater than 4,000 grams (8 pounds) and who have no prior history of a vaginal  delivery.  Have closely spaced pregnancies. SUGGESTED BENEFITS OF TOLAC  You may have a faster recovery time.  You may have a shorter stay in the hospital.  You may have less pain and fewer problems than with a cesarean delivery. Women who have a cesarean delivery have a higher chance of needing  blood or getting a fever, an infection, or a blood clot in the legs. SUGGESTED RISKS OF TOLAC The highest risk of complications happens to women who attempt a TOLAC and fail. A failed TOLAC results in an unplanned cesarean delivery. Risks related to [facility] or repeat cesarean deliveries include:   Blood loss.  Infection.  Blood clot.  Injury to surrounding tissues or organs.  Having to remove the uterus (hysterectomy).  Potential problems with the placenta (such as placenta previa or placenta accreta) in future pregnancies. Although very rare, the main concerns with TOLAC are:  Rupture of the uterine scar from a past cesarean delivery.  Needing an emergency cesarean delivery.  Having a bad outcome for the baby (perinatal morbidity). FOR MORE INFORMATION American Congress of Obstetricians and Gynecologists: www.acog.org [employer]: www.midwife.org Document Released: 01/14/2011 Document Revised: 02/16/2013 Document Reviewed: 10/18/2012 [facility] Patient Information 2015 [place], [place]. This information is not intended to replace advice given to you by your health care provider. Make sure you discuss any questions you have with your health care provider.

## 2014-05-01 NOTE — Progress Notes (Signed)
   Subjective:    [name] [name] is a [id number] being seen today for her first obstetrical visit.  Her obstetrical history is significant for group B strep colonizer, obesity and type 2 diabetes and previous cesarean section. Patient does not intend to breast feed. Pregnancy history fully reviewed. Patient did not bring meter or log book with her today. She reports highest fasting 209 and highest pp 106. Reviewed importance of euglycemia in pregnancy  Patient reports nausea.  Filed Vitals:   05/01/14 0928  BP: 114/55  Pulse: 91  Temp: 98.8 F (37.1 C)  Weight: 209 lb 9.6 oz (95.074 kg)    HISTORY: OB History  Gravida Para Term Preterm AB SAB TAB Ectopic Multiple Living  4 1 1  2 2    1     # Outcome Date GA Lbr Len/2nd Weight Sex Delivery Anes PTL Lv  4 Current           3 Term 01/30/11 7963w6d   M CS-LTranv EPI  Y  2 SAB 09/11/01 4579w0d   U    N  1 SAB              Past Medical History  Diagnosis Date  . Hypertension   . Herpes simplex without mention of complication   . Obesity   . Diabetes mellitus     type II  . Irregular menses   . Abnormal Pap smear   . GBS (group B streptococcus) UTI complicating pregnancy    Past Surgical History  Procedure Laterality Date  . Cesarean section  01/30/2011    Procedure: CESAREAN SECTION;  Surgeon: Tilda BurrowJohn V Ferguson, MD;  Location: [facility];  Service: Gynecology;  Laterality: N/A;   Family History  Problem Relation Age of Onset  . Diabetes Paternal Grandmother   . Hypertension Paternal Grandmother   . Diabetes Maternal Grandmother   . Diabetes Father   . Cancer Paternal Uncle      Exam   Patient declined   Assessment:    Pregnancy: [id number] Patient Active Problem List   Diagnosis Date Noted  . Obesity complicating pregnancy 05/01/2014    Priority: Medium  . Previous cesarean section complicating pregnancy 05/01/2014    Priority: Medium  . High-risk pregnancy in first trimester 04/17/2014    Priority: Medium  .  Diabetes mellitus affecting pregnancy 03/24/2014    Priority: Medium  . Type II diabetes mellitus, uncontrolled 09/20/2007    Priority: Medium  . Obesity 07/09/2006    Priority: Medium  . Abdominal pain affecting pregnancy 03/24/2014  . Alopecia areata 09/29/2013  . HERPES SIMPLEX 10/23/2006  . RHINITIS, ALLERGIC 07/09/2006  . PAPANICOLAOU SMEAR, ABNORMAL 07/09/2006        Plan:     Initial labs drawn. Prenatal vitamins. Problem list reviewed and updated. Genetic Screening discussed First Screen: ordered.  Ultrasound discussed; fetal survey: requested. Patient to meet with nutritionist, diabetic educator Baseline labs ordered HgA1C ordered Patient to bring log book and meter at next appointment Rx ASA 81 mg given  Follow up in 2 weeks. 50% of 30 min visit spent on counseling and coordination of care.     Tayvon Culley 05/01/2014

## 2014-05-01 NOTE — Progress Notes (Signed)
Nutrition note: 1st visit consult & DM diet education  Pt has had type 2 DM since age 89y. Pt has h/o obesity. Pt has gained 5.6# @ 579w6d, which is > expected. Pt reports eating 2 meals & 3 snacks/d as well as drinking ~3c of juice/d. Pt reports N/V throughout day.  Pt is taking a PNV. Pt reports she is active with her 3y old but no formal physical activity. Pt received verbal & written review of DM diet during pregnancy. Encouraged decreasing/ discontinuing juice. Discussed wt gain goals of 11-20# or 0.5#/wk in 2nd & 3rd trimester. Pt agrees to follow DM diet with 3 meals & 3 snacks/d with proper CHO/ protein combination. Pt does not have WIC but plans to apply. Pt plans to BF. F/u in 4-6 wks Blondell RevealLaura Daxton Nydam, MS, RD, LDN, [facility] Hospital[facility]CLC

## 2014-05-02 LAB — PRENATAL PROFILE (SOLSTAS)
Antibody Screen: NEGATIVE
Basophils Absolute: 0 10*3/uL (ref 0.0–0.1)
Basophils Relative: 0 % (ref 0–1)
EOS ABS: 0 10*3/uL (ref 0.0–0.7)
Eosinophils Relative: 0 % (ref 0–5)
HEMATOCRIT: 35.2 % — AB (ref 36.0–46.0)
HEMOGLOBIN: 11.7 g/dL — AB (ref 12.0–15.0)
HEP B S AG: NEGATIVE
HIV 1&2 Ab, 4th Generation: NONREACTIVE
Lymphocytes Relative: 24 % (ref 12–46)
Lymphs Abs: 1.6 10*3/uL (ref 0.7–4.0)
MCH: 27.3 pg (ref 26.0–34.0)
MCHC: 33.2 g/dL (ref 30.0–36.0)
MCV: 82.2 fL (ref 78.0–100.0)
MONOS PCT: 5 % (ref 3–12)
MPV: 10 fL (ref 9.4–12.4)
Monocytes Absolute: 0.3 10*3/uL (ref 0.1–1.0)
Neutro Abs: 4.8 10*3/uL (ref 1.7–7.7)
Neutrophils Relative %: 71 % (ref 43–77)
Platelets: 334 10*3/uL (ref 150–400)
RBC: 4.28 MIL/uL (ref 3.87–5.11)
RDW: 15 % (ref 11.5–15.5)
RUBELLA: 3.01 {index} — AB (ref <0.90)
Rh Type: NEGATIVE
WBC: 6.8 10*3/uL (ref 4.0–10.5)

## 2014-05-02 LAB — POCT URINALYSIS DIP (DEVICE)
Bilirubin Urine: NEGATIVE
GLUCOSE, UA: 100 mg/dL — AB
Hgb urine dipstick: NEGATIVE
KETONES UR: NEGATIVE mg/dL
LEUKOCYTES UA: NEGATIVE
NITRITE: NEGATIVE
PROTEIN: NEGATIVE mg/dL
Specific Gravity, Urine: 1.02 (ref 1.005–1.030)
Urobilinogen, UA: 0.2 mg/dL (ref 0.0–1.0)
pH: 7 (ref 5.0–8.0)

## 2014-05-02 LAB — PRESCRIPTION MONITORING PROFILE (19 PANEL)
AMPHETAMINE/METH: NEGATIVE ng/mL
BARBITURATE SCREEN, URINE: NEGATIVE ng/mL
BENZODIAZEPINE SCREEN, URINE: NEGATIVE ng/mL
Buprenorphine, Urine: NEGATIVE ng/mL
CARISOPRODOL, URINE: NEGATIVE ng/mL
Cannabinoid Scrn, Ur: NEGATIVE ng/mL
Cocaine Metabolites: NEGATIVE ng/mL
Creatinine, Urine: 98.47 mg/dL (ref >20.0)
Fentanyl, Ur: NEGATIVE ng/mL
MDMA URINE: NEGATIVE ng/mL
MEPERIDINE UR: NEGATIVE ng/mL
Methadone Screen, Urine: NEGATIVE ng/mL
Methaqualone: NEGATIVE ng/mL
NITRITES URINE, INITIAL: NEGATIVE ug/mL
Opiate Screen, Urine: NEGATIVE ng/mL
Oxycodone Screen, Ur: NEGATIVE ng/mL
PH URINE, INITIAL: 7.4 pH (ref 4.5–8.9)
PHENCYCLIDINE, UR: NEGATIVE ng/mL
Propoxyphene: NEGATIVE ng/mL
Tapentadol, urine: NEGATIVE ng/mL
Tramadol Scrn, Ur: NEGATIVE ng/mL
Zolpidem, Urine: NEGATIVE ng/mL

## 2014-05-02 LAB — TSH: TSH: 0.431 u[IU]/mL (ref 0.350–4.500)

## 2014-05-03 ENCOUNTER — Encounter: Payer: Self-pay | Admitting: Obstetrics and Gynecology

## 2014-05-03 DIAGNOSIS — O26899 Other specified pregnancy related conditions, unspecified trimester: Secondary | ICD-10-CM

## 2014-05-03 DIAGNOSIS — Z6791 Unspecified blood type, Rh negative: Secondary | ICD-10-CM | POA: Insufficient documentation

## 2014-05-04 LAB — HEMOGLOBIN A1C
HEMOGLOBIN A1C: 9.8 % — AB (ref <5.7)
Mean Plasma Glucose: 235 mg/dL — ABNORMAL HIGH (ref <117)

## 2014-05-10 ENCOUNTER — Encounter (HOSPITAL_COMMUNITY): Payer: Self-pay

## 2014-05-10 ENCOUNTER — Ambulatory Visit (HOSPITAL_COMMUNITY)
Admission: RE | Admit: 2014-05-10 | Discharge: 2014-05-10 | Disposition: A | Discharge status: Home / Self Care | Payer: Medicaid Other | Source: Ambulatory Visit | Attending: Obstetrics and Gynecology | Admitting: Obstetrics and Gynecology

## 2014-05-10 ENCOUNTER — Other Ambulatory Visit: Payer: Self-pay | Admitting: Family Medicine

## 2014-05-10 DIAGNOSIS — E119 Type 2 diabetes mellitus without complications: Secondary | ICD-10-CM | POA: Insufficient documentation

## 2014-05-10 DIAGNOSIS — IMO0002 Reserved for concepts with insufficient information to code with codable children: Secondary | ICD-10-CM

## 2014-05-10 DIAGNOSIS — O24111 Pre-existing diabetes mellitus, type 2, in pregnancy, first trimester: Secondary | ICD-10-CM | POA: Diagnosis not present

## 2014-05-10 DIAGNOSIS — O99211 Obesity complicating pregnancy, first trimester: Secondary | ICD-10-CM | POA: Insufficient documentation

## 2014-05-10 DIAGNOSIS — O09291 Supervision of pregnancy with other poor reproductive or obstetric history, first trimester: Secondary | ICD-10-CM | POA: Diagnosis not present

## 2014-05-10 DIAGNOSIS — Z369 Encounter for antenatal screening, unspecified: Secondary | ICD-10-CM

## 2014-05-10 DIAGNOSIS — Z3A13 13 weeks gestation of pregnancy: Secondary | ICD-10-CM | POA: Insufficient documentation

## 2014-05-10 DIAGNOSIS — O24912 Unspecified diabetes mellitus in pregnancy, second trimester: Secondary | ICD-10-CM

## 2014-05-10 DIAGNOSIS — Z3682 Encounter for antenatal screening for nuchal translucency: Secondary | ICD-10-CM | POA: Insufficient documentation

## 2014-05-10 DIAGNOSIS — Z0489 Encounter for examination and observation for other specified reasons: Secondary | ICD-10-CM

## 2014-05-10 DIAGNOSIS — O10919 Unspecified pre-existing hypertension complicating pregnancy, unspecified trimester: Secondary | ICD-10-CM

## 2014-05-15 ENCOUNTER — Ambulatory Visit (INDEPENDENT_AMBULATORY_CARE_PROVIDER_SITE_OTHER): Payer: Medicaid Other | Admitting: Family Medicine

## 2014-05-15 VITALS — BP 109/62 | HR 76 | Temp 98.5°F | Wt 205.1 lb

## 2014-05-15 DIAGNOSIS — E1165 Type 2 diabetes mellitus with hyperglycemia: Secondary | ICD-10-CM

## 2014-05-15 DIAGNOSIS — O0991 Supervision of high risk pregnancy, unspecified, first trimester: Secondary | ICD-10-CM

## 2014-05-15 DIAGNOSIS — IMO0002 Reserved for concepts with insufficient information to code with codable children: Secondary | ICD-10-CM

## 2014-05-15 LAB — COMPREHENSIVE METABOLIC PANEL
ALK PHOS: 87 U/L (ref 39–117)
ALT: 8 U/L (ref 0–35)
AST: 8 U/L (ref 0–37)
Albumin: 3.4 g/dL — ABNORMAL LOW (ref 3.5–5.2)
BILIRUBIN TOTAL: 0.3 mg/dL (ref 0.2–1.2)
BUN: 4 mg/dL — ABNORMAL LOW (ref 6–23)
CO2: 23 mEq/L (ref 19–32)
Calcium: 8.9 mg/dL (ref 8.4–10.5)
Chloride: 102 mEq/L (ref 96–112)
Creat: 0.53 mg/dL (ref 0.50–1.10)
GLUCOSE: 206 mg/dL — AB (ref 70–99)
Potassium: 4.4 mEq/L (ref 3.5–5.3)
SODIUM: 133 meq/L — AB (ref 135–145)
TOTAL PROTEIN: 6.4 g/dL (ref 6.0–8.3)

## 2014-05-15 LAB — POCT URINALYSIS DIP (DEVICE)
Bilirubin Urine: NEGATIVE
GLUCOSE, UA: 100 mg/dL — AB
Hgb urine dipstick: NEGATIVE
Ketones, ur: NEGATIVE mg/dL
NITRITE: NEGATIVE
Protein, ur: NEGATIVE mg/dL
SPECIFIC GRAVITY, URINE: 1.02 (ref 1.005–1.030)
Urobilinogen, UA: 0.2 mg/dL (ref 0.0–1.0)
pH: 7 (ref 5.0–8.0)

## 2014-05-15 MED ORDER — GLIMEPIRIDE 4 MG PO TABS: 6.0000 mg | ORAL_TABLET | Freq: Every day | ORAL | Status: DC

## 2014-05-15 NOTE — Progress Notes (Signed)
Fasting: most 90-110.  Isolated 205 2hr PP: 12/42 > 120 - with half in the 160-200 range Increase Amaryl to  qAM.   Discussed with patient that approaching maximum dose - may need to switch to insulin if not improving.

## 2014-05-15 NOTE — Patient Instructions (Signed)
Second Trimester of Pregnancy The second trimester is from week 13 through week 28, months 4 through 6. The second trimester is often a time when you feel your best. Your body has also adjusted to being pregnant, and you begin to feel better physically. Usually, morning sickness has lessened or quit completely, you may have more energy, and you may have an increase in appetite. The second trimester is also a time when the fetus is growing rapidly. At the end of the sixth month, the fetus is about 9 inches long and weighs about 1 pounds. You will likely begin to feel the baby move (quickening) between 18 and 20 weeks of the pregnancy. BODY CHANGES Your body goes through many changes during pregnancy. The changes vary from woman to woman.   Your weight will continue to increase. You will notice your lower abdomen bulging out.  You may begin to get stretch marks on your hips, abdomen, and breasts.  You may develop headaches that can be relieved by medicines approved by your health care provider.  You may urinate more often because the fetus is pressing on your bladder.  You may develop or continue to have heartburn as a result of your pregnancy.  You may develop constipation because certain hormones are causing the muscles that push waste through your intestines to slow down.  You may develop hemorrhoids or swollen, bulging veins (varicose veins).  You may have back pain because of the weight gain and pregnancy hormones relaxing your joints between the bones in your pelvis and as a result of a shift in weight and the muscles that support your balance.  Your breasts will continue to grow and be tender.  Your gums may bleed and may be sensitive to brushing and flossing.  Dark spots or blotches (chloasma, mask of pregnancy) may develop on your face. This will likely fade after the baby is born.  A dark line from your belly button to the pubic area (linea nigra) may appear. This will likely fade  after the baby is born.  You may have changes in your hair. These can include thickening of your hair, rapid growth, and changes in texture. Some women also have hair loss during or after pregnancy, or hair that feels dry or thin. Your hair will most likely return to normal after your baby is born. WHAT TO EXPECT AT YOUR PRENATAL VISITS During a routine prenatal visit:  You will be weighed to make sure you and the fetus are growing normally.  Your blood pressure will be taken.  Your abdomen will be measured to track your baby's growth.  The fetal heartbeat will be listened to.  Any test results from the previous visit will be discussed. Your health care provider may ask you:  How you are feeling.  If you are feeling the baby move.  If you have had any abnormal symptoms, such as leaking fluid, bleeding, severe headaches, or abdominal cramping.  If you have any questions. Other tests that may be performed during your second trimester include:  Blood tests that check for:  Low iron levels (anemia).  Gestational diabetes (between 24 and 28 weeks).  Rh antibodies.  Urine tests to check for infections, diabetes, or protein in the urine.  An ultrasound to confirm the proper growth and development of the baby.  An amniocentesis to check for possible genetic problems.  Fetal screens for spina bifida and Down syndrome. HOME CARE INSTRUCTIONS   Avoid all smoking, herbs, alcohol, and unprescribed   drugs. These chemicals affect the formation and growth of the baby.  Follow your health care provider's instructions regarding medicine use. There are medicines that are either safe or unsafe to take during pregnancy.  Exercise only as directed by your health care provider. Experiencing uterine cramps is a good sign to stop exercising.  Continue to eat regular, healthy meals.  Wear a good support bra for breast tenderness.  Do not use hot tubs, steam rooms, or saunas.  Wear your  seat belt at all times when driving.  Avoid raw meat, uncooked cheese, cat litter boxes, and soil used by cats. These carry germs that can cause birth defects in the baby.  Take your prenatal vitamins.  Try taking a stool softener (if your health care provider approves) if you develop constipation. Eat more high-fiber foods, such as fresh vegetables or fruit and whole grains. Drink plenty of fluids to keep your urine clear or pale yellow.  Take warm sitz baths to soothe any pain or discomfort caused by hemorrhoids. Use hemorrhoid cream if your health care provider approves.  If you develop varicose veins, wear support hose. Elevate your feet for 15 minutes, 3-4 times a day. Limit salt in your diet.  Avoid heavy lifting, wear low heel shoes, and practice good posture.  Rest with your legs elevated if you have leg cramps or low back pain.  Visit your dentist if you have not gone yet during your pregnancy. Use a soft toothbrush to brush your teeth and be gentle when you floss.  A sexual relationship may be continued unless your health care provider directs you otherwise.  Continue to go to all your prenatal visits as directed by your health care provider. SEEK MEDICAL CARE IF:   You have dizziness.  You have mild pelvic cramps, pelvic pressure, or nagging pain in the abdominal area.  You have persistent nausea, vomiting, or diarrhea.  You have a bad smelling vaginal discharge.  You have pain with urination. SEEK IMMEDIATE MEDICAL CARE IF:   You have a fever.  You are leaking fluid from your vagina.  You have spotting or bleeding from your vagina.  You have severe abdominal cramping or pain.  You have rapid weight gain or loss.  You have shortness of breath with chest pain.  You notice sudden or extreme swelling of your face, hands, ankles, feet, or legs.  You have not felt your baby move in over an hour.  You have severe headaches that do not go away with  medicine.  You have vision changes. Document Released: 04/22/2001 Document Revised: 05/03/2013 Document Reviewed: 06/29/2012 [employer] Patient Information 2015 [employer]. This information is not intended to replace advice given to you by your health care provider. Make sure you discuss any questions you have with your health care provider.  

## 2014-05-16 LAB — PROTEIN, URINE, 24 HOUR
Protein, 24H Urine: 247 mg/d — ABNORMAL HIGH (ref <150)
Protein, Urine: 29 mg/dL — ABNORMAL HIGH (ref 5–24)

## 2014-05-18 ENCOUNTER — Telehealth: Payer: Self-pay | Admitting: Family Medicine

## 2014-05-18 NOTE — Telephone Encounter (Signed)
Dropped off DSS form to be completed by Dr Adriana Simasook [facility] Would like to have [facility] completed by tomorrow Told her we require 14 days for form completion

## 2014-05-18 NOTE — Telephone Encounter (Signed)
Placed form in PCP box for completion

## 2014-05-22 ENCOUNTER — Telehealth: Payer: Self-pay | Admitting: General Practice

## 2014-05-22 ENCOUNTER — Encounter: Payer: Self-pay | Admitting: *Deleted

## 2014-05-22 ENCOUNTER — Other Ambulatory Visit (HOSPITAL_COMMUNITY): Payer: Self-pay

## 2014-05-22 NOTE — Telephone Encounter (Signed)
Patient called in to front office stating she had a runny rose and was congested this morning and is pregnant and wants to know what she can take. Reviewed approved cough/cold medications with pregnancy and to avoid phenylephrine. Patient verbalized understanding and had no questions

## 2014-05-26 ENCOUNTER — Emergency Department (HOSPITAL_COMMUNITY): Payer: Medicaid Other

## 2014-05-26 ENCOUNTER — Encounter (HOSPITAL_COMMUNITY): Payer: Self-pay | Admitting: Emergency Medicine

## 2014-05-26 ENCOUNTER — Emergency Department (HOSPITAL_COMMUNITY)
Admission: EM | Admit: 2014-05-26 | Discharge: 2014-05-26 | Disposition: A | Discharge status: Home / Self Care | Payer: Medicaid Other | Attending: Emergency Medicine | Admitting: Emergency Medicine

## 2014-05-26 DIAGNOSIS — O10011 Pre-existing essential hypertension complicating pregnancy, first trimester: Secondary | ICD-10-CM | POA: Insufficient documentation

## 2014-05-26 DIAGNOSIS — O99211 Obesity complicating pregnancy, first trimester: Secondary | ICD-10-CM | POA: Insufficient documentation

## 2014-05-26 DIAGNOSIS — R630 Anorexia: Secondary | ICD-10-CM | POA: Insufficient documentation

## 2014-05-26 DIAGNOSIS — Z3A15 15 weeks gestation of pregnancy: Secondary | ICD-10-CM | POA: Diagnosis not present

## 2014-05-26 DIAGNOSIS — O24111 Pre-existing diabetes mellitus, type 2, in pregnancy, first trimester: Secondary | ICD-10-CM | POA: Diagnosis not present

## 2014-05-26 DIAGNOSIS — O9989 Other specified diseases and conditions complicating pregnancy, childbirth and the puerperium: Secondary | ICD-10-CM | POA: Diagnosis not present

## 2014-05-26 DIAGNOSIS — Z8619 Personal history of other infectious and parasitic diseases: Secondary | ICD-10-CM | POA: Diagnosis not present

## 2014-05-26 DIAGNOSIS — Z79899 Other long term (current) drug therapy: Secondary | ICD-10-CM | POA: Diagnosis not present

## 2014-05-26 DIAGNOSIS — E669 Obesity, unspecified: Secondary | ICD-10-CM | POA: Diagnosis not present

## 2014-05-26 DIAGNOSIS — J069 Acute upper respiratory infection, unspecified: Secondary | ICD-10-CM | POA: Diagnosis not present

## 2014-05-26 DIAGNOSIS — B9789 Other viral agents as the cause of diseases classified elsewhere: Secondary | ICD-10-CM

## 2014-05-26 DIAGNOSIS — E119 Type 2 diabetes mellitus without complications: Secondary | ICD-10-CM | POA: Insufficient documentation

## 2014-05-26 DIAGNOSIS — O99511 Diseases of the respiratory system complicating pregnancy, first trimester: Secondary | ICD-10-CM | POA: Insufficient documentation

## 2014-05-26 DIAGNOSIS — R05 Cough: Secondary | ICD-10-CM

## 2014-05-26 DIAGNOSIS — R059 Cough, unspecified: Secondary | ICD-10-CM

## 2014-05-26 LAB — CBC WITH DIFFERENTIAL/PLATELET
Basophils Absolute: 0 10*3/uL (ref 0.0–0.1)
Basophils Relative: 0 % (ref 0–1)
Eosinophils Absolute: 0.1 10*3/uL (ref 0.0–0.7)
Eosinophils Relative: 1 % (ref 0–5)
HCT: 35.5 % — ABNORMAL LOW (ref 36.0–46.0)
HEMOGLOBIN: 11.9 g/dL — AB (ref 12.0–15.0)
Lymphocytes Relative: 22 % (ref 12–46)
Lymphs Abs: 1.9 10*3/uL (ref 0.7–4.0)
MCH: 27.4 pg (ref 26.0–34.0)
MCHC: 33.5 g/dL (ref 30.0–36.0)
MCV: 81.8 fL (ref 78.0–100.0)
Monocytes Absolute: 0.4 10*3/uL (ref 0.1–1.0)
Monocytes Relative: 4 % (ref 3–12)
Neutro Abs: 6.5 10*3/uL (ref 1.7–7.7)
Neutrophils Relative %: 73 % (ref 43–77)
PLATELETS: 351 10*3/uL (ref 150–400)
RBC: 4.34 MIL/uL (ref 3.87–5.11)
RDW: 13.9 % (ref 11.5–15.5)
WBC: 8.9 10*3/uL (ref 4.0–10.5)

## 2014-05-26 LAB — BASIC METABOLIC PANEL
Anion gap: 11 (ref 5–15)
BUN: <5 mg/dL — ABNORMAL LOW (ref 6–23)
CALCIUM: 9.2 mg/dL (ref 8.4–10.5)
CO2: 19 mmol/L (ref 19–32)
CREATININE: 0.43 mg/dL — AB (ref 0.50–1.10)
Chloride: 101 mEq/L (ref 96–112)
GFR calc Af Amer: >90 mL/min (ref >90)
GFR calc non Af Amer: >90 mL/min (ref >90)
Glucose, Bld: 176 mg/dL — ABNORMAL HIGH (ref 70–99)
Potassium: 3.7 mmol/L (ref 3.5–5.1)
SODIUM: 131 mmol/L — AB (ref 135–145)

## 2014-05-26 MED ORDER — ALBUTEROL SULFATE HFA 108 (90 BASE) MCG/ACT IN AERS: 1.0000 | INHALATION_SPRAY | Freq: Four times a day (QID) | RESPIRATORY_TRACT | Status: DC | PRN

## 2014-05-26 MED ORDER — ACETAMINOPHEN 500 MG PO TABS
1000.0000 mg | ORAL_TABLET | Freq: Once | ORAL | Status: AC
  Administered 2014-05-26: 1000 mg via ORAL
  Filled 2014-05-26: qty 2

## 2014-05-26 MED ORDER — SODIUM CHLORIDE 0.9 % IV BOLUS (SEPSIS)
500.0000 mL | Freq: Once | INTRAVENOUS | Status: AC
  Administered 2014-05-26: 500 mL via INTRAVENOUS

## 2014-05-26 NOTE — ED Notes (Signed)
IV start unsuccessful x 1

## 2014-05-26 NOTE — ED Provider Notes (Signed)
CSN: [id number]     Arrival date & time 05/26/14  1009 History  This chart was scribed for non-physician practitioner, Cherylann Parr, PA-C, working with Jasper Riling. Alvino Chapel, MD by Ladene Artist, ED Scribe. This patient was seen in room TR07C/TR07C and the patient's care was started at 10:49 AM.   Chief Complaint  Patient presents with  . URI   The history is provided by the patient. No language interpreter was used.   HPI Comments: [name] [name] is a 29 y.o. female, 3 months pregnant with a h/o HTN, DM, who presents to the Emergency Department complaining of persistent productive cough with white/yellow colored sputum over the past 4 days. Pt reports associated congestion, chest pain only with cough, HA, loss of appetite, chills. She denies fever, nausea, vomiting, chest pain, wheezing, abdominal pain, vaginal bleeding, dysuria, urinary frequency, urinary urgency. Pt has been treating with Tylenol. No known allergies.   PCP: Thersa Salt, DO GYN: [facility] Past Medical History  Diagnosis Date  . Hypertension   . Herpes simplex without mention of complication   . Obesity   . Diabetes mellitus     type II  . Irregular menses   . Abnormal Pap smear   . GBS (group B streptococcus) UTI complicating pregnancy    Past Surgical History  Procedure Laterality Date  . Cesarean section  01/30/2011    Procedure: CESAREAN SECTION;  Surgeon: [name], MD;  Location: [facility];  Service: Gynecology;  Laterality: N/A;   Family History  Problem Relation Age of Onset  . Diabetes Paternal Grandmother   . Hypertension Paternal Grandmother   . Diabetes Maternal Grandmother   . Diabetes Father   . Cancer Paternal Uncle    History  Substance Use Topics  . Smoking status: Never Smoker   . Smokeless tobacco: Never Used  . Alcohol Use: No   OB History    Gravida Para Term Preterm AB TAB SAB Ectopic Multiple Living   4 1 1  2  2   1      Review of Systems  Constitutional:  Positive for chills. Negative for fever.  HENT: Positive for congestion.   Respiratory: Positive for cough. Negative for wheezing.   Cardiovascular: Negative for chest pain.  Gastrointestinal: Negative for nausea, vomiting and abdominal pain.  Genitourinary: Negative for dysuria, urgency, frequency and vaginal bleeding.  Neurological: Positive for headaches.  All other systems reviewed and are negative.  Allergies  Review of patient's allergies indicates no known allergies.  Home Medications   Prior to Admission medications   Medication Sig Start Date End Date Taking? Authorizing Provider  ACCU-CHEK FASTCLIX LANCETS MISC Check sugar up to 6 x daily 08/02/13   Coral Spikes, DO  acetaminophen (TYLENOL) 325 MG tablet Take 325 mg by mouth every 6 (six) hours as needed for mild pain or headache.    Historical Provider, MD  albuterol (PROVENTIL HFA;VENTOLIN HFA) 108 (90 BASE) MCG/ACT inhaler Inhale 1-2 puffs into the lungs every 6 (six) hours as needed for wheezing or shortness of breath. 05/26/14   Mathis Cashman A Forcucci, PA-C  Blood Glucose Monitoring Suppl (ACCU-CHEK NANO SMARTVIEW) W/DEVICE KIT Use up to 6 times daily to test blood sugars 08/02/13   Coral Spikes, DO  Doxylamine-Pyridoxine 10-10 MG TBEC Take 2 each by mouth at bedtime. 04/17/14   Timmothy Euler, MD  glimepiride (AMARYL) 4 MG tablet Take 1.5 tablets (6 mg total) by mouth daily before breakfast. 05/15/14   Edison Nasuti  J Stinson, DO  glucose blood (ACCU-CHEK SMARTVIEW) test strip Check sugar 6 x daily 08/02/13   Coral Spikes, DO  prenatal vitamin w/FE, FA (PRENATAL 1 + 1) 27-1 MG TABS tablet Take 1 tablet by mouth daily at 12 noon.    Historical Provider, MD  sitaGLIPtin (JANUVIA) 100 MG tablet Take 1 tablet (100 mg total) by mouth daily. 08/02/13   Coral Spikes, DO   Triage Vitals: BP 128/60 mmHg  Pulse 112  Temp(Src) 98.6 F (37 C) (Oral)  Resp 18  Ht 5' 1"  (1.549 m)  Wt 202 lb (91.627 kg)  BMI 38.19 kg/m2  SpO2 99%  LMP  02/23/2014 Physical Exam  Constitutional: She is oriented to person, place, and time. She appears well-developed and well-nourished. No distress.  HENT:  Head: Normocephalic and atraumatic.  Right Ear: External ear normal.  Left Ear: External ear normal.  Nose: Mucosal edema present.  Mouth/Throat: Posterior oropharyngeal erythema (mild) present. No oropharyngeal exudate.  Eyes: Conjunctivae and EOM are normal.  Neck: Neck supple.  Cardiovascular: Regular rhythm, normal heart sounds and intact distal pulses.  Tachycardia present.  Exam reveals no gallop and no friction rub.   No murmur heard. Pulmonary/Chest: Effort normal and breath sounds normal. No respiratory distress. She has no wheezes. She has no rales. She exhibits no tenderness.  Abdominal: Soft. Bowel sounds are normal. She exhibits no distension and no mass. There is no tenderness. There is no rebound and no guarding.  Musculoskeletal: Normal range of motion.  Neurological: She is alert and oriented to person, place, and time.  Skin: Skin is warm and dry.  Psychiatric: She has a normal mood and affect. Her behavior is normal.  Nursing note and vitals reviewed.  ED Course  Procedures (including critical care time) DIAGNOSTIC STUDIES: Oxygen Saturation is 99% on RA, normal by my interpretation.    COORDINATION OF CARE: 10:57 AM-Discussed treatment plan which includes IV fluids and Tylenol with pt at bedside and pt agreed to plan.   Labs Review Labs Reviewed  CBC WITH DIFFERENTIAL - Abnormal; Notable for the following:    Hemoglobin 11.9 (*)    HCT 35.5 (*)    All other components within normal limits  BASIC METABOLIC PANEL - Abnormal; Notable for the following:    Sodium 131 (*)    Glucose, Bld 176 (*)    BUN <5 (*)    Creatinine, Ser 0.43 (*)    All other components within normal limits   Imaging Review Dg Chest 2 View  05/26/2014   CLINICAL DATA:  Initial encounter for 1 week history of dry cough  EXAM: CHEST  2  VIEW  COMPARISON:  10/23/2012  FINDINGS: Lung volumes are low bilaterally. The lungs are clear without focal infiltrate, edema, pneumothorax or pleural effusion. The cardiopericardial silhouette is within normal limits for size. Imaged bony structures of the thorax are intact.  IMPRESSION: Low lung volumes.  Otherwise normal exam.   Electronically Signed   By: Misty Stanley M.D.   On: 05/26/2014 11:45    EKG Interpretation None      MDM   Final diagnoses:  Cough  Viral URI with cough   Patient is a 29 year old pregnant female who presents emergency room for evaluation of upper respiratory complaints. Physical exam reveals no acute abnormalities other than tachycardia. Chest x-ray reveals low lung volumes and they're otherwise normal exam. No acute consolidations. No evidence of pneumonia. CBC reveals mild anemia is otherwise unremarkable. BMP reveals mild  hyponatremia which is likely related to dehydration as patient states she has not been eating or drinking well. Tachycardia has resolved after 500 mL normal saline bolus. Suspect that this is a viral upper respiratory infection. Will treat with nasal saline or in any pot, Benadryl for congestion, and Tylenol as needed for pain. I discussed patient with Dr. Alvino Chapel who agrees with the above workup and plan. Patient stable for discharge at this time. Patient return for worsening shortness of breath, fevers, or any other concerning symptoms. She states understanding and agreement at this time.  I personally performed the services described in this documentation, which was scribed in my presence. The recorded information has been reviewed and is accurate.    Cherylann Parr, PA-C 05/26/14 [place] Alvino Chapel, MD 05/26/14 [phone]

## 2014-05-26 NOTE — ED Notes (Signed)
Waiting for d/c instructions to be signed by provider.

## 2014-05-26 NOTE — Discharge Instructions (Signed)
Upper Respiratory Infection, Adult An upper respiratory infection (URI) is also sometimes known as the common cold. The upper respiratory tract includes the nose, sinuses, throat, trachea, and bronchi. Bronchi are the airways leading to the lungs. Most people improve within 1 week, but symptoms can last up to 2 weeks. A residual cough may last even longer.  CAUSES Many different viruses can infect the tissues lining the upper respiratory tract. The tissues become irritated and inflamed and often become very moist. Mucus production is also common. A cold is contagious. You can easily spread the virus to others by oral contact. This includes kissing, sharing a glass, coughing, or sneezing. Touching your mouth or nose and then touching a surface, which is then touched by another person, can also spread the virus. SYMPTOMS  Symptoms typically develop 1 to 3 days after you come in contact with a cold virus. Symptoms vary from person to person. They may include:  Runny nose.  Sneezing.  Nasal congestion.  Sinus irritation.  Sore throat.  Loss of voice (laryngitis).  Cough.  Fatigue.  Muscle aches.  Loss of appetite.  Headache.  Low-grade fever. DIAGNOSIS  You might diagnose your own cold based on familiar symptoms, since most people get a cold 2 to 3 times a year. Your caregiver can confirm this based on your exam. Most importantly, your caregiver can check that your symptoms are not due to another disease such as strep throat, sinusitis, pneumonia, asthma, or epiglottitis. Blood tests, throat tests, and X-rays are not necessary to diagnose a common cold, but they may sometimes be helpful in excluding other more serious diseases. Your caregiver will decide if any further tests are required. RISKS AND COMPLICATIONS  You may be at risk for a more severe case of the common cold if you smoke cigarettes, have chronic heart disease (such as heart failure) or lung disease (such as asthma), or if  you have a weakened immune system. The very young and very old are also at risk for more serious infections. Bacterial sinusitis, middle ear infections, and bacterial pneumonia can complicate the common cold. The common cold can worsen asthma and chronic obstructive pulmonary disease (COPD). Sometimes, these complications can require emergency medical care and may be life-threatening. PREVENTION  The best way to protect against getting a cold is to practice good hygiene. Avoid oral or hand contact with people with cold symptoms. Wash your hands often if contact occurs. There is no clear evidence that vitamin C, vitamin E, echinacea, or exercise reduces the chance of developing a cold. However, it is always recommended to get plenty of rest and practice good nutrition. TREATMENT  Treatment is directed at relieving symptoms. There is no cure. Antibiotics are not effective, because the infection is caused by a virus, not by bacteria. Treatment may include:  Increased fluid intake. Sports drinks offer valuable electrolytes, sugars, and fluids.  Breathing heated mist or steam (vaporizer or shower).  Eating chicken soup or other clear broths, and maintaining good nutrition.  Getting plenty of rest.  Using gargles or lozenges for comfort.  Controlling fevers with ibuprofen or acetaminophen as directed by your caregiver.  Increasing usage of your inhaler if you have asthma. Zinc gel and zinc lozenges, taken in the first 24 hours of the common cold, can shorten the duration and lessen the severity of symptoms. Pain medicines may help with fever, muscle aches, and throat pain. A variety of non-prescription medicines are available to treat congestion and runny nose. Your caregiver   can make recommendations and may suggest nasal or lung inhalers for other symptoms.  HOME CARE INSTRUCTIONS   Only take over-the-counter or prescription medicines for pain, discomfort, or fever as directed by your  caregiver.  Use a warm mist humidifier or inhale steam from a shower to increase air moisture. This may keep secretions moist and make it easier to breathe.  Drink enough water and fluids to keep your urine clear or pale yellow.  Rest as needed.  Return to work when your temperature has returned to normal or as your caregiver advises. You may need to stay home longer to avoid infecting others. You can also use a face mask and careful hand washing to prevent spread of the virus. SEEK MEDICAL CARE IF:   After the first few days, you feel you are getting worse rather than better.  You need your caregiver's advice about medicines to control symptoms.  You develop chills, worsening shortness of breath, or brown or red sputum. These may be signs of pneumonia.  You develop yellow or brown nasal discharge or pain in the face, especially when you bend forward. These may be signs of sinusitis.  You develop a fever, swollen neck glands, pain with swallowing, or white areas in the back of your throat. These may be signs of strep throat. SEEK IMMEDIATE MEDICAL CARE IF:   You have a fever.  You develop severe or persistent headache, ear pain, sinus pain, or chest pain.  You develop wheezing, a prolonged cough, cough up blood, or have a change in your usual mucus (if you have chronic lung disease).  You develop sore muscles or a stiff neck. Document Released: 10/22/2000 Document Revised: 07/21/2011 Document Reviewed: 08/03/2013 ExitCare Patient Information 2015 [employer]. This information is not intended to replace advice given to you by your health care provider. Make sure you discuss any questions you have with your health care provider.  

## 2014-05-26 NOTE — ED Notes (Signed)
Patient states she is 3 months pregnant and has cough/congestion x 1 week.  Patient states everything hurts when she coughs.   Patient states congested cough, but not productive.

## 2014-05-29 ENCOUNTER — Ambulatory Visit (INDEPENDENT_AMBULATORY_CARE_PROVIDER_SITE_OTHER): Payer: Medicaid Other | Admitting: Obstetrics & Gynecology

## 2014-05-29 VITALS — BP 123/60 | HR 91 | Temp 98.4°F | Wt 203.1 lb

## 2014-05-29 DIAGNOSIS — O24912 Unspecified diabetes mellitus in pregnancy, second trimester: Secondary | ICD-10-CM

## 2014-05-29 DIAGNOSIS — Z36 Encounter for antenatal screening of mother: Secondary | ICD-10-CM

## 2014-05-29 DIAGNOSIS — E119 Type 2 diabetes mellitus without complications: Secondary | ICD-10-CM

## 2014-05-29 DIAGNOSIS — Z3682 Encounter for antenatal screening for nuchal translucency: Secondary | ICD-10-CM

## 2014-05-29 LAB — POCT URINALYSIS DIP (DEVICE)
Bilirubin Urine: NEGATIVE
Glucose, UA: 100 mg/dL — AB
Hgb urine dipstick: NEGATIVE
KETONES UR: 15 mg/dL — AB
Leukocytes, UA: NEGATIVE
NITRITE: NEGATIVE
PH: 7 (ref 5.0–8.0)
Protein, ur: NEGATIVE mg/dL
Specific Gravity, Urine: 1.02 (ref 1.005–1.030)
Urobilinogen, UA: 0.2 mg/dL (ref 0.0–1.0)

## 2014-05-29 MED ORDER — GLIMEPIRIDE 4 MG PO TABS: 6.0000 mg | ORAL_TABLET | Freq: Every day | ORAL | Status: DC

## 2014-05-29 MED ORDER — SITAGLIPTIN PHOSPHATE 100 MG PO TABS: 100.0000 mg | ORAL_TABLET | Freq: Every day | ORAL | Status: DC

## 2014-05-29 MED ORDER — ASPIRIN EC 81 MG PO TBEC: 81.0000 mg | DELAYED_RELEASE_TABLET | Freq: Every day | ORAL | Status: DC

## 2014-05-29 NOTE — Patient Instructions (Signed)
Return to clinic for any obstetric concerns or go to [facility] for evaluation  

## 2014-05-29 NOTE — Progress Notes (Signed)
No blood sugar log, reports fasting this morning of 162. No meds for 4 days.  Told to call clinic if she ever runs out of meds, medications refilled. Aspirin prescribed for preeclampsia prevention Anatomy scan scheduled. No other complaints or concerns.  Routine obstetric precautions reviewed. Will reevaluate sugars in one week.

## 2014-05-29 NOTE — Progress Notes (Signed)
States ran out of junuvia and glimepiride- having a problem with medicaid- has been calling her Financial controllerworker. States she has been trying to regulate what she eats to help with blood sugar.

## 2014-06-05 ENCOUNTER — Ambulatory Visit (INDEPENDENT_AMBULATORY_CARE_PROVIDER_SITE_OTHER): Payer: Self-pay | Admitting: Obstetrics & Gynecology

## 2014-06-05 ENCOUNTER — Encounter: Payer: Self-pay | Admitting: Obstetrics & Gynecology

## 2014-06-05 VITALS — BP 120/59 | HR 82 | Temp 98.9°F | Wt 206.6 lb

## 2014-06-05 DIAGNOSIS — IMO0002 Reserved for concepts with insufficient information to code with codable children: Secondary | ICD-10-CM

## 2014-06-05 DIAGNOSIS — E1165 Type 2 diabetes mellitus with hyperglycemia: Secondary | ICD-10-CM

## 2014-06-05 DIAGNOSIS — O0991 Supervision of high risk pregnancy, unspecified, first trimester: Secondary | ICD-10-CM

## 2014-06-05 DIAGNOSIS — O34219 Maternal care for unspecified type scar from previous cesarean delivery: Secondary | ICD-10-CM

## 2014-06-05 DIAGNOSIS — O3421 Maternal care for scar from previous cesarean delivery: Secondary | ICD-10-CM

## 2014-06-05 LAB — POCT URINALYSIS DIP (DEVICE)
Bilirubin Urine: NEGATIVE
Glucose, UA: NEGATIVE mg/dL
Hgb urine dipstick: NEGATIVE
KETONES UR: 15 mg/dL — AB
Leukocytes, UA: NEGATIVE
Nitrite: NEGATIVE
PH: 7 (ref 5.0–8.0)
Protein, ur: NEGATIVE mg/dL
Specific Gravity, Urine: 1.02 (ref 1.005–1.030)
UROBILINOGEN UA: 0.2 mg/dL (ref 0.0–1.0)

## 2014-06-05 NOTE — Progress Notes (Signed)
C/o of pelvic pain.

## 2014-06-05 NOTE — Patient Instructions (Signed)
Return to clinic for any obstetric concerns or go to [facility] for evaluation  

## 2014-06-05 NOTE — Progress Notes (Signed)
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  AFP only screen today, had normal first screen. No other complaints or concerns.  Labor and fetal movement precautions reviewed.

## 2014-06-08 LAB — ALPHA FETOPROTEIN, MATERNAL
AFP: 39.2 ng/mL
Curr Gest Age: 16.6 wks.days
MOM FOR AFP: 1.33
OPEN SPINA BIFIDA: NEGATIVE

## 2014-06-14 ENCOUNTER — Encounter (HOSPITAL_COMMUNITY): Payer: Self-pay

## 2014-06-14 ENCOUNTER — Other Ambulatory Visit: Payer: Self-pay | Admitting: Family Medicine

## 2014-06-14 ENCOUNTER — Ambulatory Visit (HOSPITAL_COMMUNITY)
Admission: RE | Admit: 2014-06-14 | Discharge: 2014-06-14 | Disposition: A | Discharge status: Home / Self Care | Payer: Medicaid Other | Source: Ambulatory Visit | Attending: Obstetrics and Gynecology | Admitting: Obstetrics and Gynecology

## 2014-06-14 ENCOUNTER — Other Ambulatory Visit (HOSPITAL_COMMUNITY): Payer: Self-pay | Admitting: Maternal and Fetal Medicine

## 2014-06-14 DIAGNOSIS — E1165 Type 2 diabetes mellitus with hyperglycemia: Secondary | ICD-10-CM

## 2014-06-14 DIAGNOSIS — O360920 Maternal care for other rhesus isoimmunization, second trimester, not applicable or unspecified: Secondary | ICD-10-CM | POA: Diagnosis not present

## 2014-06-14 DIAGNOSIS — O283 Abnormal ultrasonic finding on antenatal screening of mother: Secondary | ICD-10-CM | POA: Diagnosis not present

## 2014-06-14 DIAGNOSIS — E119 Type 2 diabetes mellitus without complications: Secondary | ICD-10-CM | POA: Diagnosis not present

## 2014-06-14 DIAGNOSIS — O24112 Pre-existing diabetes mellitus, type 2, in pregnancy, second trimester: Secondary | ICD-10-CM | POA: Diagnosis not present

## 2014-06-14 DIAGNOSIS — O289 Unspecified abnormal findings on antenatal screening of mother: Secondary | ICD-10-CM

## 2014-06-14 DIAGNOSIS — Z315 Encounter for genetic counseling: Secondary | ICD-10-CM | POA: Insufficient documentation

## 2014-06-14 DIAGNOSIS — E669 Obesity, unspecified: Secondary | ICD-10-CM

## 2014-06-14 DIAGNOSIS — Z0489 Encounter for examination and observation for other specified reasons: Secondary | ICD-10-CM | POA: Insufficient documentation

## 2014-06-14 DIAGNOSIS — Z3A18 18 weeks gestation of pregnancy: Secondary | ICD-10-CM

## 2014-06-14 DIAGNOSIS — O99212 Obesity complicating pregnancy, second trimester: Secondary | ICD-10-CM | POA: Insufficient documentation

## 2014-06-14 DIAGNOSIS — O3421 Maternal care for scar from previous cesarean delivery: Secondary | ICD-10-CM | POA: Diagnosis not present

## 2014-06-14 DIAGNOSIS — O10919 Unspecified pre-existing hypertension complicating pregnancy, unspecified trimester: Secondary | ICD-10-CM

## 2014-06-14 DIAGNOSIS — IMO0002 Reserved for concepts with insufficient information to code with codable children: Secondary | ICD-10-CM | POA: Insufficient documentation

## 2014-06-14 DIAGNOSIS — O24912 Unspecified diabetes mellitus in pregnancy, second trimester: Principal | ICD-10-CM

## 2014-06-14 NOTE — Progress Notes (Signed)
Genetic Counseling  Visit Summary Note  Appointment Date: 06/14/2014 Referred By: Tommie Samsook, Jayce G, DO  Date of Birth: 1985/07/17  Pregnancy history: [id number] Estimated Date of Delivery: 11/14/14 Estimated Gestational Age: [id number]  [name] [name] and her partner were seen for genetic counseling because of abnormal ultrasound findings.  The father of the baby's sister was also present  [name] was [facility] for routine anatomy ultrasound.  The ultrasound report will be sent under separate cover.  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 We discussed that the second trimester genetic sonogram is targeted at identifying features associated with aneuploidy.  It has evolved as a screening tool used to provide an individualized risk assessment for Down syndrome and other trisomies.  The ability of sonography to aid in the detection of aneuploidies relies on identification of both major structural anomalies and "soft markers."  The patient was counseled that the latter term refers to findings that are often normal variants and do not cause any significant medical problems.  Nonetheless, these markers have a known association with aneuploidy.    They were counseled that an absent nasal bone is a highly sensitive and specific marker for Down syndrome.  It is present in approximately 1% of chromosomally normal fetuses, but up to 70% of fetuses with Down syndrome, 55 % with trisomy 18, and 34% with trisomy 13.  The length of the fetal nasal bone varies by race and ethnicity in second trimester fetuses.  The associated risk of aneuploidy is highest in Caucasians, with a LR of ~50, but is only associated with an 8.5 LR in African Americans.    Echogenic bowel was also identified at the time of today's ultrasound.  We discussed that an echogenic bowel refers to an area in the fetal intestines that appears brighter, or denser, than the liver  and/or bone on ultrasound. Echogenic bowel is detected in approximately 1% of fetuses at the time of a second trimester ultrasound evaluation.  Most of the time, this brightness does not cause any problems and will spontaneously resolve.  Possible causes of an echogenic bowel include undergrowth of the bowel, an obstruction or blockage of the intestines, the fetus swallowing a small amount of blood present in the amniotic fluid, an intrauterine infection, a chromosome condition, cystic fibrosis, or a normal variation in development.   [name] did not report recent bleeding, she also denied known exposure to infections during the pregnancy. We discussed that the presence of echogenic bowel and absent nasal bone on ultrasound increases the chance for fetal Down syndrome from the patient's Quad screen result of 1 in [id number], but is still less than 1%. We reviewed available screening and diagnostic options including cell free fetal DNA testing and amniocentesis.  We discussed that noninvasive prenatal screening (NIPS) utilizes cell free fetal DNA found in the maternal circulation. This test is not diagnostic for chromosome conditions, but can provide information regarding the presence or absence of extra fetal DNA for chromosomes 13, 18, 21, X and Y.  We also discussed the diagnostic testing option of amniocentesis. We reviewed that in addition to chromosome analysis, infection studies can also be performed on amniotic fluid. We discussed the risks, limitations, and benefits of each. After consideration of all the options, she elected to proceed with cell free fetal DNA testing (Panorama) at the time of today's visit and elected to proceed with maternal TORCH titers. Panorama results  will be available in 8-10 days and will be forwarded to your office when we receive them.  She understands that ultrasound and screening cannot rule out all birth defects or genetic syndromes. A follow-up ultrasound was planned.   I  counseled this couple regarding the above risks and available options.  The approximate face-to-face time with the genetic counselor was 30 minutes.

## 2014-06-15 LAB — PARVOVIRUS B19 ANTIBODY, IGG AND IGM
PAROVIRUS B19 IGM ABS: 0.2 {index} (ref <0.9)
Parovirus B19 IgG Abs: 0.2 index (ref <0.9)

## 2014-06-15 LAB — CMV IGM: CMV IgM: <30 AU/mL (ref 0.0–29.9)

## 2014-06-15 LAB — CMV ANTIBODY, IGG (EIA)

## 2014-06-16 LAB — TOXOPLASMA ANTIBODIES- IGG AND  IGM
Toxoplasma Antibody- IgM: <3 IU/mL (ref <8.0)
Toxoplasma IgG Ratio: <3 IU/mL (ref <7.2)

## 2014-06-19 ENCOUNTER — Telehealth (HOSPITAL_COMMUNITY): Payer: Self-pay | Admitting: *Deleted

## 2014-06-19 ENCOUNTER — Ambulatory Visit (INDEPENDENT_AMBULATORY_CARE_PROVIDER_SITE_OTHER): Payer: Medicaid Other | Admitting: Obstetrics & Gynecology

## 2014-06-19 VITALS — BP 113/57 | HR 83 | Temp 98.8°F | Wt 205.4 lb

## 2014-06-19 DIAGNOSIS — O24913 Unspecified diabetes mellitus in pregnancy, third trimester: Secondary | ICD-10-CM

## 2014-06-19 DIAGNOSIS — E119 Type 2 diabetes mellitus without complications: Secondary | ICD-10-CM

## 2014-06-19 LAB — POCT URINALYSIS DIP (DEVICE)
Bilirubin Urine: NEGATIVE
GLUCOSE, UA: NEGATIVE mg/dL
HGB URINE DIPSTICK: NEGATIVE
Ketones, ur: 15 mg/dL — AB
Leukocytes, UA: NEGATIVE
NITRITE: NEGATIVE
PH: 6.5 (ref 5.0–8.0)
Protein, ur: NEGATIVE mg/dL
SPECIFIC GRAVITY, URINE: 1.02 (ref 1.005–1.030)
Urobilinogen, UA: 1 mg/dL (ref 0.0–1.0)

## 2014-06-19 NOTE — Telephone Encounter (Signed)
Lab results rev'd by Dr. Claudean SeveranceWhitecar.  Called pt to inform her of normal CMV, toxo and parvo results, panorama results pending. Pt voices understanding.

## 2014-06-19 NOTE — Progress Notes (Signed)
Forgot log book, will bring next visit. She reports they are in good range. Continue Glimepiride and Januvia as prescribed. No other complaints or concerns.  Routine obstetric precautions reviewed.

## 2014-06-21 ENCOUNTER — Telehealth (HOSPITAL_COMMUNITY): Payer: Self-pay | Admitting: Genetics

## 2014-06-21 NOTE — Telephone Encounter (Signed)
Called [name] [name] to discuss her cell free fetal DNA test results.  [name] had Panorama testing through [facility].  Testing was offered because of soft markers identified by ultrasound.   The patient was identified by name and DOB.  We reviewed that these are within normal limits, showing a less than 1 in 10,000 risk for trisomies 21, 18 and 13, and monosomy X (Turner syndrome).  In addition, the risk for triploidy/vanishing twin and sex chromosome trisomies (47,XXX and 47,XXY) was also low risk.  We reviewed that this testing identifies > 99% of pregnancies with trisomy 5121, trisomy 5813, sex chromosome trisomies (47,XXX and 47,XXY), and triploidy. The detection rate for trisomy 18 is 96%.  The detection rate for monosomy X is ~92%.  The false positive rate is <0.1% for all conditions. Testing was also consistent with femalefetal sex.  The patient was already aware of the fetal sex.  She understands that this testing does not identify all genetic conditions.  All questions were answered to her satisfaction, she was encouraged to call with additional questions or concerns.  Azalia Bilisonrad,Dorene Bruni M, MS Certified Genetic Counselor

## 2014-06-22 ENCOUNTER — Other Ambulatory Visit (HOSPITAL_COMMUNITY): Payer: Self-pay

## 2014-07-03 ENCOUNTER — Encounter: Payer: Self-pay | Admitting: Obstetrics and Gynecology

## 2014-07-03 ENCOUNTER — Ambulatory Visit (INDEPENDENT_AMBULATORY_CARE_PROVIDER_SITE_OTHER): Payer: Medicaid Other | Admitting: Obstetrics and Gynecology

## 2014-07-03 VITALS — BP 115/60 | HR 82 | Temp 98.4°F | Wt 206.1 lb

## 2014-07-03 DIAGNOSIS — IMO0002 Reserved for concepts with insufficient information to code with codable children: Secondary | ICD-10-CM

## 2014-07-03 DIAGNOSIS — O24913 Unspecified diabetes mellitus in pregnancy, third trimester: Secondary | ICD-10-CM

## 2014-07-03 DIAGNOSIS — E669 Obesity, unspecified: Secondary | ICD-10-CM

## 2014-07-03 DIAGNOSIS — O0991 Supervision of high risk pregnancy, unspecified, first trimester: Secondary | ICD-10-CM

## 2014-07-03 DIAGNOSIS — O3421 Maternal care for scar from previous cesarean delivery: Secondary | ICD-10-CM

## 2014-07-03 DIAGNOSIS — E1165 Type 2 diabetes mellitus with hyperglycemia: Secondary | ICD-10-CM

## 2014-07-03 DIAGNOSIS — O99212 Obesity complicating pregnancy, second trimester: Secondary | ICD-10-CM

## 2014-07-03 DIAGNOSIS — E119 Type 2 diabetes mellitus without complications: Secondary | ICD-10-CM

## 2014-07-03 DIAGNOSIS — O34219 Maternal care for unspecified type scar from previous cesarean delivery: Secondary | ICD-10-CM

## 2014-07-03 LAB — POCT URINALYSIS DIP (DEVICE)
Bilirubin Urine: NEGATIVE
GLUCOSE, UA: NEGATIVE mg/dL
Hgb urine dipstick: NEGATIVE
Ketones, ur: NEGATIVE mg/dL
NITRITE: NEGATIVE
Protein, ur: NEGATIVE mg/dL
SPECIFIC GRAVITY, URINE: 1.02 (ref 1.005–1.030)
UROBILINOGEN UA: 2 mg/dL — AB (ref 0.0–1.0)
pH: 7 (ref 5.0–8.0)

## 2014-07-03 NOTE — Progress Notes (Signed)
Patient is doing well without complaints.  Patient did not bring log book but reports highest fasting 122, normally ranging from 98-100's. Highest postprandial 96 Follow up ultrasound scheduled for 07/11/2013 Patient already scheduled for fetal echo

## 2014-07-06 ENCOUNTER — Encounter (HOSPITAL_COMMUNITY): Payer: Self-pay | Admitting: Maternal and Fetal Medicine

## 2014-07-12 ENCOUNTER — Encounter (HOSPITAL_COMMUNITY): Payer: Self-pay

## 2014-07-12 ENCOUNTER — Ambulatory Visit (HOSPITAL_COMMUNITY)
Admission: RE | Admit: 2014-07-12 | Discharge: 2014-07-12 | Disposition: A | Discharge status: Home / Self Care | Payer: Medicaid Other | Source: Ambulatory Visit | Attending: Obstetrics and Gynecology | Admitting: Obstetrics and Gynecology

## 2014-07-12 ENCOUNTER — Other Ambulatory Visit (HOSPITAL_COMMUNITY): Payer: Self-pay | Admitting: Maternal and Fetal Medicine

## 2014-07-12 DIAGNOSIS — O10919 Unspecified pre-existing hypertension complicating pregnancy, unspecified trimester: Secondary | ICD-10-CM

## 2014-07-12 DIAGNOSIS — Z3A22 22 weeks gestation of pregnancy: Secondary | ICD-10-CM | POA: Diagnosis not present

## 2014-07-12 DIAGNOSIS — O24912 Unspecified diabetes mellitus in pregnancy, second trimester: Secondary | ICD-10-CM

## 2014-07-12 DIAGNOSIS — E119 Type 2 diabetes mellitus without complications: Secondary | ICD-10-CM | POA: Diagnosis not present

## 2014-07-12 DIAGNOSIS — Z794 Long term (current) use of insulin: Secondary | ICD-10-CM | POA: Insufficient documentation

## 2014-07-12 DIAGNOSIS — O24112 Pre-existing diabetes mellitus, type 2, in pregnancy, second trimester: Secondary | ICD-10-CM | POA: Insufficient documentation

## 2014-07-12 DIAGNOSIS — O283 Abnormal ultrasonic finding on antenatal screening of mother: Secondary | ICD-10-CM | POA: Insufficient documentation

## 2014-07-12 DIAGNOSIS — O09292 Supervision of pregnancy with other poor reproductive or obstetric history, second trimester: Secondary | ICD-10-CM | POA: Insufficient documentation

## 2014-07-12 DIAGNOSIS — O358XX0 Maternal care for other (suspected) fetal abnormality and damage, not applicable or unspecified: Secondary | ICD-10-CM | POA: Insufficient documentation

## 2014-07-12 DIAGNOSIS — O24919 Unspecified diabetes mellitus in pregnancy, unspecified trimester: Secondary | ICD-10-CM | POA: Insufficient documentation

## 2014-07-14 ENCOUNTER — Inpatient Hospital Stay (HOSPITAL_COMMUNITY)
Admission: AD | Admit: 2014-07-14 | Discharge: 2014-07-14 | Disposition: A | Discharge status: Home / Self Care | Payer: Medicaid Other | Source: Ambulatory Visit | Attending: Obstetrics & Gynecology | Admitting: Obstetrics & Gynecology

## 2014-07-14 ENCOUNTER — Encounter (HOSPITAL_COMMUNITY): Payer: Self-pay | Admitting: *Deleted

## 2014-07-14 DIAGNOSIS — O10012 Pre-existing essential hypertension complicating pregnancy, second trimester: Secondary | ICD-10-CM | POA: Diagnosis not present

## 2014-07-14 DIAGNOSIS — R109 Unspecified abdominal pain: Secondary | ICD-10-CM | POA: Diagnosis present

## 2014-07-14 DIAGNOSIS — Z3A22 22 weeks gestation of pregnancy: Secondary | ICD-10-CM | POA: Diagnosis not present

## 2014-07-14 DIAGNOSIS — R638 Other symptoms and signs concerning food and fluid intake: Secondary | ICD-10-CM | POA: Diagnosis not present

## 2014-07-14 DIAGNOSIS — E119 Type 2 diabetes mellitus without complications: Secondary | ICD-10-CM | POA: Insufficient documentation

## 2014-07-14 DIAGNOSIS — O24112 Pre-existing diabetes mellitus, type 2, in pregnancy, second trimester: Secondary | ICD-10-CM | POA: Insufficient documentation

## 2014-07-14 DIAGNOSIS — O9989 Other specified diseases and conditions complicating pregnancy, childbirth and the puerperium: Secondary | ICD-10-CM | POA: Insufficient documentation

## 2014-07-14 DIAGNOSIS — O24912 Unspecified diabetes mellitus in pregnancy, second trimester: Secondary | ICD-10-CM | POA: Diagnosis not present

## 2014-07-14 LAB — URINALYSIS, ROUTINE W REFLEX MICROSCOPIC
Bilirubin Urine: NEGATIVE
Glucose, UA: 500 mg/dL — AB
HGB URINE DIPSTICK: NEGATIVE
Ketones, ur: 15 mg/dL — AB
LEUKOCYTES UA: NEGATIVE
Nitrite: NEGATIVE
PROTEIN: NEGATIVE mg/dL
Specific Gravity, Urine: 1.02 (ref 1.005–1.030)
UROBILINOGEN UA: 4 mg/dL — AB (ref 0.0–1.0)
pH: 7 (ref 5.0–8.0)

## 2014-07-14 LAB — GLUCOSE, CAPILLARY: GLUCOSE-CAPILLARY: 163 mg/dL — AB (ref 70–99)

## 2014-07-14 NOTE — MAU Note (Signed)
Lower abd cramping & back pain started last night, became worse this morning.  Pt denies bleeding or LOF.

## 2014-07-14 NOTE — MAU Note (Signed)
Pt. States she has been having lower abdominal cramping and pain since last night. This pain became worse this am. Denies bleeding or LOF. Denies strenuous activity. Last intercourse 2 weeks ago. Next OB appointment is scheduled for Monday.

## 2014-07-14 NOTE — MAU Provider Note (Signed)
History     CSN: [id number]  Arrival date and time: 07/14/14 1256   None     Chief Complaint  Patient presents with  . Abdominal Pain  . Back Pain   HPI Patient is 29 y.o. [id number] 41w3dhere with complaints of cramping since night before presentation.  She reports cramps located midline, lower abdomen, last abut 5 min, come every 10 min.  Nothing makes them better/worse.  Associated sx of headache.  H/o DM2 prior to pregnancy on Januvia and glyburide.  Review of records showed previously well-controlled with glucose 90-120s.   [facility]x significant for 2 miscarriages at 6 and 18 wks.  She has a 312yo son born at 341 wks   +FM, denies LOF, VB, contractions, vaginal discharge.    Past Medical History  Diagnosis Date  . Hypertension   . Herpes simplex without mention of complication   . Obesity   . Diabetes mellitus     type II  . Irregular menses   . Abnormal Pap smear   . GBS (group B streptococcus) UTI complicating pregnancy     Past Surgical History  Procedure Laterality Date  . Cesarean section  01/30/2011    Procedure: CESAREAN SECTION;  Surgeon: JJonnie Kind MD;  [facility];  Service: Gynecology;  Laterality: N/A;    Family History  Problem Relation Age of Onset  . Diabetes Paternal Grandmother   . Hypertension Paternal Grandmother   . Diabetes Maternal Grandmother   . Diabetes Father   . Cancer Paternal Uncle     History  Substance Use Topics  . Smoking status: Never Smoker   . Smokeless tobacco: Never Used  . Alcohol Use: No    Allergies: No Known Allergies  Prescriptions prior to admission  Medication Sig Dispense Refill Last Dose  . ACCU-CHEK FASTCLIX LANCETS MISC Check sugar up to 6 x daily 204 each 3 07/13/2014 at Unknown time  . aspirin EC 81 MG tablet Take 1 tablet (81 mg total) by mouth daily. Take after 12 weeks for prevention of preeclampssia later in pregnancy 300 tablet 2 07/13/2014 at Unknown time  . Blood  Glucose Monitoring Suppl (ACCU-CHEK NANO SMARTVIEW) W/DEVICE KIT Use up to 6 times daily to test blood sugars 1 kit 0 07/13/2014 at Unknown time  . glimepiride (AMARYL) 4 MG tablet Take 1.5 tablets (6 mg total) by mouth daily before breakfast. 45 tablet 6 07/13/2014 at Unknown time  . glucose blood (ACCU-CHEK SMARTVIEW) test strip Check sugar 6 x daily 200 each 3 07/13/2014 at Unknown time  . prenatal vitamin w/FE, FA (PRENATAL 1 + 1) 27-1 MG TABS tablet Take 1 tablet by mouth daily at 12 noon.   07/13/2014 at Unknown time  . sitaGLIPtin (JANUVIA) 100 MG tablet Take 1 tablet (100 mg total) by mouth daily. 30 tablet 6 07/13/2014 at Unknown time  . acetaminophen (TYLENOL) 325 MG tablet Take 325 mg by mouth every 6 (six) hours as needed for mild pain or headache.   More than a month at Unknown time  . albuterol (PROVENTIL HFA;VENTOLIN HFA) 108 (90 BASE) MCG/ACT inhaler Inhale 1-2 puffs into the lungs every 6 (six) hours as needed for wheezing or shortness of breath. (Patient not taking: Reported on 07/14/2014) 1 Inhaler 0 Not Taking    Review of Systems  Constitutional: Negative for fever and chills.  Eyes: Negative for blurred vision.  Respiratory: Negative for cough and shortness of breath.   Cardiovascular:  Negative for leg swelling.  Gastrointestinal: Positive for abdominal pain. Negative for nausea, vomiting and diarrhea.  Genitourinary: Negative for dysuria and hematuria.  Musculoskeletal: Negative for back pain.  Neurological: Positive for headaches.   Physical Exam   Blood pressure 120/67, pulse 92, temperature 98.9 F (37.2 C), temperature source Oral, resp. rate 18, height 5' 1"  (1.549 m), last menstrual period 02/23/2014, SpO2 100 %.  Physical Exam  Constitutional: She appears well-developed and well-nourished. No distress.  HENT:  Head: Normocephalic and atraumatic.  Eyes: Conjunctivae are normal. Pupils are equal, round, and reactive to light.  Neck: Normal range of motion.   Cardiovascular: Normal rate.   Respiratory: Effort normal. No respiratory distress.  GI: Soft. She exhibits no distension. There is no tenderness.  Musculoskeletal: Normal range of motion. She exhibits no edema.  Neurological: She is alert. Coordination normal.  Skin: Skin is warm and dry. She is not diaphoretic.    MAU Course  Procedures   Assessment and Plan  A: Patient is 29 y.o. [id number] 36w3dhere with complaints of abdominal pain since since one day prior to admission     UA with glucose of 500; 15 ketones     CBG 161; with last meal at 0900 this AM     Reassuring fetal heart tones by doppler     Mom's BP and pulse are wnl     U/s performed 2 days prior to presentation with cervical length of 3.1cm  P: Encouraged strict glucose control; adequate hydration      Keep follow up OB appointments; discussed return to hospital precautions        [name],[name] 07/14/2014, 2:50 PM   OB fellow attestation:  I have seen and examined this patient; I agree with above documentation in the resident's note.   [name] D CHardimanis a 29y.o. [phone] abdominal cramping +FM, denies LOF, VB, contractions, vaginal discharge.  PE: BP 128/71 mmHg  Pulse 96  Temp(Src) 98.9 F (37.2 C) (Oral)  Resp 18  Ht 5' 1"  (1.549 m)  SpO2 100%  LMP 02/23/2014 Gen: calm comfortable, NAD Resp: normal effort, no distress Abd: gravid Cervix fingertip on my exam, reassuring, firm  ROS, labs, PMH reviewed  Plan: - CBG elevated, with glucosuria and few ketones in urine likely secondary to starvation ketoacidosis as patient has not eaten since yesterday.  Advised PO hydration, at this time low suspicion of DKA given relatively well controlled DM based on prenatal records.  Also no tachycardia or other signs of volume depletion. => PO hydration, advised to eat meals regularly  [name] Pedraza ROCIO, MD 3:10 AM

## 2014-07-17 ENCOUNTER — Ambulatory Visit (INDEPENDENT_AMBULATORY_CARE_PROVIDER_SITE_OTHER): Payer: Medicaid Other | Admitting: Obstetrics and Gynecology

## 2014-07-17 ENCOUNTER — Encounter: Payer: Self-pay | Admitting: Obstetrics and Gynecology

## 2014-07-17 VITALS — BP 100/56 | HR 92 | Temp 98.6°F | Wt 205.6 lb

## 2014-07-17 DIAGNOSIS — O0991 Supervision of high risk pregnancy, unspecified, first trimester: Secondary | ICD-10-CM

## 2014-07-17 DIAGNOSIS — IMO0002 Reserved for concepts with insufficient information to code with codable children: Secondary | ICD-10-CM

## 2014-07-17 DIAGNOSIS — E669 Obesity, unspecified: Secondary | ICD-10-CM

## 2014-07-17 DIAGNOSIS — E119 Type 2 diabetes mellitus without complications: Secondary | ICD-10-CM

## 2014-07-17 DIAGNOSIS — O24913 Unspecified diabetes mellitus in pregnancy, third trimester: Secondary | ICD-10-CM

## 2014-07-17 DIAGNOSIS — O99212 Obesity complicating pregnancy, second trimester: Secondary | ICD-10-CM

## 2014-07-17 DIAGNOSIS — E1165 Type 2 diabetes mellitus with hyperglycemia: Secondary | ICD-10-CM

## 2014-07-17 DIAGNOSIS — O3421 Maternal care for scar from previous cesarean delivery: Secondary | ICD-10-CM

## 2014-07-17 DIAGNOSIS — O34219 Maternal care for unspecified type scar from previous cesarean delivery: Secondary | ICD-10-CM

## 2014-07-17 LAB — POCT URINALYSIS DIP (DEVICE)
BILIRUBIN URINE: NEGATIVE
Glucose, UA: 500 mg/dL — AB
Hgb urine dipstick: NEGATIVE
KETONES UR: 80 mg/dL — AB
Leukocytes, UA: NEGATIVE
Nitrite: NEGATIVE
PH: 6 (ref 5.0–8.0)
PROTEIN: NEGATIVE mg/dL
SPECIFIC GRAVITY, URINE: 1.025 (ref 1.005–1.030)
Urobilinogen, UA: 1 mg/dL (ref 0.0–1.0)

## 2014-07-17 NOTE — Progress Notes (Signed)
Patient is doing well without complaints.  CBG- Patient admits to not checking CBG for the past 2 weeks because she is too tired. Informed patient that ? heart defect noticed on ultrasound is likely due to poorly controlled diabetes and I reminded the patient of the importance of brining log to help up adjust insulin regimen in order to prevent further complications such as IUFD. Patient verbalized understanding   Follow up fetal echo scheduled for 3/8 secondary to ? Membranous VSD on anatomy Follow up growth ultrasound at end of March already scheduled

## 2014-07-23 ENCOUNTER — Inpatient Hospital Stay (HOSPITAL_COMMUNITY)
Admission: AD | Admit: 2014-07-23 | Discharge: 2014-07-23 | Disposition: A | Discharge status: Home / Self Care | Payer: Medicaid Other | Source: Ambulatory Visit | Attending: Obstetrics and Gynecology | Admitting: Obstetrics and Gynecology

## 2014-07-23 ENCOUNTER — Encounter (HOSPITAL_COMMUNITY): Payer: Self-pay

## 2014-07-23 DIAGNOSIS — Z3A23 23 weeks gestation of pregnancy: Secondary | ICD-10-CM | POA: Diagnosis not present

## 2014-07-23 DIAGNOSIS — K529 Noninfective gastroenteritis and colitis, unspecified: Secondary | ICD-10-CM | POA: Diagnosis not present

## 2014-07-23 DIAGNOSIS — O24414 Gestational diabetes mellitus in pregnancy, insulin controlled: Secondary | ICD-10-CM | POA: Insufficient documentation

## 2014-07-23 DIAGNOSIS — O212 Late vomiting of pregnancy: Secondary | ICD-10-CM | POA: Diagnosis present

## 2014-07-23 DIAGNOSIS — O9989 Other specified diseases and conditions complicating pregnancy, childbirth and the puerperium: Secondary | ICD-10-CM | POA: Diagnosis not present

## 2014-07-23 LAB — URINALYSIS, ROUTINE W REFLEX MICROSCOPIC
BILIRUBIN URINE: NEGATIVE
GLUCOSE, UA: 250 mg/dL — AB
Hgb urine dipstick: NEGATIVE
Ketones, ur: 40 mg/dL — AB
Leukocytes, UA: NEGATIVE
Nitrite: NEGATIVE
PH: 6 (ref 5.0–8.0)
Protein, ur: NEGATIVE mg/dL
Specific Gravity, Urine: 1.025 (ref 1.005–1.030)
UROBILINOGEN UA: 1 mg/dL (ref 0.0–1.0)

## 2014-07-23 MED ORDER — ONDANSETRON 8 MG PO TBDP
8.0000 mg | ORAL_TABLET | Freq: Once | ORAL | Status: AC
  Administered 2014-07-23: 8 mg via ORAL
  Filled 2014-07-23: qty 1

## 2014-07-23 NOTE — Progress Notes (Signed)
Notified of pt's c/o vomiting, will come see in MAU.

## 2014-07-23 NOTE — Discharge Instructions (Signed)
Second Trimester of Pregnancy The second trimester is from week 13 through week 28, month 4 through 6. This is often the time in pregnancy that you feel your best. Often times, morning sickness has lessened or quit. You may have more energy, and you may get hungry more often. Your unborn baby (fetus) is growing rapidly. At the end of the sixth month, he or she is about 9 inches long and weighs about 1 pounds. You will likely feel the baby move (quickening) between 18 and 20 weeks of pregnancy. HOME CARE   Avoid all smoking, herbs, and alcohol. Avoid drugs not approved by your doctor.  Only take medicine as told by your doctor. Some medicines are safe and some are not during pregnancy.  Exercise only as told by your doctor. Stop exercising if you start having cramps.  Eat regular, healthy meals.  Wear a good support bra if your breasts are tender.  Do not use hot tubs, steam rooms, or saunas.  Wear your seat belt when driving.  Avoid raw meat, uncooked cheese, and liter boxes and soil used by cats.  Take your prenatal vitamins.  Try taking medicine that helps you poop (stool softener) as needed, and if your doctor approves. Eat more fiber by eating fresh fruit, vegetables, and whole grains. Drink enough fluids to keep your pee (urine) clear or pale yellow.  Take warm water baths (sitz baths) to soothe pain or discomfort caused by hemorrhoids. Use hemorrhoid cream if your doctor approves.  If you have puffy, bulging veins (varicose veins), wear support hose. Raise (elevate) your feet for 15 minutes, 3-4 times a day. Limit salt in your diet.  Avoid heavy lifting, wear low heals, and sit up straight.  Rest with your legs raised if you have leg cramps or low back pain.  Visit your dentist if you have not gone during your pregnancy. Use a soft toothbrush to brush your teeth. Be gentle when you floss.  You can have sex (intercourse) unless your doctor tells you not to.  Go to your  doctor visits. GET HELP IF:   You feel dizzy.  You have mild cramps or pressure in your lower belly (abdomen).  You have a nagging pain in your belly area.  You continue to feel sick to your stomach (nauseous), throw up (vomit), or have watery poop (diarrhea).  You have bad smelling fluid coming from your vagina.  You have pain with peeing (urination). GET HELP RIGHT AWAY IF:   You have a fever.  You are leaking fluid from your vagina.  You have spotting or bleeding from your vagina.  You have severe belly cramping or pain.  You lose or gain weight rapidly.  You have trouble catching your breath and have chest pain.  You notice sudden or extreme puffiness (swelling) of your face, hands, ankles, feet, or legs.  You have not felt the baby move in over an hour.  You have severe headaches that do not go away with medicine.  You have vision changes. Document Released: 07/23/2009 Document Revised: 08/23/2012 Document Reviewed: 06/29/2012 Exit[employer] Patient Information 2015 [employer]. This information is not intended to replace advice given to you by your health care provider. Make sure you discuss any questions you have with your health care provider.  

## 2014-07-23 NOTE — MAU Provider Note (Signed)
History   [id number] in with c/o vomiting since yesterday. Denies fever. preg at 23 wks. GDM on metformin.  CSN: [id number]  Arrival date and time: 07/23/14 1257   First Provider Initiated Contact with Patient 07/23/14 1420      Chief Complaint  Patient presents with  . Emesis   HPI  OB History    Gravida Para Term Preterm AB TAB SAB Ectopic Multiple Living   _0 Past Medical History  Diagnosis Date  . Hypertension   . Herpes simplex without mention of complication   . Obesity   . Diabetes mellitus     type II  . Irregular menses   . Abnormal Pap smear   . GBS (group B streptococcus) UTI complicating pregnancy     Past Surgical History  Procedure Laterality Date  . Cesarean section  01/30/2011    Procedure: CESAREAN SECTION;  Surgeon: [name], MD;  Location: [facility];  Service: Gynecology;  Laterality: N/A;    Family History  Problem Relation Age of Onset  . Diabetes Paternal Grandmother   . Hypertension Paternal Grandmother   . Diabetes Maternal Grandmother   . Diabetes Father   . Cancer Paternal Uncle     History  Substance Use Topics  . Smoking status: Never Smoker   . Smokeless tobacco: Never Used  . Alcohol Use: No    Allergies: No Known Allergies  Prescriptions prior to admission  Medication Sig Dispense Refill Last Dose  . ACCU-CHEK FASTCLIX LANCETS MISC Check sugar up to 6 x daily 204 each 3 Taking  . acetaminophen (TYLENOL) 325 MG tablet Take 325 mg by mouth every 6 (six) hours as needed for mild pain or headache.   Taking  . aspirin EC 81 MG tablet Take 1 tablet (81 mg total) by mouth daily. Take after 12 weeks for prevention of preeclampssia later in pregnancy 300 tablet 2 Taking  . Blood Glucose Monitoring Suppl (ACCU-CHEK NANO SMARTVIEW) W/DEVICE KIT Use up to 6 times daily to test blood sugars 1 kit 0 Taking  . glimepiride (AMARYL) 4 MG tablet Take 1.5 tablets (6 mg total) by mouth daily before breakfast. 45 tablet 6  Taking  . glucose blood (ACCU-CHEK SMARTVIEW) test strip Check sugar 6 x daily 200 each 3 Taking  . prenatal vitamin w/FE, FA (PRENATAL 1 + 1) 27-1 MG TABS tablet Take 1 tablet by mouth daily at 12 noon.   Taking  . sitaGLIPtin (JANUVIA) 100 MG tablet Take 1 tablet (100 mg total) by mouth daily. 30 tablet 6 Taking    Review of Systems  Constitutional: Negative.   HENT: Negative.   Eyes: Negative.   Respiratory: Negative.   Cardiovascular: Negative.   Gastrointestinal: Positive for nausea and vomiting.  Genitourinary: Negative.   Musculoskeletal: Negative.   Skin: Negative.   Neurological: Negative.   Endo/Heme/Allergies: Negative.   Psychiatric/Behavioral: Negative.    Physical Exam   Blood pressure 134/73, pulse 92, temperature 98 F (36.7 C), resp. rate 18, height _1  (1.575 m), weight 206 lb (93.441 kg), last menstrual period 02/23/2014.  Physical Exam  Constitutional: She is oriented to person, place, and time. She appears well-developed and well-nourished.  HENT:  Head: Normocephalic.  Eyes: Pupils are equal, round, and reactive to light.  Neck: Normal range of motion.  Cardiovascular: Normal rate, regular rhythm, normal heart sounds and intact distal pulses.   Respiratory: Effort normal and breath  sounds normal.  GI: Soft. Bowel sounds are normal.  Genitourinary: Vagina normal.  Musculoskeletal: Normal range of motion.  Neurological: She is alert and oriented to person, place, and time. She has normal reflexes.  Skin: Skin is warm and dry.  Psychiatric: She has a normal mood and affect. Her behavior is normal. Judgment and thought content normal.    MAU Course  Procedures  MDM Gastroenteritis at 23 wks  Assessment and Plan  Gastroenteritis, will tx with SL zofran and introduce fluids if tolerates will d/c home.  [name] [name] DARLENE 07/23/2014, 3:15 PM

## 2014-07-23 NOTE — MAU Note (Signed)
Pt presents to MAU with complaints of having vomiting since yesterday. States she can only keep gatorade down.

## 2014-07-23 NOTE — MAU Note (Signed)
Vomiting since yesterday. Denies diarrhea. No fever or chills.

## 2014-07-31 ENCOUNTER — Ambulatory Visit (INDEPENDENT_AMBULATORY_CARE_PROVIDER_SITE_OTHER): Payer: Medicaid Other | Admitting: Family Medicine

## 2014-07-31 VITALS — BP 111/71 | HR 81 | Temp 98.4°F | Wt 205.7 lb

## 2014-07-31 DIAGNOSIS — O0991 Supervision of high risk pregnancy, unspecified, first trimester: Secondary | ICD-10-CM

## 2014-07-31 DIAGNOSIS — O0992 Supervision of high risk pregnancy, unspecified, second trimester: Secondary | ICD-10-CM

## 2014-07-31 DIAGNOSIS — O24312 Unspecified pre-existing diabetes mellitus in pregnancy, second trimester: Secondary | ICD-10-CM

## 2014-07-31 DIAGNOSIS — O24313 Unspecified pre-existing diabetes mellitus in pregnancy, third trimester: Secondary | ICD-10-CM

## 2014-07-31 LAB — POCT URINALYSIS DIP (DEVICE)
BILIRUBIN URINE: NEGATIVE
GLUCOSE, UA: NEGATIVE mg/dL
HGB URINE DIPSTICK: NEGATIVE
Ketones, ur: 15 mg/dL — AB
Leukocytes, UA: NEGATIVE
Nitrite: NEGATIVE
Protein, ur: NEGATIVE mg/dL
Specific Gravity, Urine: 1.02 (ref 1.005–1.030)
Urobilinogen, UA: 1 mg/dL (ref 0.0–1.0)
pH: 7 (ref 5.0–8.0)

## 2014-07-31 MED ORDER — PROMETHAZINE HCL 25 MG PO TABS: 25.0000 mg | ORAL_TABLET | Freq: Four times a day (QID) | ORAL | Status: DC | PRN

## 2014-07-31 MED ORDER — CYCLOBENZAPRINE HCL 5 MG PO TABS: 5.0000 mg | ORAL_TABLET | Freq: Three times a day (TID) | ORAL | Status: DC | PRN

## 2014-07-31 NOTE — Progress Notes (Signed)
Patient reports really bad back pain this morning right above tailbone. Also having pain/pressure in stomach

## 2014-07-31 NOTE — Progress Notes (Signed)
Has growth sono already ordered.

## 2014-07-31 NOTE — Patient Instructions (Signed)

## 2014-07-31 NOTE — Progress Notes (Signed)
Patient is 29 y.o. [id number] 9279w6d.  +FM, denies LOF, VB, contractions, vaginal discharge.  Having back pain. A2DM: still on medications, "forgot book", no CBGs to review.  Reports fasting 90-160, postprandials usually 80s  Does not bring log in, ?treat as A2/BDM Rhogam at 28w

## 2014-08-08 ENCOUNTER — Encounter (HOSPITAL_COMMUNITY): Payer: Self-pay | Admitting: Obstetrics & Gynecology

## 2014-08-09 ENCOUNTER — Encounter (HOSPITAL_COMMUNITY): Payer: Self-pay | Admitting: *Deleted

## 2014-08-09 ENCOUNTER — Encounter (HOSPITAL_COMMUNITY): Payer: Self-pay

## 2014-08-09 ENCOUNTER — Inpatient Hospital Stay (HOSPITAL_COMMUNITY)
Admission: AD | Admit: 2014-08-09 | Discharge: 2014-08-09 | Disposition: A | Discharge status: Home / Self Care | Payer: Medicaid Other | Source: Ambulatory Visit | Attending: Obstetrics & Gynecology | Admitting: Obstetrics & Gynecology

## 2014-08-09 ENCOUNTER — Ambulatory Visit (HOSPITAL_COMMUNITY)
Admission: RE | Admit: 2014-08-09 | Discharge: 2014-08-09 | Disposition: A | Discharge status: Home / Self Care | Payer: Medicaid Other | Source: Ambulatory Visit | Attending: Obstetrics and Gynecology | Admitting: Obstetrics and Gynecology

## 2014-08-09 DIAGNOSIS — R51 Headache: Secondary | ICD-10-CM | POA: Diagnosis present

## 2014-08-09 DIAGNOSIS — E1165 Type 2 diabetes mellitus with hyperglycemia: Secondary | ICD-10-CM | POA: Insufficient documentation

## 2014-08-09 DIAGNOSIS — O358XX0 Maternal care for other (suspected) fetal abnormality and damage, not applicable or unspecified: Secondary | ICD-10-CM | POA: Diagnosis not present

## 2014-08-09 DIAGNOSIS — Z3A26 26 weeks gestation of pregnancy: Secondary | ICD-10-CM | POA: Diagnosis not present

## 2014-08-09 DIAGNOSIS — O26892 Other specified pregnancy related conditions, second trimester: Secondary | ICD-10-CM

## 2014-08-09 DIAGNOSIS — O99212 Obesity complicating pregnancy, second trimester: Secondary | ICD-10-CM | POA: Insufficient documentation

## 2014-08-09 DIAGNOSIS — E669 Obesity, unspecified: Secondary | ICD-10-CM | POA: Insufficient documentation

## 2014-08-09 DIAGNOSIS — O9989 Other specified diseases and conditions complicating pregnancy, childbirth and the puerperium: Secondary | ICD-10-CM | POA: Diagnosis not present

## 2014-08-09 DIAGNOSIS — E119 Type 2 diabetes mellitus without complications: Secondary | ICD-10-CM | POA: Diagnosis not present

## 2014-08-09 DIAGNOSIS — O3421 Maternal care for scar from previous cesarean delivery: Secondary | ICD-10-CM | POA: Insufficient documentation

## 2014-08-09 DIAGNOSIS — O24112 Pre-existing diabetes mellitus, type 2, in pregnancy, second trimester: Secondary | ICD-10-CM | POA: Insufficient documentation

## 2014-08-09 DIAGNOSIS — Z7982 Long term (current) use of aspirin: Secondary | ICD-10-CM | POA: Diagnosis not present

## 2014-08-09 DIAGNOSIS — O34219 Maternal care for unspecified type scar from previous cesarean delivery: Secondary | ICD-10-CM

## 2014-08-09 DIAGNOSIS — Z6838 Body mass index (BMI) 38.0-38.9, adult: Secondary | ICD-10-CM | POA: Insufficient documentation

## 2014-08-09 DIAGNOSIS — O10919 Unspecified pre-existing hypertension complicating pregnancy, unspecified trimester: Secondary | ICD-10-CM

## 2014-08-09 DIAGNOSIS — O24912 Unspecified diabetes mellitus in pregnancy, second trimester: Secondary | ICD-10-CM

## 2014-08-09 LAB — COMPREHENSIVE METABOLIC PANEL
ALT: 10 U/L (ref 0–35)
AST: 10 U/L (ref 0–37)
Albumin: 3 g/dL — ABNORMAL LOW (ref 3.5–5.2)
Alkaline Phosphatase: 99 U/L (ref 39–117)
Anion gap: 10 (ref 5–15)
BUN: 7 mg/dL (ref 6–23)
CO2: 22 mmol/L (ref 19–32)
Calcium: 8.9 mg/dL (ref 8.4–10.5)
Chloride: 98 mmol/L (ref 96–112)
Creatinine, Ser: 0.56 mg/dL (ref 0.50–1.10)
GFR calc Af Amer: >90 mL/min (ref >90)
GFR calc non Af Amer: >90 mL/min (ref >90)
Glucose, Bld: 275 mg/dL — ABNORMAL HIGH (ref 70–99)
Potassium: 3.8 mmol/L (ref 3.5–5.1)
Sodium: 130 mmol/L — ABNORMAL LOW (ref 135–145)
Total Bilirubin: 0.2 mg/dL — ABNORMAL LOW (ref 0.3–1.2)
Total Protein: 7.1 g/dL (ref 6.0–8.3)

## 2014-08-09 LAB — CBC
HCT: 34.4 % — ABNORMAL LOW (ref 36.0–46.0)
Hemoglobin: 11.2 g/dL — ABNORMAL LOW (ref 12.0–15.0)
MCH: 27.2 pg (ref 26.0–34.0)
MCHC: 32.6 g/dL (ref 30.0–36.0)
MCV: 83.5 fL (ref 78.0–100.0)
Platelets: 312 10*3/uL (ref 150–400)
RBC: 4.12 MIL/uL (ref 3.87–5.11)
RDW: 14.3 % (ref 11.5–15.5)
WBC: 8.6 10*3/uL (ref 4.0–10.5)

## 2014-08-09 LAB — GLUCOSE, CAPILLARY: GLUCOSE-CAPILLARY: 259 mg/dL — AB (ref 70–99)

## 2014-08-09 LAB — PROTEIN / CREATININE RATIO, URINE
Creatinine, Urine: 90 mg/dL
Protein Creatinine Ratio: 0.16 — ABNORMAL HIGH (ref 0.00–0.15)
Total Protein, Urine: 14 mg/dL

## 2014-08-09 LAB — URIC ACID: Uric Acid, Serum: 2.9 mg/dL (ref 2.4–7.0)

## 2014-08-09 MED ORDER — ONDANSETRON HCL 4 MG PO TABS: 4.0000 mg | ORAL_TABLET | Freq: Every day | ORAL | Status: DC | PRN

## 2014-08-09 MED ORDER — BUTALBITAL-APAP-CAFFEINE 50-325-40 MG PO TABS: 1.0000 | ORAL_TABLET | Freq: Four times a day (QID) | ORAL | Status: DC | PRN

## 2014-08-09 MED ORDER — BUTALBITAL-APAP-CAFFEINE 50-325-40 MG PO TABS
1.0000 | ORAL_TABLET | Freq: Once | ORAL | Status: AC
  Administered 2014-08-09: 1 via ORAL
  Filled 2014-08-09: qty 1

## 2014-08-09 MED ORDER — ONDANSETRON HCL 4 MG PO TABS
8.0000 mg | ORAL_TABLET | Freq: Once | ORAL | Status: AC
  Administered 2014-08-09: 8 mg via ORAL
  Filled 2014-08-09 (×2): qty 2

## 2014-08-09 NOTE — MAU Provider Note (Signed)
Chief Complaint: Headache  First Provider Initiated Contact with Patient 08/09/14 1147   HPI: [name] [name] is a 29 y.o. G4P1021 at 56w1dby LMP with a hx of T2DM and gHTN presents to maternity admissions reporting headache. Headache started Monday night, pt describes continuous throbbing pain in the forehead area, pain score 10/10. This is the first time pt has experienced such headache, she used acetaminophen with no relief and activity/turning head makes it worse, pt reports vomiting this morning. Pt reports no scotoma, no upper quadrant abdominal pain, and no swelling. Denies contractions, leakage of fluid or vaginal bleeding. Good fetal movement.   Pregnancy Course:   Past Medical History: Past Medical History  Diagnosis Date  . Hypertension   . Herpes simplex without mention of complication   . Obesity   . Diabetes mellitus     type II  . Irregular menses   . Abnormal Pap smear   . GBS (group B streptococcus) UTI complicating pregnancy     Past obstetric history: OB History  Gravida Para Term Preterm AB SAB TAB Ectopic Multiple Living  4 1 1  2 2    1     # Outcome Date GA Lbr Len/2nd Weight Sex Delivery Anes PTL Lv  4 Current           3 Term 01/30/11 353w6d M Berenice BoutonPI  Y  2 SAB 09/11/01 [id number]    N  1 SAB               Past Surgical History: Past Surgical History  Procedure Laterality Date  . Cesarean section  01/30/2011    Procedure: CESAREAN SECTION; Surgeon: JohJonnie KindD; [facility]; Service: Gynecology; Laterality: N/A;    Family History: Family History  Problem Relation Age of Onset  . Diabetes Paternal Grandmother   . Hypertension Paternal Grandmother   . Diabetes Maternal Grandmother   . Diabetes Father   . Cancer Paternal Uncle     Social History: History  Substance Use  Topics  . Smoking status: Never Smoker   . Smokeless tobacco: Never Used  . Alcohol Use: No    Allergies: No Known Allergies  Meds:  Prescriptions prior to admission  Medication Sig Dispense Refill Last Dose  . acetaminophen (TYLENOL) 325 MG tablet Take 325 mg by mouth every 6 (six) hours as needed for mild pain or headache.   08/09/2014 at Unknown time  . aspirin EC 81 MG tablet Take 1 tablet (81 mg total) by mouth daily. Take after 12 weeks for prevention of preeclampssia later in pregnancy 300 tablet 2 08/09/2014 at Unknown time  . glimepiride (AMARYL) 4 MG tablet Take 1.5 tablets (6 mg total) by mouth daily before breakfast. 45 tablet 6 08/09/2014 at Unknown time  . prenatal vitamin w/FE, FA (PRENATAL 1 + 1) 27-1 MG TABS tablet Take 1 tablet by mouth daily at 12 noon.   08/08/2014 at Unknown time  . sitaGLIPtin (JANUVIA) 100 MG tablet Take 1 tablet (100 mg total) by mouth daily. 30 tablet 6 08/09/2014 at Unknown time  . ACCU-CHEK FASTCLIX LANCETS MISC Check sugar up to 6 x daily 204 each 3 Taking  . Blood Glucose Monitoring Suppl (ACCU-CHEK NANO SMARTVIEW) W/DEVICE KIT Use up to 6 times daily to test blood sugars 1 kit 0 Taking  . cyclobenzaprine (FLEXERIL) 5 MG tablet Take 1 tablet (5 mg total) by mouth 3 (three) times daily as needed for muscle spasms. (Patient  not taking: Reported on 08/09/2014) 30 tablet 0 Taking  . glucose blood (ACCU-CHEK SMARTVIEW) test strip Check sugar 6 x daily 200 each 3 Taking  . promethazine (PHENERGAN) 25 MG tablet Take 1 tablet (25 mg total) by mouth every 6 (six) hours as needed for nausea or vomiting. (Patient not taking: Reported on 08/09/2014) 30 tablet 1 Taking    ROS:  Review of Systems  Constitutional: Negative for fever and chills.  HENT: Negative for ear pain.   Phonophobia  Eyes: Positive for photophobia. Negative for blurred vision and double vision.  Respiratory:  Negative for cough.  Cardiovascular: Negative for chest pain and palpitations.  Gastrointestinal: Positive for heartburn, nausea and vomiting.   N/V this morning  Genitourinary: Negative for dysuria and hematuria.  Neurological: Positive for headaches. Negative for dizziness and tingling.  Psychiatric/Behavioral: Negative.    Physical Exam  Blood pressure 145/78, pulse 103, height 5' 1"  (1.549 m), weight 92.987 kg (205 lb), last menstrual period 02/23/2014. GENERAL: Well-developed, well-nourished female in mild distress.  HEART: normal rate, rhythm RESP: normal effort GI: Abd soft, non-tender, gravid appropriate for gestational age. Pos BS x 4 MS: Extremities nontender, no peripheral edema, normal ROM NEURO: Alert and oriented x 4.  DTR's +2 bilateral patellar reflex, no clonus   FHT: Baseline 155, moderate variability, accelerations present, no decelerations Contractions: none  Labs:  Lab Results Last 24 Hours    Results for orders placed or performed during the hospital encounter of 08/09/14 (from the past 24 hour(s))  CBC Status: Abnormal   Collection Time: 08/09/14 12:13 PM  Result Value Ref Range   WBC 8.6 4.0 - 10.5 K/uL   RBC 4.12 3.87 - 5.11 MIL/uL   Hemoglobin 11.2 (L) 12.0 - 15.0 g/dL   HCT 34.4 (L) 36.0 - 46.0 %   MCV 83.5 78.0 - 100.0 fL   MCH 27.2 26.0 - 34.0 pg   MCHC 32.6 30.0 - 36.0 g/dL   RDW 14.3 11.5 - 15.5 %   Platelets 312 150 - 400 K/uL  Comprehensive metabolic panel Status: Abnormal   Collection Time: 08/09/14 12:13 PM  Result Value Ref Range   Sodium 130 (L) 135 - 145 mmol/L   Potassium 3.8 3.5 - 5.1 mmol/L   Chloride 98 96 - 112 mmol/L   CO2 22 19 - 32 mmol/L   Glucose, Bld 275 (H) 70 - 99 mg/dL   BUN 7 6 - 23 mg/dL   Creatinine, Ser 0.56 0.50 - 1.10 mg/dL   Calcium 8.9 8.4 - 10.5 mg/dL   Total Protein 7.1 6.0 - 8.3 g/dL   Albumin 3.0  (L) 3.5 - 5.2 g/dL   AST 10 0 - 37 U/L   ALT 10 0 - 35 U/L   Alkaline Phosphatase 99 39 - 117 U/L   Total Bilirubin 0.2 (L) 0.3 - 1.2 mg/dL   GFR calc non Af Amer >90 >90 mL/min   GFR calc Af Amer >90 >90 mL/min   Anion gap 10 5 - 15  Uric acid Status: None   Collection Time: 08/09/14 12:13 PM  Result Value Ref Range   Uric Acid, Serum 2.9 2.4 - 7.0 mg/dL  Glucose, capillary Status: Abnormal   Collection Time: 08/09/14 12:19 PM  Result Value Ref Range   Glucose-Capillary 259 (H) 70 - 99 mg/dL      Imaging:  None  MAU Course: MDM   Assessment: 1. Headache: likely due to migraine based on hx and presentation. 2. Rule out preE/gHTN:  pt had one slightly elevated BP follow up with serial BP, U/C ratio, CMP, CBC, UA to rule out preE.  Pt has normal serial BP and labs. 3. Uncontrolled T2DM: finger stick 259 (ate steak and cheese sandwich prior to arrival to MAU)  Plan: Given Fioricet 1x inpatient for headache Given Zofran 1x inpatient for nausea  Uncontrolled T2DM, pt education and follow up in OP prenatal visit D/c with Fioricet and Zofran Discharge home in stable condition.  Labor precautions and fetal kick counts     Medication List    ASK your doctor about these medications       ACCU-CHEK FASTCLIX LANCETS Misc  Check sugar up to 6 x daily     ACCU-CHEK NANO SMARTVIEW W/DEVICE Kit  Use up to 6 times daily to test blood sugars     acetaminophen 325 MG tablet  Commonly known as: TYLENOL  Take 325 mg by mouth every 6 (six) hours as needed for mild pain or headache.     aspirin EC 81 MG tablet  Take 1 tablet (81 mg total) by mouth daily. Take after 12 weeks for prevention of preeclampssia later in pregnancy     cyclobenzaprine 5 MG tablet  Commonly known as: FLEXERIL  Take 1 tablet (5 mg total) by mouth 3 (three) times daily as needed for muscle spasms.     glimepiride  4 MG tablet  Commonly known as: AMARYL  Take 1.5 tablets (6 mg total) by mouth daily before breakfast.     glucose blood test strip  Commonly known as: ACCU-CHEK SMARTVIEW  Check sugar 6 x daily     prenatal vitamin w/FE, FA 27-1 MG Tabs tablet  Take 1 tablet by mouth daily at 12 noon.     promethazine 25 MG tablet  Commonly known as: PHENERGAN  Take 1 tablet (25 mg total) by mouth every 6 (six) hours as needed for nausea or vomiting.     sitaGLIPtin 100 MG tablet  Commonly known as: JANUVIA  Take 1 tablet (100 mg total) by mouth daily.           Cecille Rubin Seon Gaertner CNM 08/09/2014 2:17pm

## 2014-08-09 NOTE — Discharge Instructions (Signed)

## 2014-08-09 NOTE — MAU Note (Signed)
Pt. Urine in lab 

## 2014-08-09 NOTE — MAU Note (Cosign Needed)
Chief Complaint:  Headache   First Provider Initiated Contact with Patient 08/09/14 1147     HPI: [name] [name] is a 29 y.o. G4P1021 at 56w1dby LMP with a hx of T2DM and gHTN presents to maternity admissions reporting headache.  Headache started Monday night, pt describes continuous throbbing pain in the forehead area, pain score 10/10.  This is the first time pt has experienced such headache, she used acetaminophen with no relief and activity/turning head makes it worse, pt reports vomiting this morning.  Pt reports no scotoma, no upper quadrant abdominal pain, and no swelling. Denies contractions, leakage of fluid or vaginal bleeding. Good fetal movement.   Pregnancy Course:   Past Medical History: Past Medical History  Diagnosis Date  . Hypertension   . Herpes simplex without mention of complication   . Obesity   . Diabetes mellitus     type II  . Irregular menses   . Abnormal Pap smear   . GBS (group B streptococcus) UTI complicating pregnancy     Past obstetric history: OB History  Gravida Para Term Preterm AB SAB TAB Ectopic Multiple Living  4 1 1  2 2    1     # Outcome Date GA Lbr Len/2nd Weight Sex Delivery Anes PTL Lv  4 Current           3 Term 01/30/11 380w6d M Berenice BoutonPI  Y  2 SAB 09/11/01 [id number]    N  1 SAB               Past Surgical History: Past Surgical History  Procedure Laterality Date  . Cesarean section  01/30/2011    Procedure: CESAREAN SECTION;  Surgeon: JohJonnie KindD;  Location: [facility];  Service: Gynecology;  Laterality: N/A;     Family History: Family History  Problem Relation Age of Onset  . Diabetes Paternal Grandmother   . Hypertension Paternal Grandmother   . Diabetes Maternal Grandmother   . Diabetes Father   . Cancer Paternal Uncle     Social History: History  Substance Use Topics  . Smoking status: Never Smoker   . Smokeless tobacco: Never Used  . Alcohol Use: No    Allergies: No Known Allergies  Meds:   Prescriptions prior to admission  Medication Sig Dispense Refill Last Dose  . acetaminophen (TYLENOL) 325 MG tablet Take 325 mg by mouth every 6 (six) hours as needed for mild pain or headache.   08/09/2014 at Unknown time  . aspirin EC 81 MG tablet Take 1 tablet (81 mg total) by mouth daily. Take after 12 weeks for prevention of preeclampssia later in pregnancy 300 tablet 2 08/09/2014 at Unknown time  . glimepiride (AMARYL) 4 MG tablet Take 1.5 tablets (6 mg total) by mouth daily before breakfast. 45 tablet 6 08/09/2014 at Unknown time  . prenatal vitamin w/FE, FA (PRENATAL 1 + 1) 27-1 MG TABS tablet Take 1 tablet by mouth daily at 12 noon.   08/08/2014 at Unknown time  . sitaGLIPtin (JANUVIA) 100 MG tablet Take 1 tablet (100 mg total) by mouth daily. 30 tablet 6 08/09/2014 at Unknown time  . ACCU-CHEK FASTCLIX LANCETS MISC Check sugar up to 6 x daily 204 each 3 Taking  . Blood Glucose Monitoring Suppl (ACCU-CHEK NANO SMARTVIEW) W/DEVICE KIT Use up to 6 times daily to test blood sugars 1 kit 0 Taking  . cyclobenzaprine (FLEXERIL) 5 MG tablet Take 1 tablet (5 mg total)  by mouth 3 (three) times daily as needed for muscle spasms. (Patient not taking: Reported on 08/09/2014) 30 tablet 0 Taking  . glucose blood (ACCU-CHEK SMARTVIEW) test strip Check sugar 6 x daily 200 each 3 Taking  . promethazine (PHENERGAN) 25 MG tablet Take 1 tablet (25 mg total) by mouth every 6 (six) hours as needed for nausea or vomiting. (Patient not taking: Reported on 08/09/2014) 30 tablet 1 Taking    ROS:  Review of Systems  Constitutional: Negative for fever and chills.  HENT: Negative for ear pain.        Phonophobia  Eyes: Positive for photophobia. Negative for blurred vision and double vision.  Respiratory: Negative for cough.   Cardiovascular: Negative for chest pain and palpitations.  Gastrointestinal: Positive for heartburn, nausea and vomiting.       N/V this morning  Genitourinary: Negative for dysuria and  hematuria.  Neurological: Positive for headaches. Negative for dizziness and tingling.  Psychiatric/Behavioral: Negative.     Physical Exam  Blood pressure 145/78, pulse 103, height 5' 1"  (1.549 m), weight 92.987 kg (205 lb), last menstrual period 02/23/2014. GENERAL: Well-developed, well-nourished female in mild distress.  HEART: normal rate, rhythm RESP: normal effort GI: Abd soft, non-tender, gravid appropriate for gestational age. Pos BS x 4 MS: Extremities nontender, no peripheral edema, normal ROM NEURO: Alert and oriented x 4.  DTR's +2 bilateral patellar reflex, no clonus   FHT:  Baseline 155, moderate variability, accelerations present, no decelerations Contractions: none   Labs: Results for orders placed or performed during the hospital encounter of 08/09/14 (from the past 24 hour(s))  CBC     Status: Abnormal   Collection Time: 08/09/14 12:13 PM  Result Value Ref Range   WBC 8.6 4.0 - 10.5 K/uL   RBC 4.12 3.87 - 5.11 MIL/uL   Hemoglobin 11.2 (L) 12.0 - 15.0 g/dL   HCT 34.4 (L) 36.0 - 46.0 %   MCV 83.5 78.0 - 100.0 fL   MCH 27.2 26.0 - 34.0 pg   MCHC 32.6 30.0 - 36.0 g/dL   RDW 14.3 11.5 - 15.5 %   Platelets 312 150 - 400 K/uL  Comprehensive metabolic panel     Status: Abnormal   Collection Time: 08/09/14 12:13 PM  Result Value Ref Range   Sodium 130 (L) 135 - 145 mmol/L   Potassium 3.8 3.5 - 5.1 mmol/L   Chloride 98 96 - 112 mmol/L   CO2 22 19 - 32 mmol/L   Glucose, Bld 275 (H) 70 - 99 mg/dL   BUN 7 6 - 23 mg/dL   Creatinine, Ser 0.56 0.50 - 1.10 mg/dL   Calcium 8.9 8.4 - 10.5 mg/dL   Total Protein 7.1 6.0 - 8.3 g/dL   Albumin 3.0 (L) 3.5 - 5.2 g/dL   AST 10 0 - 37 U/L   ALT 10 0 - 35 U/L   Alkaline Phosphatase 99 39 - 117 U/L   Total Bilirubin 0.2 (L) 0.3 - 1.2 mg/dL   GFR calc non Af Amer >90 >90 mL/min   GFR calc Af Amer >90 >90 mL/min   Anion gap 10 5 - 15  Uric acid     Status: None   Collection Time: 08/09/14 12:13 PM  Result Value Ref Range    Uric Acid, Serum 2.9 2.4 - 7.0 mg/dL  Glucose, capillary     Status: Abnormal   Collection Time: 08/09/14 12:19 PM  Result Value Ref Range   Glucose-Capillary 259 (H) 70 -  99 mg/dL    Imaging:  None  MAU Course: MDM   Assessment: 1. Headache: likely due to migraine based on hx and presentation. 2. Rule out preE/gHTN: pt had one slightly elevated BP follow up with serial BP, U/C ratio, CMP, CBC, UA to rule out preE.     Pt has normal serial BP and labs. 3. Uncontrolled T2DM: finger stick 259  Plan: Given Fioricet 1x inpatient for headache Given Zofran 1x inpatient for nausea  Uncontrolled T2DM, pt education and follow up in OP prenatal visit D/c with Fioricet and Zofran Discharge home in stable condition.  Labor precautions and fetal kick counts     Medication List    ASK your doctor about these medications        ACCU-CHEK FASTCLIX LANCETS Misc  Check sugar up to 6 x daily     ACCU-CHEK NANO SMARTVIEW W/DEVICE Kit  Use up to 6 times daily to test blood sugars     acetaminophen 325 MG tablet  Commonly known as:  TYLENOL  Take 325 mg by mouth every 6 (six) hours as needed for mild pain or headache.     aspirin EC 81 MG tablet  Take 1 tablet (81 mg total) by mouth daily. Take after 12 weeks for prevention of preeclampssia later in pregnancy     cyclobenzaprine 5 MG tablet  Commonly known as:  FLEXERIL  Take 1 tablet (5 mg total) by mouth 3 (three) times daily as needed for muscle spasms.     glimepiride 4 MG tablet  Commonly known as:  AMARYL  Take 1.5 tablets (6 mg total) by mouth daily before breakfast.     glucose blood test strip  Commonly known as:  ACCU-CHEK SMARTVIEW  Check sugar 6 x daily     prenatal vitamin w/FE, FA 27-1 MG Tabs tablet  Take 1 tablet by mouth daily at 12 noon.     promethazine 25 MG tablet  Commonly known as:  PHENERGAN  Take 1 tablet (25 mg total) by mouth every 6 (six) hours as needed for nausea or vomiting.     sitaGLIPtin 100  MG tablet  Commonly known as:  JANUVIA  Take 1 tablet (100 mg total) by mouth daily.        Lissa Hoard, Med Student 08/09/2014 11:48 AM

## 2014-08-09 NOTE — MAU Note (Signed)
Has had a headache since Monday, rollover from [facility]- sent for further eval.

## 2014-08-14 ENCOUNTER — Ambulatory Visit (INDEPENDENT_AMBULATORY_CARE_PROVIDER_SITE_OTHER): Payer: Medicaid Other | Admitting: Obstetrics and Gynecology

## 2014-08-14 ENCOUNTER — Encounter: Payer: Medicaid Other | Attending: Obstetrics and Gynecology | Admitting: *Deleted

## 2014-08-14 VITALS — BP 104/71 | HR 99 | Temp 98.1°F | Wt 204.7 lb

## 2014-08-14 DIAGNOSIS — O3421 Maternal care for scar from previous cesarean delivery: Secondary | ICD-10-CM

## 2014-08-14 DIAGNOSIS — Z713 Dietary counseling and surveillance: Secondary | ICD-10-CM | POA: Insufficient documentation

## 2014-08-14 DIAGNOSIS — O360120 Maternal care for anti-D [Rh] antibodies, second trimester, not applicable or unspecified: Secondary | ICD-10-CM

## 2014-08-14 DIAGNOSIS — Z23 Encounter for immunization: Secondary | ICD-10-CM

## 2014-08-14 DIAGNOSIS — O24913 Unspecified diabetes mellitus in pregnancy, third trimester: Secondary | ICD-10-CM

## 2014-08-14 DIAGNOSIS — E119 Type 2 diabetes mellitus without complications: Secondary | ICD-10-CM

## 2014-08-14 DIAGNOSIS — I10 Essential (primary) hypertension: Secondary | ICD-10-CM

## 2014-08-14 DIAGNOSIS — O0992 Supervision of high risk pregnancy, unspecified, second trimester: Secondary | ICD-10-CM

## 2014-08-14 DIAGNOSIS — O10912 Unspecified pre-existing hypertension complicating pregnancy, second trimester: Secondary | ICD-10-CM

## 2014-08-14 DIAGNOSIS — O0991 Supervision of high risk pregnancy, unspecified, first trimester: Secondary | ICD-10-CM

## 2014-08-14 DIAGNOSIS — O24912 Unspecified diabetes mellitus in pregnancy, second trimester: Secondary | ICD-10-CM

## 2014-08-14 DIAGNOSIS — O34219 Maternal care for unspecified type scar from previous cesarean delivery: Secondary | ICD-10-CM

## 2014-08-14 LAB — POCT URINALYSIS DIP (DEVICE)
BILIRUBIN URINE: NEGATIVE
GLUCOSE, UA: 500 mg/dL — AB
Hgb urine dipstick: NEGATIVE
Ketones, ur: >=160 mg/dL — AB
Leukocytes, UA: NEGATIVE
NITRITE: NEGATIVE
Protein, ur: NEGATIVE mg/dL
Specific Gravity, Urine: 1.02 (ref 1.005–1.030)
UROBILINOGEN UA: 0.2 mg/dL (ref 0.0–1.0)
pH: 7 (ref 5.0–8.0)

## 2014-08-14 LAB — GLUCOSE, CAPILLARY: Glucose-Capillary: 187 mg/dL — ABNORMAL HIGH (ref 70–99)

## 2014-08-14 MED ORDER — INSULIN REGULAR HUMAN 100 UNIT/ML IJ SOLN: INTRAMUSCULAR | Status: DC

## 2014-08-14 MED ORDER — RHO D IMMUNE GLOBULIN 1500 UNIT/2ML IJ SOSY
300.0000 ug | PREFILLED_SYRINGE | Freq: Once | INTRAMUSCULAR | Status: AC
  Administered 2014-08-14: 300 ug via INTRAMUSCULAR

## 2014-08-14 MED ORDER — INSULIN NPH (HUMAN) (ISOPHANE) 100 UNIT/ML ~~LOC~~ SUSP: SUBCUTANEOUS | Status: DC

## 2014-08-14 MED ORDER — TETANUS-DIPHTH-ACELL PERTUSSIS 5-2.5-18.5 LF-MCG/0.5 IM SUSP
0.5000 mL | Freq: Once | INTRAMUSCULAR | Status: AC
  Administered 2014-08-14: 0.5 mL via INTRAMUSCULAR

## 2014-08-14 MED ORDER — "INSULIN SYRINGE 31G X 5/16"" 1 ML MISC": 100.0000 | Freq: Four times a day (QID) | Status: DC

## 2014-08-14 NOTE — Progress Notes (Signed)
28 y.o. [id number] at 4153w6d. Feeling well today. Complains of back pain, but otherwise doing well.  1. T2DM. Did not bring blood glucose log for review. Continues to take Januvia 100 mg daily and amaryl 6 mg daily. Discussed with pharmacy and both medications are pregnancy category B although not many studies have been done, so alternative agents are recommended. Fasting glucose 187 today. Urinalysis with 3+ glucose and +ketones. Clearly control is not ideal. Will plan to discontinue oral medications and start insulin - weight based dosing. Patient to see diabetes educator today for insulin teaching and determination of dosing. Patient expresses understanding. 2. Rh neg. Rhogam today.  3. Routine PNC. Labs reviewed. Tdap today.

## 2014-08-14 NOTE — Progress Notes (Signed)
Patient has positive ketones/

## 2014-08-14 NOTE — Progress Notes (Signed)
DIABETES: Patient noted that she was on insulin with previous pregnancy. She understand that she will stop all PO meds. She will take her first dose of NPH at HS this date. Breakffast - 10:00 - NPH 42units, R-21units, Dinner R-15 units, Bed NPH 15 units. Pt verbalizes understanding. FBS 187 this date.

## 2014-08-14 NOTE — Progress Notes (Signed)
Patient reports constant pelvic and back pain

## 2014-08-15 ENCOUNTER — Telehealth: Payer: Self-pay

## 2014-08-15 NOTE — Telephone Encounter (Signed)
Patient called requesting a call back. Attempted to contact patient. No answer. Left message stating we are returning your call please call clinic.

## 2014-08-17 ENCOUNTER — Other Ambulatory Visit (HOSPITAL_COMMUNITY): Payer: Self-pay | Admitting: Obstetrics and Gynecology

## 2014-08-17 ENCOUNTER — Inpatient Hospital Stay (HOSPITAL_COMMUNITY)
Admission: AD | Admit: 2014-08-17 | Discharge: 2014-08-17 | Disposition: A | Discharge status: Home / Self Care | Payer: Medicaid Other | Source: Ambulatory Visit | Attending: Obstetrics & Gynecology | Admitting: Obstetrics & Gynecology

## 2014-08-17 ENCOUNTER — Encounter (HOSPITAL_COMMUNITY): Payer: Self-pay

## 2014-08-17 DIAGNOSIS — O24112 Pre-existing diabetes mellitus, type 2, in pregnancy, second trimester: Secondary | ICD-10-CM

## 2014-08-17 DIAGNOSIS — M62830 Muscle spasm of back: Secondary | ICD-10-CM

## 2014-08-17 DIAGNOSIS — O99212 Obesity complicating pregnancy, second trimester: Secondary | ICD-10-CM

## 2014-08-17 DIAGNOSIS — B009 Herpesviral infection, unspecified: Secondary | ICD-10-CM

## 2014-08-17 DIAGNOSIS — O09299 Supervision of pregnancy with other poor reproductive or obstetric history, unspecified trimester: Secondary | ICD-10-CM

## 2014-08-17 DIAGNOSIS — Z98891 History of uterine scar from previous surgery: Secondary | ICD-10-CM

## 2014-08-17 DIAGNOSIS — R109 Unspecified abdominal pain: Secondary | ICD-10-CM | POA: Diagnosis present

## 2014-08-17 DIAGNOSIS — O360190 Maternal care for anti-D [Rh] antibodies, unspecified trimester, not applicable or unspecified: Secondary | ICD-10-CM

## 2014-08-17 DIAGNOSIS — E119 Type 2 diabetes mellitus without complications: Secondary | ICD-10-CM

## 2014-08-17 DIAGNOSIS — E1165 Type 2 diabetes mellitus with hyperglycemia: Secondary | ICD-10-CM | POA: Insufficient documentation

## 2014-08-17 DIAGNOSIS — O359XX0 Maternal care for (suspected) fetal abnormality and damage, unspecified, not applicable or unspecified: Secondary | ICD-10-CM

## 2014-08-17 DIAGNOSIS — Z794 Long term (current) use of insulin: Secondary | ICD-10-CM

## 2014-08-17 DIAGNOSIS — O34219 Maternal care for unspecified type scar from previous cesarean delivery: Secondary | ICD-10-CM

## 2014-08-17 DIAGNOSIS — O9989 Other specified diseases and conditions complicating pregnancy, childbirth and the puerperium: Secondary | ICD-10-CM | POA: Diagnosis not present

## 2014-08-17 DIAGNOSIS — Z3A27 27 weeks gestation of pregnancy: Secondary | ICD-10-CM | POA: Insufficient documentation

## 2014-08-17 LAB — URINE MICROSCOPIC-ADD ON

## 2014-08-17 LAB — URINALYSIS, ROUTINE W REFLEX MICROSCOPIC
Bilirubin Urine: NEGATIVE
Glucose, UA: 100 mg/dL — AB
HGB URINE DIPSTICK: NEGATIVE
Ketones, ur: NEGATIVE mg/dL
NITRITE: NEGATIVE
Protein, ur: NEGATIVE mg/dL
SPECIFIC GRAVITY, URINE: 1.02 (ref 1.005–1.030)
UROBILINOGEN UA: 1 mg/dL (ref 0.0–1.0)
pH: 6 (ref 5.0–8.0)

## 2014-08-17 LAB — GLUCOSE, CAPILLARY: GLUCOSE-CAPILLARY: 83 mg/dL (ref 70–99)

## 2014-08-17 LAB — FETAL FIBRONECTIN: Fetal Fibronectin: NEGATIVE

## 2014-08-17 MED ORDER — CYCLOBENZAPRINE HCL 10 MG PO TABS
10.0000 mg | ORAL_TABLET | Freq: Once | ORAL | Status: AC
  Administered 2014-08-17: 10 mg via ORAL
  Filled 2014-08-17: qty 1

## 2014-08-17 MED ORDER — CYCLOBENZAPRINE HCL 10 MG PO TABS: 10.0000 mg | ORAL_TABLET | Freq: Once | ORAL | Status: DC

## 2014-08-17 NOTE — Discharge Instructions (Signed)
Back Exercises °Back exercises help treat and prevent back injuries. The goal of back exercises is to increase the strength of your abdominal and back muscles and the flexibility of your back. These exercises should be started when you no longer have back pain. Back exercises include: °· Pelvic Tilt. Lie on your back with your knees bent. Tilt your pelvis until the lower part of your back is against the floor. Hold this position 5 to 10 sec and repeat 5 to 10 times. °· Knee to Chest. Pull first 1 knee up against your chest and hold for 20 to 30 seconds, repeat this with the other knee, and then both knees. This may be done with the other leg straight or bent, whichever feels better. °· Sit-Ups or Curl-Ups. Bend your knees 90 degrees. Start with tilting your pelvis, and do a partial, slow sit-up, lifting your trunk only 30 to 45 degrees off the floor. Take at least 2 to 3 seconds for each sit-up. Do not do sit-ups with your knees out straight. If partial sit-ups are difficult, simply do the above but with only tightening your abdominal muscles and holding it as directed. °· Hip-Lift. Lie on your back with your knees flexed 90 degrees. Push down with your feet and shoulders as you raise your hips a couple inches off the floor; hold for 10 seconds, repeat 5 to 10 times. °· Back arches. Lie on your stomach, propping yourself up on bent elbows. Slowly press on your hands, causing an arch in your low back. Repeat 3 to 5 times. Any initial stiffness and discomfort should lessen with repetition over time. °· Shoulder-Lifts. Lie face down with arms beside your body. Keep hips and torso pressed to floor as you slowly lift your head and shoulders off the floor. °Do not overdo your exercises, especially in the beginning. Exercises may cause you some mild back discomfort which lasts for a few minutes; however, if the pain is more severe, or lasts for more than 15 minutes, do not continue exercises until you see your caregiver.  Improvement with exercise therapy for back problems is slow.  °See your caregivers for assistance with developing a proper back exercise program. °Document Released: 06/05/2004 Document Revised: 07/21/2011 Document Reviewed: 02/27/2011 °[employer]® Patient Information ©2015 [employer]. This information is not intended to replace advice given to you by your health care provider. Make sure you discuss any questions you have with your health care provider. °Back Pain in Pregnancy °Back pain during pregnancy is common. It happens in about half of all pregnancies. It is important for you and your baby that you remain active during your pregnancy. If you feel that back pain is not allowing you to remain active or sleep well, it is time to see your caregiver. Back pain may be caused by several factors related to changes during your pregnancy. Fortunately, unless you had trouble with your back before your pregnancy, the pain is likely to get better after you deliver. °Low back pain usually occurs between the fifth and seventh months of pregnancy. It can, however, happen in the first couple months. Factors that increase the risk of back problems include:  °· Previous back problems. °· Injury to your back. °· Having twins or multiple births. °· A chronic cough. °· Stress. °· Job-related repetitive motions. °· Muscle or spinal disease in the back. °· Family history of back problems, ruptured (herniated) discs, or osteoporosis. °· Depression, anxiety, and panic attacks. °CAUSES  °· When you are pregnant, your body   produces a hormone called relaxin. This hormone makes the ligaments connecting the low back and pubic bones more flexible. This flexibility allows the baby to be delivered more easily. When your ligaments are loose, your muscles need to work harder to support your back. Soreness in your back can come from tired muscles. Soreness can also come from back tissues that are irritated since they are receiving less  support. °· As the baby grows, it puts pressure on the nerves and blood vessels in your pelvis. This can cause back pain. °· As the baby grows and gets heavier during pregnancy, the uterus pushes the stomach muscles forward and changes your center of gravity. This makes your back muscles work harder to maintain good posture. °SYMPTOMS  °Lumbar pain during pregnancy °Lumbar pain during pregnancy usually occurs at or above the waist in the center of the back. There may be pain and numbness that radiates into your leg or foot. This is similar to low back pain experienced by non-pregnant women. It usually increases with sitting for long periods of time, standing, or repetitive lifting. Tenderness may also be present in the muscles along your upper back. °Posterior pelvic pain during pregnancy °Pain in the back of the pelvis is more common than lumbar pain in pregnancy. It is a deep pain felt in your side at the waistline, or across the tailbone (sacrum), or in both places. You may have pain on one or both sides. This pain can also go into the buttocks and backs of the upper thighs. Pubic and groin pain may also be present. The pain does not quickly resolve with rest, and morning stiffness may also be present. °Pelvic pain during pregnancy can be brought on by most activities. A high level of fitness before and during pregnancy may or may not prevent this problem. Labor pain is usually 1 to 2 minutes apart, lasts for about 1 minute, and involves a bearing down feeling or pressure in your pelvis. However, if you are at term with the pregnancy, constant low back pain can be the beginning of early labor, and you should be aware of this. °DIAGNOSIS  °X-rays of the back should not be done during the first 12 to 14 weeks of the pregnancy and only when absolutely necessary during the rest of the pregnancy. MRIs do not give off radiation and are safe during pregnancy. MRIs also should only be done when absolutely  necessary. °HOME CARE INSTRUCTIONS °· Exercise as directed by your caregiver. Exercise is the most effective way to prevent or manage back pain. If you have a back problem, it is especially important to avoid sports that require sudden body movements. Swimming and walking are great activities. °· Do not stand in one place for long periods of time. °· Do not wear high heels. °· Sit in chairs with good posture. Use a pillow on your lower back if necessary. Make sure your head rests over your shoulders and is not hanging forward. °· Try sleeping on your side, preferably the left side, with a pillow or two between your legs. If you are sore after a night's rest, your bed may be too soft. Try placing a board between your mattress and box spring. °· Listen to your body when lifting. If you are experiencing pain, ask for help or try bending your knees more so you can use your leg muscles rather than your back muscles. Squat down when picking up something from the floor. Do not bend over. °· Eat a healthy diet. Try to gain   weight within your caregiver's recommendations. °· Use heat or cold packs 3 to 4 times a day for 15 minutes to help with the pain. °· Only take over-the-counter or prescription medicines for pain, discomfort, or fever as directed by your caregiver. °Sudden (acute) back pain °· Use bed rest for only the most extreme, acute episodes of back pain. Prolonged bed rest over 48 hours will aggravate your condition. °· Ice is very effective for acute conditions. °¨ Put ice in a plastic bag. °¨ Place a towel between your skin and the bag. °¨ Leave the ice on for 10 to 20 minutes every 2 hours, or as needed. °· Using heat packs for 30 minutes prior to activities is also helpful. °Continued back pain °See your caregiver if you have continued problems. Your caregiver can help or refer you for appropriate physical therapy. With conditioning, most back problems can be avoided. Sometimes, a more serious issue may be the  cause of back pain. You should be seen right away if new problems seem to be developing. Your caregiver may recommend: °· A maternity girdle. °· An elastic sling. °· A back brace. °· A massage therapist or acupuncture. °SEEK MEDICAL CARE IF:  °· You are not able to do most of your daily activities, even when taking the pain medicine you were given. °· You need a referral to a physical therapist or chiropractor. °· You want to try acupuncture. °SEEK IMMEDIATE MEDICAL CARE IF: °· You develop numbness, tingling, weakness, or problems with the use of your arms or legs. °· You develop severe back pain that is no longer relieved with medicines. °· You have a sudden change in bowel or bladder control. °· You have increasing pain in other areas of the body. °· You develop shortness of breath, dizziness, or fainting. °· You develop nausea, vomiting, or sweating. °· You have back pain which is similar to labor pains. °· You have back pain along with your water breaking or vaginal bleeding. °· You have back pain or numbness that travels down your leg. °· Your back pain developed after you fell. °· You develop pain on one side of your back. You may have a kidney stone. °· You see blood in your urine. You may have a bladder infection or kidney stone. °· You have back pain with blisters. You may have shingles. °Back pain is fairly common during pregnancy but should not be accepted as just part of the process. Back pain should always be treated as soon as possible. This will make your pregnancy as pleasant as possible. °Document Released: 08/06/2005 Document Revised: 07/21/2011 Document Reviewed: 09/17/2010 °[employer]® Patient Information ©2015 [employer]. This information is not intended to replace advice given to you by your health care provider. Make sure you discuss any questions you have with your health care provider. ° °

## 2014-08-17 NOTE — MAU Provider Note (Signed)
None     Chief Complaint:  Abdominal Cramping   [name] [name] is  29 y.o. [phone] at 10w2dpresents complaining of Abdominal Cramping She is a poorly controlled Type 2 diabetic, and was changed from oral meds to insulin on Tuesday.  She had blood sugars in the 200's "all day", and has had some vomiting.  Since vomiting, she has noticed lower abdominal cramping off and on and constant back pain. She has had the back pain for "weeks", but tonight she has waves where it hurts a lot more  Obstetrical/Gynecological History: OB History    Gravida Para Term Preterm AB TAB SAB Ectopic Multiple Living   _0 Past Medical History: Past Medical History  Diagnosis Date  . Hypertension   . Herpes simplex without mention of complication   . Obesity   . Diabetes mellitus     type II  . Irregular menses   . Abnormal Pap smear   . GBS (group B streptococcus) UTI complicating pregnancy     Past Surgical History: Past Surgical History  Procedure Laterality Date  . Cesarean section  01/30/2011    Procedure: CESAREAN SECTION;  Surgeon: JJonnie Kind MD;  [facility];  Service: Gynecology;  Laterality: N/A;    Family History: Family History  Problem Relation Age of Onset  . Diabetes Paternal Grandmother   . Hypertension Paternal Grandmother   . Diabetes Maternal Grandmother   . Diabetes Father   . Cancer Paternal Uncle     Social History: History  Substance Use Topics  . Smoking status: Never Smoker   . Smokeless tobacco: Never Used  . Alcohol Use: No    Allergies: No Known Allergies  Meds:  Prescriptions prior to admission  Medication Sig Dispense Refill Last Dose  . acetaminophen (TYLENOL) 325 MG tablet Take 325 mg by mouth every 6 (six) hours as needed for mild pain or headache.   Past Week at Unknown time  . butalbital-acetaminophen-caffeine (FIORICET) 50-325-40 MG per tablet Take 1-2 tablets by mouth every 6 (six) hours as needed for headache. 20  tablet 0 Past Week at Unknown time  . insulin NPH Human (HUMULIN N,NOVOLIN N) 100 UNIT/ML injection 42units at Breakfast , 15 units at HS Provide pen if available 10 mL 3 08/17/2014 at Unknown time  . insulin regular (HUMULIN R) 100 units/mL injection Breakfast 21 units, Dinner 15 units 10 mL 11 08/17/2014 at Unknown time  . ondansetron (ZOFRAN) 4 MG tablet Take 1 tablet (4 mg total) by mouth daily as needed for nausea or vomiting. 30 tablet 1 08/16/2014 at Unknown time  . Prenatal Vit-Fe Fumarate-FA (PRENATAL MULTIVITAMIN) TABS tablet Take 1 tablet by mouth daily at 12 noon.   08/16/2014 at Unknown time  . ACCU-CHEK FASTCLIX LANCETS MISC Check sugar up to 6 x daily 204 each 3 Taking  . Blood Glucose Monitoring Suppl (ACCU-CHEK NANO SMARTVIEW) W/DEVICE KIT Use up to 6 times daily to test blood sugars 1 kit 0 Taking  . glimepiride (AMARYL) 4 MG tablet Take 1.5 tablets (6 mg total) by mouth daily before breakfast. (Patient not taking: Reported on 08/17/2014) 45 tablet 6 Not Taking at Unknown time  . glucose blood (ACCU-CHEK SMARTVIEW) test strip Check sugar 6 x daily 200 each 3 Taking  . Insulin Syringe-Needle U-100 (INSULIN SYRINGE 1CC/31GX5/16") 31G X 5/16" 1 ML MISC 100 each by Does not apply route 4 (four) times daily. 100  each 0   . sitaGLIPtin (JANUVIA) 100 MG tablet Take 1 tablet (100 mg total) by mouth daily. (Patient not taking: Reported on 08/17/2014) 30 tablet 6 Not Taking at Unknown time    Review of Systems   Constitutional: Negative for fever and chills Eyes: Negative for visual disturbances Respiratory: Negative for shortness of breath, dyspnea Cardiovascular: Negative for chest pain or palpitations  Gastrointestinal: Negative for diarrhea and constipation Genitourinary: Negative for dysuria and urgency Musculoskeletal: Negative for joint pain, myalgias  Neurological: Negative for dizziness and headaches   Physical Exam  Blood pressure 129/72, pulse 112, temperature 97.7 F (36.5 C),  temperature source Oral, resp. rate 18, height _0  (1.575 m), weight 93.895 kg (207 lb), last menstrual period 02/23/2014, SpO2 100 %. GENERAL: Well-developed, well-nourished female in no acute distress.  LUNGS: Clear to auscultation bilaterally.  HEART: Regular rate and rhythm. ABDOMEN: Soft, nontender, nondistended, gravid.  EXTREMITIES: Nontender, no edema, 2+ distal pulses. DTR's 2+ CERVICAL EXAM: Dilatation 0cm   Effacement 0%   Presentation: undetermined FHT:  Baseline rate 145 bpm   Variability moderate  Accelerations present   Decelerations none Contractions: Every 0 mins   Labs: Results for orders placed or performed during the hospital encounter of 08/17/14 (from the past 24 hour(s))  Urinalysis, Routine w reflex microscopic   Collection Time: 08/17/14  9:07 PM  Result Value Ref Range   Color, Urine YELLOW YELLOW   APPearance CLEAR CLEAR   Specific Gravity, Urine 1.020 1.005 - 1.030   pH 6.0 5.0 - 8.0   Glucose, UA 100 (A) NEGATIVE mg/dL   Hgb urine dipstick NEGATIVE NEGATIVE   Bilirubin Urine NEGATIVE NEGATIVE   Ketones, ur NEGATIVE NEGATIVE mg/dL   Protein, ur NEGATIVE NEGATIVE mg/dL   Urobilinogen, UA 1.0 0.0 - 1.0 mg/dL   Nitrite NEGATIVE NEGATIVE   Leukocytes, UA SMALL (A) NEGATIVE  Urine microscopic-add on   Collection Time: 08/17/14  9:07 PM  Result Value Ref Range   Squamous Epithelial / LPF FEW (A) RARE   WBC, UA 3-6 <3 WBC/hpf   RBC / HPF 0-2 <3 RBC/hpf   Bacteria, UA FEW (A) RARE   Urine-Other MUCOUS PRESENT   Glucose, capillary   Collection Time: 08/17/14  9:34 PM  Result Value Ref Range   Glucose-Capillary 83 70 - 99 mg/dL  Fetal fibronectin   Collection Time: 08/17/14 10:05 PM  Result Value Ref Range   Fetal Fibronectin NEGATIVE NEGATIVE    Assessment: [name] [name] is  29 y.o. G4P1021 at 96w2dpresents with back pain/spasms.  Plan: Flexeril/ice Keep record of blood sugars and take them to appt 4/11 as  scheduled  [name],[name] Clements 4/7/201610:58 PM

## 2014-08-17 NOTE — MAU Note (Signed)
Pt reports her blood sugar has been high all day, in the 200's. Also reports lower abd pain since 6 pm that is off/on and constant lower abd pain.

## 2014-08-21 ENCOUNTER — Ambulatory Visit (INDEPENDENT_AMBULATORY_CARE_PROVIDER_SITE_OTHER): Payer: Medicaid Other | Admitting: Obstetrics & Gynecology

## 2014-08-21 VITALS — BP 129/74 | HR 101 | Temp 98.7°F | Wt 207.8 lb

## 2014-08-21 DIAGNOSIS — L639 Alopecia areata, unspecified: Secondary | ICD-10-CM

## 2014-08-21 DIAGNOSIS — O24913 Unspecified diabetes mellitus in pregnancy, third trimester: Secondary | ICD-10-CM

## 2014-08-21 DIAGNOSIS — E1165 Type 2 diabetes mellitus with hyperglycemia: Secondary | ICD-10-CM

## 2014-08-21 DIAGNOSIS — IMO0002 Reserved for concepts with insufficient information to code with codable children: Secondary | ICD-10-CM

## 2014-08-21 DIAGNOSIS — E119 Type 2 diabetes mellitus without complications: Secondary | ICD-10-CM

## 2014-08-21 DIAGNOSIS — O09893 Supervision of other high risk pregnancies, third trimester: Secondary | ICD-10-CM

## 2014-08-21 LAB — POCT URINALYSIS DIP (DEVICE)
BILIRUBIN URINE: NEGATIVE
Glucose, UA: 100 mg/dL — AB
HGB URINE DIPSTICK: NEGATIVE
Ketones, ur: NEGATIVE mg/dL
Nitrite: NEGATIVE
Protein, ur: NEGATIVE mg/dL
Specific Gravity, Urine: 1.02 (ref 1.005–1.030)
UROBILINOGEN UA: 0.2 mg/dL (ref 0.0–1.0)
pH: 7 (ref 5.0–8.0)

## 2014-08-21 LAB — CBC
HCT: 34.6 % — ABNORMAL LOW (ref 36.0–46.0)
Hemoglobin: 11.2 g/dL — ABNORMAL LOW (ref 12.0–15.0)
MCH: 26.8 pg (ref 26.0–34.0)
MCHC: 32.4 g/dL (ref 30.0–36.0)
MCV: 82.8 fL (ref 78.0–100.0)
MPV: 10.7 fL (ref 8.6–12.4)
PLATELETS: 349 10*3/uL (ref 150–400)
RBC: 4.18 MIL/uL (ref 3.87–5.11)
RDW: 14.6 % (ref 11.5–15.5)
WBC: 7.2 10*3/uL (ref 4.0–10.5)

## 2014-08-21 MED ORDER — INSULIN ASPART 100 UNIT/ML ~~LOC~~ SOLN: SUBCUTANEOUS | Status: DC

## 2014-08-21 MED ORDER — INSULIN NPH (HUMAN) (ISOPHANE) 100 UNIT/ML ~~LOC~~ SUSP: SUBCUTANEOUS | Status: DC

## 2014-08-21 NOTE — Progress Notes (Addendum)
Fasting 116/155/218/112/234/163. 2 hr PP 210-280s. Bedtime 80-112 Will increase dosage of insulin across the board. In morning, will increase to 45N and 25R, then 20R and 20 N at night. Considered adding Metformin, patient reports having GI upset with this in the past.  Will titrate insulin up as needed, switched from regular to Novolog. If still not under good control, may need inpatient admission.  Also consider starting weekly BPP next week if control not improved. Growth scan scheduled on 09/06/14. No other complaints or concerns.  Preterm labor and fetal movement precautions reviewed.

## 2014-08-21 NOTE — Patient Instructions (Signed)
Return to clinic for any obstetric concerns or go to [facility] for evaluation  

## 2014-08-22 LAB — HIV ANTIBODY (ROUTINE TESTING W REFLEX): HIV 1&2 Ab, 4th Generation: NONREACTIVE

## 2014-08-22 LAB — RPR

## 2014-08-28 ENCOUNTER — Encounter: Payer: Medicaid Other | Admitting: Obstetrics and Gynecology

## 2014-08-28 ENCOUNTER — Encounter: Payer: Self-pay | Admitting: Obstetrics & Gynecology

## 2014-08-30 ENCOUNTER — Encounter: Payer: Self-pay | Admitting: General Practice

## 2014-09-04 ENCOUNTER — Ambulatory Visit (INDEPENDENT_AMBULATORY_CARE_PROVIDER_SITE_OTHER): Payer: Medicaid Other | Admitting: Obstetrics and Gynecology

## 2014-09-04 ENCOUNTER — Encounter: Payer: Self-pay | Admitting: Obstetrics and Gynecology

## 2014-09-04 VITALS — BP 108/70 | HR 88 | Temp 98.3°F | Wt 208.7 lb

## 2014-09-04 DIAGNOSIS — E1165 Type 2 diabetes mellitus with hyperglycemia: Secondary | ICD-10-CM

## 2014-09-04 DIAGNOSIS — O99213 Obesity complicating pregnancy, third trimester: Secondary | ICD-10-CM

## 2014-09-04 DIAGNOSIS — B009 Herpesviral infection, unspecified: Secondary | ICD-10-CM

## 2014-09-04 DIAGNOSIS — O360131 Maternal care for anti-D [Rh] antibodies, third trimester, fetus 1: Secondary | ICD-10-CM

## 2014-09-04 DIAGNOSIS — O24913 Unspecified diabetes mellitus in pregnancy, third trimester: Secondary | ICD-10-CM

## 2014-09-04 DIAGNOSIS — O3421 Maternal care for scar from previous cesarean delivery: Secondary | ICD-10-CM

## 2014-09-04 DIAGNOSIS — E669 Obesity, unspecified: Secondary | ICD-10-CM

## 2014-09-04 DIAGNOSIS — O34219 Maternal care for unspecified type scar from previous cesarean delivery: Secondary | ICD-10-CM

## 2014-09-04 DIAGNOSIS — O10913 Unspecified pre-existing hypertension complicating pregnancy, third trimester: Secondary | ICD-10-CM

## 2014-09-04 DIAGNOSIS — O0991 Supervision of high risk pregnancy, unspecified, first trimester: Secondary | ICD-10-CM

## 2014-09-04 DIAGNOSIS — I10 Essential (primary) hypertension: Secondary | ICD-10-CM

## 2014-09-04 DIAGNOSIS — E119 Type 2 diabetes mellitus without complications: Secondary | ICD-10-CM

## 2014-09-04 DIAGNOSIS — IMO0002 Reserved for concepts with insufficient information to code with codable children: Secondary | ICD-10-CM

## 2014-09-04 LAB — POCT URINALYSIS DIP (DEVICE)
BILIRUBIN URINE: NEGATIVE
Glucose, UA: NEGATIVE mg/dL
Hgb urine dipstick: NEGATIVE
Ketones, ur: NEGATIVE mg/dL
NITRITE: NEGATIVE
Protein, ur: NEGATIVE mg/dL
Specific Gravity, Urine: 1.02 (ref 1.005–1.030)
UROBILINOGEN UA: 2 mg/dL — AB (ref 0.0–1.0)
pH: 7 (ref 5.0–8.0)

## 2014-09-04 NOTE — Progress Notes (Signed)
Patient is doing well without complaints. She forgot her log book and meter today and reports highest fasting 120 (most ranging from 90-102) and highest pp 110 (most ranging from 95-110). Patient scheduled for ultrasound on 4/27. Patient desires TOLAC. Risks and benefits reviewed and consent form signed Patient desires BTL. 30-day papers signed today as well

## 2014-09-04 NOTE — Progress Notes (Signed)
Elevated urobilinogen and small leuks noted in urine.

## 2014-09-06 ENCOUNTER — Ambulatory Visit (HOSPITAL_COMMUNITY)
Admission: RE | Admit: 2014-09-06 | Discharge: 2014-09-06 | Disposition: A | Discharge status: Home / Self Care | Payer: Medicaid Other | Source: Ambulatory Visit | Attending: Family Medicine | Admitting: Family Medicine

## 2014-09-06 DIAGNOSIS — O24113 Pre-existing diabetes mellitus, type 2, in pregnancy, third trimester: Secondary | ICD-10-CM | POA: Insufficient documentation

## 2014-09-06 DIAGNOSIS — O359XX0 Maternal care for (suspected) fetal abnormality and damage, unspecified, not applicable or unspecified: Secondary | ICD-10-CM

## 2014-09-06 DIAGNOSIS — E119 Type 2 diabetes mellitus without complications: Secondary | ICD-10-CM | POA: Insufficient documentation

## 2014-09-06 DIAGNOSIS — Z6791 Unspecified blood type, Rh negative: Secondary | ICD-10-CM | POA: Diagnosis not present

## 2014-09-06 DIAGNOSIS — Z3A30 30 weeks gestation of pregnancy: Secondary | ICD-10-CM | POA: Insufficient documentation

## 2014-09-06 DIAGNOSIS — O24912 Unspecified diabetes mellitus in pregnancy, second trimester: Secondary | ICD-10-CM

## 2014-09-06 DIAGNOSIS — O360190 Maternal care for anti-D [Rh] antibodies, unspecified trimester, not applicable or unspecified: Secondary | ICD-10-CM

## 2014-09-06 DIAGNOSIS — O09299 Supervision of pregnancy with other poor reproductive or obstetric history, unspecified trimester: Secondary | ICD-10-CM

## 2014-09-06 DIAGNOSIS — B009 Herpesviral infection, unspecified: Secondary | ICD-10-CM

## 2014-09-06 DIAGNOSIS — O09293 Supervision of pregnancy with other poor reproductive or obstetric history, third trimester: Secondary | ICD-10-CM | POA: Diagnosis not present

## 2014-09-06 DIAGNOSIS — Z98891 History of uterine scar from previous surgery: Secondary | ICD-10-CM

## 2014-09-06 DIAGNOSIS — O26893 Other specified pregnancy related conditions, third trimester: Secondary | ICD-10-CM | POA: Diagnosis not present

## 2014-09-06 DIAGNOSIS — O358XX0 Maternal care for other (suspected) fetal abnormality and damage, not applicable or unspecified: Secondary | ICD-10-CM | POA: Insufficient documentation

## 2014-09-06 DIAGNOSIS — O99212 Obesity complicating pregnancy, second trimester: Secondary | ICD-10-CM

## 2014-09-06 DIAGNOSIS — O24112 Pre-existing diabetes mellitus, type 2, in pregnancy, second trimester: Secondary | ICD-10-CM

## 2014-09-15 ENCOUNTER — Emergency Department (HOSPITAL_COMMUNITY)
Admission: EM | Admit: 2014-09-15 | Discharge: 2014-09-15 | Disposition: A | Discharge status: Home / Self Care | Payer: Medicaid Other | Attending: Emergency Medicine | Admitting: Emergency Medicine

## 2014-09-15 ENCOUNTER — Encounter (HOSPITAL_COMMUNITY): Payer: Self-pay | Admitting: Emergency Medicine

## 2014-09-15 DIAGNOSIS — Z3A32 32 weeks gestation of pregnancy: Secondary | ICD-10-CM | POA: Insufficient documentation

## 2014-09-15 DIAGNOSIS — R102 Pelvic and perineal pain: Secondary | ICD-10-CM | POA: Insufficient documentation

## 2014-09-15 DIAGNOSIS — Z794 Long term (current) use of insulin: Secondary | ICD-10-CM | POA: Diagnosis not present

## 2014-09-15 DIAGNOSIS — O2643 Herpes gestationis, third trimester: Secondary | ICD-10-CM | POA: Insufficient documentation

## 2014-09-15 DIAGNOSIS — O24913 Unspecified diabetes mellitus in pregnancy, third trimester: Secondary | ICD-10-CM | POA: Insufficient documentation

## 2014-09-15 DIAGNOSIS — H109 Unspecified conjunctivitis: Secondary | ICD-10-CM

## 2014-09-15 DIAGNOSIS — E669 Obesity, unspecified: Secondary | ICD-10-CM | POA: Diagnosis not present

## 2014-09-15 DIAGNOSIS — O99213 Obesity complicating pregnancy, third trimester: Secondary | ICD-10-CM | POA: Diagnosis not present

## 2014-09-15 DIAGNOSIS — O10013 Pre-existing essential hypertension complicating pregnancy, third trimester: Secondary | ICD-10-CM | POA: Diagnosis not present

## 2014-09-15 DIAGNOSIS — O99353 Diseases of the nervous system complicating pregnancy, third trimester: Secondary | ICD-10-CM | POA: Insufficient documentation

## 2014-09-15 DIAGNOSIS — Z79899 Other long term (current) drug therapy: Secondary | ICD-10-CM | POA: Insufficient documentation

## 2014-09-15 DIAGNOSIS — O9989 Other specified diseases and conditions complicating pregnancy, childbirth and the puerperium: Secondary | ICD-10-CM | POA: Insufficient documentation

## 2014-09-15 DIAGNOSIS — B009 Herpesviral infection, unspecified: Secondary | ICD-10-CM

## 2014-09-15 MED ORDER — ERYTHROMYCIN 5 MG/GM OP OINT: TOPICAL_OINTMENT | OPHTHALMIC | Status: DC

## 2014-09-15 MED ORDER — VALACYCLOVIR HCL 1 G PO TABS: 500.0000 mg | ORAL_TABLET | Freq: Two times a day (BID) | ORAL | Status: AC

## 2014-09-15 NOTE — ED Notes (Signed)
The patient said she had a hair in her eye and she took it out but now it is infected.  She is also [redacted] weeks pregnant and says she thinks she has a yeast infection.   She denies pain but is nauseated.  The patient says she does have white discharge coming out of her vagina.  Her eye does have exudate coming out of it.

## 2014-09-15 NOTE — Discharge Instructions (Signed)
Genital Herpes Genital herpes is a sexually transmitted disease. This means that it is a disease passed by having sex with an infected person. There is no cure for genital herpes. The time between attacks can be months to years. The virus may live in a person but produce no problems (symptoms). This infection can be passed to a baby as it travels down the birth canal (vagina). In a newborn, this can cause central nervous system damage, eye damage, or even death. The virus that causes genital herpes is usually HSV-2 virus. The virus that causes oral herpes is usually HSV-1. The diagnosis (learning what is wrong) is made through culture results. SYMPTOMS  Usually symptoms of pain and itching begin a few days to a week after contact. It first appears as small blisters that progress to small painful ulcers which then scab over and heal after several days. It affects the outer genitalia, birth canal, cervix, penis, anal area, buttocks, and thighs. HOME CARE INSTRUCTIONS   Keep ulcerated areas dry and clean.  Take medications as directed. Antiviral medications can speed up healing. They will not prevent recurrences or cure this infection. These medications can also be taken for suppression if there are frequent recurrences.  While the infection is active, it is contagious. Avoid all sexual contact during active infections.  Condoms may help prevent spread of the herpes virus.  Practice safe sex.  Wash your hands thoroughly after touching the genital area.  Avoid touching your eyes after touching your genital area.  Inform your caregiver if you have had genital herpes and become pregnant. It is your responsibility to insure a safe outcome for your baby in this pregnancy.  Only take over-the-counter or prescription medicines for pain, discomfort, or fever as directed by your caregiver. SEEK MEDICAL CARE IF:   You have a recurrence of this infection.  You do not respond to medications and are not  improving.  You have new sources of pain or discharge which have changed from the original infection.  You have an oral temperature above 102 F (38.9 C).  You develop abdominal pain.  You develop eye pain or signs of eye infection. Document Released: 04/25/2000 Document Revised: 07/21/2011 Document Reviewed: 05/16/2009 [facility]Exit[facility] Patient Information 2015 [place], [place]. This information is not intended to replace advice given to you by your health care provider. Make sure you discuss any questions you have with your health care provider. Conjunctivitis Conjunctivitis is commonly called "pink eye." Conjunctivitis can be caused by bacterial or viral infection, allergies, or injuries. There is usually redness of the lining of the eye, itching, discomfort, and sometimes discharge. There may be deposits of matter along the eyelids. A viral infection usually causes a watery discharge, while a bacterial infection causes a yellowish, thick discharge. Pink eye is very contagious and spreads by direct contact. You may be given antibiotic eyedrops as part of your treatment. Before using your eye medicine, remove all drainage from the eye by washing gently with warm water and cotton balls. Continue to use the medication until you have awakened 2 mornings in a row without discharge from the eye. Do not rub your eye. This increases the irritation and helps spread infection. Use separate towels from other household members. Wash your hands with soap and water before and after touching your eyes. Use cold compresses to reduce pain and sunglasses to relieve irritation from light. Do not wear contact lenses or wear eye makeup until the infection is gone. SEEK MEDICAL CARE IF:  Your symptoms are not better after 3 days of treatment.  You have increased pain or trouble seeing.  The outer eyelids become very red or swollen. Document Released: 06/05/2004 Document Revised: 07/21/2011 Document Reviewed:  04/28/2005 [facility]phExitCare Patient Information 2015 [place], [place]. This information is not intended to replace advice given to you by your health care provider. Make sure you discuss any questions you have with your health care provider.

## 2014-09-15 NOTE — ED Provider Notes (Signed)
CSN: [id number]     Arrival date & time 09/15/14  2012 History   First MD Initiated Contact with Patient 09/15/14 2210     Chief Complaint  Patient presents with  . Vaginitis    The patient said she had a hair in her eye and she took it out but now it is infected.  She is also [redacted] weeks pregnant and says she thinks she has a yeast infection.   . Conjunctivitis     (Consider location/radiation/quality/duration/timing/severity/associated sxs/prior Treatment) HPI Comments: Patient who is [redacted] weeks pregnant presents to the emergency department with chief complaint of vaginal pain and mild discharge. She states the symptoms started on Tuesday. She has a history of herpes. She states that she feels pain over her clitoris. She denies any pain with urination. Denies any abdominal pain today. Denies any cramping now. Additionally, she complains of right eye redness, and itching. She states that she had some discharge today. She states the eye does not hurt.  The history is provided by the patient. No language interpreter was used.    Past Medical History  Diagnosis Date  . Hypertension   . Herpes simplex without mention of complication   . Obesity   . Diabetes mellitus     type II  . Irregular menses   . Abnormal Pap smear   . GBS (group B streptococcus) UTI complicating pregnancy    Past Surgical History  Procedure Laterality Date  . Cesarean section  01/30/2011    Procedure: CESAREAN SECTION;  Surgeon: [name], MD;  Location: [facility];  Service: Gynecology;  Laterality: N/A;   Family History  Problem Relation Age of Onset  . Diabetes Paternal Grandmother   . Hypertension Paternal Grandmother   . Diabetes Maternal Grandmother   . Diabetes Father   . Cancer Paternal Uncle    History  Substance Use Topics  . Smoking status: Never Smoker   . Smokeless tobacco: Never Used  . Alcohol Use: No   OB History    Gravida Para Term Preterm AB TAB SAB Ectopic Multiple Living   4 1 1  2   2   1      Review of Systems  Constitutional: Negative for fever and chills.  Eyes: Positive for discharge and itching.  Respiratory: Negative for shortness of breath.   Cardiovascular: Negative for chest pain.  Gastrointestinal: Negative for nausea, vomiting, diarrhea and constipation.  Genitourinary: Positive for vaginal pain. Negative for dysuria.  All other systems reviewed and are negative.     Allergies  Review of patient's allergies indicates no known allergies.  Home Medications   Prior to Admission medications   Medication Sig Start Date End Date Taking? Authorizing Provider  acetaminophen (TYLENOL) 325 MG tablet Take 325 mg by mouth every 6 (six) hours as needed for mild pain or headache.   Yes Historical Provider, MD  butalbital-acetaminophen-caffeine (FIORICET) 50-325-40 MG per tablet Take 1-2 tablets by mouth every 6 (six) hours as needed for headache. 08/09/14 08/09/15 Yes Lori A Clemmons, CNM  cyclobenzaprine (FLEXERIL) 10 MG tablet Take 1 tablet (10 mg total) by mouth once. Patient taking differently: Take 10 mg by mouth daily as needed.  08/17/14  Yes Christin Fudge, CNM  insulin aspart (NOVOLOG) 100 UNIT/ML injection 25 units with breakfast and 20 units with dinner 08/21/14  Yes Ugonna A Anyanwu, MD  insulin NPH Human (HUMULIN N,NOVOLIN N) 100 UNIT/ML injection 45 units at Breakfast , 20 units at HS Provide pen if  available 08/21/14  Yes Osborne Oman, MD  ondansetron (ZOFRAN) 4 MG tablet Take 1 tablet (4 mg total) by mouth daily as needed for nausea or vomiting. 08/09/14  Yes Larey Days, CNM  Prenatal Vit-Fe Fumarate-FA (PRENATAL MULTIVITAMIN) TABS tablet Take 1 tablet by mouth daily at 12 noon.   Yes Historical Provider, MD  ACCU-CHEK FASTCLIX LANCETS MISC Check sugar up to 6 x daily 08/02/13   Coral Spikes, DO  Blood Glucose Monitoring Suppl (ACCU-CHEK NANO SMARTVIEW) W/DEVICE KIT Use up to 6 times daily to test blood sugars 08/02/13   Coral Spikes, DO   glucose blood (ACCU-CHEK SMARTVIEW) test strip Check sugar 6 x daily 08/02/13   Coral Spikes, DO  Insulin Syringe-Needle U-100 (INSULIN SYRINGE 1CC/31GX5/16") 31G X 5/16" 1 ML MISC 100 each by Does not apply route 4 (four) times daily. 08/14/14   Osborne Oman, MD   BP 111/61 mmHg  Pulse 100  Temp(Src) 97.8 F (36.6 C) (Oral)  Resp 16  SpO2 100%  LMP 02/23/2014 Physical Exam  Constitutional: She is oriented to person, place, and time. She appears well-developed and well-nourished.  HENT:  Head: Normocephalic and atraumatic.  Eyes: EOM are normal. Pupils are equal, round, and reactive to light.  Right eye conjunctiva is mildly erythematous, with mild discharge, no foreign body  Neck: Normal range of motion. Neck supple.  Cardiovascular: Normal rate and regular rhythm.  Exam reveals no gallop and no friction rub.   No murmur heard. Pulmonary/Chest: Effort normal and breath sounds normal. No respiratory distress. She has no wheezes. She has no rales. She exhibits no tenderness.  Abdominal: Soft. Bowel sounds are normal. She exhibits no distension and no mass. There is no tenderness. There is no rebound and no guarding.  Genitourinary:  Chaperone present for exam, there is vesicular lesions on erythematous base consistent with herpes on the external genitalia  Musculoskeletal: Normal range of motion. She exhibits no edema or tenderness.  Neurological: She is alert and oriented to person, place, and time.  Skin: Skin is warm and dry.  Psychiatric: She has a normal mood and affect. Her behavior is normal. Judgment and thought content normal.  Nursing note and vitals reviewed.   ED Course  Procedures (including critical care time) Labs Review Labs Reviewed - No data to display  Imaging Review No results found.   EKG Interpretation None      MDM   Final diagnoses:  Herpes  Conjunctivitis of right eye    patient is [redacted] weeks pregnant. No cramping or abdominal pain. Patient  has what appears to be the start of a herpes outbreak on her external genitalia. Discussed this with Dr. Thurnell Garbe, who recommends consulting OB/GYN. Discussed with Dr. Nehemiah Settle from OB/GYN, who recommends treating with Valtrex. Will also give erythromycin gel for conjunctivitis. Patient has follow-up on Monday with OB/GYN. Patient understands agrees the plan. She is stable and ready for discharge.  Montine Circle, PA-C 09/15/14 [place], DO 09/17/14 [id number]

## 2014-09-15 NOTE — ED Notes (Signed)
This RN spoke with OB rapid response, who states that she does not need to come and see the pt, based on the pt's symptoms, unless the MD states that it is necessary.

## 2014-09-18 ENCOUNTER — Encounter: Payer: Self-pay | Admitting: Family Medicine

## 2014-09-18 ENCOUNTER — Ambulatory Visit (INDEPENDENT_AMBULATORY_CARE_PROVIDER_SITE_OTHER): Payer: Medicaid Other | Admitting: Family Medicine

## 2014-09-18 VITALS — BP 103/64 | HR 85 | Wt 211.1 lb

## 2014-09-18 DIAGNOSIS — IMO0002 Reserved for concepts with insufficient information to code with codable children: Secondary | ICD-10-CM

## 2014-09-18 DIAGNOSIS — O24913 Unspecified diabetes mellitus in pregnancy, third trimester: Secondary | ICD-10-CM

## 2014-09-18 DIAGNOSIS — E1165 Type 2 diabetes mellitus with hyperglycemia: Secondary | ICD-10-CM

## 2014-09-18 DIAGNOSIS — O0991 Supervision of high risk pregnancy, unspecified, first trimester: Secondary | ICD-10-CM

## 2014-09-18 DIAGNOSIS — O0993 Supervision of high risk pregnancy, unspecified, third trimester: Secondary | ICD-10-CM

## 2014-09-18 LAB — POCT URINALYSIS DIP (DEVICE)
Bilirubin Urine: NEGATIVE
Glucose, UA: NEGATIVE mg/dL
Hgb urine dipstick: NEGATIVE
NITRITE: NEGATIVE
PROTEIN: NEGATIVE mg/dL
Specific Gravity, Urine: 1.02 (ref 1.005–1.030)
UROBILINOGEN UA: 0.2 mg/dL (ref 0.0–1.0)
pH: 6.5 (ref 5.0–8.0)

## 2014-09-18 NOTE — Progress Notes (Signed)
Forgot book today but reports blood sugars as controlled. Start twice weekly testing. Had HSV outbreak - started on valtrex by ED

## 2014-09-18 NOTE — Patient Instructions (Signed)
Nonstress Test °The nonstress test is a procedure that monitors the fetus's heartbeat. The test will monitor the heartbeat when the fetus is at rest and while the fetus is moving. In a healthy fetus, there will be an increase in fetal heart rate when the fetus moves or kicks. The heart rate will decrease at rest. This test helps determine if the fetus is healthy. Your health care provider will look at a number of patterns in the heart rate tracing to make sure your baby is thriving. If there is concern, your health care provider may order additional tests or may suggest another course of action. This test is often done in the third trimester and can help determine if an early delivery is needed and safe. Common reasons to have this test are: °· You are past your due date. °· You have a high-risk pregnancy. °· You are feeling less movement than normal. °· You have lost a pregnancy in the past. °· Your health care provider suspects fetal growth problems. °· You have too much or too little amniotic fluid. °BEFORE THE PROCEDURE °· Eat a meal right before the test or as directed by your health care provider. Food may help stimulate fetal movements. °· Use the restroom right before the test. °PROCEDURE °· Two belts will be placed around your abdomen. These belts have monitors attached to them. One records the fetal heart rate and the other records uterine contractions. °· You may be asked to lie down on your side or to stay sitting upright. °· You may be given a button to press when you feel movement. °· The fetal heartbeat is listened to and watched on a screen. The heartbeat is recorded on a sheet of paper. °· If the fetus seems to be sleeping, you may be asked to drink some juice or soda, gently press your abdomen, or make some noise to wake the fetus. °AFTER THE PROCEDURE  °Your health care provider will discuss the test results with you and make recommendations for the near future. °Document Released: 04/18/2002  Document Revised: 09/12/2013 Document Reviewed: 06/01/2012 °ExitCare® Patient Information ©2015 Exit[employer], LLC. This information is not intended to replace advice given to you by your health care provider. Make sure you discuss any questions you have with your health care provider. ° °

## 2014-09-21 ENCOUNTER — Ambulatory Visit (INDEPENDENT_AMBULATORY_CARE_PROVIDER_SITE_OTHER): Payer: Medicaid Other | Admitting: *Deleted

## 2014-09-21 VITALS — BP 128/59 | HR 98

## 2014-09-21 DIAGNOSIS — O10913 Unspecified pre-existing hypertension complicating pregnancy, third trimester: Secondary | ICD-10-CM

## 2014-09-21 DIAGNOSIS — O24913 Unspecified diabetes mellitus in pregnancy, third trimester: Secondary | ICD-10-CM | POA: Diagnosis not present

## 2014-09-21 DIAGNOSIS — E119 Type 2 diabetes mellitus without complications: Secondary | ICD-10-CM

## 2014-09-21 DIAGNOSIS — I10 Essential (primary) hypertension: Secondary | ICD-10-CM | POA: Diagnosis not present

## 2014-09-21 NOTE — Progress Notes (Signed)
Pt was reminded to bring BS log book to appt on 5/16.  US for growth/BPP on 5/26

## 2014-09-21 NOTE — Progress Notes (Signed)
NST performed today was reviewed and was found to be reactive.  Continue recommended antenatal testing and prenatal care.  

## 2014-09-25 ENCOUNTER — Other Ambulatory Visit: Payer: Medicaid Other

## 2014-09-25 ENCOUNTER — Telehealth: Payer: Self-pay | Admitting: Obstetrics & Gynecology

## 2014-09-25 NOTE — Telephone Encounter (Signed)
Called patient to inform her of missed appointment on today, but to make sure she tries to keep her appointment on Thursday 05/19.

## 2014-09-26 ENCOUNTER — Encounter: Payer: Self-pay | Admitting: *Deleted

## 2014-09-28 ENCOUNTER — Ambulatory Visit (INDEPENDENT_AMBULATORY_CARE_PROVIDER_SITE_OTHER): Payer: Medicaid Other | Admitting: *Deleted

## 2014-09-28 VITALS — BP 128/68 | HR 90

## 2014-09-28 DIAGNOSIS — I10 Essential (primary) hypertension: Secondary | ICD-10-CM | POA: Diagnosis not present

## 2014-09-28 DIAGNOSIS — O10913 Unspecified pre-existing hypertension complicating pregnancy, third trimester: Secondary | ICD-10-CM | POA: Diagnosis present

## 2014-09-28 DIAGNOSIS — O24913 Unspecified diabetes mellitus in pregnancy, third trimester: Secondary | ICD-10-CM

## 2014-09-28 DIAGNOSIS — E119 Type 2 diabetes mellitus without complications: Secondary | ICD-10-CM | POA: Diagnosis not present

## 2014-09-28 NOTE — Progress Notes (Signed)
NST performed today was reviewed and was found to be reactive.  AFI normal at 12.7 cm.  Continue recommended antenatal testing and prenatal care.

## 2014-10-02 ENCOUNTER — Inpatient Hospital Stay (HOSPITAL_COMMUNITY)
Admission: AD | Admit: 2014-10-02 | Discharge: 2014-10-02 | Disposition: A | Discharge status: Home / Self Care | Payer: Medicaid Other | Source: Ambulatory Visit | Attending: Obstetrics & Gynecology | Admitting: Obstetrics & Gynecology

## 2014-10-02 ENCOUNTER — Encounter: Payer: Self-pay | Admitting: Obstetrics and Gynecology

## 2014-10-02 ENCOUNTER — Ambulatory Visit (INDEPENDENT_AMBULATORY_CARE_PROVIDER_SITE_OTHER): Payer: Medicaid Other | Admitting: Obstetrics and Gynecology

## 2014-10-02 VITALS — BP 141/77 | HR 73 | Wt 223.1 lb

## 2014-10-02 DIAGNOSIS — O133 Gestational [pregnancy-induced] hypertension without significant proteinuria, third trimester: Secondary | ICD-10-CM

## 2014-10-02 DIAGNOSIS — R030 Elevated blood-pressure reading, without diagnosis of hypertension: Secondary | ICD-10-CM | POA: Insufficient documentation

## 2014-10-02 DIAGNOSIS — O24913 Unspecified diabetes mellitus in pregnancy, third trimester: Secondary | ICD-10-CM | POA: Diagnosis not present

## 2014-10-02 DIAGNOSIS — O34219 Maternal care for unspecified type scar from previous cesarean delivery: Secondary | ICD-10-CM

## 2014-10-02 DIAGNOSIS — O219 Vomiting of pregnancy, unspecified: Secondary | ICD-10-CM | POA: Diagnosis not present

## 2014-10-02 DIAGNOSIS — E1165 Type 2 diabetes mellitus with hyperglycemia: Secondary | ICD-10-CM

## 2014-10-02 DIAGNOSIS — I10 Essential (primary) hypertension: Secondary | ICD-10-CM | POA: Diagnosis not present

## 2014-10-02 DIAGNOSIS — O3421 Maternal care for scar from previous cesarean delivery: Secondary | ICD-10-CM

## 2014-10-02 DIAGNOSIS — O99213 Obesity complicating pregnancy, third trimester: Secondary | ICD-10-CM

## 2014-10-02 DIAGNOSIS — O9989 Other specified diseases and conditions complicating pregnancy, childbirth and the puerperium: Secondary | ICD-10-CM | POA: Insufficient documentation

## 2014-10-02 DIAGNOSIS — IMO0001 Reserved for inherently not codable concepts without codable children: Secondary | ICD-10-CM

## 2014-10-02 DIAGNOSIS — Z3A35 35 weeks gestation of pregnancy: Secondary | ICD-10-CM | POA: Diagnosis not present

## 2014-10-02 DIAGNOSIS — E119 Type 2 diabetes mellitus without complications: Secondary | ICD-10-CM

## 2014-10-02 DIAGNOSIS — Z3A33 33 weeks gestation of pregnancy: Secondary | ICD-10-CM | POA: Diagnosis not present

## 2014-10-02 DIAGNOSIS — O10913 Unspecified pre-existing hypertension complicating pregnancy, third trimester: Secondary | ICD-10-CM

## 2014-10-02 DIAGNOSIS — E669 Obesity, unspecified: Secondary | ICD-10-CM

## 2014-10-02 DIAGNOSIS — O0991 Supervision of high risk pregnancy, unspecified, first trimester: Secondary | ICD-10-CM

## 2014-10-02 DIAGNOSIS — O360131 Maternal care for anti-D [Rh] antibodies, third trimester, fetus 1: Secondary | ICD-10-CM

## 2014-10-02 DIAGNOSIS — IMO0002 Reserved for concepts with insufficient information to code with codable children: Secondary | ICD-10-CM

## 2014-10-02 LAB — PROTEIN / CREATININE RATIO, URINE
CREATININE, URINE: 78 mg/dL
PROTEIN CREATININE RATIO: 0.22 mg/mg{creat} — AB (ref 0.00–0.15)
Total Protein, Urine: 17 mg/dL

## 2014-10-02 LAB — CBC
HEMATOCRIT: 34.4 % — AB (ref 36.0–46.0)
Hemoglobin: 11.3 g/dL — ABNORMAL LOW (ref 12.0–15.0)
MCH: 26.3 pg (ref 26.0–34.0)
MCHC: 32.8 g/dL (ref 30.0–36.0)
MCV: 80 fL (ref 78.0–100.0)
Platelets: 245 10*3/uL (ref 150–400)
RBC: 4.3 MIL/uL (ref 3.87–5.11)
RDW: 15.2 % (ref 11.5–15.5)
WBC: 6.7 10*3/uL (ref 4.0–10.5)

## 2014-10-02 LAB — POCT URINALYSIS DIP (DEVICE)
Bilirubin Urine: NEGATIVE
GLUCOSE, UA: 500 mg/dL — AB
Hgb urine dipstick: NEGATIVE
Ketones, ur: NEGATIVE mg/dL
Nitrite: NEGATIVE
PH: 7 (ref 5.0–8.0)
PROTEIN: NEGATIVE mg/dL
Specific Gravity, Urine: 1.015 (ref 1.005–1.030)
UROBILINOGEN UA: 0.2 mg/dL (ref 0.0–1.0)

## 2014-10-02 LAB — COMPREHENSIVE METABOLIC PANEL
ALBUMIN: 2.6 g/dL — AB (ref 3.5–5.0)
ALT: 10 U/L — ABNORMAL LOW (ref 14–54)
AST: 12 U/L — ABNORMAL LOW (ref 15–41)
Alkaline Phosphatase: 135 U/L — ABNORMAL HIGH (ref 38–126)
Anion gap: 5 (ref 5–15)
BUN: 8 mg/dL (ref 6–20)
CALCIUM: 8.9 mg/dL (ref 8.9–10.3)
CO2: 23 mmol/L (ref 22–32)
Chloride: 107 mmol/L (ref 101–111)
Creatinine, Ser: 0.55 mg/dL (ref 0.44–1.00)
GFR calc Af Amer: >60 mL/min (ref >60)
GFR calc non Af Amer: >60 mL/min (ref >60)
GLUCOSE: 132 mg/dL — AB (ref 65–99)
Potassium: 4.1 mmol/L (ref 3.5–5.1)
Sodium: 135 mmol/L (ref 135–145)
TOTAL PROTEIN: 6.9 g/dL (ref 6.5–8.1)
Total Bilirubin: 0.3 mg/dL (ref 0.3–1.2)

## 2014-10-02 MED ORDER — SODIUM CHLORIDE 0.9 % IV SOLN
25.0000 mg | Freq: Once | INTRAVENOUS | Status: AC
  Administered 2014-10-02: 25 mg via INTRAVENOUS
  Filled 2014-10-02: qty 1

## 2014-10-02 MED ORDER — DIPHENHYDRAMINE HCL 50 MG/ML IJ SOLN
25.0000 mg | Freq: Once | INTRAMUSCULAR | Status: AC
  Administered 2014-10-02: 25 mg via INTRAVENOUS
  Filled 2014-10-02: qty 1

## 2014-10-02 NOTE — Progress Notes (Signed)
  Subjective:    [name] [name] is a 29 y.o. female being seen today for her obstetrical visit. She is at 2637w6d gestation. Patient reports feeling nauseated and some episodes of emesis. her nausea is not well controlled by zofran. Fetal movement: normal. She denies HA, visual disturbances, RUQ/epigastric pain  Menstrual History: OB History    Gravida Para Term Preterm AB TAB SAB Ectopic Multiple Living   4 1 1  2  2   1        Patient's last menstrual period was 02/23/2014.       Review of Systems Pertinent items are noted in HPI.   Objective:    BP 141/77 mmHg  Pulse 73  Wt 223 lb 1.6 oz (101.197 kg)  LMP 02/23/2014 FHT:  130 BPM  Uterine Size: 35 cm  Presentation: unsure     Assessment:     29 yo G4P1021 at 7137w6d with type 2 DM     Plan:     FM/PTL/preeclampsia precautions reviewed  Patient did not bring CBG log for review today but reports highest fasting 127 and highest pp 112 Patient with elevated BP today, nausea and 12lb weight gain. Will send to MAU to rule out the development of preeclampsia NST reviewed and reactive Follow up ultrasound 5/26 Follow up in 1 Week.

## 2014-10-02 NOTE — MAU Provider Note (Signed)
History  CSN: [id number]  Arrival date and time: 10/02/14 1829  Chief Complaint  Patient presents with  . Hypertension   HPI  Patient is 29 y.o. [id number] 59w6dhere with complaints of pressure in lower pelvis. Sent from clinic for elevated pressures. Diagnosed with pre-E in first pregnancy. [place] [facility] [facility] [place] Took blood pressure medication then. Not currently on medications. PIH symptoms no headaches today, vision changes, no RUQ abdominal pain. Endorses some lower leg swelling.  +FM, +contractions, denies LOF, VB, vaginal discharge.  Past Medical History  Diagnosis Date  . Hypertension   . Herpes simplex without mention of complication   . Obesity   . Diabetes mellitus     type II  . Irregular menses   . Abnormal Pap smear   . GBS (group B streptococcus) UTI complicating pregnancy     Past Surgical History  Procedure Laterality Date  . Cesarean section  01/30/2011    Procedure: CESAREAN SECTION;  Surgeon: JJonnie Kind MD;  [facility];  Service: Gynecology;  Laterality: N/A;    Family History  Problem Relation Age of Onset  . Diabetes Paternal Grandmother   . Hypertension Paternal Grandmother   . Diabetes Maternal Grandmother   . Diabetes Father   . Cancer Paternal Uncle     History  Substance Use Topics  . Smoking status: Never Smoker   . Smokeless tobacco: Never Used  . Alcohol Use: No    Allergies: No Known Allergies  Prescriptions prior to admission  Medication Sig Dispense Refill Last Dose  . acetaminophen (TYLENOL) 325 MG tablet Take 325 mg by mouth every 6 (six) hours as needed for mild pain or headache.   Past Week at Unknown time  . butalbital-acetaminophen-caffeine (FIORICET) 50-325-40 MG per tablet Take 1-2 tablets by mouth every 6 (six) hours as needed for headache. (Patient taking differently: Take 1-2 tablets by mouth every 6 (six) hours as needed for headache. ) 20 tablet 0 Past Month at Unknown time  .  cyclobenzaprine (FLEXERIL) 10 MG tablet Take 1 tablet (10 mg total) by mouth once. (Patient taking differently: Take 10 mg by mouth once. ) 30 tablet 0 Past Month at Unknown time  . insulin aspart (NOVOLOG) 100 UNIT/ML injection 25 units with breakfast and 20 units with dinner (Patient taking differently: Inject 20-25 Units into the skin 2 (two) times daily. 25 units with breakfast and 20 units with dinner) 10 mL 12 10/02/2014 at Unknown time  . insulin NPH Human (HUMULIN N,NOVOLIN N) 100 UNIT/ML injection 45 units at Breakfast , 20 units at HS Provide pen if available (Patient taking differently: 20-45 Units 2 (two) times daily before a meal. 45 units at Breakfast , 20 units at dinner Provide pen if available) 10 mL 3 10/02/2014 at Unknown time  . ondansetron (ZOFRAN) 4 MG tablet Take 1 tablet (4 mg total) by mouth daily as needed for nausea or vomiting. 30 tablet 1 10/02/2014 at Unknown time  . Prenatal Vit-Fe Fumarate-FA (PRENATAL MULTIVITAMIN) TABS tablet Take 1 tablet by mouth daily at 12 noon.   10/02/2014 at Unknown time  . ACCU-CHEK FASTCLIX LANCETS MISC Check sugar up to 6 x daily 204 each 3 Taking  . Blood Glucose Monitoring Suppl (ACCU-CHEK NANO SMARTVIEW) W/DEVICE KIT Use up to 6 times daily to test blood sugars 1 kit 0 Taking  . erythromycin ophthalmic ointment Place a 1/2 inch ribbon of ointment into the lower eyelid. (Patient not taking: Reported on 10/02/2014)  1 g 0 Not Taking  . glucose blood (ACCU-CHEK SMARTVIEW) test strip Check sugar 6 x daily 200 each 3 Taking  . Insulin Syringe-Needle U-100 (INSULIN SYRINGE 1CC/31GX5/16") 31G X 5/16" 1 ML MISC 100 each by Does not apply route 4 (four) times daily. 100 each 0 Taking    ROS per HPI.  Physical Exam   Last menstrual period 02/23/2014.  Physical Exam  Constitutional: She is oriented to person, place, and time. She appears well-developed and well-nourished. No distress.  HENT:  Head: Normocephalic and atraumatic.  Cardiovascular:  Normal rate, regular rhythm and normal heart sounds.   Respiratory: Effort normal and breath sounds normal.  GI: There is no tenderness.  Musculoskeletal: She exhibits edema.  Neurological: She is alert and oriented to person, place, and time. No cranial nerve deficit.  Skin: Skin is warm and dry.  SVE: closed/thick/high  MAU Course  Procedures - None  MDM: -Labs collected: only remarkable for UA with glucose of 500 -patient given phenergan bolus for N/V -FHT reviewed and reassuring -BPs rechecked and wnl - last two 127/71 and 128/72  Assessment and Plan  Patient is 29 y.o. [id number] 73w6dsent from clinic for elevated pressures.   Elevated Pressures: H/o chronic HTN, no signs of preE currently. Labs reassuring with no concerns for end organ damage.  No PIH signs. BPs stable.   - fetal kick counts reinforced - preterm labor precautions  JLuiz Blare DO 10/02/2014, 4:52 PM PGY-1, [facility]  OB fellow attestation:  I have seen and examined this patient; I agree with above documentation in the resident's note.   Hasna D CPeakeis a 29y.o. [phone] pelvic pressure, nausea and vomiting, however was sent from clinic for elevated blood pressures.  +FM, denies LOF, VB, contractions, vaginal discharge.  PE: BP 127/71 mmHg  Pulse 87  Resp 16  SpO2 100%  LMP 02/23/2014 Gen: calm comfortable, NAD Resp: normal effort, no distress Abd: gravid SVE: closed/thick (my exam)  ROS, labs, PMH reviewed NST reactive, uterine irritability and rare contractions noted   Plan: - fetal kick counts reinforced, preterm labor precautions - continue routine follow up in OB clinic - elevated BP: no severe range while in MAU, labs normal - nausea: improved with LR-phenergan infusion - glucosuria: normal CMP, no signs of DKA at this time  AMerla Riches MD 6:51 AM

## 2014-10-02 NOTE — Progress Notes (Signed)
US for growth/BPP on 5/26 @ [facility].  Wt gain of 12 lb in 2 weeks.  Pt reports nausea which is not relieved with Zofran.  Pt taken to MAU for further evaluation.

## 2014-10-02 NOTE — MAU Note (Addendum)
Sent from clinic BP elevation.  No prior hx.  Denies HA, visual changes or epigastric pain. Reports swelling in lower ex.  Pressure when up walking

## 2014-10-02 NOTE — MAU Note (Signed)
Pt here from clinic for HTN eval. Denies h/a dizziness blurred vision, however has had 12 pound weight gain.

## 2014-10-04 ENCOUNTER — Ambulatory Visit (HOSPITAL_COMMUNITY): Payer: Medicaid Other

## 2014-10-05 ENCOUNTER — Ambulatory Visit (HOSPITAL_COMMUNITY)
Admission: RE | Admit: 2014-10-05 | Discharge: 2014-10-05 | Disposition: A | Discharge status: Home / Self Care | Payer: Medicaid Other | Source: Ambulatory Visit | Attending: Obstetrics & Gynecology | Admitting: Obstetrics & Gynecology

## 2014-10-05 ENCOUNTER — Other Ambulatory Visit (HOSPITAL_COMMUNITY): Payer: Self-pay | Admitting: Obstetrics and Gynecology

## 2014-10-05 DIAGNOSIS — O10913 Unspecified pre-existing hypertension complicating pregnancy, third trimester: Secondary | ICD-10-CM

## 2014-10-05 DIAGNOSIS — O24913 Unspecified diabetes mellitus in pregnancy, third trimester: Secondary | ICD-10-CM | POA: Diagnosis not present

## 2014-10-06 ENCOUNTER — Encounter: Payer: Self-pay | Admitting: Obstetrics & Gynecology

## 2014-10-06 DIAGNOSIS — O35BXX0 Maternal care for other (suspected) fetal abnormality and damage, fetal cardiac anomalies, not applicable or unspecified: Secondary | ICD-10-CM | POA: Insufficient documentation

## 2014-10-06 DIAGNOSIS — O358XX0 Maternal care for other (suspected) fetal abnormality and damage, not applicable or unspecified: Secondary | ICD-10-CM | POA: Insufficient documentation

## 2014-10-08 ENCOUNTER — Inpatient Hospital Stay (HOSPITAL_COMMUNITY)
Admission: AD | Admit: 2014-10-08 | Discharge: 2014-10-08 | Disposition: A | Discharge status: Home / Self Care | Payer: Medicaid Other | Source: Ambulatory Visit | Attending: Family Medicine | Admitting: Family Medicine

## 2014-10-08 ENCOUNTER — Encounter (HOSPITAL_COMMUNITY): Payer: Self-pay | Admitting: *Deleted

## 2014-10-08 DIAGNOSIS — O133 Gestational [pregnancy-induced] hypertension without significant proteinuria, third trimester: Secondary | ICD-10-CM | POA: Diagnosis not present

## 2014-10-08 DIAGNOSIS — R109 Unspecified abdominal pain: Secondary | ICD-10-CM | POA: Diagnosis not present

## 2014-10-08 DIAGNOSIS — O9989 Other specified diseases and conditions complicating pregnancy, childbirth and the puerperium: Secondary | ICD-10-CM | POA: Insufficient documentation

## 2014-10-08 DIAGNOSIS — M545 Low back pain: Secondary | ICD-10-CM | POA: Insufficient documentation

## 2014-10-08 DIAGNOSIS — Z794 Long term (current) use of insulin: Secondary | ICD-10-CM | POA: Insufficient documentation

## 2014-10-08 DIAGNOSIS — Z8249 Family history of ischemic heart disease and other diseases of the circulatory system: Secondary | ICD-10-CM | POA: Insufficient documentation

## 2014-10-08 DIAGNOSIS — O24919 Unspecified diabetes mellitus in pregnancy, unspecified trimester: Secondary | ICD-10-CM | POA: Diagnosis not present

## 2014-10-08 DIAGNOSIS — Z833 Family history of diabetes mellitus: Secondary | ICD-10-CM | POA: Diagnosis not present

## 2014-10-08 DIAGNOSIS — O139 Gestational [pregnancy-induced] hypertension without significant proteinuria, unspecified trimester: Secondary | ICD-10-CM | POA: Diagnosis not present

## 2014-10-08 LAB — URINALYSIS, ROUTINE W REFLEX MICROSCOPIC
Bilirubin Urine: NEGATIVE
Glucose, UA: NEGATIVE mg/dL
Hgb urine dipstick: NEGATIVE
Ketones, ur: NEGATIVE mg/dL
Nitrite: NEGATIVE
Protein, ur: NEGATIVE mg/dL
Specific Gravity, Urine: 1.01 (ref 1.005–1.030)
Urobilinogen, UA: 0.2 mg/dL (ref 0.0–1.0)
pH: 5.5 (ref 5.0–8.0)

## 2014-10-08 LAB — URINE MICROSCOPIC-ADD ON

## 2014-10-08 LAB — CBC
HCT: 34 % — ABNORMAL LOW (ref 36.0–46.0)
Hemoglobin: 11.2 g/dL — ABNORMAL LOW (ref 12.0–15.0)
MCH: 26.2 pg (ref 26.0–34.0)
MCHC: 32.9 g/dL (ref 30.0–36.0)
MCV: 79.6 fL (ref 78.0–100.0)
Platelets: 231 10*3/uL (ref 150–400)
RBC: 4.27 MIL/uL (ref 3.87–5.11)
RDW: 15.5 % (ref 11.5–15.5)
WBC: 6.9 10*3/uL (ref 4.0–10.5)

## 2014-10-08 LAB — COMPREHENSIVE METABOLIC PANEL
ALT: 11 U/L — ABNORMAL LOW (ref 14–54)
AST: 15 U/L (ref 15–41)
Albumin: 2.6 g/dL — ABNORMAL LOW (ref 3.5–5.0)
Alkaline Phosphatase: 132 U/L — ABNORMAL HIGH (ref 38–126)
Anion gap: 3 — ABNORMAL LOW (ref 5–15)
BUN: 8 mg/dL (ref 6–20)
CO2: 21 mmol/L — ABNORMAL LOW (ref 22–32)
Calcium: 8.7 mg/dL — ABNORMAL LOW (ref 8.9–10.3)
Chloride: 111 mmol/L (ref 101–111)
Creatinine, Ser: 0.67 mg/dL (ref 0.44–1.00)
GFR calc Af Amer: >60 mL/min (ref >60)
GFR calc non Af Amer: >60 mL/min (ref >60)
Glucose, Bld: 70 mg/dL (ref 65–99)
Potassium: 3.5 mmol/L (ref 3.5–5.1)
Sodium: 135 mmol/L (ref 135–145)
Total Bilirubin: 0.4 mg/dL (ref 0.3–1.2)
Total Protein: 6.7 g/dL (ref 6.5–8.1)

## 2014-10-08 LAB — PROTEIN / CREATININE RATIO, URINE
Creatinine, Urine: 131 mg/dL
Protein Creatinine Ratio: 0.19 mg/mg{Cre} — ABNORMAL HIGH (ref 0.00–0.15)
Total Protein, Urine: 25 mg/dL

## 2014-10-08 LAB — GLUCOSE, CAPILLARY
Glucose-Capillary: 64 mg/dL — ABNORMAL LOW (ref 65–99)
Glucose-Capillary: 90 mg/dL (ref 65–99)

## 2014-10-08 LAB — WET PREP, GENITAL
Trich, Wet Prep: NONE SEEN
Yeast Wet Prep HPF POC: NONE SEEN

## 2014-10-08 NOTE — MAU Note (Signed)
Urine in lab 

## 2014-10-08 NOTE — MAU Note (Signed)
Pt states here for vaginal discharge and low back pain. Thinks she lost mucus plug. States discharge is thick and mucus like then was thinner and clear. Also having low back pain into vagina since last pm. Vomited last pm only. Denies uti s/s.

## 2014-10-08 NOTE — MAU Provider Note (Signed)
Subjective [name] is a 29 y.o. G30P1021 female with a PMHx significant for DM and preeclampsia who presents today with lower back pain and "stomach pressure". She was in her normal state of health until yesterday when her lower back began to hurt. She denies any precipitating event. She describes the pain as stabbing and a 7/10 that is constant without radiation. The pain does not worsen with movement and responds "somewhat" to tylenol. She reports feeling a similar pain before when she gave birth. She has not had any contractions and can feel the baby moving. She denies weakness, numbness, and tingling.  She also felt stomach pressure yesterday that turned into stomach pain this morning. She describes the pain as stabbing in quality. After a few hours the stomach pain resolved and returned to stomach pressure. She has vomited once at home and once in the MAU. She denies fever, headache, diarrhea, constipation, cough, dysuria, hematura, and seizures. The patient does endorse swelling (similar to last pregnancy) and blurry vision that occurs ~2x/week.  The patient is also concerned that her mucus plug has "moved." She reports noticing clumpy clear mucus this morning when wiping after urinating. This was small in volume and she has not had any other discharge or blood.  PMHx Preeclampsia with last pregnancy T2DM  FHx DM HTN  SHx Denies tobacco, alcohol, and illicit drugs  Meds Insulin Prenatal vitamin  Allergies NKDA  Objective Vitals T 98.3 HR 86-90 RR 20 BP 135-146/92-93  Lab Results Last 24 Hours    Results for orders placed or performed during the hospital encounter of 10/08/14 (from the past 24 hour(s))  Protein / creatinine ratio, urine Status: Abnormal   Collection Time: 10/08/14 3:02 PM  Result Value Ref Range   Creatinine, Urine 131.00 mg/dL   Total Protein, Urine 25 mg/dL   Protein Creatinine Ratio 0.19 (H) 0.00 - 0.15 mg/mg[Cre]  Urinalysis,  Routine w reflex microscopic (not at [facility]) Status: Abnormal   Collection Time: 10/08/14 3:02 PM  Result Value Ref Range   Color, Urine YELLOW YELLOW   APPearance CLEAR CLEAR   Specific Gravity, Urine 1.010 1.005 - 1.030   pH 5.5 5.0 - 8.0   Glucose, UA NEGATIVE NEGATIVE mg/dL   Hgb urine dipstick NEGATIVE NEGATIVE   Bilirubin Urine NEGATIVE NEGATIVE   Ketones, ur NEGATIVE NEGATIVE mg/dL   Protein, ur NEGATIVE NEGATIVE mg/dL   Urobilinogen, UA 0.2 0.0 - 1.0 mg/dL   Nitrite NEGATIVE NEGATIVE   Leukocytes, UA LARGE (A) NEGATIVE  Urine microscopic-add on Status: Abnormal   Collection Time: 10/08/14 3:02 PM  Result Value Ref Range   Squamous Epithelial / LPF MANY (A) RARE   WBC, UA 11-20 <3 WBC/hpf   Bacteria, UA FEW (A) RARE   Urine-Other MUCOUS PRESENT   Wet prep, genital Status: Abnormal   Collection Time: 10/08/14 4:50 PM  Result Value Ref Range   Yeast Wet Prep HPF POC NONE SEEN NONE SEEN   Trich, Wet Prep NONE SEEN NONE SEEN   Clue Cells Wet Prep HPF POC FEW (A) NONE SEEN   WBC, Wet Prep HPF POC FEW (A) NONE SEEN  CBC Status: Abnormal   Collection Time: 10/08/14 4:55 PM  Result Value Ref Range   WBC 6.9 4.0 - 10.5 K/uL   RBC 4.27 3.87 - 5.11 MIL/uL   Hemoglobin 11.2 (L) 12.0 - 15.0 g/dL   HCT 16.1 (L) 09.6 - 04.5 %   MCV 79.6 78.0 - 100.0 fL   MCH 26.2 26.0 -  34.0 pg   MCHC 32.9 30.0 - 36.0 g/dL   RDW 16.115.5 09.611.5 - 04.515.5 %   Platelets 231 150 - 400 K/uL  Comprehensive metabolic panel Status: Abnormal   Collection Time: 10/08/14 4:55 PM  Result Value Ref Range   Sodium 135 135 - 145 mmol/L   Potassium 3.5 3.5 - 5.1 mmol/L   Chloride 111 101 - 111 mmol/L   CO2 21 (L) 22 - 32 mmol/L   Glucose, Bld 70 65 - 99 mg/dL   BUN 8 6 - 20 mg/dL   Creatinine, Ser 4.090.67 0.44 - 1.00 mg/dL    Calcium 8.7 (L) 8.9 - 10.3 mg/dL   Total Protein 6.7 6.5 - 8.1 g/dL   Albumin 2.6 (L) 3.5 - 5.0 g/dL   AST 15 15 - 41 U/L   ALT 11 (L) 14 - 54 U/L   Alkaline Phosphatase 132 (H) 38 - 126 U/L   Total Bilirubin 0.4 0.3 - 1.2 mg/dL   GFR calc non Af Amer >60 >60 mL/min   GFR calc Af Amer >60 >60 mL/min   Anion gap 3 (L) 5 - 15  Glucose, capillary Status: Abnormal   Collection Time: 10/08/14 5:26 PM  Result Value Ref Range   Glucose-Capillary 64 (L) 65 - 99 mg/dL   Comment 1 Notify RN    Comment 2 Document in Chart   Glucose, capillary Status: None   Collection Time: 10/08/14 5:58 PM  Result Value Ref Range   Glucose-Capillary 90 65 - 99 mg/dL       PEx Gen: NAD, A/Ox3, interacts appropriately HEENT: NCAT, MMM, PERRL CV: RRR, LE edema noted Abd: gravid, tenderness noted in RLQ and LLQ DTR: 3+ patellar Skin: no rashes/lesions  Assessment/Plan Patient's mucus discharge is likely physiologic discharge with pregnancy given its small volume and lack of any other symptoms. Her back pain and abdominal pain are also likely from having a gravid uterus. Given her lack of neurologic findings, shooting pain, and impairment of function, patient does not require a work up at this time.  Her mildly elevated BPs, swelling in the legs, and visual disturbances are concerning for pre-eclampsia. Gestational Hypertension Deliver pt at 37 weeks Twice weekly testing Plan - CBC - CMP to check renal/liver function - U/A with protein:creatinine ratio  [facility] Discharge to home with preeclampsia precautions    Illene BolusLori Clemmons CNM 10/08/2014 612pm

## 2014-10-08 NOTE — Progress Notes (Signed)
Subjective [name] [name] is a 29 y.o. [id number] female with a PMHx significant for DM and preeclampsia who presents today with lower back pain and "stomach pressure". She was in her normal state of health until yesterday when her lower back began to hurt. She denies any precipitating event. She describes the pain as stabbing and a 7/10 that is constant without radiation. The pain does not worsen with movement and responds "somewhat" to tylenol. She reports feeling a similar pain before when she gave birth. She has not had any contractions and can feel the baby moving. She denies weakness, numbness, and tingling.  She also felt stomach pressure yesterday that turned into stomach pain this morning. She describes the pain as stabbing in quality. After a few hours the stomach pain resolved and returned to stomach pressure. She has vomited once at home and once in the MAU. She denies fever, headache, diarrhea, constipation, cough, dysuria, hematura, and seizures. The patient does endorse swelling (similar to last pregnancy) and blurry vision that occurs ~2x/week.  The patient is also concerned that her mucus plug has "moved." She reports noticing clumpy clear mucus this morning when wiping after urinating. This was small in volume and she has not had any other discharge or blood.  PMHx Preeclampsia with last pregnancy T2DM  FHx DM HTN  SHx Denies tobacco, alcohol, and illicit drugs  Meds Insulin Prenatal vitamin  Allergies NKDA  Objective Vitals T 98.3 HR 86-90 RR 20 BP 135-146/92-93 Results for orders placed or performed during the hospital encounter of 10/08/14 (from the past 24 hour(s))  Protein / creatinine ratio, urine     Status: Abnormal   Collection Time: 10/08/14  3:02 PM  Result Value Ref Range   Creatinine, Urine 131.00 mg/dL   Total Protein, Urine 25 mg/dL   Protein Creatinine Ratio 0.19 (H) 0.00 - 0.15 mg/mg[Cre]  Urinalysis, Routine w reflex microscopic ([facility])      Status: Abnormal   Collection Time: 10/08/14  3:02 PM  Result Value Ref Range   Color, Urine YELLOW YELLOW   APPearance CLEAR CLEAR   Specific Gravity, Urine 1.010 1.005 - 1.030   pH 5.5 5.0 - 8.0   Glucose, UA NEGATIVE NEGATIVE mg/dL   Hgb urine dipstick NEGATIVE NEGATIVE   Bilirubin Urine NEGATIVE NEGATIVE   Ketones, ur NEGATIVE NEGATIVE mg/dL   Protein, ur NEGATIVE NEGATIVE mg/dL   Urobilinogen, UA 0.2 0.0 - 1.0 mg/dL   Nitrite NEGATIVE NEGATIVE   Leukocytes, UA LARGE (A) NEGATIVE  Urine microscopic-add on     Status: Abnormal   Collection Time: 10/08/14  3:02 PM  Result Value Ref Range   Squamous Epithelial / LPF MANY (A) RARE   WBC, UA 11-20 <3 WBC/hpf   Bacteria, UA FEW (A) RARE   Urine-Other MUCOUS PRESENT   Wet prep, genital     Status: Abnormal   Collection Time: 10/08/14  4:50 PM  Result Value Ref Range   Yeast Wet Prep HPF POC NONE SEEN NONE SEEN   Trich, Wet Prep NONE SEEN NONE SEEN   Clue Cells Wet Prep HPF POC FEW (A) NONE SEEN   WBC, Wet Prep HPF POC FEW (A) NONE SEEN  CBC     Status: Abnormal   Collection Time: 10/08/14  4:55 PM  Result Value Ref Range   WBC 6.9 4.0 - 10.5 K/uL   RBC 4.27 3.87 - 5.11 MIL/uL   Hemoglobin 11.2 (L) 12.0 - 15.0 g/dL   HCT 78.234.0 (  L) 36.0 - 46.0 %   MCV 79.6 78.0 - 100.0 fL   MCH 26.2 26.0 - 34.0 pg   MCHC 32.9 30.0 - 36.0 g/dL   RDW 16.1 09.6 - 04.5 %   Platelets 231 150 - 400 K/uL  Comprehensive metabolic panel     Status: Abnormal   Collection Time: 10/08/14  4:55 PM  Result Value Ref Range   Sodium 135 135 - 145 mmol/L   Potassium 3.5 3.5 - 5.1 mmol/L   Chloride 111 101 - 111 mmol/L   CO2 21 (L) 22 - 32 mmol/L   Glucose, Bld 70 65 - 99 mg/dL   BUN 8 6 - 20 mg/dL   Creatinine, Ser 4.09 0.44 - 1.00 mg/dL   Calcium 8.7 (L) 8.9 - 10.3 mg/dL   Total Protein 6.7 6.5 - 8.1 g/dL   Albumin 2.6 (L) 3.5 - 5.0 g/dL   AST 15 15 - 41 U/L   ALT 11 (L) 14 - 54 U/L   Alkaline Phosphatase 132 (H) 38 - 126 U/L   Total Bilirubin 0.4  0.3 - 1.2 mg/dL   GFR calc non Af Amer >60 >60 mL/min   GFR calc Af Amer >60 >60 mL/min   Anion gap 3 (L) 5 - 15  Glucose, capillary     Status: Abnormal   Collection Time: 10/08/14  5:26 PM  Result Value Ref Range   Glucose-Capillary 64 (L) 65 - 99 mg/dL   Comment 1 Notify RN    Comment 2 Document in Chart   Glucose, capillary     Status: None   Collection Time: 10/08/14  5:58 PM  Result Value Ref Range   Glucose-Capillary 90 65 - 99 mg/dL     PEx Gen: NAD, A/Ox3, interacts appropriately HEENT: NCAT, MMM, PERRL CV: RRR, LE edema noted Abd: gravid, tenderness noted in RLQ and LLQ DTR: 3+ patellar Skin: no rashes/lesions  Assessment/Plan Patient's mucus discharge is likely physiologic discharge with pregnancy given its small volume and lack of any other symptoms. Her back pain and abdominal pain are also likely from having a gravid uterus. Given her lack of neurologic findings, shooting pain, and impairment of function, patient does not require a work up at this time.  Her mildly elevated BPs, swelling in the legs, and visual disturbances are concerning for pre-eclampsia. Gestational Hypertension Deliver pt at 37 weeks Twice weekly testing Plan - CBC - CMP to check renal/liver function - U/A with protein:creatinine ratio  Pt has an appointment at [facility] on 10/12/14 Discharge to home with preeclampsia precautions - Consider betamethasone depending on results   Illene Bolus CNM 10/08/2014 612pm

## 2014-10-08 NOTE — Discharge Instructions (Signed)

## 2014-10-12 ENCOUNTER — Ambulatory Visit (INDEPENDENT_AMBULATORY_CARE_PROVIDER_SITE_OTHER): Payer: Medicaid Other | Admitting: *Deleted

## 2014-10-12 VITALS — BP 135/70 | HR 84

## 2014-10-12 DIAGNOSIS — E119 Type 2 diabetes mellitus without complications: Secondary | ICD-10-CM | POA: Diagnosis not present

## 2014-10-12 DIAGNOSIS — O24913 Unspecified diabetes mellitus in pregnancy, third trimester: Secondary | ICD-10-CM

## 2014-10-12 NOTE — Progress Notes (Signed)
NST reviewed and reactive.  Jocelyne Reinertsen L. Harraway-Smith, M.D., FACOG    

## 2014-10-16 ENCOUNTER — Ambulatory Visit (INDEPENDENT_AMBULATORY_CARE_PROVIDER_SITE_OTHER): Payer: Medicaid Other | Admitting: Family Medicine

## 2014-10-16 ENCOUNTER — Inpatient Hospital Stay (HOSPITAL_COMMUNITY)
Admission: AD | Admit: 2014-10-16 | Discharge: 2014-10-16 | Disposition: A | Discharge status: Home / Self Care | Payer: Medicaid Other | Source: Ambulatory Visit | Attending: Obstetrics & Gynecology | Admitting: Obstetrics & Gynecology

## 2014-10-16 ENCOUNTER — Other Ambulatory Visit: Payer: Self-pay | Admitting: Family Medicine

## 2014-10-16 ENCOUNTER — Encounter (HOSPITAL_COMMUNITY): Payer: Self-pay | Admitting: *Deleted

## 2014-10-16 ENCOUNTER — Encounter: Payer: Self-pay | Admitting: Family Medicine

## 2014-10-16 VITALS — BP 155/92 | HR 83 | Wt 231.7 lb

## 2014-10-16 DIAGNOSIS — O99213 Obesity complicating pregnancy, third trimester: Secondary | ICD-10-CM

## 2014-10-16 DIAGNOSIS — E119 Type 2 diabetes mellitus without complications: Secondary | ICD-10-CM | POA: Insufficient documentation

## 2014-10-16 DIAGNOSIS — O35BXX0 Maternal care for other (suspected) fetal abnormality and damage, fetal cardiac anomalies, not applicable or unspecified: Secondary | ICD-10-CM

## 2014-10-16 DIAGNOSIS — O34219 Maternal care for unspecified type scar from previous cesarean delivery: Secondary | ICD-10-CM

## 2014-10-16 DIAGNOSIS — Z3A35 35 weeks gestation of pregnancy: Secondary | ICD-10-CM | POA: Diagnosis not present

## 2014-10-16 DIAGNOSIS — Z794 Long term (current) use of insulin: Secondary | ICD-10-CM | POA: Insufficient documentation

## 2014-10-16 DIAGNOSIS — O10913 Unspecified pre-existing hypertension complicating pregnancy, third trimester: Secondary | ICD-10-CM

## 2014-10-16 DIAGNOSIS — O24913 Unspecified diabetes mellitus in pregnancy, third trimester: Secondary | ICD-10-CM

## 2014-10-16 DIAGNOSIS — I10 Essential (primary) hypertension: Secondary | ICD-10-CM

## 2014-10-16 DIAGNOSIS — O24113 Pre-existing diabetes mellitus, type 2, in pregnancy, third trimester: Secondary | ICD-10-CM | POA: Insufficient documentation

## 2014-10-16 DIAGNOSIS — R51 Headache: Secondary | ICD-10-CM | POA: Diagnosis present

## 2014-10-16 DIAGNOSIS — O358XX0 Maternal care for other (suspected) fetal abnormality and damage, not applicable or unspecified: Secondary | ICD-10-CM

## 2014-10-16 DIAGNOSIS — O360131 Maternal care for anti-D [Rh] antibodies, third trimester, fetus 1: Secondary | ICD-10-CM

## 2014-10-16 DIAGNOSIS — O0991 Supervision of high risk pregnancy, unspecified, first trimester: Secondary | ICD-10-CM

## 2014-10-16 LAB — POCT URINALYSIS DIP (DEVICE)
Bilirubin Urine: NEGATIVE
Glucose, UA: NEGATIVE mg/dL
Ketones, ur: 15 mg/dL — AB
Nitrite: NEGATIVE
Protein, ur: 100 mg/dL — AB
Specific Gravity, Urine: 1.025 (ref 1.005–1.030)
UROBILINOGEN UA: 1 mg/dL (ref 0.0–1.0)
pH: 6.5 (ref 5.0–8.0)

## 2014-10-16 LAB — CBC
HCT: 32 % — ABNORMAL LOW (ref 36.0–46.0)
Hemoglobin: 10.4 g/dL — ABNORMAL LOW (ref 12.0–15.0)
MCH: 25.7 pg — ABNORMAL LOW (ref 26.0–34.0)
MCHC: 32.5 g/dL (ref 30.0–36.0)
MCV: 79 fL (ref 78.0–100.0)
Platelets: 203 10*3/uL (ref 150–400)
RBC: 4.05 MIL/uL (ref 3.87–5.11)
RDW: 16.2 % — AB (ref 11.5–15.5)
WBC: 6.3 10*3/uL (ref 4.0–10.5)

## 2014-10-16 LAB — OB RESULTS CONSOLE GBS: GBS: POSITIVE

## 2014-10-16 LAB — COMPREHENSIVE METABOLIC PANEL
ALT: 10 U/L — ABNORMAL LOW (ref 14–54)
AST: 13 U/L — AB (ref 15–41)
Albumin: 2.4 g/dL — ABNORMAL LOW (ref 3.5–5.0)
Alkaline Phosphatase: 133 U/L — ABNORMAL HIGH (ref 38–126)
Anion gap: 6 (ref 5–15)
BUN: 5 mg/dL — AB (ref 6–20)
CALCIUM: 8.5 mg/dL — AB (ref 8.9–10.3)
CO2: 19 mmol/L — AB (ref 22–32)
CREATININE: 0.63 mg/dL (ref 0.44–1.00)
Chloride: 111 mmol/L (ref 101–111)
GFR calc non Af Amer: >60 mL/min (ref >60)
Glucose, Bld: 98 mg/dL (ref 65–99)
POTASSIUM: 3.5 mmol/L (ref 3.5–5.1)
Sodium: 136 mmol/L (ref 135–145)
Total Bilirubin: 0.4 mg/dL (ref 0.3–1.2)
Total Protein: 6.2 g/dL — ABNORMAL LOW (ref 6.5–8.1)

## 2014-10-16 LAB — PROTEIN / CREATININE RATIO, URINE
Creatinine, Urine: 143 mg/dL
PROTEIN CREATININE RATIO: 0.27 mg/mg{creat} — AB (ref 0.00–0.15)
Total Protein, Urine: 38 mg/dL

## 2014-10-16 MED ORDER — ACETAMINOPHEN 500 MG PO TABS
1000.0000 mg | ORAL_TABLET | Freq: Once | ORAL | Status: AC
  Administered 2014-10-16: 1000 mg via ORAL
  Filled 2014-10-16: qty 2

## 2014-10-16 NOTE — Progress Notes (Signed)
Large leuks and trace Hgb on udip.  Pt reports slight H/A today, denies visual disturbances.

## 2014-10-16 NOTE — MAU Note (Signed)
Food tray given to pt

## 2014-10-16 NOTE — MAU Note (Signed)
Pt sent to MAU for evaluation for gestational hypertension. Reports a headache, denies any vaginal bleeding or LOF. States baby is active

## 2014-10-16 NOTE — MAU Note (Signed)
Sent up from clinic with elevated BP.   Has headache, Tylenol not helping. Denies visual changes or epigastric pain

## 2014-10-16 NOTE — Progress Notes (Signed)
Subjective:   Serena ColonelCrystal D Zarling is a 29 y.o. [id number] at 8674w6d being seen today for her obstetrical visit.  Patient reports headache.  No improved with tylenol.  Headache started this morning. Contractions: Contractions: Irregular.   Vaginal Bleeding Vag. Bleeding: None.   Fetal Movement: Movement: Present.   Denies vaginal bleeding or leaking of fluid.  Reports good fetal movement.  Forgot CBG book.  Reports blood sugars as "fine", although fasting blood sugar was 109.   Has been taking insulin as prescribed: NPH 45 in AM and 20 at bedtime.  Regular: 25 with breakfast, 20 with lunch.  Reports one hypoglycemic episode.  The following portions of the patient's history were reviewed and updated as appropriate: allergies, current medications, past family history, past medical history, past social history, past surgical history and problem list.   Objective:  BP 155/92 mmHg  Pulse 83  Wt 231 lb 11.2 oz (105.098 kg)  LMP 02/23/2014 Fetal Heart Rate: Fetal Heart Rate (bpm): NST  Fundal Height:    Fetal Movement: Movement: Present  Fetal Presentation:  Vertex   Abdomen: Soft, gravid, appropriate for gestational age.  Pain/Pressure: Pain/Pressure: Present     Vaginal: Vaginal Bleeding Vag. Bleeding: None   Discharge:    Cervix: Dilation:   Effacement:   Station:    Extremities: Edema: Edema: Mild pitting, slight indentation   Urinalysis: Protein: Urine Protein: 2+ Glucose: Urine Glucose: Negative Results for orders placed or performed in visit on 10/16/14 (from the past 24 hour(s))  POCT urinalysis dip (device)     Status: Abnormal   Collection Time: 10/16/14  8:32 AM  Result Value Ref Range   Glucose, UA NEGATIVE NEGATIVE mg/dL   Bilirubin Urine NEGATIVE NEGATIVE   Ketones, ur 15 (A) NEGATIVE mg/dL   Specific Gravity, Urine 1.025 1.005 - 1.030   Hgb urine dipstick TRACE (A) NEGATIVE   pH 6.5 5.0 - 8.0   Protein, ur 100 (A) NEGATIVE mg/dL   Urobilinogen, UA 1.0 0.0 - 1.0 mg/dL   Nitrite NEGATIVE NEGATIVE   Leukocytes, UA LARGE (A) NEGATIVE     Assessment and Plan:   Pregnancy:  [id number] at 6274w6d  1. High-risk pregnancy in first trimester NST reactive  2. Chronic hypertension complicating or reason for care during pregnancy, third trimester BP elevated today.  Headache concerning for preeclampsia - sent to MAU. - Fetal nonstress test - Culture, beta strep (group b only) - GC/Chlamydia Probe Amp  3. Diabetes mellitus affecting pregnancy, third trimester Pt to bring book at NST.  Will need to be reviewed. - Fetal nonstress test   Preterm labor symptoms: vaginal bleeding, contractions and leaking of fluid reviewed in detail.  Fetal movement precautions reviewed.  Follow up in Thursday for NST pending results in MAU.Levie Heritage.   Averill Pons J Kimaya Whitlatch, DO

## 2014-10-16 NOTE — Discharge Instructions (Signed)
You were seen at [facility] for elevated blood pressure and headache. Your headache improved with tylenol and food. We monitored your blood pressure several hours and it improved significantly. Please check your blood pressure at home at least once daily until you are seen in clinic. If your blood pressure is > 160/100 you should be seen immediately. You should keep your appointment in clinic on Thursday. If you develop headache, visual changes, abdominal pain, shortness of breath or other concerning symptoms please return to the MAU for blood pressure check.   If you develop signs of labor (contractions, loss of fluid, vaginal bleeding, decreased fetal movement) you should be evaluated before Thursday.

## 2014-10-16 NOTE — MAU Provider Note (Signed)
History     CSN: [id number]  Arrival date and time: 10/16/14 0950   First Provider Initiated Contact with Patient 10/16/14 1051      Chief Complaint  Patient presents with  . Hypertension  . Headache   HPI Comments: [name] [name] is a 29 y.o. G4P1021 at 27w6dsent from high risk clinic visit due to new onset headache, worsening chronic hypertension and 2+ proteinuria. She is being followed for type 2 diabetes on insulin and chronic hypertension. She woke with a frontal headache she describes as band like and moderate intensity. She got no relief from Tylenol 1 tablet. She is also had some vomiting today which she said has been ongoing throughout her pregnancy. At present she is hungry asking to eat. She denies visual symptoms epigastric or right upper quadrant pain. She states her dependent edema is improved.    Hypertension This is a chronic problem. The current episode started more than 1 year ago. The problem has been gradually worsening since onset. The problem is controlled. Associated symptoms include headaches. Pertinent negatives include no anxiety. There are no associated agents to hypertension. Risk factors for coronary artery disease include obesity, dyslipidemia and sedentary lifestyle. There are no compliance problems.   Headache  Her past medical history is significant for hypertension.    OB History    Gravida Para Term Preterm AB TAB SAB Ectopic Multiple Living   4 1 1  2  2   1     Hx preE   Past Medical History  Diagnosis Date  . Hypertension   . Herpes simplex without mention of complication   . Obesity   . Diabetes mellitus     type II  . Irregular menses   . Abnormal Pap smear   . GBS (group B streptococcus) UTI complicating pregnancy     Past Surgical History  Procedure Laterality Date  . Cesarean section  01/30/2011    Procedure: CESAREAN SECTION;  Surgeon: JJonnie Kind MD;  Location: [facility];  Service: Gynecology;  Laterality: N/A;    Family  History  Problem Relation Age of Onset  . Diabetes Paternal Grandmother   . Hypertension Paternal Grandmother   . Diabetes Maternal Grandmother   . Diabetes Father   . Cancer Paternal Uncle     History  Substance Use Topics  . Smoking status: Never Smoker   . Smokeless tobacco: Never Used  . Alcohol Use: No    Allergies: No Known Allergies  Prescriptions prior to admission  Medication Sig Dispense Refill Last Dose  . acetaminophen (TYLENOL) 325 MG tablet Take 325 mg by mouth every 6 (six) hours as needed.   10/16/2014 at Unknown time  . Blood Glucose Monitoring Suppl (ACCU-CHEK NANO SMARTVIEW) W/DEVICE KIT Use up to 6 times daily to test blood sugars 1 kit 0 10/16/2014 at Unknown time  . insulin NPH Human (HUMULIN N,NOVOLIN N) 100 UNIT/ML injection Inject 45 Units into the skin daily before breakfast. 45 units  before breakfast, 20 units SQ @@ bedtime   10/16/2014 at Unknown time  . insulin regular (NOVOLIN R,HUMULIN R) 100 units/mL injection Inject 25 Units into the skin 3 (three) times daily before meals. 25 units SQ @ breakfast, 20 units SQ @ lunch   10/16/2014 at Unknown time  . Prenatal Vit-Fe Fumarate-FA (PRENATAL MULTIVITAMIN) TABS tablet Take 1 tablet by mouth daily at 12 noon.   10/16/2014 at Unknown time  . ACCU-CHEK FASTCLIX LANCETS MISC Check sugar up to 6 x  daily 204 each 3 Taking  . Insulin Syringe-Needle U-100 (INSULIN SYRINGE 1CC/31GX5/16") 31G X 5/16" 1 ML MISC 100 each by Does not apply route 4 (four) times daily. 100 each 0 Taking    Review of Systems  Neurological: Positive for headaches.   Physical Exam   Blood pressure 162/112, pulse 77, last menstrual period 02/23/2014. Filed Vitals:   10/16/14 1018 10/16/14 1032 10/16/14 1047 10/16/14 1102  BP: 162/112 134/72 138/71 152/102  Pulse: 77 69 77 79   Physical Exam  Nursing note and vitals reviewed. Constitutional:  Morbidly obese  HENT:  Head: Normocephalic.  Eyes: Pupils are equal, round, and reactive to  light.  Neck: Normal range of motion.  Cardiovascular: Normal rate.   Respiratory: Effort normal.  GI: Soft. There is no tenderness.  Musculoskeletal: Normal range of motion. She exhibits edema.  2-3+ pretibial edema. Patellar DTRs 1+  Skin: Skin is warm and dry.    MAU Course  Procedures Results for orders placed or performed during the hospital encounter of 10/16/14 (from the past 24 hour(s))  Protein / creatinine ratio, urine     Status: Abnormal   Collection Time: 10/16/14  9:55 AM  Result Value Ref Range   Creatinine, Urine 143.00 mg/dL   Total Protein, Urine 38 mg/dL   Protein Creatinine Ratio 0.27 (H) 0.00 - 0.15 mg/mg[Cre]  CBC     Status: Abnormal   Collection Time: 10/16/14 10:40 AM  Result Value Ref Range   WBC 6.3 4.0 - 10.5 K/uL   RBC 4.05 3.87 - 5.11 MIL/uL   Hemoglobin 10.4 (L) 12.0 - 15.0 g/dL   HCT 32.0 (L) 36.0 - 46.0 %   MCV 79.0 78.0 - 100.0 fL   MCH 25.7 (L) 26.0 - 34.0 pg   MCHC 32.5 30.0 - 36.0 g/dL   RDW 16.2 (H) 11.5 - 15.5 %   Platelets 203 150 - 400 K/uL  Comprehensive metabolic panel     Status: Abnormal   Collection Time: 10/16/14 10:40 AM  Result Value Ref Range   Sodium 136 135 - 145 mmol/L   Potassium 3.5 3.5 - 5.1 mmol/L   Chloride 111 101 - 111 mmol/L   CO2 19 (L) 22 - 32 mmol/L   Glucose, Bld 98 65 - 99 mg/dL   BUN 5 (L) 6 - 20 mg/dL   Creatinine, Ser 0.63 0.44 - 1.00 mg/dL   Calcium 8.5 (L) 8.9 - 10.3 mg/dL   Total Protein 6.2 (L) 6.5 - 8.1 g/dL   Albumin 2.4 (L) 3.5 - 5.0 g/dL   AST 13 (L) 15 - 41 U/L   ALT 10 (L) 14 - 54 U/L   Alkaline Phosphatase 133 (H) 38 - 126 U/L   Total Bilirubin 0.4 0.3 - 1.2 mg/dL   GFR calc non Af Amer >60 >60 mL/min   GFR calc Af Amer >60 >60 mL/min   Anion gap 6 5 - 15  11:50: acetaminophen 1000 mg given. Continue serial BPs. Diabetic diet  Will transfer care to Dr. Angelica Chessman  12:30 Assumed care from [facility]  Headache now resolved with tylenol. BP currently 144/70. One severe range pressure  documented earlier this morning, the rest are normal to mild range. Labs stable. UPC 0.27 which is slightly up from prior. Discussed with Dr. Ihor Dow who agrees with plan for discharge with close follow up.   Assessment and Plan   [name] [name] is a 29 y.o. [id number] at 36w6dwith a history of chronic hypertension  vs gestational hypertension here with elevated blood pressure and headache. Headache resolved with PO fluids, food, and tylenol. One documented severe range pressure, the remainder have been normal to mild range. Labs stable. UPC 0.27.  - Discussed with Dr. Ihor Dow who agrees with plans for discharge and close follow up.  - Patient to check blood pressure at home over the next few days. Strict precautions given for evaluation if > 160/100.  - Discussed pre-eclampsia precautions.  - Follow up in clinic on Thursday as scheduled for NST, blood pressure check.  - Induction of labor scheduled when she will be [redacted]w[redacted]d   MShelbie Hutching6/10/2014, 1:42 PM

## 2014-10-17 LAB — GC/CHLAMYDIA PROBE AMP
CT PROBE, AMP APTIMA: NEGATIVE
GC Probe RNA: NEGATIVE

## 2014-10-18 LAB — CULTURE, BETA STREP (GROUP B ONLY)

## 2014-10-19 ENCOUNTER — Ambulatory Visit (INDEPENDENT_AMBULATORY_CARE_PROVIDER_SITE_OTHER): Payer: Medicaid Other | Admitting: *Deleted

## 2014-10-19 VITALS — BP 143/94 | HR 79

## 2014-10-19 DIAGNOSIS — E119 Type 2 diabetes mellitus without complications: Secondary | ICD-10-CM

## 2014-10-19 DIAGNOSIS — I10 Essential (primary) hypertension: Secondary | ICD-10-CM | POA: Diagnosis not present

## 2014-10-19 DIAGNOSIS — O10913 Unspecified pre-existing hypertension complicating pregnancy, third trimester: Secondary | ICD-10-CM | POA: Diagnosis present

## 2014-10-19 DIAGNOSIS — O24913 Unspecified diabetes mellitus in pregnancy, third trimester: Secondary | ICD-10-CM | POA: Diagnosis not present

## 2014-10-19 NOTE — Patient Instructions (Signed)
Return to clinic for any obstetric concerns or go to [facility] for evaluation  

## 2014-10-19 NOTE — Progress Notes (Signed)
NST performed today was reviewed and was found to be reactive.  AFI normal at 8.6 cm.  Continue recommended antenatal testing and prenatal care. Concerned about possible superimposed preeclampsia.  If urine protein/creatinine ratio is ? 0.3, she will meet criteria for preeclampsia and will need IOL at 37 weeks or earlier for any severe features.

## 2014-10-19 NOTE — Progress Notes (Signed)
Consult w/Dr. Macon Large - orders received for urine protein/creatinine ratio.  Pt advised to rest for remainder of today. If she develops severe H/A or visual disturbances she should go to MAU for evaluation.

## 2014-10-20 ENCOUNTER — Inpatient Hospital Stay (HOSPITAL_COMMUNITY)
Admission: AD | Admit: 2014-10-20 | Discharge: 2014-10-25 | DRG: 765 | Disposition: A | Discharge status: Home / Self Care | Payer: Medicaid Other | Source: Ambulatory Visit | Attending: Family Medicine | Admitting: Family Medicine

## 2014-10-20 ENCOUNTER — Encounter (HOSPITAL_COMMUNITY): Payer: Self-pay | Admitting: *Deleted

## 2014-10-20 DIAGNOSIS — O34219 Maternal care for unspecified type scar from previous cesarean delivery: Secondary | ICD-10-CM

## 2014-10-20 DIAGNOSIS — Z302 Encounter for sterilization: Secondary | ICD-10-CM

## 2014-10-20 DIAGNOSIS — O358XX0 Maternal care for other (suspected) fetal abnormality and damage, not applicable or unspecified: Secondary | ICD-10-CM

## 2014-10-20 DIAGNOSIS — O1410 Severe pre-eclampsia, unspecified trimester: Secondary | ICD-10-CM | POA: Diagnosis present

## 2014-10-20 DIAGNOSIS — E119 Type 2 diabetes mellitus without complications: Secondary | ICD-10-CM | POA: Diagnosis present

## 2014-10-20 DIAGNOSIS — Z794 Long term (current) use of insulin: Secondary | ICD-10-CM | POA: Diagnosis not present

## 2014-10-20 DIAGNOSIS — O99824 Streptococcus B carrier state complicating childbirth: Secondary | ICD-10-CM | POA: Diagnosis present

## 2014-10-20 DIAGNOSIS — O1413 Severe pre-eclampsia, third trimester: Secondary | ICD-10-CM | POA: Diagnosis not present

## 2014-10-20 DIAGNOSIS — O119 Pre-existing hypertension with pre-eclampsia, unspecified trimester: Secondary | ICD-10-CM

## 2014-10-20 DIAGNOSIS — O2412 Pre-existing diabetes mellitus, type 2, in childbirth: Secondary | ICD-10-CM | POA: Diagnosis present

## 2014-10-20 DIAGNOSIS — O3421 Maternal care for scar from previous cesarean delivery: Secondary | ICD-10-CM | POA: Diagnosis present

## 2014-10-20 DIAGNOSIS — O1493 Unspecified pre-eclampsia, third trimester: Secondary | ICD-10-CM | POA: Diagnosis not present

## 2014-10-20 DIAGNOSIS — O35BXX0 Maternal care for other (suspected) fetal abnormality and damage, fetal cardiac anomalies, not applicable or unspecified: Secondary | ICD-10-CM

## 2014-10-20 DIAGNOSIS — Z6841 Body Mass Index (BMI) 40.0 and over, adult: Secondary | ICD-10-CM | POA: Diagnosis not present

## 2014-10-20 DIAGNOSIS — Z833 Family history of diabetes mellitus: Secondary | ICD-10-CM

## 2014-10-20 DIAGNOSIS — O99214 Obesity complicating childbirth: Secondary | ICD-10-CM | POA: Diagnosis present

## 2014-10-20 DIAGNOSIS — O113 Pre-existing hypertension with pre-eclampsia, third trimester: Secondary | ICD-10-CM | POA: Diagnosis present

## 2014-10-20 DIAGNOSIS — Z8249 Family history of ischemic heart disease and other diseases of the circulatory system: Secondary | ICD-10-CM | POA: Diagnosis not present

## 2014-10-20 DIAGNOSIS — Z3A36 36 weeks gestation of pregnancy: Secondary | ICD-10-CM | POA: Diagnosis not present

## 2014-10-20 DIAGNOSIS — O360131 Maternal care for anti-D [Rh] antibodies, third trimester, fetus 1: Secondary | ICD-10-CM

## 2014-10-20 DIAGNOSIS — O99213 Obesity complicating pregnancy, third trimester: Secondary | ICD-10-CM

## 2014-10-20 LAB — COMPREHENSIVE METABOLIC PANEL
ALT: 10 U/L — ABNORMAL LOW (ref 14–54)
AST: 14 U/L — ABNORMAL LOW (ref 15–41)
Albumin: 2.7 g/dL — ABNORMAL LOW (ref 3.5–5.0)
Alkaline Phosphatase: 162 U/L — ABNORMAL HIGH (ref 38–126)
Anion gap: 9 (ref 5–15)
BILIRUBIN TOTAL: 0.5 mg/dL (ref 0.3–1.2)
BUN: 5 mg/dL — AB (ref 6–20)
CALCIUM: 8.8 mg/dL — AB (ref 8.9–10.3)
CHLORIDE: 108 mmol/L (ref 101–111)
CO2: 20 mmol/L — AB (ref 22–32)
Creatinine, Ser: 0.62 mg/dL (ref 0.44–1.00)
GFR calc Af Amer: >60 mL/min (ref >60)
GLUCOSE: 123 mg/dL — AB (ref 65–99)
Potassium: 3.3 mmol/L — ABNORMAL LOW (ref 3.5–5.1)
Sodium: 137 mmol/L (ref 135–145)
Total Protein: 6.6 g/dL (ref 6.5–8.1)

## 2014-10-20 LAB — CBC
HCT: 32.4 % — ABNORMAL LOW (ref 36.0–46.0)
Hemoglobin: 10.5 g/dL — ABNORMAL LOW (ref 12.0–15.0)
MCH: 25.6 pg — ABNORMAL LOW (ref 26.0–34.0)
MCHC: 32.4 g/dL (ref 30.0–36.0)
MCV: 79 fL (ref 78.0–100.0)
Platelets: 221 10*3/uL (ref 150–400)
RBC: 4.1 MIL/uL (ref 3.87–5.11)
RDW: 16.4 % — AB (ref 11.5–15.5)
WBC: 7.2 10*3/uL (ref 4.0–10.5)

## 2014-10-20 LAB — POCT URINALYSIS DIP (DEVICE)
BILIRUBIN URINE: NEGATIVE
GLUCOSE, UA: NEGATIVE mg/dL
Hgb urine dipstick: NEGATIVE
Ketones, ur: NEGATIVE mg/dL
Nitrite: NEGATIVE
Protein, ur: 30 mg/dL — AB
Specific Gravity, Urine: 1.015 (ref 1.005–1.030)
Urobilinogen, UA: 1 mg/dL (ref 0.0–1.0)
pH: 6.5 (ref 5.0–8.0)

## 2014-10-20 LAB — TYPE AND SCREEN
ABO/RH(D): B NEG
ANTIBODY SCREEN: NEGATIVE

## 2014-10-20 LAB — PROTEIN / CREATININE RATIO, URINE
Creatinine, Urine: 287 mg/dL
Protein Creatinine Ratio: 0.35 mg/mg{Cre} — ABNORMAL HIGH (ref 0.00–0.15)
Total Protein, Urine: 100 mg/dL

## 2014-10-20 LAB — GLUCOSE, CAPILLARY: Glucose-Capillary: 86 mg/dL (ref 65–99)

## 2014-10-20 MED ORDER — INSULIN NPH (HUMAN) (ISOPHANE) 100 UNIT/ML ~~LOC~~ SUSP
20.0000 [IU] | Freq: Every day | SUBCUTANEOUS | Status: DC
  Administered 2014-10-20: 20 [IU] via SUBCUTANEOUS

## 2014-10-20 MED ORDER — CALCIUM CARBONATE ANTACID 500 MG PO CHEW
2.0000 | CHEWABLE_TABLET | ORAL | Status: DC | PRN
  Administered 2014-10-21 (×2): 400 mg via ORAL
  Filled 2014-10-20 (×5): qty 2

## 2014-10-20 MED ORDER — INSULIN ASPART 100 UNIT/ML ~~LOC~~ SOLN: 25.0000 [IU] | Freq: Every day | SUBCUTANEOUS | Status: DC

## 2014-10-20 MED ORDER — INSULIN NPH (HUMAN) (ISOPHANE) 100 UNIT/ML ~~LOC~~ SUSP
45.0000 [IU] | Freq: Every day | SUBCUTANEOUS | Status: DC
  Filled 2014-10-20: qty 10

## 2014-10-20 MED ORDER — PRENATAL MULTIVITAMIN CH
1.0000 | ORAL_TABLET | Freq: Every day | ORAL | Status: DC
  Filled 2014-10-20 (×3): qty 1

## 2014-10-20 MED ORDER — PRENATAL MULTIVITAMIN CH: 1.0000 | ORAL_TABLET | Freq: Every day | ORAL | Status: DC

## 2014-10-20 MED ORDER — INSULIN ASPART 100 UNIT/ML ~~LOC~~ SOLN
20.0000 [IU] | Freq: Every day | SUBCUTANEOUS | Status: DC
  Administered 2014-10-20: 20 [IU] via SUBCUTANEOUS

## 2014-10-20 MED ORDER — VALACYCLOVIR HCL 500 MG PO TABS
500.0000 mg | ORAL_TABLET | Freq: Every day | ORAL | Status: DC
  Administered 2014-10-20 – 2014-10-22 (×3): 500 mg via ORAL
  Filled 2014-10-20 (×4): qty 1

## 2014-10-20 MED ORDER — INSULIN REGULAR HUMAN 100 UNIT/ML IJ SOLN: 20.0000 [IU] | Freq: Two times a day (BID) | INTRAMUSCULAR | Status: DC

## 2014-10-20 MED ORDER — ZOLPIDEM TARTRATE 5 MG PO TABS: 5.0000 mg | ORAL_TABLET | Freq: Every evening | ORAL | Status: DC | PRN

## 2014-10-20 MED ORDER — INSULIN NPH (HUMAN) (ISOPHANE) 100 UNIT/ML ~~LOC~~ SUSP: 20.0000 [IU] | Freq: Two times a day (BID) | SUBCUTANEOUS | Status: DC

## 2014-10-20 MED ORDER — ACETAMINOPHEN 325 MG PO TABS
650.0000 mg | ORAL_TABLET | ORAL | Status: DC | PRN
  Administered 2014-10-20: 650 mg via ORAL
  Filled 2014-10-20: qty 2

## 2014-10-20 MED ORDER — ACETAMINOPHEN 500 MG PO TABS
1000.0000 mg | ORAL_TABLET | Freq: Once | ORAL | Status: AC
  Administered 2014-10-20: 1000 mg via ORAL
  Filled 2014-10-20: qty 2

## 2014-10-20 MED ORDER — DOCUSATE SODIUM 100 MG PO CAPS
100.0000 mg | ORAL_CAPSULE | Freq: Every day | ORAL | Status: DC
  Administered 2014-10-20: 100 mg via ORAL
  Filled 2014-10-20 (×6): qty 1

## 2014-10-20 NOTE — H&P (Signed)
[name] [name] is a 29 y.o. female presenting for headache, nausea and elevated blood pressures.   History   Patient is 29 y.o. [id number] [redacted]w[redacted]d here with complaints of headache x 1 day, nausea with vomiting and intermittent contractions. Pregnancy has been complicated by chronic hypertension with question of superimposed pre-eclampsia as well as chronic diabetes mellitus on insulin. Patient reports she had a headache at her OB visit yesterday but her BP was okay. She continues to have persistent headache that she describes as bandlike across her whole head. She has thrown up several times today and continues to feel nauseated. She endorses some rare spots in her vision but no blurry vision. No RUQ pain. She feels very swollen throughout her legs and abdomen but this is not acutely worse than it has been. She is 'tired of all the fluid.' Today, she also endorses irregular, mild contractions. She has not checked her BP at home. Reports her blood sugar has been well controlled, fasting this Am of 97.   Patient has been monitored closely for high-risk pregnancy. Her blood pressure has been elevated throughout pregnancy but she has not required anti-hypertensive therapy. Her urine P:C ratio has remained <0.30. Per her OB visit yesterday, if she develops severe features or elevated P:C ratio, will need IOL at or before 37 week  +FM, denies LOF, VB, vaginal discharge.  OB History    Gravida Para Term Preterm AB TAB SAB Ectopic Multiple Living   4 1 1  2  2   1      Past Medical History  Diagnosis Date  . Hypertension   . Herpes simplex without mention of complication   . Obesity   . Diabetes mellitus     type II  . Irregular menses   . Abnormal Pap smear   . GBS (group B streptococcus) UTI complicating pregnancy    Past Surgical History  Procedure Laterality Date  . Cesarean section  01/30/2011    Procedure: CESAREAN SECTION;  Surgeon: Tilda Burrow, MD;  Location: [facility];  Service: Gynecology;   Laterality: N/A;  . Wisdom tooth extraction     Family History: family history includes Cancer in her paternal uncle; Diabetes in her father, maternal grandmother, and paternal grandmother; Hypertension in her paternal grandmother. Social History:  reports that she has never smoked. She has never used smokeless tobacco. She reports that she does not drink alcohol or use illicit drugs.   Prenatal Transfer Tool  Maternal Diabetes: Yes:  Diabetes Type:  Pre-pregnancy, Insulin/Medication controlled Genetic Screening: Normal Maternal Ultrasounds/Referrals: Normal Fetal Ultrasounds or other Referrals:  None Maternal Substance Abuse:  No Significant Maternal Medications:  Meds include: Other: Insulin Significant Maternal Lab Results:  None Other Comments:  None  ROS Review of Systems  Constitutional: Negative for fever and chills.  Eyes: Positive for photophobia. Negative for blurred vision and double vision.  Respiratory: Negative for shortness of breath.  Cardiovascular: Positive for leg swelling. Negative for chest pain.  Gastrointestinal: Positive for nausea and vomiting. Negative for heartburn, diarrhea and constipation.  Genitourinary: Negative for dysuria and urgency.   No contractions, vaginal bleeding, itching or discharge  Skin: Negative for itching and rash.  Neurological: Positive for headaches. Negative for dizziness.    Blood pressure 158/95, pulse 80, resp. rate 20, weight 106.595 kg (235 lb), last menstrual period 02/23/2014, SpO2 100 %. Exam Physical Exam  Physical Exam  Constitutional: She is oriented to person, place, and time. She appears well-developed and well-nourished.  HENT:  Head: Normocephalic and atraumatic.  Eyes: Conjunctivae are normal.  Neck: Normal range of motion.  Cardiovascular: Normal rate, regular rhythm, normal heart sounds and intact distal pulses.  No murmur heard. Respiratory: Effort normal and breath sounds normal. No respiratory  distress. She has no wheezes.  GI: Soft. Bowel sounds are normal.  Mild edema of anterior abdominal wall, tender  Musculoskeletal: Normal range of motion.  2+ pitting edema bilateral LE to the knee.  Neurological: She is alert and oriented to person, place, and time. She has normal reflexes. No cranial nerve deficit.  Skin: Skin is warm and dry.  Psychiatric: She has a normal mood and affect.  FHT: Cat I with baseline 140, + accelerations, - decelerations Toco: No contractions noted  Prenatal labs: ABO, Rh: B/NEG/-- (12/21 1102) Antibody: NEG (12/21 1102) Rubella: 3.01 (12/21 1102) RPR: NON REAC (04/11 1109)  HBsAg: NEGATIVE (12/21 1102)  HIV: NONREACTIVE (04/11 1109)  GBS:     Assessment/Plan: 29 y.o. G4P1021 at [redacted]w[redacted]d with history of chronic hypertension and diabetes mellitus now with preeclampsia by criteria of persistently elevated blood pressure and elevated urine protein:creatinine ratio of 0.35. Patient continues to have elevated BP during evaluation in triage 145-163/81-85. On review, she has had BP near this range at last several prenatal visits. However, now with elevated protein:creatinine ratio. Headache improved from 8 to 5/10 with tylenol given in MAU. No further vomiting. Patient will need induction of labor at 37 weeks. Does not have severe features at this time but will monitor closely for severe features or severe range blood pressures. Will admit to antenatal unit for 24 hour observation of symptoms and BP.  # Admit to antenatal unit # Preeclampsia on chronic HTN. Monitor BP and symptoms closely. Will get 24 hour urine to further evaluate proteinuria. Tylenol for headache.  # DM2. Continue on home insulin regimen. Monitor blood glucose three times daily with meals.  # Continuous fetal monitoring and contraction monitoring  # Diabetic diet  Patient history, exam, assessment and plan discussed with Dr. Debroah Loop and Illene Bolus, CNM   Fabio Asa 10/20/2014, 3:26  PM

## 2014-10-20 NOTE — MAU Note (Signed)
Head's been hurting, took tylenol, no help.  Was up all night with contractions, feeling pressure.

## 2014-10-20 NOTE — MAU Provider Note (Signed)
History     CSN: [id number]  Arrival date and time: 10/20/14 1225   None     Chief Complaint  Patient presents with  . Headache   HPI  Patient is 29 y.o. Q2V9563 57w3dhere with complaints of headache x 1 day, nausea with vomiting and intermittent contractions. Pregnancy has been complicated by chronic hypertension with question of superimposed pre-eclampsia as well as chronic diabetes mellitus on insulin. Patient reports she had a headache at her OB visit yesterday but her BP was okay. She continues to have persistent headache that she describes as bandlike across her whole head. She has thrown up several times today and continues to feel nauseated. She endorses some rare spots in her vision but no blurry vision. No RUQ pain. She feels very swollen throughout her legs and abdomen but this is not acutely worse than it has been. She is 'tired of all the fluid.' Today, she also endorses irregular, mild contractions. She has not checked her BP at home. Reports her blood sugar has been well controlled, fasting this Am of 97.   Patient has been monitored closely for high-risk pregnancy. Her blood pressure has been elevated throughout pregnancy but she has not required anti-hypertensive therapy. Her urine P:C ratio has remained <0.30. Per her OB visit yesterday, if she develops severe features or elevated P:C ratio, will need IOL at or before 37 week  +FM, denies LOF, VB, vaginal discharge.   OB History    Gravida Para Term Preterm AB TAB SAB Ectopic Multiple Living   4 1 1  2  2   1       Past Medical History  Diagnosis Date  . Hypertension   . Herpes simplex without mention of complication   . Obesity   . Diabetes mellitus     type II  . Irregular menses   . Abnormal Pap smear   . GBS (group B streptococcus) UTI complicating pregnancy     Past Surgical History  Procedure Laterality Date  . Cesarean section  01/30/2011    Procedure: CESAREAN SECTION;  Surgeon: JJonnie Kind MD;   [facility];  Service: Gynecology;  Laterality: N/A;  . Wisdom tooth extraction      Family History  Problem Relation Age of Onset  . Diabetes Paternal Grandmother   . Hypertension Paternal Grandmother   . Diabetes Maternal Grandmother   . Diabetes Father   . Cancer Paternal Uncle     History  Substance Use Topics  . Smoking status: Never Smoker   . Smokeless tobacco: Never Used  . Alcohol Use: No    Allergies: No Known Allergies  Prescriptions prior to admission  Medication Sig Dispense Refill Last Dose  . ACCU-CHEK FASTCLIX LANCETS MISC Check sugar up to 6 x daily 204 each 3 Taking  . acetaminophen (TYLENOL) 325 MG tablet Take 325 mg by mouth every 6 (six) hours as needed.   10/16/2014 at Unknown time  . Blood Glucose Monitoring Suppl (ACCU-CHEK NANO SMARTVIEW) W/DEVICE KIT Use up to 6 times daily to test blood sugars 1 kit 0 10/16/2014 at Unknown time  . insulin NPH Human (HUMULIN N,NOVOLIN N) 100 UNIT/ML injection Inject 45 Units into the skin daily before breakfast. 45 units [place] before breakfast, 20 units SQ @@ bedtime   10/16/2014 at Unknown time  . insulin regular (NOVOLIN R,HUMULIN R) 100 units/mL injection Inject 25 Units into the skin 3 (three) times daily before meals. 25 units SQ @ breakfast, 20 units  SQ @ lunch   10/16/2014 at Unknown time  . Insulin Syringe-Needle U-100 (INSULIN SYRINGE 1CC/31GX5/16") 31G X 5/16" 1 ML MISC 100 each by Does not apply route 4 (four) times daily. 100 each 0 Taking  . Prenatal Vit-Fe Fumarate-FA (PRENATAL MULTIVITAMIN) TABS tablet Take 1 tablet by mouth daily at 12 noon.   10/16/2014 at Unknown time    Review of Systems  Constitutional: Negative for fever and chills.  Eyes: Positive for photophobia. Negative for blurred vision and double vision.  Respiratory: Negative for shortness of breath.   Cardiovascular: Positive for leg swelling. Negative for chest pain.  Gastrointestinal: Positive for nausea and vomiting. Negative for heartburn,  diarrhea and constipation.  Genitourinary: Negative for dysuria and urgency.       No contractions, vaginal bleeding, itching or discharge  Skin: Negative for itching and rash.  Neurological: Positive for headaches. Negative for dizziness.   Physical Exam   Blood pressure 145/91, pulse 89, resp. rate 20, weight 106.595 kg (235 lb), last menstrual period 02/23/2014, SpO2 100 %.  Physical Exam  Constitutional: She is oriented to person, place, and time. She appears well-developed and well-nourished.  HENT:  Head: Normocephalic and atraumatic.  Eyes: Conjunctivae are normal.  Neck: Normal range of motion.  Cardiovascular: Normal rate, regular rhythm, normal heart sounds and intact distal pulses.   No murmur heard. Respiratory: Effort normal and breath sounds normal. No respiratory distress. She has no wheezes.  GI: Soft. Bowel sounds are normal.  Mild edema of anterior abdominal wall, tender  Musculoskeletal: Normal range of motion.  2+ pitting edema bilateral LE to the knee.  Neurological: She is alert and oriented to person, place, and time. She has normal reflexes. No cranial nerve deficit.  Skin: Skin is warm and dry.  Psychiatric: She has a normal mood and affect.   FHT: Cat I with baseline 140, + accelerations, - decelerations Toco: No contractions noted  MAU Course  Procedures  MDM 29 y.o. Q4O9629 at 35w3dwith history of chronic hypertension and diabetes mellitus presenting with complaint of persistent headache, nausea, vomiting as well as persistent swelling. BP on evaluation intermittently elevated with readings 165/96, 162/91, 145/91. Symptoms and elevated BP certainly concerning for superimposed preeclampsia. Patient has been monitored for this closely throughout pregnancy. Will get pre-eclampsia labs, give oral tylenol and oral hydration and monitor for improvement. If BP remains significantly high or laboratory evaluation is abnormal, may need induction of labor.     Assessment and Plan  Patient is 29y.o. [id number] persistent HA, N/V and swelling found to have worsening of elevated blood pressure with increased urine P:C Ratio. Patient will be admitted to antenatal for close blood pressure and symptom monitoring. See H&P  MoGovernor Specking/02/2015, 1:24 PM

## 2014-10-21 DIAGNOSIS — O1413 Severe pre-eclampsia, third trimester: Secondary | ICD-10-CM

## 2014-10-21 DIAGNOSIS — O1410 Severe pre-eclampsia, unspecified trimester: Secondary | ICD-10-CM | POA: Diagnosis present

## 2014-10-21 LAB — GLUCOSE, CAPILLARY
GLUCOSE-CAPILLARY: 107 mg/dL — AB (ref 65–99)
GLUCOSE-CAPILLARY: 112 mg/dL — AB (ref 65–99)
GLUCOSE-CAPILLARY: 110 mg/dL — AB (ref 65–99)
GLUCOSE-CAPILLARY: 126 mg/dL — AB (ref 65–99)
Glucose-Capillary: 94 mg/dL (ref 65–99)
Glucose-Capillary: 79 mg/dL (ref 65–99)
Glucose-Capillary: 124 mg/dL — ABNORMAL HIGH (ref 65–99)
Glucose-Capillary: 89 mg/dL (ref 65–99)
Glucose-Capillary: 107 mg/dL — ABNORMAL HIGH (ref 65–99)
Glucose-Capillary: 117 mg/dL — ABNORMAL HIGH (ref 65–99)
Glucose-Capillary: 134 mg/dL — ABNORMAL HIGH (ref 65–99)
Glucose-Capillary: 113 mg/dL — ABNORMAL HIGH (ref 65–99)

## 2014-10-21 LAB — CBC
HCT: 33 % — ABNORMAL LOW (ref 36.0–46.0)
Hemoglobin: 10.8 g/dL — ABNORMAL LOW (ref 12.0–15.0)
MCH: 25.7 pg — ABNORMAL LOW (ref 26.0–34.0)
MCHC: 32.7 g/dL (ref 30.0–36.0)
MCV: 78.6 fL (ref 78.0–100.0)
Platelets: 235 10*3/uL (ref 150–400)
RBC: 4.2 MIL/uL (ref 3.87–5.11)
RDW: 16.7 % — ABNORMAL HIGH (ref 11.5–15.5)
WBC: 6.3 10*3/uL (ref 4.0–10.5)

## 2014-10-21 LAB — COMPREHENSIVE METABOLIC PANEL
ALT: 11 U/L — ABNORMAL LOW (ref 14–54)
AST: 16 U/L (ref 15–41)
Albumin: 2.8 g/dL — ABNORMAL LOW (ref 3.5–5.0)
Alkaline Phosphatase: 172 U/L — ABNORMAL HIGH (ref 38–126)
Anion gap: 8 (ref 5–15)
BUN: 7 mg/dL (ref 6–20)
CO2: 18 mmol/L — ABNORMAL LOW (ref 22–32)
Calcium: 9.1 mg/dL (ref 8.9–10.3)
Chloride: 108 mmol/L (ref 101–111)
Creatinine, Ser: 0.77 mg/dL (ref 0.44–1.00)
GFR calc Af Amer: >60 mL/min (ref >60)
GFR calc non Af Amer: >60 mL/min (ref >60)
Glucose, Bld: 71 mg/dL (ref 65–99)
Potassium: 3.9 mmol/L (ref 3.5–5.1)
Sodium: 134 mmol/L — ABNORMAL LOW (ref 135–145)
Total Bilirubin: 0.5 mg/dL (ref 0.3–1.2)
Total Protein: 6.3 g/dL — ABNORMAL LOW (ref 6.5–8.1)

## 2014-10-21 MED ORDER — MAGNESIUM SULFATE BOLUS VIA INFUSION: 4.0000 g | Freq: Once | INTRAVENOUS | Status: DC

## 2014-10-21 MED ORDER — ONDANSETRON HCL 40 MG/20ML IJ SOLN
8.0000 mg | Freq: Three times a day (TID) | INTRAMUSCULAR | Status: DC
Start: 2014-10-21 — End: 2014-10-22
  Administered 2014-10-21 – 2014-10-22 (×4): 8 mg via INTRAVENOUS
  Filled 2014-10-21 (×6): qty 4

## 2014-10-21 MED ORDER — PENICILLIN G POTASSIUM 5000000 UNITS IJ SOLR
5.0000 10*6.[IU] | Freq: Once | INTRAVENOUS | Status: AC
  Administered 2014-10-21: 5 10*6.[IU] via INTRAVENOUS
  Filled 2014-10-21: qty 5

## 2014-10-21 MED ORDER — TERBUTALINE SULFATE 1 MG/ML IJ SOLN: 0.2500 mg | Freq: Once | INTRAMUSCULAR | Status: AC | PRN

## 2014-10-21 MED ORDER — LACTATED RINGERS IV SOLN
INTRAVENOUS | Status: DC
  Administered 2014-10-21 – 2014-10-22 (×3): via INTRAVENOUS

## 2014-10-21 MED ORDER — MAGNESIUM SULFATE 50 % IJ SOLN
2.0000 g/h | INTRAVENOUS | Status: DC
  Administered 2014-10-21 – 2014-10-22 (×3): 2 g/h via INTRAVENOUS
  Filled 2014-10-21 (×3): qty 80

## 2014-10-21 MED ORDER — MAGNESIUM SULFATE BOLUS VIA INFUSION
4.0000 g | Freq: Once | INTRAVENOUS | Status: AC
  Filled 2014-10-21: qty 500

## 2014-10-21 MED ORDER — BETAMETHASONE SOD PHOS & ACET 6 (3-3) MG/ML IJ SUSP: 12.0000 mg | Freq: Once | INTRAMUSCULAR | Status: DC

## 2014-10-21 MED ORDER — INSULIN REGULAR HUMAN 100 UNIT/ML IJ SOLN
INTRAMUSCULAR | Status: DC
  Administered 2014-10-21: 0.7 [IU]/h via INTRAVENOUS
  Administered 2014-10-21: 1.3 [IU]/h via INTRAVENOUS
  Administered 2014-10-21: 0.5 [IU]/h via INTRAVENOUS
  Administered 2014-10-21: 1.1 [IU]/h via INTRAVENOUS
  Administered 2014-10-22: 2.4 [IU]/h via INTRAVENOUS
  Administered 2014-10-22: 1.9 [IU]/h via INTRAVENOUS
  Administered 2014-10-22: 2.7 [IU]/h via INTRAVENOUS
  Administered 2014-10-22 (×2): 0.9 [IU]/h via INTRAVENOUS
  Administered 2014-10-22: 2.3 [IU]/h via INTRAVENOUS
  Administered 2014-10-22: 2.2 [IU]/h via INTRAVENOUS
  Administered 2014-10-22: 3.5 [IU]/h via INTRAVENOUS
  Administered 2014-10-22: 2.3 [IU]/h via INTRAVENOUS
  Administered 2014-10-22: 2.5 [IU]/h via INTRAVENOUS
  Administered 2014-10-22: 2.9 [IU]/h via INTRAVENOUS
  Administered 2014-10-22: 1.5 [IU]/h via INTRAVENOUS
  Filled 2014-10-21: qty 2.5

## 2014-10-21 MED ORDER — PENICILLIN G POTASSIUM 5000000 UNITS IJ SOLR
2.5000 10*6.[IU] | INTRAMUSCULAR | Status: DC
  Administered 2014-10-21 – 2014-10-22 (×6): 2.5 10*6.[IU] via INTRAVENOUS
  Filled 2014-10-21 (×10): qty 2.5

## 2014-10-21 MED ORDER — BETAMETHASONE SOD PHOS & ACET 6 (3-3) MG/ML IJ SUSP
12.0000 mg | INTRAMUSCULAR | Status: AC
  Administered 2014-10-21 – 2014-10-22 (×2): 12 mg via INTRAMUSCULAR
  Filled 2014-10-21 (×2): qty 2

## 2014-10-21 MED ORDER — HYDROMORPHONE HCL 1 MG/ML IJ SOLN
2.0000 mg | Freq: Once | INTRAMUSCULAR | Status: AC
  Administered 2014-10-21: 2 mg via INTRAVENOUS
  Filled 2014-10-21: qty 2

## 2014-10-21 MED ORDER — OXYCODONE-ACETAMINOPHEN 5-325 MG PO TABS
2.0000 | ORAL_TABLET | Freq: Once | ORAL | Status: AC | PRN
  Filled 2014-10-21: qty 2

## 2014-10-21 MED ORDER — FENTANYL CITRATE (PF) 100 MCG/2ML IJ SOLN
100.0000 ug | INTRAMUSCULAR | Status: DC | PRN
  Administered 2014-10-21 (×2): 100 ug via INTRAVENOUS
  Filled 2014-10-21 (×2): qty 2

## 2014-10-21 MED ORDER — INSULIN REGULAR BOLUS VIA INFUSION: 0.0000 [IU] | Freq: Three times a day (TID) | INTRAVENOUS | Status: DC | PRN

## 2014-10-21 MED ORDER — SODIUM CHLORIDE 0.9 % IV SOLN: INTRAVENOUS | Status: DC | PRN

## 2014-10-21 MED ORDER — MAGNESIUM SULFATE 50 % IJ SOLN: 2.0000 g/h | INTRAVENOUS | Status: DC

## 2014-10-21 NOTE — Progress Notes (Signed)
Pt reports large gush of fluid when sitting up to go to bathroom

## 2014-10-21 NOTE — Progress Notes (Addendum)
[name] [name] is a 29 y.o. P3A2505 at [redacted]w[redacted]d by admitted for Preeclampsia with severe features headache currently; IIDM-Gestational, GBS positive  Subjective: Comfortable with Dilaudid. Starting to make some cervical change 1/60/-2  Objective: BP 164/92 mmHg  Pulse 92  Temp(Src) 98.1 F (36.7 C) (Oral)  Resp 20  Ht 5\' 1"  (1.549 m)  Wt 108.41 kg (239 lb)  BMI 45.18 kg/m2  SpO2 100%  LMP 02/23/2014      FHT:  Cat 1 reassurring UC:   regular, every 2-3 minutes SVE:   Dilation: Closed Effacement (%): 50 Station: -3 Exam by:: Erline Hau RNC  Labs: Lab Results  Component Value Date   WBC 7.2 10/20/2014   HGB 10.5* 10/20/2014   HCT 32.4* 10/20/2014   MCV 79.0 10/20/2014   PLT 221 10/20/2014    Assessment / Plan: Severe preeclampsia Gestational IDDM Preterm  GBS Positive Previous C/S  GBS Prophylaxis Labor: Foley Bulb placed without difficulty Preeclampsia:  on magnesium sulfate, no signs or symptoms of toxicity and labs stable Fetal Wellbeing:  Category I Pain Control:  Dilaudid I/D:  GBS Positive Anticipated MOD:  Tolac  [place] Brigance Grissett 10/21/2014, 11:25 AM

## 2014-10-21 NOTE — Progress Notes (Signed)
Patient ID: [name] [name], female   DOB: 1986-02-12, 29 y.o.   MRN: [id number] FACULTY PRACTICE ANTEPARTUM(COMPREHENSIVE) NOTE  [name] [name] is a 29 y.o. [id number] at [redacted]w[redacted]d  With gestational hypertension who is admitted for rule out preeclampsia .    Fetal presentation is cephalic. Length of Stay:  1  Days  Date of admission:10/20/2014  Subjective: Patient reports some lower back pain, headache, nausea and emesis Patient reports the fetal movement as active. Patient reports uterine contraction  activity as irregular, every 5-10 minutes. Patient reports  vaginal bleeding as none. Patient describes fluid per vagina as None.  Vitals:  Blood pressure 164/92, pulse 92, temperature 98.1 F (36.7 C), temperature source Oral, resp. rate 20, height 5\' 1"  (1.549 m), weight 239 lb (108.41 kg), last menstrual period 02/23/2014, SpO2 100 %. Filed Vitals:   10/20/14 2017 10/20/14 2341 10/21/14 0257 10/21/14 0822  BP: 137/80 145/73 148/92 164/92  Pulse: 92 87 81 92  Temp: 98.8 F (37.1 C) 98.6 F (37 C)  98.1 F (36.7 C)  TempSrc: Oral Oral    Resp: 18 20 20 20   Height:      Weight:  239 lb (108.41 kg)    SpO2:       Physical Examination:  General appearance - alert, well appearing, and in no distress Fundal Height:  size equals dates Pelvic Exam:  Performed by RN closed, long Extremities: extremities normal, atraumatic, no cyanosis or edema with DTRs 2+ bilaterally Membranes:intact  Fetal Monitoring:  Baseline: 130 bpm, Variability: Good {> 6 bpm), Accelerations: Reactive, Decelerations: Absent and Toco: contractions q 5 minutes   reactive  Labs:  Results for orders placed or performed during the hospital encounter of 10/20/14 (from the past 24 hour(s))  Protein / creatinine ratio, urine   Collection Time: 10/20/14  1:50 PM  Result Value Ref Range   Creatinine, Urine 287.00 mg/dL   Total Protein, Urine 100 mg/dL   Protein Creatinine Ratio 0.35 (H) 0.00 - 0.15 mg/mg[Cre]  CBC   Collection Time: 10/20/14  2:04 PM  Result Value Ref Range   WBC 7.2 4.0 - 10.5 K/uL   RBC 4.10 3.87 - 5.11 MIL/uL   Hemoglobin 10.5 (L) 12.0 - 15.0 g/dL   HCT 69.6 (L) 29.5 - 28.4 %   MCV 79.0 78.0 - 100.0 fL   MCH 25.6 (L) 26.0 - 34.0 pg   MCHC 32.4 30.0 - 36.0 g/dL   RDW 13.2 (H) 44.0 - 10.2 %   Platelets 221 150 - 400 K/uL  Comprehensive metabolic panel   Collection Time: 10/20/14  2:04 PM  Result Value Ref Range   Sodium 137 135 - 145 mmol/L   Potassium 3.3 (L) 3.5 - 5.1 mmol/L   Chloride 108 101 - 111 mmol/L   CO2 20 (L) 22 - 32 mmol/L   Glucose, Bld 123 (H) 65 - 99 mg/dL   BUN 5 (L) 6 - 20 mg/dL   Creatinine, Ser 7.25 0.44 - 1.00 mg/dL   Calcium 8.8 (L) 8.9 - 10.3 mg/dL   Total Protein 6.6 6.5 - 8.1 g/dL   Albumin 2.7 (L) 3.5 - 5.0 g/dL   AST 14 (L) 15 - 41 U/L   ALT 10 (L) 14 - 54 U/L   Alkaline Phosphatase 162 (H) 38 - 126 U/L   Total Bilirubin 0.5 0.3 - 1.2 mg/dL   GFR calc non Af Amer >60 >60 mL/min   GFR calc Af Amer >60 >60 mL/min   Anion gap  9 5 - 15  Glucose, capillary   Collection Time: 10/20/14  5:05 PM  Result Value Ref Range   Glucose-Capillary 86 65 - 99 mg/dL  Type and screen   Collection Time: 10/20/14  5:44 PM  Result Value Ref Range   ABO/RH(D) B NEG    Antibody Screen NEG    Sample Expiration 10/23/2014   Glucose, capillary   Collection Time: 10/21/14  2:53 AM  Result Value Ref Range   Glucose-Capillary 94 65 - 99 mg/dL    Imaging Studies:      Medications:  Scheduled . betamethasone acetate-betamethasone sodium phosphate  12 mg Intramuscular Once  . [START ON 10/22/2014] betamethasone acetate-betamethasone sodium phosphate  12 mg Intramuscular Once  . docusate sodium  100 mg Oral Daily  . insulin aspart  20 Units Subcutaneous QAC supper  . insulin aspart  25 Units Subcutaneous QAC breakfast  . insulin NPH Human  45 Units Subcutaneous QAC breakfast   And  . insulin NPH Human  20 Units Subcutaneous QHS  . magnesium  4 g Intravenous  Once  . prenatal multivitamin  1 tablet Oral Q1200  . prenatal multivitamin  1 tablet Oral Q1200  . valACYclovir  500 mg Oral Daily   I have reviewed the patient's current medications.  ASSESSMENT: [id number] [redacted]w[redacted]d Estimated Date of Delivery: 11/14/14  Patient Active Problem List   Diagnosis Date Noted  . Rh negative state in antepartum period 05/03/2014    Priority: Medium  . Obesity complicating pregnancy 05/01/2014    Priority: Medium  . Previous cesarean section complicating pregnancy 05/01/2014    Priority: Medium  . High-risk pregnancy in first trimester 04/17/2014    Priority: Medium  . Diabetes mellitus affecting pregnancy 03/24/2014    Priority: Medium  . Type II diabetes mellitus, uncontrolled 09/20/2007    Priority: Medium  . Obesity 07/09/2006    Priority: Medium  . Severe preeclampsia 10/21/2014  . Preeclampsia complicating hypertension 10/20/2014  . Fetal cardiac anomaly affecting pregnancy, antepartum 10/06/2014  . Chronic hypertension complicating or reason for care during pregnancy   . Alopecia areata 09/29/2013  . 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 RHINITIS, ALLERGIC 07/09/2006  . PAPANICOLAOU SMEAR, ABNORMAL 07/09/2006    PLAN: Patient with severe range BP with symptoms. Will start IOL for severe preeclampsia Will give first dose of BMZ today Continue insulin for DM (fasting CBG 94) Patient with previous cesarean c-section with a desire for TOLAC   [name] [name] 10/21/2014,10:11 AM

## 2014-10-22 ENCOUNTER — Encounter (HOSPITAL_COMMUNITY)
Admission: AD | Disposition: A | Discharge status: Home / Self Care | Payer: Self-pay | Source: Ambulatory Visit | Attending: Family Medicine

## 2014-10-22 ENCOUNTER — Encounter (HOSPITAL_COMMUNITY): Payer: Self-pay | Admitting: *Deleted

## 2014-10-22 ENCOUNTER — Inpatient Hospital Stay (HOSPITAL_COMMUNITY): Payer: Medicaid Other | Admitting: Anesthesiology

## 2014-10-22 DIAGNOSIS — Z302 Encounter for sterilization: Secondary | ICD-10-CM

## 2014-10-22 DIAGNOSIS — Z3A36 36 weeks gestation of pregnancy: Secondary | ICD-10-CM

## 2014-10-22 DIAGNOSIS — O99824 Streptococcus B carrier state complicating childbirth: Secondary | ICD-10-CM

## 2014-10-22 DIAGNOSIS — O3421 Maternal care for scar from previous cesarean delivery: Secondary | ICD-10-CM

## 2014-10-22 DIAGNOSIS — O1493 Unspecified pre-eclampsia, third trimester: Secondary | ICD-10-CM

## 2014-10-22 DIAGNOSIS — Z794 Long term (current) use of insulin: Secondary | ICD-10-CM

## 2014-10-22 DIAGNOSIS — E119 Type 2 diabetes mellitus without complications: Secondary | ICD-10-CM

## 2014-10-22 DIAGNOSIS — O2412 Pre-existing diabetes mellitus, type 2, in childbirth: Secondary | ICD-10-CM

## 2014-10-22 LAB — GLUCOSE, CAPILLARY
GLUCOSE-CAPILLARY: 129 mg/dL — AB (ref 65–99)
GLUCOSE-CAPILLARY: 110 mg/dL — AB (ref 65–99)
GLUCOSE-CAPILLARY: 105 mg/dL — AB (ref 65–99)
GLUCOSE-CAPILLARY: 100 mg/dL — AB (ref 65–99)
GLUCOSE-CAPILLARY: 108 mg/dL — AB (ref 65–99)
GLUCOSE-CAPILLARY: 89 mg/dL (ref 65–99)
GLUCOSE-CAPILLARY: 125 mg/dL — AB (ref 65–99)
GLUCOSE-CAPILLARY: 130 mg/dL — AB (ref 65–99)
Glucose-Capillary: 106 mg/dL — ABNORMAL HIGH (ref 65–99)
Glucose-Capillary: 107 mg/dL — ABNORMAL HIGH (ref 65–99)
Glucose-Capillary: 107 mg/dL — ABNORMAL HIGH (ref 65–99)
Glucose-Capillary: 113 mg/dL — ABNORMAL HIGH (ref 65–99)
Glucose-Capillary: 133 mg/dL — ABNORMAL HIGH (ref 65–99)
Glucose-Capillary: 134 mg/dL — ABNORMAL HIGH (ref 65–99)
Glucose-Capillary: 82 mg/dL (ref 65–99)
Glucose-Capillary: 90 mg/dL (ref 65–99)
Glucose-Capillary: 92 mg/dL (ref 65–99)
Glucose-Capillary: 99 mg/dL (ref 65–99)

## 2014-10-22 LAB — CBC
HCT: 34.1 % — ABNORMAL LOW (ref 36.0–46.0)
HEMATOCRIT: 31.1 % — AB (ref 36.0–46.0)
Hemoglobin: 11.3 g/dL — ABNORMAL LOW (ref 12.0–15.0)
Hemoglobin: 10.2 g/dL — ABNORMAL LOW (ref 12.0–15.0)
MCH: 26.2 pg (ref 26.0–34.0)
MCH: 26 pg (ref 26.0–34.0)
MCHC: 33.1 g/dL (ref 30.0–36.0)
MCHC: 32.8 g/dL (ref 30.0–36.0)
MCV: 79.1 fL (ref 78.0–100.0)
MCV: 79.1 fL (ref 78.0–100.0)
PLATELETS: 259 10*3/uL (ref 150–400)
Platelets: 250 10*3/uL (ref 150–400)
RBC: 4.31 MIL/uL (ref 3.87–5.11)
RBC: 3.93 MIL/uL (ref 3.87–5.11)
RDW: 16.6 % — AB (ref 11.5–15.5)
RDW: 16.7 % — ABNORMAL HIGH (ref 11.5–15.5)
WBC: 17.3 10*3/uL — AB (ref 4.0–10.5)
WBC: 18.2 10*3/uL — ABNORMAL HIGH (ref 4.0–10.5)

## 2014-10-22 SURGERY — Surgical Case
Anesthesia: Epidural

## 2014-10-22 MED ORDER — ONDANSETRON HCL 4 MG/2ML IJ SOLN
INTRAMUSCULAR | Status: AC
  Filled 2014-10-22: qty 2

## 2014-10-22 MED ORDER — DIBUCAINE 1 % RE OINT: 1.0000 "application " | TOPICAL_OINTMENT | RECTAL | Status: DC | PRN

## 2014-10-22 MED ORDER — WITCH HAZEL-GLYCERIN EX PADS: 1.0000 "application " | MEDICATED_PAD | CUTANEOUS | Status: DC | PRN

## 2014-10-22 MED ORDER — MORPHINE SULFATE (PF) 0.5 MG/ML IJ SOLN
INTRAMUSCULAR | Status: DC | PRN
  Administered 2014-10-22: 4 mg via EPIDURAL

## 2014-10-22 MED ORDER — BUPIVACAINE HCL (PF) 0.25 % IJ SOLN
INTRAMUSCULAR | Status: DC | PRN
  Administered 2014-10-22: 30 mL

## 2014-10-22 MED ORDER — CEFAZOLIN SODIUM-DEXTROSE 2-3 GM-% IV SOLR
INTRAVENOUS | Status: AC
  Filled 2014-10-22: qty 50

## 2014-10-22 MED ORDER — OXYTOCIN 40 UNITS IN LACTATED RINGERS INFUSION - SIMPLE MED: 62.5000 mL/h | INTRAVENOUS | Status: AC

## 2014-10-22 MED ORDER — SENNOSIDES-DOCUSATE SODIUM 8.6-50 MG PO TABS
2.0000 | ORAL_TABLET | ORAL | Status: DC
  Administered 2014-10-23 – 2014-10-24 (×3): 2 via ORAL
  Filled 2014-10-22 (×3): qty 2

## 2014-10-22 MED ORDER — SCOPOLAMINE 1 MG/3DAYS TD PT72: 1.0000 | MEDICATED_PATCH | Freq: Once | TRANSDERMAL | Status: DC

## 2014-10-22 MED ORDER — DIPHENHYDRAMINE HCL 25 MG PO CAPS
25.0000 mg | ORAL_CAPSULE | Freq: Four times a day (QID) | ORAL | Status: DC | PRN
  Administered 2014-10-23: 25 mg via ORAL
  Filled 2014-10-22 (×2): qty 1

## 2014-10-22 MED ORDER — NALOXONE HCL 1 MG/ML IJ SOLN: 1.0000 ug/kg/h | INTRAVENOUS | Status: DC | PRN

## 2014-10-22 MED ORDER — SODIUM CHLORIDE 0.9 % IJ SOLN: 3.0000 mL | INTRAMUSCULAR | Status: DC | PRN

## 2014-10-22 MED ORDER — LIDOCAINE-EPINEPHRINE (PF) 2 %-1:200000 IJ SOLN
INTRAMUSCULAR | Status: DC | PRN
  Administered 2014-10-22: 3 mL via EPIDURAL
  Administered 2014-10-22: 5 mL via EPIDURAL

## 2014-10-22 MED ORDER — NALBUPHINE HCL 10 MG/ML IJ SOLN: 5.0000 mg | INTRAMUSCULAR | Status: DC | PRN

## 2014-10-22 MED ORDER — DIPHENHYDRAMINE HCL 25 MG PO CAPS
25.0000 mg | ORAL_CAPSULE | ORAL | Status: DC | PRN
  Administered 2014-10-23: 25 mg via ORAL

## 2014-10-22 MED ORDER — LACTATED RINGERS IV SOLN
INTRAVENOUS | Status: DC | PRN
  Administered 2014-10-22: 15:00:00 via INTRAVENOUS

## 2014-10-22 MED ORDER — SCOPOLAMINE 1 MG/3DAYS TD PT72
MEDICATED_PATCH | TRANSDERMAL | Status: DC | PRN
  Administered 2014-10-22: 1 via TRANSDERMAL

## 2014-10-22 MED ORDER — PRENATAL MULTIVITAMIN CH
1.0000 | ORAL_TABLET | Freq: Every day | ORAL | Status: DC
  Administered 2014-10-23: 1 via ORAL
  Filled 2014-10-22 (×2): qty 1

## 2014-10-22 MED ORDER — MEPERIDINE HCL 25 MG/ML IJ SOLN
INTRAMUSCULAR | Status: DC | PRN
  Administered 2014-10-22 (×2): 12.5 mg via INTRAVENOUS

## 2014-10-22 MED ORDER — DIPHENHYDRAMINE HCL 50 MG/ML IJ SOLN: 12.5000 mg | INTRAMUSCULAR | Status: DC | PRN

## 2014-10-22 MED ORDER — OXYTOCIN 10 UNIT/ML IJ SOLN
INTRAMUSCULAR | Status: AC
  Filled 2014-10-22: qty 1

## 2014-10-22 MED ORDER — PHENYLEPHRINE 40 MCG/ML (10ML) SYRINGE FOR IV PUSH (FOR BLOOD PRESSURE SUPPORT)
80.0000 ug | PREFILLED_SYRINGE | INTRAVENOUS | Status: DC | PRN
  Filled 2014-10-22: qty 20

## 2014-10-22 MED ORDER — NALBUPHINE HCL 10 MG/ML IJ SOLN: 5.0000 mg | Freq: Once | INTRAMUSCULAR | Status: AC | PRN

## 2014-10-22 MED ORDER — SIMETHICONE 80 MG PO CHEW: 80.0000 mg | CHEWABLE_TABLET | ORAL | Status: DC | PRN

## 2014-10-22 MED ORDER — ONDANSETRON HCL 4 MG/2ML IJ SOLN: 4.0000 mg | Freq: Three times a day (TID) | INTRAMUSCULAR | Status: DC | PRN

## 2014-10-22 MED ORDER — MENTHOL 3 MG MT LOZG: 1.0000 | LOZENGE | OROMUCOSAL | Status: DC | PRN

## 2014-10-22 MED ORDER — LANOLIN HYDROUS EX OINT: 1.0000 "application " | TOPICAL_OINTMENT | CUTANEOUS | Status: DC | PRN

## 2014-10-22 MED ORDER — NALOXONE HCL 0.4 MG/ML IJ SOLN: 0.4000 mg | INTRAMUSCULAR | Status: DC | PRN

## 2014-10-22 MED ORDER — FENTANYL 2.5 MCG/ML BUPIVACAINE 1/10 % EPIDURAL INFUSION (WH - ANES): 14.0000 mL/h | INTRAMUSCULAR | Status: DC | PRN

## 2014-10-22 MED ORDER — CITRIC ACID-SODIUM CITRATE 334-500 MG/5ML PO SOLN
ORAL | Status: AC
  Administered 2014-10-22: 15:00:00
  Filled 2014-10-22: qty 15

## 2014-10-22 MED ORDER — LACTATED RINGERS IV SOLN
INTRAVENOUS | Status: DC | PRN
  Administered 2014-10-22: 16:00:00 via INTRAVENOUS

## 2014-10-22 MED ORDER — OXYTOCIN 40 UNITS IN LACTATED RINGERS INFUSION - SIMPLE MED
1.0000 m[IU]/min | INTRAVENOUS | Status: DC
  Administered 2014-10-22: 2 m[IU]/min via INTRAVENOUS
  Administered 2014-10-22: 4 m[IU]/min via INTRAVENOUS
  Filled 2014-10-22: qty 1000

## 2014-10-22 MED ORDER — BUPIVACAINE-EPINEPHRINE (PF) 0.25% -1:200000 IJ SOLN
INTRAMUSCULAR | Status: AC
  Filled 2014-10-22: qty 30

## 2014-10-22 MED ORDER — MEPERIDINE HCL 25 MG/ML IJ SOLN: 6.2500 mg | INTRAMUSCULAR | Status: DC | PRN

## 2014-10-22 MED ORDER — METOCLOPRAMIDE HCL 5 MG/ML IJ SOLN
10.0000 mg | Freq: Four times a day (QID) | INTRAMUSCULAR | Status: DC
  Administered 2014-10-22 (×2): 10 mg via INTRAVENOUS
  Filled 2014-10-22 (×2): qty 2

## 2014-10-22 MED ORDER — FENTANYL 2.5 MCG/ML BUPIVACAINE 1/10 % EPIDURAL INFUSION (WH - ANES)
14.0000 mL/h | INTRAMUSCULAR | Status: DC | PRN
  Administered 2014-10-22: 12 mL/h via EPIDURAL
  Filled 2014-10-22: qty 125

## 2014-10-22 MED ORDER — IBUPROFEN 600 MG PO TABS
600.0000 mg | ORAL_TABLET | Freq: Four times a day (QID) | ORAL | Status: DC
  Administered 2014-10-22 – 2014-10-25 (×9): 600 mg via ORAL
  Filled 2014-10-22 (×11): qty 1

## 2014-10-22 MED ORDER — OXYTOCIN 10 UNIT/ML IJ SOLN
40.0000 [IU] | INTRAVENOUS | Status: DC | PRN
  Administered 2014-10-22: 40 [IU] via INTRAVENOUS

## 2014-10-22 MED ORDER — FENTANYL CITRATE (PF) 100 MCG/2ML IJ SOLN: 25.0000 ug | INTRAMUSCULAR | Status: DC | PRN

## 2014-10-22 MED ORDER — TERBUTALINE SULFATE 1 MG/ML IJ SOLN: 0.2500 mg | Freq: Once | INTRAMUSCULAR | Status: DC | PRN

## 2014-10-22 MED ORDER — ACETAMINOPHEN 325 MG PO TABS: 650.0000 mg | ORAL_TABLET | ORAL | Status: DC | PRN

## 2014-10-22 MED ORDER — EPHEDRINE 5 MG/ML INJ: 10.0000 mg | INTRAVENOUS | Status: DC | PRN

## 2014-10-22 MED ORDER — SIMETHICONE 80 MG PO CHEW
80.0000 mg | CHEWABLE_TABLET | Freq: Three times a day (TID) | ORAL | Status: DC
  Administered 2014-10-22 – 2014-10-24 (×4): 80 mg via ORAL
  Filled 2014-10-22 (×3): qty 1

## 2014-10-22 MED ORDER — MORPHINE SULFATE 0.5 MG/ML IJ SOLN
INTRAMUSCULAR | Status: AC
  Filled 2014-10-22: qty 10

## 2014-10-22 MED ORDER — OXYCODONE-ACETAMINOPHEN 5-325 MG PO TABS
2.0000 | ORAL_TABLET | ORAL | Status: DC | PRN
  Administered 2014-10-24: 2 via ORAL
  Filled 2014-10-22: qty 2

## 2014-10-22 MED ORDER — MEPERIDINE HCL 25 MG/ML IJ SOLN
INTRAMUSCULAR | Status: AC
  Filled 2014-10-22: qty 1

## 2014-10-22 MED ORDER — LIDOCAINE-EPINEPHRINE (PF) 2 %-1:200000 IJ SOLN
INTRAMUSCULAR | Status: AC
Start: 2014-10-22 — End: 2014-10-22
  Filled 2014-10-22: qty 20

## 2014-10-22 MED ORDER — TETANUS-DIPHTH-ACELL PERTUSSIS 5-2.5-18.5 LF-MCG/0.5 IM SUSP: 0.5000 mL | Freq: Once | INTRAMUSCULAR | Status: DC

## 2014-10-22 MED ORDER — KETOROLAC TROMETHAMINE 30 MG/ML IJ SOLN: 30.0000 mg | Freq: Four times a day (QID) | INTRAMUSCULAR | Status: AC | PRN

## 2014-10-22 MED ORDER — OXYCODONE-ACETAMINOPHEN 5-325 MG PO TABS: 1.0000 | ORAL_TABLET | ORAL | Status: DC | PRN

## 2014-10-22 MED ORDER — CEFAZOLIN SODIUM-DEXTROSE 2-3 GM-% IV SOLR
INTRAVENOUS | Status: DC | PRN
  Administered 2014-10-22: 2 g via INTRAVENOUS

## 2014-10-22 MED ORDER — DEXTROSE IN LACTATED RINGERS 5 % IV SOLN
INTRAVENOUS | Status: DC
  Administered 2014-10-22: 21:00:00 via INTRAVENOUS

## 2014-10-22 MED ORDER — SIMETHICONE 80 MG PO CHEW
80.0000 mg | CHEWABLE_TABLET | ORAL | Status: DC
  Administered 2014-10-23 (×2): 80 mg via ORAL
  Filled 2014-10-22 (×3): qty 1

## 2014-10-22 MED ORDER — LIDOCAINE HCL (PF) 1 % IJ SOLN
INTRAMUSCULAR | Status: DC | PRN
  Administered 2014-10-22: 3 mL
  Administered 2014-10-22: 5 mL

## 2014-10-22 MED ORDER — MEASLES, MUMPS & RUBELLA VAC ~~LOC~~ INJ: 0.5000 mL | INJECTION | Freq: Once | SUBCUTANEOUS | Status: DC

## 2014-10-22 MED ORDER — SCOPOLAMINE 1 MG/3DAYS TD PT72
MEDICATED_PATCH | TRANSDERMAL | Status: AC
  Filled 2014-10-22: qty 1

## 2014-10-22 MED ORDER — ROPIVACAINE HCL 2 MG/ML IJ SOLN: 14.0000 mL/h | INTRAMUSCULAR | Status: DC

## 2014-10-22 MED ORDER — ZOLPIDEM TARTRATE 5 MG PO TABS: 5.0000 mg | ORAL_TABLET | Freq: Every evening | ORAL | Status: DC | PRN

## 2014-10-22 SURGICAL SUPPLY — 35 items
APL SKNCLS STERI-STRIP NONHPOA (GAUZE/BANDAGES/DRESSINGS) ×1
BENZOIN TINCTURE PRP APPL 2/3 (GAUZE/BANDAGES/DRESSINGS) ×2 IMPLANT
CLAMP CORD UMBIL (MISCELLANEOUS) IMPLANT
CLIP FILSHIE TUBAL LIGA STRL (Clip) ×4 IMPLANT
CLOSURE WOUND 1/2 X4 (GAUZE/BANDAGES/DRESSINGS) ×1
CLOTH BEACON ORANGE TIMEOUT ST (SAFETY) ×3 IMPLANT
DRAPE SHEET LG 3/4 BI-LAMINATE (DRAPES) IMPLANT
DRSG OPSITE POSTOP 4X10 (GAUZE/BANDAGES/DRESSINGS) ×3 IMPLANT
DURAPREP 26ML APPLICATOR (WOUND CARE) ×3 IMPLANT
ELECT REM PT RETURN 9FT ADLT (ELECTROSURGICAL) ×3
ELECTRODE REM PT RTRN 9FT ADLT (ELECTROSURGICAL) ×1 IMPLANT
EXTRACTOR VACUUM M CUP 4 TUBE (SUCTIONS) IMPLANT
EXTRACTOR VACUUM M CUP 4' TUBE (SUCTIONS)
GLOVE BIOGEL PI IND STRL 7.0 (GLOVE) ×1 IMPLANT
GLOVE BIOGEL PI INDICATOR 7.0 (GLOVE) ×4
GLOVE ECLIPSE 7.0 STRL STRAW (GLOVE) ×8 IMPLANT
GOWN STRL REUS W/TWL LRG LVL3 (GOWN DISPOSABLE) ×6 IMPLANT
KIT ABG SYR 3ML LUER SLIP (SYRINGE) IMPLANT
NDL HYPO 25X5/8 SAFETYGLIDE (NEEDLE) IMPLANT
NEEDLE HYPO 22GX1.5 SAFETY (NEEDLE) ×3 IMPLANT
NEEDLE HYPO 25X5/8 SAFETYGLIDE (NEEDLE) IMPLANT
NS IRRIG 1000ML POUR BTL (IV SOLUTION) ×3 IMPLANT
PACK C SECTION WH (CUSTOM PROCEDURE TRAY) ×3 IMPLANT
PAD ABD 8X7 1/2 STERILE (GAUZE/BANDAGES/DRESSINGS) ×2 IMPLANT
PAD OB MATERNITY 4.3X12.25 (PERSONAL CARE ITEMS) ×3 IMPLANT
RTRCTR C-SECT PINK 25CM LRG (MISCELLANEOUS) ×3 IMPLANT
SPONGE GAUZE 4X4 12PLY STER LF (GAUZE/BANDAGES/DRESSINGS) ×2 IMPLANT
STAPLER VISISTAT 35W (STAPLE) IMPLANT
STRIP CLOSURE SKIN 1/2X4 (GAUZE/BANDAGES/DRESSINGS) ×1 IMPLANT
SUT VIC AB 0 CTX 36 (SUTURE) ×9
SUT VIC AB 0 CTX36XBRD ANBCTRL (SUTURE) ×3 IMPLANT
SUT VIC AB 4-0 KS 27 (SUTURE) ×2 IMPLANT
SYR 30ML LL (SYRINGE) ×3 IMPLANT
TOWEL OR 17X24 6PK STRL BLUE (TOWEL DISPOSABLE) ×3 IMPLANT
TRAY FOLEY CATH SILVER 14FR (SET/KITS/TRAYS/PACK) ×3 IMPLANT

## 2014-10-22 NOTE — Progress Notes (Signed)
Labor Progress Note  [name] [name] is a 29 y.o. [id number] at [redacted]w[redacted]d  admitted for induction of labor due to Diabetes and Pre-eclamptic toxemia of pregnancy.  S: patient resting comfortably. Continues to have mild headache.   O:  BP 147/66 mmHg  Pulse 93  Temp(Src) 98.3 F (36.8 C) (Oral)  Resp 20  Ht 5\' 1"  (1.549 m)  Wt 239 lb (108.41 kg)  BMI 45.18 kg/m2  SpO2 100%  LMP 02/23/2014 FHT:  FHR: 140 bpm, variability: minimal ,  accelerations:  Abscent,  decelerations:  Present Late UC:   regular, every 3-5 minutes SVE:   Dilation: 3.5 Effacement (%): Thick Station: -3 Exam by:: Erline Hau RNC Membranes intact DTRs present Ext: BLE edema appreciated Pitocin @ 8 mu/min  Labs: Lab Results  Component Value Date   WBC 17.3* 10/22/2014   HGB 11.3* 10/22/2014   HCT 34.1* 10/22/2014   MCV 79.1 10/22/2014   PLT 259 10/22/2014    Assessment / Plan: 29 y.o. [id number] [redacted]w[redacted]d in early labor Induction of labor due to preeclampsia and diabetes,  progressing well on pitocin  Labor: Progressing on Pitocin minimally, will continue to increase then AROM as indicated.  -Will try intrauterine resuscitative efforts for late decels  -Continue to monitor if not improving may need to consider rLTCS Preeclampsia: Continue Mag and monitor for toxicity; prn hypertensives Diabetes: Continue to monitor CBGs; continue insulin infusion Fetal Wellbeing:  Category II Pain Control:  Epidural Anticipated MOD:  NSVD; TOLAC  Expectant management. Discussed with [name] [name] CNM   [name] Ada, DO 10/22/2014, 12:49 PM PGY-1, [facility]

## 2014-10-22 NOTE — Progress Notes (Signed)
Labor Progress Note  [name] [name] is a 29 y.o. [id number] at [redacted]w[redacted]d  admitted for induction of labor due to Diabetes and Pre-eclamptic toxemia of pregnancy.  S: Went to introduce myself to patient as oncoming team member. Patient is doing well and more comfortable with epidural in place. She does endorses a headache that is 3/10. Denies scotomata or RUQ pain.   O:  BP 129/66 mmHg  Pulse 105  Temp(Src) 98.6 F (37 C) (Oral)  Resp 20  Ht 5\' 1"  (1.549 m)  Wt 239 lb (108.41 kg)  BMI 45.18 kg/m2  SpO2 100%  LMP 02/23/2014 FHT:  FHR: 125 bpm, variability: moderate,  accelerations:  Present,  decelerations:  Absent UC:   regular, every 6-8 minutes SVE:   Dilation: 4 Effacement (%): 50 Station: -3 Exam by:: Dr. Loreta Ave Membranes intact DTRs present Ext: BLE edema appreciated Pitocin @ 4 mu/min  Labs: Lab Results  Component Value Date   WBC 17.3* 10/22/2014   HGB 11.3* 10/22/2014   HCT 34.1* 10/22/2014   MCV 79.1 10/22/2014   PLT 259 10/22/2014    Assessment / Plan: 29 y.o. [id number] [redacted]w[redacted]d in early labor Induction of labor due to preeclampsia and diabetes,  progressing well on pitocin  Labor: Progressing on Pitocin, will continue to increase then AROM  Preeclampsia: Continue Mag and monitor for toxicity; prn hypertensives Diabetes: Continue to monitor CBGs; continue insulin infusion Fetal Wellbeing:  Category I Pain Control:  Epidural Anticipated MOD:  NSVD; TOLAC  Expectant management   Caryl Ada, DO 10/22/2014, 9:11 AM PGY-1, [facility]

## 2014-10-22 NOTE — Anesthesia Preprocedure Evaluation (Addendum)
Anesthesia Evaluation  Patient identified by MRN, date of birth, ID band Patient awake    Reviewed: Allergy & Precautions, H&P , NPO status , Patient's Chart, lab work & pertinent test results  History of Anesthesia Complications Negative for: history of anesthetic complications  Airway Mallampati: II  TM Distance: >3 FB Neck ROM: full    Dental  (+) Teeth Intact   Pulmonary neg pulmonary ROS, Pneumonia: no inhaler.,  breath sounds clear to auscultation        Cardiovascular hypertension, Pt. on medications Rhythm:regular Rate:Normal     Neuro/Psych negative neurological ROS  negative psych ROS   GI/Hepatic negative GI ROS, Neg liver ROS,   Endo/Other  diabetes, Type obesity  Renal/GU      Musculoskeletal   Abdominal   Peds  Hematology  (+) anemia ,   Anesthesia Other Findings  TOLAC MO HTN DM     Reproductive/Obstetrics (+) Pregnancy                            Anesthesia Physical  Anesthesia Plan  ASA: III  Anesthesia Plan: Epidural   Post-op Pain Management:    Induction:   Airway Management Planned: Natural Airway  Additional Equipment:   Intra-op Plan:   Post-operative Plan:   Informed Consent: I have reviewed the patients History and Physical, chart, labs and discussed the procedure including the risks, benefits and alternatives for the proposed anesthesia with the patient or authorized representative who has indicated his/her understanding and acceptance.   Dental Advisory Given  Plan Discussed with: CRNA and Surgeon  Anesthesia Plan Comments: (Labs checked- platelets confirmed with RN in room. Fetal heart tracing, per RN, reported to be stable enough for sitting procedure. Discussed epidural, and patient consents to the procedure:  included risk of possible headache,backache, failed block, allergic reaction, and nerve injury. This patient was asked if  she had any questions or concerns before the procedure started.)      Anesthesia Quick Evaluation

## 2014-10-22 NOTE — Anesthesia Procedure Notes (Signed)
Epidural Patient location during procedure: OB  Preanesthetic Checklist Completed: patient identified, site marked, surgical consent, pre-op evaluation, timeout performed, IV checked, risks and benefits discussed and monitors and equipment checked  Epidural Patient position: sitting Prep: site prepped and draped and DuraPrep Patient monitoring: continuous pulse ox and blood pressure Approach: midline Location: L3-L4 Injection technique: LOR air  Needle:  Needle type: Tuohy  Needle gauge: 17 G Needle length: 9 cm and 9 Needle insertion depth: 8 cm Catheter type: closed end flexible Catheter size: 19 Gauge Catheter at skin depth: 15 cm Test dose: negative  Assessment Events: blood not aspirated, injection not painful, no injection resistance, negative IV test and no paresthesia  Additional Notes Dosing of Epidural:  1st dose, through catheter ............................................Marland Kitchen Xylocaine 30 mg  2nd dose, through catheter, after waiting 3 minutes.......Marland KitchenXylocaine 50 mg   ( 1% Xylo charted as a single dose in Epic Meds for ease of charting; actual dosing was fractionated as above, for saftey's sake)  As each dose occurred, patient was free of IV sx; and patient exhibited no evidence of SA injection.  Patient is more comfortable after epidural dosed. Please see RN's note for documentation of vital signs,and FHR which are stable.  Patient reminded not to try to ambulate with numb legs, and that an RN must be present the 1st time she attempts to get up.

## 2014-10-22 NOTE — Op Note (Signed)
Preoperative Diagnosis:  IUP @ [redacted]w[redacted]d, Previous C-section, Fetal intolerance of labor, pre-eclampsia with severe features, Class B DM-insulin requiring  Postoperative Diagnosis:  Same  Procedure: Repeat low transverse cesarean section, Bilateral Tubal Ligation with Filshie clips  Surgeon: Tinnie Gens, MD  Assistant: Caryl Ada, MD  Anesthesia: spinal with Corky Sox, MD  Findings: Viable female infant, APGAR (1 MIN): 6  APGAR (5 MINS):  6 APGAR (10 MINS): 7, Normal tubes and ovaries  Estimated blood loss: 750 cc  Complications: None known  Specimens: Placenta to labor and delivery  Reason for procedure: Briefly, the patient is a 29 y.o. [id number] at [redacted]w[redacted]d with h/o previous C-section who desires permanent sterility. Patient was induced due to pre-eclampsia with severe features. FHR was initially reassuring and IOL was begun with foley balloon placement and pitocin.  Patient spontaneously ruptured at 6 cm and had several bradycardias followed by minimal variability and recurrent late decelerations despite pitocin being turned off.  Due to [facility] and desire for sterility, decision was made to proceed with abdominal delivery. Patient counseled, r.e. Risks benefits of BTL, including permanency of procedure, risk of failure(1:100), increased risk of ectopic.  Patient verbalized understanding and desires to proceed   Procedure: Patient is a to the OR where spinal analgesia was administered. She was then placed in a supine position with left lateral tilt. She received 2 g of Ancef and SCDs were in place. A Foley catheter was present in the bladder. She was prepped and draped in the usual sterile fashion. A timeout was performed. A knife was then used to make a Pfannenstiel incision. This incision was carried out to underlying fascia which was divided in the midline with the knife. The incision was extended laterally, sharply. The fascia was dissected off the underlying rectus superiorly and  inferiorly, sharply in the midline and bluntly laterally.  The rectus was divided in the midline.  The peritoneal cavity was entered bluntly. The rectus muscle was divided bilaterally.  Alexis retractor was placed inside the incision.  A knife was used to make a low transverse incision on the uterus. This incision was carried down to the amniotic cavity was entered. Fetus was in LOT position and was brought up out of the incision without difficulty. Delayed cord clamping done x 45 seconds. Cord was clamped x 2 and cut. Infant taken to waiting pediatrician.  Cord pH and blood was obtained. Placenta was delivered from the uterus.  Uterus was cleaned with dry lap pads. Uterine incision closed with 0 Vicryl suture in a running fashion. Attention was turned to the pt's left tube which was grasped with a Babcock clamp and followed to its fimbriated end.  A Filshie clip was placed across the tube 1.5 cm from the cornu.  Attention was turned to the pt's right tube which was grasped with a Babcock clamp and followed to its fimbriated end.  A Filshie clip was placed across the tube 1.5 cm from the cornu. A 1cc of 0.25% Marcaine was injected into the surrounding tubes bilaterally.  Alexis retractor was removed from the abdomen. Peritoneal closure was done with 0 Vicryl suture.  There was bleeding from the right rectus and electrocautery was used to obtain hemostasis.  Fascia is closed with 0 Vicryl suture in a running fashion. Subcutaneous tissue infused with 30cc 0.25% Marcaine. Skin closed using 4-0 Vicryl on a Keith needle.  Steri strips applied, followed by pressure dressing.  All instrument, needle and lap counts were correct x 2.  Patient  was awake and taken to PACU stable.  Infant remained with mom, stable.

## 2014-10-22 NOTE — Anesthesia Postprocedure Evaluation (Signed)
  Anesthesia Post-op Note  Patient: [name] [name]  Procedure(s) Performed: Procedure(s): CESAREAN SECTION (N/A)  Patient Location: PACU  Anesthesia Type:Epidural  Level of Consciousness: awake  Airway and Oxygen Therapy: Patient Spontanous Breathing  Post-op Pain: mild  Post-op Assessment: Post-op Vital signs reviewed, Patient's Cardiovascular Status Stable, Respiratory Function Stable, Patent Airway, No signs of Nausea or vomiting and Pain level controlled LLE Motor Response: Purposeful movement   RLE Motor Response: Purposeful movement        Post-op Vital Signs: Reviewed and stable  Last Vitals:  Filed Vitals:   10/22/14 2100  BP: 140/61  Pulse: 96  Temp:   Resp:     Complications: No apparent anesthesia complications

## 2014-10-22 NOTE — Progress Notes (Signed)
Labor Progress Note  S: called to room by RN to evaluate patient as pt requested cesarean section  O:  BP 129/66 mmHg  Pulse 105  Temp(Src) 98.6 F (37 C) (Oral)  Resp 20  Ht 5\' 1"  (1.549 m)  Wt 239 lb (108.41 kg)  BMI 45.18 kg/m2  SpO2 100%  LMP 02/23/2014 Cat I CVE: 4-5/60/-2, exam difficult 2/2 pain and very soft cervix FB out  A&P: 29 y.o. [id number] [redacted]w[redacted]d IOL 2/2 preEclamspia with severe features #labor: requested cesarean section, then reported she was agreeable to epidural as FB out on this exam.  She still wishes to proceed with TOL however may change her mind in the future. - will increase pitocin after epidural placed  Jud Fanguy ROCIO, MD 7:55 AM

## 2014-10-22 NOTE — Progress Notes (Signed)
Called to room by RN for two late decels down to the 60s from baseline of 140. These decels occurred after SROM. No cord present when patient was checked. She was 6cm/-3. Patient was put in Trendelenburg position. Oxygen on. Fetal scalp electrode and IUPC placed. Pitocin was discontinued. FHT recovered after interventions.

## 2014-10-22 NOTE — Progress Notes (Signed)
[name] [name] is a 29 y.o. [id number] at [redacted]w[redacted]d by ultrasound admitted for induction of labor due to pre-eclampsia with severe features.  Subjective: Feeling some better.  Objective: BP 137/59 mmHg  Pulse 89  Temp(Src) 98.3 F (36.8 C) (Oral)  Resp 20  Ht 5\' 1"  (1.549 m)  Wt 239 lb (108.41 kg)  BMI 45.18 kg/m2  SpO2 100%  LMP 02/23/2014 I/O last 3 completed shifts: In: 4743.9 [P.O.:1680; I.V.:2501.9; IV Piggyback:562] Out: 5790 [Urine:3450; Emesis/NG output:2340] Total I/O In: 360.4 [I.V.:260.4; IV Piggyback:100] Out: 0   FHT:  FHR: 150 bpm, variability: minimal ,  accelerations:  Abscent,  decelerations:  Present repetitive late decelerations UC:   regular, every 10 minutes SVE:   Dilation: 6 Effacement (%): 70 Station: -3 Exam by:: Erline Hau RNC  Labs: Lab Results  Component Value Date   WBC 17.3* 10/22/2014   HGB 11.3* 10/22/2014   HCT 34.1* 10/22/2014   MCV 79.1 10/22/2014   PLT 259 10/22/2014    Assessment / Plan: Protracted active phase  Labor: Pitocin off due to FHR tracing Preeclampsia:  on magnesium sulfate, intake and ouput balanced and labs stable Fetal Wellbeing:  Category III Pain Control:  Epidural Anticipated MOD:  RLTCS with BTL due fetal intolerance of labor. Risks include but are not limited to bleeding, infection, injury to surrounding structures, including bowel, bladder and ureters, blood clots, and death. Patient counseled, r.e. Risks benefits of BTL, including permanency of procedure, risk of failure(1:100), increased risk of ectopic.  Patient verbalized understanding and desires to proceed Likelihood of success is high.   PRATT,TANYA S 10/22/2014, 2:47 PM

## 2014-10-22 NOTE — Transfer of Care (Signed)
Immediate Anesthesia Transfer of Care Note  Patient: [name] [name]  Procedure(s) Performed: Procedure(s): CESAREAN SECTION (N/A)  Patient Location: PACU  Anesthesia Type:Epidural  Level of Consciousness: awake  Airway & Oxygen Therapy: Patient Spontanous Breathing  Post-op Assessment: Report given to RN and Post -op Vital signs reviewed and stable  Post vital signs: stable  Last Vitals:  Filed Vitals:   10/22/14 1459  BP: 138/86  Pulse: 191  Temp:   Resp:     Complications: No apparent anesthesia complications

## 2014-10-22 NOTE — Progress Notes (Signed)
[name] [name] is a 29 y.o. [id number] at [redacted]w[redacted]d by ultrasound admitted for induction of labor due to Gestational diabetes and pre-eclampsia with severe features.  Subjective: Having a lot of nausea and vomiting.  Some upper abdominal pain, resolved after retching.   Objective: BP 126/96 mmHg  Pulse 142  Temp(Src) 98.3 F (36.8 C) (Oral)  Resp 20  Ht 5\' 1"  (1.549 m)  Wt 239 lb (108.41 kg)  BMI 45.18 kg/m2  SpO2 100%  LMP 02/23/2014 I/O last 3 completed shifts: In: 4743.9 [P.O.:1680; I.V.:2501.9; IV Piggyback:562] Out: 5790 [Urine:3450; Emesis/NG output:2340] Total I/O In: 360.4 [I.V.:260.4; IV Piggyback:100] Out: 0   FHT:  FHR: 140 bpm, variability: minimal ,  accelerations:  Abscent,  decelerations:  Present variable and late type with dips to low of 60's and lasting a little more than 60s UC:   irregular, every 10 minutes SVE:   Dilation: 6 Effacement (%): 70 Station: -3 Exam by:: Erline Hau RNC  Labs: Lab Results  Component Value Date   WBC 17.3* 10/22/2014   HGB 11.3* 10/22/2014   HCT 34.1* 10/22/2014   MCV 79.1 10/22/2014   PLT 259 10/22/2014    Assessment / Plan: Induction of labor due to preeclampsia and severe features with DM,  progressing well on pitocin  Labor: Pitocin off due to FHR Preeclampsia:  on magnesium sulfate, intake and ouput balanced and labs stable Fetal Wellbeing:  Category III Pain Control:  Epidural Anticipated MOD:  if FHR does not improve would move to RCS  PRATT,TANYA S 10/22/2014, 2:05 PM

## 2014-10-23 ENCOUNTER — Encounter (HOSPITAL_COMMUNITY): Payer: Self-pay | Admitting: Family Medicine

## 2014-10-23 ENCOUNTER — Other Ambulatory Visit: Payer: Medicaid Other

## 2014-10-23 LAB — GLUCOSE, CAPILLARY
GLUCOSE-CAPILLARY: 253 mg/dL — AB (ref 65–99)
Glucose-Capillary: 243 mg/dL — ABNORMAL HIGH (ref 65–99)
Glucose-Capillary: 215 mg/dL — ABNORMAL HIGH (ref 65–99)
Glucose-Capillary: 243 mg/dL — ABNORMAL HIGH (ref 65–99)

## 2014-10-23 LAB — CBC
HEMATOCRIT: 33.1 % — AB (ref 36.0–46.0)
Hemoglobin: 11.2 g/dL — ABNORMAL LOW (ref 12.0–15.0)
MCH: 26.6 pg (ref 26.0–34.0)
MCHC: 33.8 g/dL (ref 30.0–36.0)
MCV: 78.6 fL (ref 78.0–100.0)
Platelets: 263 10*3/uL (ref 150–400)
RBC: 4.21 MIL/uL (ref 3.87–5.11)
RDW: 17.3 % — AB (ref 11.5–15.5)
WBC: 21 10*3/uL — ABNORMAL HIGH (ref 4.0–10.5)

## 2014-10-23 MED ORDER — LACTATED RINGERS IV SOLN
INTRAVENOUS | Status: DC
  Administered 2014-10-23: 09:00:00 via INTRAVENOUS

## 2014-10-23 MED ORDER — HYDROCHLOROTHIAZIDE 12.5 MG PO CAPS
12.5000 mg | ORAL_CAPSULE | Freq: Once | ORAL | Status: AC
  Administered 2014-10-23: 12.5 mg via ORAL
  Filled 2014-10-23: qty 1

## 2014-10-23 MED ORDER — HYDROCHLOROTHIAZIDE 12.5 MG PO CAPS
12.5000 mg | ORAL_CAPSULE | Freq: Every day | ORAL | Status: DC
  Administered 2014-10-23: 12.5 mg via ORAL
  Filled 2014-10-23 (×2): qty 1

## 2014-10-23 MED ORDER — HYDROCHLOROTHIAZIDE 25 MG PO TABS
25.0000 mg | ORAL_TABLET | Freq: Every day | ORAL | Status: DC
  Administered 2014-10-24 – 2014-10-25 (×2): 25 mg via ORAL
  Filled 2014-10-23 (×3): qty 1

## 2014-10-23 MED ORDER — INSULIN NPH (HUMAN) (ISOPHANE) 100 UNIT/ML ~~LOC~~ SUSP
10.0000 [IU] | Freq: Every day | SUBCUTANEOUS | Status: DC
  Administered 2014-10-23 – 2014-10-25 (×3): 10 [IU] via SUBCUTANEOUS
  Filled 2014-10-23 (×3): qty 10

## 2014-10-23 MED ORDER — INSULIN ASPART 100 UNIT/ML ~~LOC~~ SOLN
0.0000 [IU] | Freq: Three times a day (TID) | SUBCUTANEOUS | Status: DC
  Administered 2014-10-24: 1 [IU] via SUBCUTANEOUS
  Administered 2014-10-24: 2 [IU] via SUBCUTANEOUS
  Administered 2014-10-25: 1 [IU] via SUBCUTANEOUS
  Administered 2014-10-25: 2 [IU] via SUBCUTANEOUS

## 2014-10-23 NOTE — Progress Notes (Signed)
Inpatient Diabetes Program Recommendations  AACE/ADA: New Consensus Statement on Inpatient Glycemic Control (2013)  Target Ranges:  Prepandial:   less than 140 mg/dL      Peak postprandial:   less than 180 mg/dL (1-2 hours)      Critically ill patients:  140 - 180 mg/dL   S/P c/section with pre-eclampsia. Pre-existing dm prior to pregnancy-hx notes taking metformin previously. In addition to the NPH in the am, please consider the following.: Inpatient Diabetes Program Recommendations Correction (SSI): Please consider ordering the novolog sensitive correction scale tidwc and the HS scale-this is ordered from the Glycemic Control Order Set for Non-pregnant Adults Glad to assist with any glucose/diabetes concerns.  Thank you Lenor Coffin, RN, MSN, CDE  Diabetes Inpatient Program Office: [phone] Pager: [phone] 8:00 am to 5:00 pm

## 2014-10-23 NOTE — Progress Notes (Signed)
Patient's BP elevated.  RN notified Dr. Ane Payment for review of BP & VS.  Stated will order an oral blood pressure medication.

## 2014-10-23 NOTE — Addendum Note (Signed)
Addendum  created 10/23/14 0801 by Renford Dills, CRNA   Modules edited: Notes Section   Notes Section:  File: [id number]

## 2014-10-23 NOTE — Progress Notes (Signed)
Subjective: Postpartum Day 1: Cesarean Delivery Patient reports incisional pain, tolerating PO and + flatus.    Objective: Vital signs in last 24 hours: Temp:  [97.3 F (36.3 C)-98.3 F (36.8 C)] 98.1 F (36.7 C) (06/13 0452) Pulse Rate:  [86-191] 86 (06/13 0700) Resp:  [16-20] 18 (06/13 0000) BP: (115-165)/(44-107) 145/82 mmHg (06/13 0700) SpO2:  [97 %-100 %] 100 % (06/13 0700) Weight:  [238 lb (107.956 kg)-239 lb 9.6 oz (108.682 kg)] 239 lb 9.6 oz (108.682 kg) (06/13 0517)  Physical Exam:  General: alert, cooperative, appears stated age and no distress Lochia: appropriate Uterine Fundus: firm Incision: no significant drainage, dressing dry and intact DVT Evaluation: No evidence of DVT seen on physical exam. Negative Homan's sign. No cords or calf tenderness.   Recent Labs  10/22/14 0545 10/22/14 1721  HGB 11.3* 10.2*  HCT 34.1* 31.1*    Assessment/Plan: Status post Cesarean section. Doing well postoperatively.  Continue current care.  [name] [name] DARLENE 10/23/2014, 7:45 AM

## 2014-10-23 NOTE — Progress Notes (Signed)
Ambulated pt to bathroom, foley cath d/c, all parts accounted for. Patient tolerated procedure well.

## 2014-10-23 NOTE — Anesthesia Postprocedure Evaluation (Signed)
  Anesthesia Post-op Note  Patient: [name] [name]  Procedure(s) Performed: Procedure(s): CESAREAN SECTION (N/A)  Patient Location: A-ICU  Anesthesia Type:Epidural  Level of Consciousness: awake  Airway and Oxygen Therapy: Patient Spontanous Breathing  Post-op Pain: mild  Post-op Assessment: Patient's Cardiovascular Status Stable and Respiratory Function Stable LLE Motor Response: Purposeful movement   RLE Motor Response: Purposeful movement        Post-op Vital Signs: stable  Last Vitals:  Filed Vitals:   10/23/14 0750  BP:   Pulse:   Temp: 36.7 C  Resp:     Complications: No apparent anesthesia complications

## 2014-10-23 NOTE — Progress Notes (Signed)
Patient's CBG before breakfast was 243.  Patient started Carb Modified Diet last night.  IV Fluids Magnesium and D5LR.  RN called Resident and notified of CBG, diet and IV fluids.  Resident stated will place new orders.

## 2014-10-23 NOTE — Progress Notes (Signed)
UR chart review completed.  

## 2014-10-23 NOTE — Lactation Note (Signed)
This note was copied from the chart of [name] Newell Salmi. Lactation Consultation Note  Initial visit made.  Breastfeeding consultation services and Providing Breastmilk for Your Baby in NICU book given to mom.  She states her first baby would not latch on or accept her pumped milk.  Mom was pleased newborn latched before NICU admission.  Instructed to pump and do hand expression every 3 hours for 15 minutes.  Discussed milk coming to volume.  Mom currently does not have WIC due to "missed appointment".  Encouraged to call WIC to schedule an appointment so she will be eligible for a pump.  Mom denies questions or concerns at present.  Patient Name: [name] [name] Today's Date: 10/23/2014 Reason for consult: Initial assessment;NICU baby   Maternal Data    Feeding Feeding Type: Formula Nipple Type: Slow - flow Length of feed: 10 min  LATCH Score/Interventions                      Lactation Tools Discussed/Used WIC Program: No Pump Review: Setup, frequency, and cleaning;Milk Storage Initiated by:: RN Date initiated:: 10/22/14   Consult Status      Rock Nephew S 10/23/2014, 2:30 PM

## 2014-10-24 LAB — GLUCOSE, CAPILLARY
GLUCOSE-CAPILLARY: 136 mg/dL — AB (ref 65–99)
Glucose-Capillary: 167 mg/dL — ABNORMAL HIGH (ref 65–99)
Glucose-Capillary: 134 mg/dL — ABNORMAL HIGH (ref 65–99)
Glucose-Capillary: 136 mg/dL — ABNORMAL HIGH (ref 65–99)

## 2014-10-24 LAB — RPR: RPR Ser Ql: NONREACTIVE

## 2014-10-24 MED ORDER — PNEUMOCOCCAL VAC POLYVALENT 25 MCG/0.5ML IJ INJ: 0.5000 mL | INJECTION | INTRAMUSCULAR | Status: DC | PRN

## 2014-10-24 NOTE — Progress Notes (Signed)
CSW attempted to meet with [name] to offer support and complete assessment due to baby's admission to NICU, but she was not in her room at this time.  CSW will attempt again at a later time. 

## 2014-10-24 NOTE — Lactation Note (Signed)
This note was copied from the chart of [name] [name]. Lactation Consultation Note  Patient Name: [name] [name] GGEZM'O Date: 10/24/2014 Reason for consult: Follow-up assessment;NICU baby NICU baby 46 hours of life. Assisted mom to latch baby to right breast in cross-cradle position. Baby very fussy at breast. Mom has colostrum flowing. While attempting to latch, mom states that she has changed her mind and just wants to pump and bottle-feed EBM. Asked mom if LC could come and visit her in her room on Women's unit to help her with pumping and mom agreed. When LC entered room, mom pumping and small bottles becoming full. Assisted mom to change bottles. Discussed pumping 8 times a day and taking 5-6 hours to sleep at night. Mom states that she just wanted to try something different with this baby's feeding, but doesn't want to nurse at breast anymore. Mom aware of pumping rooms in NICU, and is aware of [facility] loaner program for DEBP. Enc mom to call for assistance as needed. Mom states that she has a [facility] appointment Friday, so sent WIC breastfeeding referral.  Maternal Data    Feeding Feeding Type: Formula Nipple Type: Regular Length of feed: 30 min  LATCH Score/Interventions Latch: Repeated attempts needed to sustain latch, nipple held in mouth throughout feeding, stimulation needed to elicit sucking reflex. Intervention(s): Adjust position;Assist with latch;Breast compression  Audible Swallowing: None Intervention(s): Skin to skin  Type of Nipple: Flat  Comfort (Breast/Nipple): Soft / non-tender     Hold (Positioning): Full assist, staff holds infant at breast  LATCH Score: 4  Lactation Tools Discussed/Used     Consult Status Consult Status: Follow-up Date: 10/25/14 Follow-up type: In-patient    Geralynn Ochs 10/24/2014, 2:11 PM

## 2014-10-24 NOTE — Progress Notes (Signed)
Inpatient Diabetes Program Recommendations  AACE/ADA: New Consensus Statement on Inpatient Glycemic Control (2013)  Target Ranges:  Prepandial:   less than 140 mg/dL      Peak postprandial:   less than 180 mg/dL (1-2 hours)      Critically ill patients:  140 - 180 mg/dL   Results for [name] [name], [name] [name] (MRN [id number]) as of 10/24/2014 09:25  Ref. Range 10/23/2014 07:53 10/23/2014 11:29 10/23/2014 16:25 10/23/2014 21:12 10/24/2014 08:23  Glucose-Capillary Latest Ref Range: 65-99 mg/dL 053 (H) 976 (H) 734 (H) 243 (H) 167 (H)    Current orders for Inpatient glycemic control: Novolog 0-9 units TID with meals, NPH 10 units QAM  Inpatient Diabetes Program Recommendations Insulin - Basal: Please consider increasing NPH to 15 units QAM. Correction (SSI): Please consider adding Novolog bedtime correction scale.  Thanks, Orlando Penner, RN, MSN, CCRN, CDE Diabetes Coordinator Inpatient Diabetes Program [phone] (Team Pager from 8am to 5pm) [phone] (AP office) [phone] [facility] office) [phone] [facility] office)

## 2014-10-24 NOTE — Progress Notes (Signed)
Subjective: Postpartum Day 2: Cesarean Delivery Patient reports incisional pain, tolerating PO and no problems voiding.  Blood sugar increased.  Light lochia.  Objective: Vital signs in last 24 hours: Temp:  [98 F (36.7 C)-99.1 F (37.3 C)] 98.1 F (36.7 C) (06/14 0537) Pulse Rate:  [70-111] 111 (06/14 0537) Resp:  [18-21] 18 (06/14 0537) BP: (122-166)/(63-101) 122/63 mmHg (06/14 0537) SpO2:  [100 %] 100 % (06/14 0537) Weight:  [241 lb 0.8 oz (109.34 kg)] 241 lb 0.8 oz (109.34 kg) (06/14 0546)  Physical Exam:  General: alert, cooperative and no distress Lochia: appropriate Uterine Fundus: firm Incision: no significant drainage DVT Evaluation: No evidence of DVT seen on physical exam. Negative Homan's sign. No cords or calf tenderness.   Recent Labs  10/22/14 1721 10/23/14 1135  HGB 10.2* 11.2*  HCT 31.1* 33.1*   CBG (last 3)   Recent Labs  10/23/14 1625 10/23/14 2112 10/24/14 0823  GLUCAP 215* 243* 167*     Assessment/Plan: Status post Cesarean section. Doing well postoperatively.  Continue current care. Continue NPH and sliding scaling insulin.  Eamon Tantillo JEHIEL 10/24/2014, 10:21 AM

## 2014-10-24 NOTE — Clinical Social Work Maternal (Signed)
CLINICAL SOCIAL WORK MATERNAL/CHILD NOTE  Patient Details  Name: [name] [name] MRN: [id number] Date of Birth: 11/01/1985  Date:  03/13/15  Clinical Social Worker Initiating Note:  Saraiah Bhat E. Brigitte Pulse, [place] Date/ Time Initiated:  10/24/14/1430     Child's Name:  [name] [name]    Legal Guardian:   (Parents: [name] [name] and [name] [name])   Need for Interpreter:  None   Date of Referral:        Reason for Referral:   (No referral-NICU admission)   Referral Source:      Address:   ([address], [place], [place] [address])  Phone number:  [phone]   Household Members:  Significant Other, Minor Children ([name] [name]/will turn 6 in September)   Natural Supports (not living in the home):  Extended Family, Immediate Family, Friends   Professional Supports:  ([name] states she had an OBCM with her first pregnancy, but not with this one.)   Employment: Disabled (FOB receives Disability.  [name] explained that he had a tumor removed from the back of his head at 29 years old.)   Type of Work:  ([name] reports that she works at [employer] )   Education:      Pensions consultant:  SSI/Disability, [employer]   Other Resources:      Cultural/Religious Considerations Which May Impact Care: None stated  Strengths:  Ability to meet basic needs , Compliance with medical plan , Home prepared for child , Understanding of illness, Pediatrician chosen  ([name] states she has supplies other than a car seat.  She states she had been given a car seat from a friend, but was told that it was expired and is unsure how she will obtain another.  CSW informed her of car seat program through [facility].  She states she will speak to FOB and let CSW or RN know if they will need to utilize this resource.  Pediatric follow up will be at [facility].)   Risk Factors/Current Problems:  Other (Comment) (Hx of PPD )   Cognitive State:  Alert , Insightful , Linear  Thinking    Mood/Affect:  Tearful , Interested , Calm , Relaxed    CSW Assessment: CSW met with [name] in her third floor room to introduce myself, offer support and complete assessment due to baby's admission to NICU at 36.5 weeks.  [name] was pleasant and welcoming of CSW's visit.  She reports feeling well and provided CSW will an update on baby.  She appears to have a good understanding of baby's reason for admission and states she feels she is coping well.  She reports that she notified the nurses that she thought her baby looked a little blue while on her chest after the c-section and was glad when baby was admitted to NICU for further observation and intervention.  CSW asked if [name]'s first child had also been admitted to the NICU and she reports that he was not, but was admitted to [facility] straight from [facility] when [facility] discharged from [facility] after delivery.  She reports that he had a stroke and blood clot, but is "fine now."  She states he was in the hospital for approximately 2 weeks.   CSW inquired about [name]'s postpartum period after her first child and whether she experienced symptoms of anxiety, depression, etc.  [name] confirmed that she was depressed during the first few weeks after having her son.  When asked about her symptoms,  she replied, "I didn't want him."  She made the distinction that she wanted the pregnancy, but then felt like she was not bonded with him while he was in the hospital.  She reports feeling fine once he got home.  She states no hx of anxiety or depression prior to this time.  CSW assisted [name] in processing her feelings related to her newborn's admission to NICU and asked that she monitor her emotions in the coming weeks.  CSW reviewed common emotions in the first two weeks after delivery as well as signs and symptoms of PPD.  CSW asked that she contact CSW and or her doctor if she has concerns about her emotions at any time.  [name] agreed.  At this  time family and friends came to visit and CSW left [name] to be with her natural supports.  CSW informed [name] on how to reach CSW if needed, explained ongoing support services offered by NICU CSW and asked that she contact CSW any time she feels she would like to process her feelings.  [name] thanked CSW for the visit.  CSW Plan/Description:  Psychosocial Support and Ongoing Assessment of Needs, Patient/Family Education , Information/Referral to [employer]  (CSW informed [name] of car seat program through [employer].)    [name], [place] July 14, 2014, 3:44 PM

## 2014-10-25 LAB — GLUCOSE, CAPILLARY
Glucose-Capillary: 151 mg/dL — ABNORMAL HIGH (ref 65–99)
Glucose-Capillary: 124 mg/dL — ABNORMAL HIGH (ref 65–99)
Glucose-Capillary: 159 mg/dL — ABNORMAL HIGH (ref 65–99)

## 2014-10-25 MED ORDER — AMLODIPINE BESYLATE 10 MG PO TABS: 10.0000 mg | ORAL_TABLET | Freq: Every day | ORAL | Status: DC

## 2014-10-25 MED ORDER — PNEUMOCOCCAL VAC POLYVALENT 25 MCG/0.5ML IJ INJ
0.5000 mL | INJECTION | Freq: Once | INTRAMUSCULAR | Status: DC
  Filled 2014-10-25 (×2): qty 0.5

## 2014-10-25 MED ORDER — SITAGLIPTIN PHOSPHATE 100 MG PO TABS
100.0000 mg | ORAL_TABLET | Freq: Every day | ORAL | Status: DC
Start: 2014-10-25 — End: 2014-11-22

## 2014-10-25 MED ORDER — INSULIN NPH (HUMAN) (ISOPHANE) 100 UNIT/ML ~~LOC~~ SUSP: 10.0000 [IU] | Freq: Two times a day (BID) | SUBCUTANEOUS | Status: DC

## 2014-10-25 MED ORDER — OXYCODONE-ACETAMINOPHEN 5-325 MG PO TABS: 1.0000 | ORAL_TABLET | ORAL | Status: DC | PRN

## 2014-10-25 MED ORDER — HYDROCHLOROTHIAZIDE 25 MG PO TABS: 25.0000 mg | ORAL_TABLET | Freq: Every day | ORAL | Status: DC

## 2014-10-25 MED ORDER — AMLODIPINE BESYLATE 10 MG PO TABS
10.0000 mg | ORAL_TABLET | Freq: Every day | ORAL | Status: DC
  Administered 2014-10-25: 10 mg via ORAL
  Filled 2014-10-25 (×2): qty 1

## 2014-10-25 NOTE — Discharge Summary (Signed)
Obstetric Discharge Summary Reason for Admission: observation/evaluation Prenatal Procedures: NST, Preeclampsia and ultrasound Intrapartum Procedures: cesarean: low cervical, transverse Postpartum Procedures: magnesium Complications-Operative and Postpartum: none  OP note  Reason for procedure: Briefly, the patient is a 29 y.o. [id number] at 45w5dwith h/o previous C-section who desires permanent sterility. Patient was induced due to pre-eclampsia with severe features. FHR was initially reassuring and IOL was begun with foley balloon placement and pitocin. Patient spontaneously ruptured at 6 cm and had several bradycardias followed by minimal variability and recurrent late decelerations despite pitocin being turned off. [facility] decision was made to proceed with abdominal delivery. Patient counseled, r.e. Risks benefits of BTL, including permanency of procedure, risk of failure(1:100), increased risk of ectopic. Patient verbalized understanding and desires to proceed   Procedure: Patient is a to the OR where spinal analgesia was administered. She was then placed in a supine position with left lateral tilt. She received 2 g of Ancef and SCDs were in place. A Foley catheter was present in the bladder. She was prepped and draped in the usual sterile fashion. A timeout was performed. A knife was then used to make a Pfannenstiel incision. This incision was carried out to underlying fascia which was divided in the midline with the knife. The incision was extended laterally, sharply. The fascia was dissected off the underlying rectus superiorly and inferiorly, sharply in the midline and bluntly laterally. The rectus was divided in the midline. The peritoneal cavity was entered bluntly. The rectus muscle was divided bilaterally. Alexis retractor was placed inside the incision. A knife was used to make a low transverse incision on the uterus. This incision was carried down to  the amniotic cavity was entered. Fetus was in LOT position and was brought up out of the incision without difficulty. Delayed cord clamping done x 45 seconds. Cord was clamped x 2 and cut. Infant taken to waiting pediatrician. Cord pH and blood was obtained. Placenta was delivered from the uterus. Uterus was cleaned with dry lap pads. Uterine incision closed with 0 Vicryl suture in a running fashion. Attention was turned to the pt's left tube which was grasped with a Babcock clamp and followed to its fimbriated end. A Filshie clip was placed across the tube 1.5 cm from the cornu. Attention was turned to the pt's right tube which was grasped with a Babcock clamp and followed to its fimbriated end. A Filshie clip was placed across the tube 1.5 cm from the cornu. A 1cc of 0.25% Marcaine was injected into the surrounding tubes bilaterally. Alexis retractor was removed from the abdomen. Peritoneal closure was done with 0 Vicryl suture. There was bleeding from the right rectus and electrocautery was used to obtain hemostasis. Fascia is closed with 0 Vicryl suture in a running fashion. Subcutaneous tissue infused with 30cc 0.25% Marcaine. Skin closed using 4-0 Vicryl on a Keith needle. Steri strips applied, followed by pressure dressing. All instrument, needle and lap counts were correct x 2. Patient was awake and taken to PACU stable. Infant remained with mom, stable.    Hospital Course:  Active Problems:   Preeclampsia complicating hypertension   Severe preeclampsia   Postpartum care following cesarean delivery   Mallisa D CRocchiois a 29y.o. [id number]/p rLTCS with BTL.  Patient was admitted for evaluation of elevated blood pressure but given preE with severe features she was transferred to L&D for TOLAC.  Fetus was intolerant of labor and patient subsequently declined to  continue TOLAC and there for repeat cesarean section performed.  She has postpartum course that was uncomplicated including no  problems with ambulating, PO intake, urination, pain, or bleeding. The pt feels ready to go home and  will be discharged with outpatient follow-up.  Patient has had hyperglycemia.  Today: No acute events overnight.  Pt denies problems with ambulating, voiding or po intake.  She denies nausea or vomiting.  Pain is well controlled.  She has had flatus. She has had bowel movement.  Lochia Minimal.  Plan for birth control is  BTL.  Method of Feeding: bottle  Hypertension: on HCTZ 39m, Norvasc 159madded BDM: previously PO medications withheld and NPH 10u qAM  2/2 pt's request to breast feed.  However she is no longer breast feeding.  Pre-pregnancy regiment was 10038manTongad glyburide.  Expect honeymoon period where diabetes likely to be better control postpartum, therefore will only discharge with Januvia.  Pt to follow up with PCP at which point glyburide may be started if needed.   Physical Exam:  General: alert and cooperative Lochia: appropriate Uterine Fundus: firm Incision: healing well DVT Evaluation: No evidence of DVT seen on physical exam.  H/H: Lab Results  Component Value Date/Time   HGB 11.2* 10/23/2014 11:35 AM   HCT 33.1* 10/23/2014 11:35 AM    Discharge Diagnoses: Preelampsia and preterm pregnancy delivered  Discharge Information: Date: 10/25/2014 Activity: pelvic rest Diet: routine  Medications: Percocet and Januvia, Norvasc 33m20mCTZ 25mg32mast feeding:  No: declined Condition: stable Instructions: refer to handout Discharge to: home       Discharge Instructions    Baby Love Nurse Visit    Complete by:  As directed   On 6/16 for blood pressure check please  On 6/16 for blood pressure check please     Call MD for:  redness, tenderness, or signs of infection (pain, swelling, redness, odor or green/yellow discharge around incision site)    Complete by:  As directed      Call MD for:  severe uncontrolled pain    Complete by:  As directed      Call MD  for:  temperature >100.4    Complete by:  As directed      Diet - low sodium heart healthy    Complete by:  As directed             Medication List    STOP taking these medications        insulin NPH Human 100 UNIT/ML injection  Commonly known as:  HUMULIN N,NOVOLIN N     insulin regular 100 units/mL injection  Commonly known as:  NOVOLIN R,HUMULIN R     INSULIN SYRINGE 1CC/31GX5/16" 31G X 5/16" 1 ML Misc      TAKE these medications        ACCU-CHEK FASTCLIX LANCETS Misc  Check sugar up to 6 x daily     ACCU-CHEK NANO SMARTVIEW W/DEVICE Kit  Use up to 6 times daily to test blood sugars     acetaminophen 500 MG tablet  Commonly known as:  TYLENOL  Take 1,000 mg by mouth every 6 (six) hours as needed for mild pain.     amLODipine 10 MG tablet  Commonly known as:  NORVASC  Take 1 tablet (10 mg total) by mouth daily.     hydrochlorothiazide 25 MG tablet  Commonly known as:  HYDRODIURIL  Take 1 tablet (25 mg total) by mouth daily.     oxyCODONE-acetaminophen  5-325 MG per tablet  Commonly known as:  PERCOCET/ROXICET  Take 1 tablet by mouth every 4 (four) hours as needed (for pain scale 4-7).     prenatal multivitamin Tabs tablet  Take 1 tablet by mouth daily at 12 noon.     sitaGLIPtin 100 MG tablet  Commonly known as:  JANUVIA  Take 1 tablet (100 mg total) by mouth daily.       Follow-up Information    Follow up with [facility] In 1 week.   Specialty:  Obstetrics and Gynecology   Contact information:   [place] [phone]      Follow up with [facility].   Specialty:  Obstetrics and Gynecology   Why:  for blood pressure check   Contact information:   [place] [phone]      Follow up with Thersa Salt, DO In 2 weeks.   Specialty:  Family Medicine   Why:  to follow up your diabetes   Contact information:   [place] [address] [phone]       Follow up with [facility] nurse In 1 day.   Why:  for blood pressure check      Merla Riches ,MD OB Fellow 10/25/2014,2:12 PM

## 2014-10-25 NOTE — Discharge Instructions (Signed)

## 2014-10-25 NOTE — Progress Notes (Signed)

## 2014-10-25 NOTE — Progress Notes (Signed)
B/p 181/91, 172/95 (L) arm, 162/82 (R) arm.  Patient sitting on side of bed, denies any discomfort or pain.  Illene Bolus, CNW notified at [phone].  Will recheck b/p at 0400.

## 2014-10-25 NOTE — Plan of Care (Signed)
Problem: Anxiety Goal: Ability to express needs and understand communication Explain all procedures, hormonal changes post partum Allow time to voice concerns and actively listen to patient Allow time to rest Outcome: Progressing Tearful at times.  Stated she just wants to go "home". She wants to see her other child.

## 2014-10-26 ENCOUNTER — Other Ambulatory Visit: Payer: Medicaid Other

## 2014-10-30 ENCOUNTER — Other Ambulatory Visit: Payer: Medicaid Other

## 2014-11-02 ENCOUNTER — Ambulatory Visit (HOSPITAL_COMMUNITY): Payer: Medicaid Other

## 2014-11-03 ENCOUNTER — Encounter (HOSPITAL_COMMUNITY): Payer: Self-pay

## 2014-11-03 ENCOUNTER — Inpatient Hospital Stay (HOSPITAL_COMMUNITY)
Admission: AD | Admit: 2014-11-03 | Discharge: 2014-11-04 | Disposition: A | Discharge status: Home / Self Care | Payer: Medicaid Other | Source: Ambulatory Visit | Attending: Obstetrics & Gynecology | Admitting: Obstetrics & Gynecology

## 2014-11-03 DIAGNOSIS — R208 Other disturbances of skin sensation: Secondary | ICD-10-CM | POA: Diagnosis not present

## 2014-11-03 DIAGNOSIS — L7682 Other postprocedural complications of skin and subcutaneous tissue: Secondary | ICD-10-CM

## 2014-11-03 DIAGNOSIS — L299 Pruritus, unspecified: Secondary | ICD-10-CM

## 2014-11-03 DIAGNOSIS — O9089 Other complications of the puerperium, not elsewhere classified: Secondary | ICD-10-CM | POA: Diagnosis present

## 2014-11-03 MED ORDER — HYDROXYZINE HCL 50 MG PO TABS
50.0000 mg | ORAL_TABLET | Freq: Once | ORAL | Status: AC
  Administered 2014-11-03: 50 mg via ORAL
  Filled 2014-11-03: qty 1

## 2014-11-03 NOTE — MAU Provider Note (Signed)
Chief Complaint: Incisional Pain  First Provider Initiated Contact with Patient 11/03/14 2302      SUBJECTIVE HPI: [name] [name] is a 29 y.o. [id number] at 12 days status post repeat low transverse C-section for nonreassuring fetal heart tones and chronic hypertension with superimposed pre--eclampsia who presents to [facility] reporting burning pf her C-section incision after shower tonight. Denies fever, chills, drainage. Also reports itching of bilat arms, legs, neck chest upper back w/ sparse rash x 4 days. Minimal relief w/ Benadryl. Stopped percocet before pruritis and rash started. No new meds, soaps, detergents. No Hx similar Sx. No wheezing , swelling of lips or tongue or SOB.   Past Medical History  Diagnosis Date  . Hypertension   . Herpes simplex without mention of complication   . Obesity   . Diabetes mellitus     type II  . Irregular menses   . Abnormal Pap smear   . GBS (group B streptococcus) UTI complicating pregnancy    OB History  Gravida Para Term Preterm AB SAB TAB Ectopic Multiple Living  4 2 1 1 2 2    0 2    # Outcome Date GA Lbr Len/2nd Weight Sex Delivery Anes PTL Lv  4 Preterm 10/22/14 [redacted]w[redacted]d 7 lb 3.3 oz (3.27 kg) F CS-LTranv None  Y  3 Term 01/30/11 385w6d M CS-LTranv EPI  Y  2 SAB 09/11/01 [id number]    N  1 SAB              Past Surgical History  Procedure Laterality Date  . Cesarean section  01/30/2011    Procedure: CESAREAN SECTION;  Surgeon: JohJonnie KindD;  [facility];  Service: Gynecology;  Laterality: N/A;  . Wisdom tooth extraction    . Cesarean section N/A 10/22/2014    Procedure: CESAREAN SECTION;  Surgeon: TanDonnamae JudeD;  [facility];  Service: Obstetrics;  Laterality: N/A;   History   Social History  . Marital Status: Single    Spouse Name: N/A  . Number of Children: N/A  . Years of Education: N/A   Occupational History  . Not on file.   Social History Main Topics  . Smoking status: Never Smoker   .  Smokeless tobacco: Never Used  . Alcohol Use: No  . Drug Use: No  . Sexual Activity: Yes   Other Topics Concern  . Not on file   Social History Narrative   No current facility-administered medications on file prior to encounter.   Current Outpatient Prescriptions on File Prior to Encounter  Medication Sig Dispense Refill  . ACCU-CHEK FASTCLIX LANCETS MISC Check sugar up to 6 x daily 204 each 3  . acetaminophen (TYLENOL) 500 MG tablet Take 1,000 mg by mouth every 6 (six) hours as needed for mild pain.    . aMarland KitchenLODipine (NORVASC) 10 MG tablet Take 1 tablet (10 mg total) by mouth daily. 30 tablet 2  . Blood Glucose Monitoring Suppl (ACCU-CHEK NANO SMARTVIEW) W/DEVICE KIT Use up to 6 times daily to test blood sugars 1 kit 0  . hydrochlorothiazide (HYDRODIURIL) 25 MG tablet Take 1 tablet (25 mg total) by mouth daily. 30 tablet 1  . oxyCODONE-acetaminophen (PERCOCET/ROXICET) 5-325 MG per tablet Take 1 tablet by mouth every 4 (four) hours as needed (for pain scale 4-7). 30 tablet 0  . Prenatal Vit-Fe Fumarate-FA (PRENATAL MULTIVITAMIN) TABS tablet Take 1 tablet by mouth daily at 12 noon.    .Marland Kitchen  sitaGLIPtin (JANUVIA) 100 MG tablet Take 1 tablet (100 mg total) by mouth daily. 30 tablet 1   No Known Allergies  Review of Systems  Constitutional: Negative for fever and chills.  Gastrointestinal: Negative for abdominal pain.  Musculoskeletal: Negative for myalgias.  Skin: Positive for itching and rash.       Incisional burning.      OBJECTIVE Blood pressure 121/66, pulse 92, temperature 98.9 F (37.2 C), temperature source Oral, resp. rate 16, unknown if currently breastfeeding. GENERAL: Well-developed, well-nourished, obese female in no acute distress.  HEART: normal rate RESP: normal effort GI: Abdomen soft, non-tender. Positive bowel sounds 4. SKIN: Incision heeling-well,well-approximated. No bleeding or drainage.  MS: Nontender, no edema NEURO: Alert and oriented SPECULUM EXAM:  Deferred  LAB RESULTS No results found for this or any previous visit (from the past 24 hour(s)).  IMAGING N/A  MAU COURSE Normally-healing incision. Generalized rash w/out evidence of emergent allergic reaction. Vistaril given in MAU.   ASSESSMENT 1. Pain at surgical incision   2. Generalized pruritus    PLAN Discharge home in stable condition. Encouraged pt to consider triggers of allergic dermatitis. Allergy red-flags reviewed.      Follow-up Information    Follow up with [facility] In 4 weeks.   Specialty:  Obstetrics and Gynecology   Why:  For routine postpartum visit or sooner as needed if symptoms worsen   Contact information:   [place] [phone]      Follow up with [facility].   Why:  As needed in emergencies   Contact information:   [address] [id number] [place] [phone]       Medication List    STOP taking these medications        hydrochlorothiazide 25 MG tablet  Commonly known as:  HYDRODIURIL     oxyCODONE-acetaminophen 5-325 MG per tablet  Commonly known as:  PERCOCET/ROXICET      TAKE these medications        ACCU-CHEK FASTCLIX LANCETS Misc  Check sugar up to 6 x daily     ACCU-CHEK NANO SMARTVIEW W/DEVICE Kit  Use up to 6 times daily to test blood sugars     acetaminophen 500 MG tablet  Commonly known as:  TYLENOL  Take 1,000 mg by mouth every 6 (six) hours as needed for mild pain.     amLODipine 10 MG tablet  Commonly known as:  NORVASC  Take 1 tablet (10 mg total) by mouth daily.     hydrOXYzine 25 MG capsule  Commonly known as:  VISTARIL  Take 1-2 capsules (25-50 mg total) by mouth every 6 (six) hours as needed for itching.     prenatal multivitamin Tabs tablet  Take 1 tablet by mouth daily at 12 noon.     sitaGLIPtin 100 MG tablet  Commonly known as:  JANUVIA  Take 1 tablet  (100 mg total) by mouth daily.         [place], [place] 11/04/2014  12:38 AM

## 2014-11-03 NOTE — MAU Note (Signed)
Patient reports having a bowel movement tonight and then got in the shower and felt her C/S incision burning, does not think it is oozing. C/S 10/22/14

## 2014-11-04 DIAGNOSIS — R208 Other disturbances of skin sensation: Secondary | ICD-10-CM | POA: Diagnosis not present

## 2014-11-04 MED ORDER — HYDROXYZINE PAMOATE 25 MG PO CAPS: 25.0000 mg | ORAL_CAPSULE | Freq: Four times a day (QID) | ORAL | Status: DC | PRN

## 2014-11-04 NOTE — Discharge Instructions (Signed)
Incision Care An incision is when a surgeon cuts into your body tissues. After surgery, the incision needs to be cared for properly to prevent infection.  HOME CARE INSTRUCTIONS   Take all medicine as directed by your caregiver. Only take over-the-counter or prescription medicines for pain, discomfort, or fever as directed by your caregiver.  Do not remove your bandage (dressing) or get your incision wet until your surgeon gives you permission. In the event that your dressing becomes wet, dirty, or starts to smell, change the dressing and call your surgeon for instructions as soon as possible.  Take showers. Do not take tub baths, swim, or do anything that may soak the wound until it is healed.  Resume your normal diet and activities as directed or allowed.  Avoid lifting any weight until you are instructed otherwise.  Use anti-itch antihistamine medicine as directed by your caregiver. The wound may itch when it is healing. Do not pick or scratch at the wound.  Follow up with your caregiver for stitch (suture) or staple removal as directed.  Drink enough fluids to keep your urine clear or pale yellow. SEEK MEDICAL CARE IF:   You have redness, swelling, or increasing pain in the wound that is not controlled with medicine.  You have drainage, blood, or pus coming from the wound that lasts longer than 1 day.  You develop muscle aches, chills, or a general ill feeling.  You notice a bad smell coming from the wound or dressing.  Your wound edges separate after the sutures, staples, or skin adhesive strips have been removed.  You develop persistent nausea or vomiting. SEEK IMMEDIATE MEDICAL CARE IF:   You have a fever.  You develop a rash.  You develop dizzy episodes or faint while standing.  You have difficulty breathing.  You develop any reaction or side effects to medicine given. MAKE SURE YOU:   Understand these instructions.  Will watch your condition.  Will get help  right away if you are not doing well or get worse. Document Released: 11/15/2004 Document Revised: 07/21/2011 Document Reviewed: 06/22/2013 [facility] Patient Information 2015 [place], [place]. This information is not intended to replace advice given to you by your health care provider. Make sure you discuss any questions you have with your health care provider.  Contact Dermatitis Contact dermatitis is a reaction to certain substances that touch the skin. Contact dermatitis can be either irritant contact dermatitis or allergic contact dermatitis. Irritant contact dermatitis does not require previous exposure to the substance for a reaction to occur.Allergic contact dermatitis only occurs if you have been exposed to the substance before. Upon a repeat exposure, your body reacts to the substance.  CAUSES  Many substances can cause contact dermatitis. Irritant dermatitis is most commonly caused by repeated exposure to mildly irritating substances, such as:  Makeup.  Soaps.  Detergents.  Bleaches.  Acids.  Metal salts, such as nickel. Allergic contact dermatitis is most commonly caused by exposure to:  Poisonous plants.  Chemicals (deodorants, shampoos).  Jewelry.  Latex.  Neomycin in triple antibiotic cream.  Preservatives in products, including clothing. SYMPTOMS  The area of skin that is exposed may develop:  Dryness or flaking.  Redness.  Cracks.  Itching.  Pain or a burning sensation.  Blisters. With allergic contact dermatitis, there may also be swelling in areas such as the eyelids, mouth, or genitals.  DIAGNOSIS  Your caregiver can usually tell what the problem is by doing a physical exam. In cases where the  cause is uncertain and an allergic contact dermatitis is suspected, a patch skin test may be performed to help determine the cause of your dermatitis. TREATMENT Treatment includes protecting the skin from further contact with the irritating substance by  avoiding that substance if possible. Barrier creams, powders, and gloves may be helpful. Your caregiver may also recommend:  Steroid creams or ointments applied 2 times daily. For best results, soak the rash area in cool water for 20 minutes. Then apply the medicine. Cover the area with a plastic wrap. You can store the steroid cream in the refrigerator for a "chilly" effect on your rash. That may decrease itching. Oral steroid medicines may be needed in more severe cases.  Antibiotics or antibacterial ointments if a skin infection is present.  Antihistamine lotion or an antihistamine taken by mouth to ease itching.  Lubricants to keep moisture in your skin.  Burow's solution to reduce redness and soreness or to dry a weeping rash. Mix one packet or tablet of solution in 2 cups cool water. Dip a clean washcloth in the mixture, wring it out a bit, and put it on the affected area. Leave the cloth in place for 30 minutes. Do this as often as possible throughout the day.  Taking several cornstarch or baking soda baths daily if the area is too large to cover with a washcloth. Harsh chemicals, such as alkalis or acids, can cause skin damage that is like a burn. You should flush your skin for 15 to 20 minutes with cold water after such an exposure. You should also seek immediate medical care after exposure. Bandages (dressings), antibiotics, and pain medicine may be needed for severely irritated skin.  HOME CARE INSTRUCTIONS  Avoid the substance that caused your reaction.  Keep the area of skin that is affected away from hot water, soap, sunlight, chemicals, acidic substances, or anything else that would irritate your skin.  Do not scratch the rash. Scratching may cause the rash to become infected.  You may take cool baths to help stop the itching.  Only take over-the-counter or prescription medicines as directed by your caregiver.  See your caregiver for follow-up care as directed to make sure  your skin is healing properly. SEEK MEDICAL CARE IF:   Your condition is not better after 3 days of treatment.  You seem to be getting worse.  You see signs of infection such as swelling, tenderness, redness, soreness, or warmth in the affected area.  You have any problems related to your medicines. Document Released: 04/25/2000 Document Revised: 07/21/2011 Document Reviewed: 10/01/2010 [facility] Patient Information 2015 [place], [place]. This information is not intended to replace advice given to you by your health care provider. Make sure you discuss any questions you have with your health care provider.

## 2014-11-05 ENCOUNTER — Other Ambulatory Visit: Payer: Self-pay | Admitting: Family Medicine

## 2014-11-07 ENCOUNTER — Encounter: Payer: Self-pay | Admitting: General Practice

## 2014-11-22 ENCOUNTER — Ambulatory Visit (INDEPENDENT_AMBULATORY_CARE_PROVIDER_SITE_OTHER): Payer: Medicaid Other | Admitting: Family Medicine

## 2014-11-22 VITALS — BP 134/69 | HR 83 | Temp 98.2°F | Ht 61.0 in | Wt 196.0 lb

## 2014-11-22 DIAGNOSIS — E1165 Type 2 diabetes mellitus with hyperglycemia: Secondary | ICD-10-CM | POA: Diagnosis present

## 2014-11-22 DIAGNOSIS — IMO0002 Reserved for concepts with insufficient information to code with codable children: Secondary | ICD-10-CM

## 2014-11-22 MED ORDER — GLIMEPIRIDE 4 MG PO TABS: 6.0000 mg | ORAL_TABLET | Freq: Every day | ORAL | Status: DC

## 2014-11-22 MED ORDER — SITAGLIPTIN PHOSPHATE 100 MG PO TABS: 100.0000 mg | ORAL_TABLET | Freq: Every day | ORAL | Status: DC

## 2014-11-22 NOTE — Patient Instructions (Signed)
Dear [name] [name], Thank you for coming in to clinic today. It was good to see you!  1. You are doing well after your C-section 2. For your Diabetes - sent refill Januvia take 1 tab daily as you were. Sent Glimiperide (Amaryl) 4mg  tabs - take 1.5 tablets daily before breakfast. 3. Call Medicaid to sort out your insurance situation, should get medicaid back after pregnancy and should get Januvia covered. Otherwise may schedule [facility] with Abundio MiuBarbara McGregor (call to schedule)  Bring Blood Sugar log to next apponitment.  Some important numbers from today's visit: HgA1C - Due next visit Results -  Please schedule a follow-up appointment with Dr. Beryle FlockBacigalupo in 2 to 4 weeks for Diabetes Follow-up, need A1c and blood work   If you have any other questions or concerns, please feel free to call the clinic to contact me. You may also schedule an earlier appointment if necessary.  However, if your symptoms get significantly worse, please go to the Emergency Department to seek immediate medical attention.  Saralyn PilarAlexander Rand Boller, DO [facility]

## 2014-11-22 NOTE — Assessment & Plan Note (Addendum)
Previously improving control. Prior uncontrolled (A1c >10). Now s/p pregnancy (was on [place]uvia and amaryl prior, switched to insulin during pregnancy) Last HgbA1c 9.8 (04/2014) No complications or hypoglycemia.  Plan:  1. Remain off insulin 2. Refilled Januvia 100mg  daily (unclear if will be covered by medicaid, ?pregnancy medicaid vs regular, advised pt to call and inquire on the status of this. Meantime finish current 10-12 days left of januvia) 3. Re-order Amaryl 6mg  (take 1.5 of 4mg  tabs) q breakfast - (on prior to pregnancy) 4. Lifestyle Mods - Continue improved DM diet (dec carbs, inc vegs), exercise 5. RTC 2-4 weeks to f/u with PCP for specific DM follow-up, check A1c, bring CBG logs, consider ACE now s/p BTL

## 2014-11-22 NOTE — Progress Notes (Signed)
   Subjective:    Patient ID: [name] [name], female    DOB: [birth year], 29 y.o.   MRN: [id number]  HPI  POSTPARTUM FOLLOW-UP [id number] s/p rLTCS (#2) on 6/12 for NRFHT, cHTN, superimposed PreE - Today presents weeks after C-section for follow-up. No concerns today. Incision site healing well without problems. Taking Ibuprofen 200mg  daily with relief. - Contraception: S/p BTL - Denies any fevers/chills, redness, rash, drainage from incision  DM2, chronic - Dx at age 29 yr. Known fam hx with Father and Grandmother DM2 - Previously treated with Metformin then switched to Januvia. Intolerance to Metformin. - During pregnancy took NPH insulin 45-N / 25-R TID - Currently taking Januvia 100mg  PO daily (only has 10-12 pills left, was told Medicaid will not cover anymore), previously on Glyburide 6mg  daily with breakfast - Checking CBG up to 4x daily - did not bring CBG log today. Avg CBG 90-110 Reports good compliance. Tolerating well w/o side-effects Currently not on ACE/ARB (s/p pregnancy was not on before) Lifestyle: Diet (DM diet) / Exercise (walking) Denies hypoglycemia, polyuria, visual changes, numbness or tingling.  I have reviewed and updated the following as appropriate: allergies and current medications  Social Hx: - Non smoker  Review of Systems  See above HPI    Objective:   Physical Exam  BP 134/69 mmHg  Pulse 83  Temp(Src) 98.2 F (36.8 C) (Oral)  Ht 5\' 1"  (1.549 m)  Wt 196 lb (88.905 kg)  BMI 37.05 kg/m2  Gen - obese, well-appearing, comfortable, NAD HEENT - MMM Heart - RRR, no murmurs heard Lungs - CTAB, no wheezing, crackles, or rhonchi. Non-labored Abd - soft, formerly gravid uterus, NTND, no masses, +active BS. Well healing low transverse C/S incision small < 1 inch area on R-aspect without complete approximation but no evidence of dehiscence, non-tender, no firmness or induration, no erythema or drainage.  Ext - non-tender, no edema, peripheral pulses  intact +2 b/l Skin - warm, dry, no rashes Neuro - awake, alert     Assessment & Plan:   See specific A&P problem list for details.

## 2014-11-23 ENCOUNTER — Encounter: Payer: Self-pay | Admitting: Family Medicine

## 2014-11-23 NOTE — Assessment & Plan Note (Signed)
Stable, rLTCS (#2) incision healing well at 4 wks. No sign of infection or dehiscence.  Plan: 1. Reassurance, no change to general incision care 2. Ibuprofen PRN 3. [facility]

## 2014-12-04 ENCOUNTER — Encounter: Payer: Self-pay | Admitting: Obstetrics & Gynecology

## 2014-12-04 ENCOUNTER — Ambulatory Visit (INDEPENDENT_AMBULATORY_CARE_PROVIDER_SITE_OTHER): Payer: Medicaid Other | Admitting: Obstetrics & Gynecology

## 2014-12-04 DIAGNOSIS — O2493 Unspecified diabetes mellitus in the puerperium: Secondary | ICD-10-CM

## 2014-12-04 LAB — HEMOGLOBIN A1C
HEMOGLOBIN A1C: 6.9 % — AB (ref <5.7)
Mean Plasma Glucose: 151 mg/dL — ABNORMAL HIGH (ref <117)

## 2014-12-04 NOTE — Progress Notes (Signed)
Patient ID: [name] [name], female   DOB: 07/16/1985, 29 y.o.   MRN: [id number] Subjective:     [name] [name] is a 29 y.o. female who presents for a postpartum visit. She is 6 weeks postpartum following a low cervical transverse Cesarean section. I have fully reviewed the prenatal and intrapartum course. The delivery was at 36 5/7 gestational weeks. Outcome: repeat cesarean section, low transverse incision. Anesthesia: spinal. Postpartum course has been uncomplicated. Baby's course has been complicated with a 1 week NICU stay.[name] [name] is feeding by bottle. Bleeding no bleeding. Bowel function is normal. Bladder function is normal. Contraception method is tubal ligation. Postpartum depression screening: negative.  The following portions of the patient's history were reviewed and updated as appropriate: allergies, current medications, past family history, past medical history, past social history, past surgical history and problem list.  Review of Systems Pertinent items are noted in HPI.   Objective:    BP 139/80 mmHg  Pulse 78  Temp(Src) 98.4 F (36.9 C) (Oral)  Ht  (1.549 m)  Wt 200 lb (90.719 kg)  BMI 37.81 kg/m2  LMP 11/29/2014  Breastfeeding? No       Pt in NAD Abd: soft, NT, ND, well healed transverse incision.  Assessment:     6 weeks postpartum exam. Pap smear not done at today's visit.   Pt with uncontrolled DM.  Pt not on Januvia as she reports that the insurance would not cover it.  She has not called her primary care docs ofc to notify them   Plan:    1. Contraception: tubal ligation 2.  Follow up in: 1 year or as needed.   3. HgbA1c today 4. Pt asked to call primary docs ofc for a f/u appt  Ryota Treece L. Harraway-Smith, M.D., [place]

## 2014-12-05 ENCOUNTER — Telehealth: Payer: Self-pay | Admitting: Family Medicine

## 2014-12-05 DIAGNOSIS — E1165 Type 2 diabetes mellitus with hyperglycemia: Secondary | ICD-10-CM

## 2014-12-05 DIAGNOSIS — IMO0002 Reserved for concepts with insufficient information to code with codable children: Secondary | ICD-10-CM

## 2014-12-05 NOTE — Telephone Encounter (Signed)
Patient out of her Januvia dm medication.  Nothing available for provider until 8/24.  Will sched appt for that date.  Please send refill to [facility] on [address].

## 2014-12-06 MED ORDER — SITAGLIPTIN PHOSPHATE 100 MG PO TABS: 100.0000 mg | ORAL_TABLET | Freq: Every day | ORAL | Status: DC

## 2014-12-06 NOTE — Telephone Encounter (Signed)
Refilled Claudean Severance, MD, MPH PGY-2,  [facility] 12/06/2014 2:10 PM

## 2014-12-14 ENCOUNTER — Telehealth: Payer: Self-pay

## 2014-12-14 NOTE — Telephone Encounter (Signed)
Pt needs to be informed that her AIC is elevated, has improved since last study, continue to take medication, and to f/u with her PCP.   Called pt and informed pt pf results and f/u.

## 2015-01-03 ENCOUNTER — Ambulatory Visit: Payer: Medicaid Other | Admitting: Family Medicine

## 2015-01-23 ENCOUNTER — Ambulatory Visit: Payer: Medicaid Other | Admitting: Family Medicine

## 2015-04-01 ENCOUNTER — Encounter (HOSPITAL_COMMUNITY): Payer: Self-pay

## 2015-04-01 ENCOUNTER — Emergency Department (HOSPITAL_COMMUNITY)
Admission: EM | Admit: 2015-04-01 | Discharge: 2015-04-01 | Disposition: A | Discharge status: Home / Self Care | Payer: Medicaid Other | Attending: Emergency Medicine | Admitting: Emergency Medicine

## 2015-04-01 DIAGNOSIS — Y998 Other external cause status: Secondary | ICD-10-CM | POA: Insufficient documentation

## 2015-04-01 DIAGNOSIS — I10 Essential (primary) hypertension: Secondary | ICD-10-CM | POA: Insufficient documentation

## 2015-04-01 DIAGNOSIS — Z79899 Other long term (current) drug therapy: Secondary | ICD-10-CM | POA: Insufficient documentation

## 2015-04-01 DIAGNOSIS — Y9389 Activity, other specified: Secondary | ICD-10-CM | POA: Insufficient documentation

## 2015-04-01 DIAGNOSIS — Z8742 Personal history of other diseases of the female genital tract: Secondary | ICD-10-CM | POA: Insufficient documentation

## 2015-04-01 DIAGNOSIS — E669 Obesity, unspecified: Secondary | ICD-10-CM | POA: Insufficient documentation

## 2015-04-01 DIAGNOSIS — Y9289 Other specified places as the place of occurrence of the external cause: Secondary | ICD-10-CM | POA: Insufficient documentation

## 2015-04-01 DIAGNOSIS — S335XXA Sprain of ligaments of lumbar spine, initial encounter: Secondary | ICD-10-CM | POA: Insufficient documentation

## 2015-04-01 DIAGNOSIS — Z8619 Personal history of other infectious and parasitic diseases: Secondary | ICD-10-CM | POA: Insufficient documentation

## 2015-04-01 DIAGNOSIS — E119 Type 2 diabetes mellitus without complications: Secondary | ICD-10-CM | POA: Insufficient documentation

## 2015-04-01 DIAGNOSIS — W1839XA Other fall on same level, initial encounter: Secondary | ICD-10-CM | POA: Insufficient documentation

## 2015-04-01 DIAGNOSIS — Z7984 Long term (current) use of oral hypoglycemic drugs: Secondary | ICD-10-CM | POA: Insufficient documentation

## 2015-04-01 MED ORDER — METHOCARBAMOL 500 MG PO TABS
500.0000 mg | ORAL_TABLET | Freq: Once | ORAL | Status: AC
  Administered 2015-04-01: 500 mg via ORAL
  Filled 2015-04-01: qty 1

## 2015-04-01 MED ORDER — METHOCARBAMOL 500 MG PO TABS: 1000.0000 mg | ORAL_TABLET | Freq: Four times a day (QID) | ORAL | Status: DC | PRN

## 2015-04-01 NOTE — ED Notes (Signed)
Pt reports she was sitting on a barrel yesterday and fell off. She now has back pain. Pt reports pain is intermittent and was ambulatory to room. Pt took ibuprofen with some relief at home.

## 2015-04-01 NOTE — Discharge Instructions (Signed)
For pain control you may take up to 800mg  of Motrin (also known as ibuprofen). That is usually 4 over the counter pills,  3 times a day. Take with food to minimize stomach irritation   For breakthrough pain you may take Robaxin. Do not drink alcohol, drive or operate heavy machinery when taking Robaxin.  Please follow with your primary care doctor in the next 2 days for a check-up. They must obtain records for further management.   Do not hesitate to return to the Emergency Department for any new, worsening or concerning symptoms.    Back Exercises The following exercises strengthen the muscles that help to support the back. They also help to keep the lower back flexible. Doing these exercises can help to prevent back pain or lessen existing pain. If you have back pain or discomfort, try doing these exercises 2-3 times each day or as told by your health care provider. When the pain goes away, do them once each day, but increase the number of times that you repeat the steps for each exercise (do more repetitions). If you do not have back pain or discomfort, do these exercises once each day or as told by your health care provider. EXERCISES Single Knee to Chest Repeat these steps 3-5 times for each leg: 1. Lie on your back on a firm bed or the floor with your legs extended. 2. Bring one knee to your chest. Your other leg should stay extended and in contact with the floor. 3. Hold your knee in place by grabbing your knee or thigh. 4. Pull on your knee until you feel a gentle stretch in your lower back. 5. Hold the stretch for 10-30 seconds. 6. Slowly release and straighten your leg. Pelvic Tilt Repeat these steps 5-10 times: 1. Lie on your back on a firm bed or the floor with your legs extended. 2. Bend your knees so they are pointing toward the ceiling and your feet are flat on the floor. 3. Tighten your lower abdominal muscles to press your lower back against the floor. This motion will tilt  your pelvis so your tailbone points up toward the ceiling instead of pointing to your feet or the floor. 4. With gentle tension and even breathing, hold this position for 5-10 seconds. Cat-Cow Repeat these steps until your lower back becomes more flexible: 1. Get into a hands-and-knees position on a firm surface. Keep your hands under your shoulders, and keep your knees under your hips. You may place padding under your knees for comfort. 2. Let your head hang down, and point your tailbone toward the floor so your lower back becomes rounded like the back of a cat. 3. Hold this position for 5 seconds. 4. Slowly lift your head and point your tailbone up toward the ceiling so your back forms a sagging arch like the back of a cow. 5. Hold this position for 5 seconds. Press-Ups Repeat these steps 5-10 times: 1. Lie on your abdomen (face-down) on the floor. 2. Place your palms near your head, about shoulder-width apart. 3. While you keep your back as relaxed as possible and keep your hips on the floor, slowly straighten your arms to raise the top half of your body and lift your shoulders. Do not use your back muscles to raise your upper torso. You may adjust the placement of your hands to make yourself more comfortable. 4. Hold this position for 5 seconds while you keep your back relaxed. 5. Slowly return to lying flat  on the floor. Bridges Repeat these steps 10 times: 1. Lie on your back on a firm surface. 2. Bend your knees so they are pointing toward the ceiling and your feet are flat on the floor. 3. Tighten your buttocks muscles and lift your buttocks off of the floor until your waist is at almost the same height as your knees. You should feel the muscles working in your buttocks and the back of your thighs. If you do not feel these muscles, slide your feet 1-2 inches farther away from your buttocks. 4. Hold this position for 3-5 seconds. 5. Slowly lower your hips to the starting position, and  allow your buttocks muscles to relax completely. If this exercise is too easy, try doing it with your arms crossed over your chest. Abdominal Crunches Repeat these steps 5-10 times: 1. Lie on your back on a firm bed or the floor with your legs extended. 2. Bend your knees so they are pointing toward the ceiling and your feet are flat on the floor. 3. Cross your arms over your chest. 4. Tip your chin slightly toward your chest without bending your neck. 5. Tighten your abdominal muscles and slowly raise your trunk (torso) high enough to lift your shoulder blades a tiny bit off of the floor. Avoid raising your torso higher than that, because it can put too much stress on your low back and it does not help to strengthen your abdominal muscles. 6. Slowly return to your starting position. Back Lifts Repeat these steps 5-10 times: 1. Lie on your abdomen (face-down) with your arms at your sides, and rest your forehead on the floor. 2. Tighten the muscles in your legs and your buttocks. 3. Slowly lift your chest off of the floor while you keep your hips pressed to the floor. Keep the back of your head in line with the curve in your back. Your eyes should be looking at the floor. 4. Hold this position for 3-5 seconds. 5. Slowly return to your starting position. SEEK MEDICAL CARE IF:  Your back pain or discomfort gets much worse when you do an exercise.  Your back pain or discomfort does not lessen within 2 hours after you exercise. If you have any of these problems, stop doing these exercises right away. Do not do them again unless your health care provider says that you can. SEEK IMMEDIATE MEDICAL CARE IF:  You develop sudden, severe back pain. If this happens, stop doing the exercises right away. Do not do them again unless your health care provider says that you can.   This information is not intended to replace advice given to you by your health care provider. Make sure you discuss any  questions you have with your health care provider.   Document Released: 06/05/2004 Document Revised: 01/17/2015 Document Reviewed: 06/22/2014 Elsevier Interactive Patient Education [employer]

## 2015-04-01 NOTE — ED Provider Notes (Signed)
CSN: [id number]     Arrival date & time 04/01/15  0919 History  By signing my name below, I, Eustaquio Maize, attest that this documentation has been prepared under the direction and in the presence of [employer], PA-C. Electronically Signed: Eustaquio Maize, ED Scribe. 04/01/2015. 9:48 AM.  Chief Complaint  Patient presents with  . Back Pain   The history is provided by the patient. No language interpreter was used.     HPI Comments: [name] [name] is a 29 y.o. female who presents to the Emergency Department complaining of gradual onset, constant, 7/10,  lower back pain that began yesterday. Pt was sitting on a barrel holding her daughter, when she fell backwards landing onto the tile floor on her back, causing the pain. Pt has been taking Ibuprofen with some relief. She is able to ambulate. Denies weakness, numbness, tingling, urinary or bowel incontinence, urinary issues, or any other associated symptoms.   Past Medical History  Diagnosis Date  . Hypertension   . Herpes simplex without mention of complication   . Obesity   . Diabetes mellitus     type II  . Irregular menses   . Abnormal Pap smear   . GBS (group B streptococcus) UTI complicating pregnancy    Past Surgical History  Procedure Laterality Date  . Cesarean section  01/30/2011    Procedure: CESAREAN SECTION;  Surgeon: [name], MD;  Location: [facility];  Service: Gynecology;  Laterality: N/A;  . Wisdom tooth extraction    . Cesarean section N/A 10/22/2014    Procedure: CESAREAN SECTION;  Surgeon: [name], MD;  Location: [facility];  Service: Obstetrics;  Laterality: N/A;   Family History  Problem Relation Age of Onset  . Diabetes Paternal Grandmother   . Hypertension Paternal Grandmother   . Diabetes Maternal Grandmother   . Diabetes Father   . Cancer Paternal Uncle    Social History  Substance Use Topics  . Smoking status: Never Smoker   . Smokeless tobacco: Never Used  . Alcohol Use: No   OB  History    Gravida Para Term Preterm AB TAB SAB Ectopic Multiple Living   4 2 1 1 2  2   0 2     Review of Systems A complete 10 system review of systems was obtained and all systems are negative except as noted in the HPI and PMH.    Allergies  Review of patient's allergies indicates no known allergies.  Home Medications   Prior to Admission medications   Medication Sig Start Date End Date Taking? Authorizing Provider  ACCU-CHEK FASTCLIX LANCETS MISC Check sugar up to 6 x daily 08/02/13   Coral Spikes, DO  acetaminophen (TYLENOL) 500 MG tablet Take 1,000 mg by mouth every 6 (six) hours as needed for mild pain.    Historical Provider, MD  amLODipine (NORVASC) 10 MG tablet Take 1 tablet (10 mg total) by mouth daily. Patient not taking: Reported on 12/04/2014 10/25/14   Nila Nephew, MD  Blood Glucose Monitoring Suppl (ACCU-CHEK NANO SMARTVIEW) W/DEVICE KIT Use up to 6 times daily to test blood sugars 08/02/13   Coral Spikes, DO  glimepiride (AMARYL) 4 MG tablet Take 1.5 tablets (6 mg total) by mouth daily before breakfast. 11/22/14   Olin Hauser, DO  hydrOXYzine (VISTARIL) 25 MG capsule Take 1-2 capsules (25-50 mg total) by mouth every 6 (six) hours as needed for itching. Patient not taking: Reported on 12/04/2014 11/04/14   [place]  Tamala Julian, CNM  Prenatal Vit-Fe Fumarate-FA (PRENATAL MULTIVITAMIN) TABS tablet Take 1 tablet by mouth daily at 12 noon.    Historical Provider, MD  sitaGLIPtin (JANUVIA) 100 MG tablet Take 1 tablet (100 mg total) by mouth daily. 12/06/14   [name], MD   Triage Vitals: BP 125/64 mmHg  Pulse 79  Temp(Src) 98.4 F (36.9 C) (Oral)  Resp 16  Ht 5' 1"  (1.549 m)  Wt 210 lb (95.255 kg)  BMI 39.70 kg/m2  SpO2 100%  LMP 03/26/2015   Physical Exam  Constitutional: She is oriented to person, place, and time. She appears well-developed and well-nourished. No distress.  HENT:  Head: Normocephalic and atraumatic.  Eyes: Conjunctivae and EOM are  normal.  Neck: Normal range of motion. Neck supple. No tracheal deviation present.  Cardiovascular: Normal rate, regular rhythm and intact distal pulses.   Pulmonary/Chest: Effort normal. No respiratory distress.  Abdominal: Soft. There is no tenderness.  Musculoskeletal: Normal range of motion.  Neurological: She is alert and oriented to person, place, and time.  No point tenderness to percussion of lumbar spinal processes.  No TTP or paraspinal muscular spasm. Strength is 5 out of 5 to bilateral lower extremities at hip and knee; extensor hallucis longus 5 out of 5. Ankle strength 5 out of 5, no clonus, neurovascularly intact. No saddle anaesthesia. Patellar reflexes are 2+ bilaterally.    Ambulates with a coordinated in nonantalgic gait  Skin: Skin is warm and dry.  Psychiatric: She has a normal mood and affect. Her behavior is normal.  Nursing note and vitals reviewed.   ED Course  Procedures (including critical care time)  DIAGNOSTIC STUDIES: Oxygen Saturation is 100% on RA, normal by my interpretation.    COORDINATION OF CARE: 9:47 AM-Discussed treatment plan which includes  Rx muscle relaxant with pt at bedside and pt agreed to plan.   Labs Review Labs Reviewed - No data to display  Imaging Review No results found.    EKG Interpretation None      MDM   Final diagnoses:  Lumbar sprain, initial encounter    Filed Vitals:   04/01/15 0930  BP: 125/64  Pulse: 79  Temp: 98.4 F (36.9 C)  TempSrc: Oral  Resp: 16  Height: 5' 1"  (1.549 m)  Weight: 210 lb (95.255 kg)  SpO2: 100%    Medications  methocarbamol (ROBAXIN) tablet 500 mg (500 mg Oral Given 04/01/15 0951)    [name] is 29 y.o. female presenting with  back pain.  No neurological deficits and normal neuro exam.  Patient can walk but states is painful.  No loss of bowel or bladder control.  No concern for cauda equina.  No fever, night sweats, weight loss, h/o cancer, IVDU.  RICE protocol and pain  medicine indicated and discussed with patient.   Evaluation does not show pathology that would require ongoing emergent intervention or inpatient treatment. Pt is hemodynamically stable and mentating appropriately. Discussed findings and plan with patient/guardian, who agrees with care plan. All questions answered. Return precautions discussed and outpatient follow up given.   Discharge Medication List as of 04/01/2015  9:49 AM    START taking these medications   Details  methocarbamol (ROBAXIN) 500 MG tablet Take 2 tablets (1,000 mg total) by mouth 4 (four) times daily as needed (Pain)., Starting 04/01/2015, Until Discontinued, Print         I personally performed the services described in this documentation, which was scribed in my presence. The recorded  information has been reviewed and is accurate.      Monico Blitz, PA-C 04/01/15 1627  Charlesetta Shanks, MD 04/11/15 1436

## 2015-05-02 ENCOUNTER — Telehealth: Payer: Self-pay | Admitting: Family Medicine

## 2015-05-02 NOTE — Telephone Encounter (Signed)
Patient informed that she will need CPE in order for these forms to be completed, appointment scheduled for 1/3, forms placed in PCP box.

## 2015-05-02 NOTE — Telephone Encounter (Signed)
Forms dropped off to be filled out for job.  Please call when ready to be picked up.

## 2015-05-15 ENCOUNTER — Ambulatory Visit (INDEPENDENT_AMBULATORY_CARE_PROVIDER_SITE_OTHER): Payer: Self-pay | Admitting: Family Medicine

## 2015-05-15 ENCOUNTER — Encounter: Payer: Self-pay | Admitting: Family Medicine

## 2015-05-15 VITALS — BP 133/66 | HR 94 | Temp 98.8°F | Ht 62.0 in | Wt 216.0 lb

## 2015-05-15 DIAGNOSIS — IMO0001 Reserved for inherently not codable concepts without codable children: Secondary | ICD-10-CM

## 2015-05-15 DIAGNOSIS — E669 Obesity, unspecified: Secondary | ICD-10-CM

## 2015-05-15 DIAGNOSIS — E1165 Type 2 diabetes mellitus with hyperglycemia: Secondary | ICD-10-CM

## 2015-05-15 LAB — POCT GLYCOSYLATED HEMOGLOBIN (HGB A1C): Hemoglobin A1C: 9.3

## 2015-05-15 MED ORDER — GLIMEPIRIDE 4 MG PO TABS: 8.0000 mg | ORAL_TABLET | Freq: Every day | ORAL | Status: DC

## 2015-05-15 NOTE — Patient Instructions (Signed)
Nice to see you again today. Your A1c was 9.3 today. We will go up on the glimepiride 8 mg daily. You can take 2 of your current pills daily until you run out and get the new prescription. Continue the Januvia as well. I like see back in 3 months for diabetes follow-up. Check in the front desk for financial assistance.  Take care,  Dr. BLeonard Schwartz

## 2015-05-15 NOTE — Progress Notes (Signed)
   Subjective:   Zurii D Tera MaterCowan is a 30 y.o. female with a history of T2DM, obesity here for form completion and T2DM f/u  T2DM - Checking BG at home: no - Medications: Januvia 100mg  daily and glimepiride 4mg  daily - Compliance: good - Diet: no specific diet - Exercise: none - needs foot and eye exam at next visit  [facility].  Copy should be scanned into chart.    Review of Systems:  Per HPI. All other systems reviewed and are negative.   PMH, PSH, Medications, Allergies, and FmHx reviewed and updated in EMR.  Social History: never smoker  Objective:  BP 133/66 mmHg  Pulse 94  Temp(Src) 98.8 F (37.1 C) (Oral)  Ht 5\' 2"  (1.575 m)  Wt 216 lb (97.977 kg)  BMI 39.50 kg/m2  Gen:  30 y.o. female in NAD HEENT: NCAT, MMM, EOMI, PERRL, anicteric sclerae CV: RRR, no MRG, intact distal pulses Resp: Non-labored, CTAB, no wheezes noted Ext: WWP, no edema MSK: No obvious deformities Neuro: Alert and oriented, speech normal    Lab Results  Component Value Date   HGBA1C 9.3 05/15/2015   Assessment & Plan:     Serena ColonelCrystal D Shartzer is a 30 y.o. female here for form completion and T2DM f/u  Type II diabetes mellitus, uncontrolled A1c elevated to 9.3 Continue Januvia Increase glimepiride to 8mg  daily Patient unable to tolerate metformin in the past Need to consider insulin at next visit if A1c not improved Needs eye exam and foot exam at next visit Also need to consider starting ACEi at next visit Patient unable to afford lab testing today - tto speak with financial counselors and obtain CMET and lipid panel at next visit F/u in 3 months  Obesity Advised diet and exercise interventions Weight loss would likely also help with T2DM control    Erasmo DownerAngela M Mansoor Hillyard, MD MPH PGY-2,  [facility] 05/17/2015  2:45 PM

## 2015-05-17 NOTE — Assessment & Plan Note (Signed)
Advised diet and exercise interventions Weight loss would likely also help with T2DM control

## 2015-05-17 NOTE — Telephone Encounter (Signed)
Patient informed that forms are complete and faxed per request.  Faxed to [phone] [employer] [facility] [employer].  Original forms placed up front for pickup.  Clovis PuMartin, Lenka Zhao L, RN

## 2015-05-17 NOTE — Assessment & Plan Note (Signed)
A1c elevated to 9.3 Continue Januvia Increase glimepiride to 8mg  daily Patient unable to tolerate metformin in the past Need to consider insulin at next visit if A1c not improved Needs eye exam and foot exam at next visit Also need to consider starting ACEi at next visit Patient unable to afford lab testing today - tto speak with financial counselors and obtain CMET and lipid panel at next visit F/u in 3 months

## 2015-06-30 IMAGING — CR DG CHEST 2V
2 series · 2 of 2 positions shown · non-contrast
Comparison: 10/23/2012

CLINICAL DATA: Initial encounter for 1 week history of dry cough

EXAM:
CHEST  2 VIEW

[chest pa]
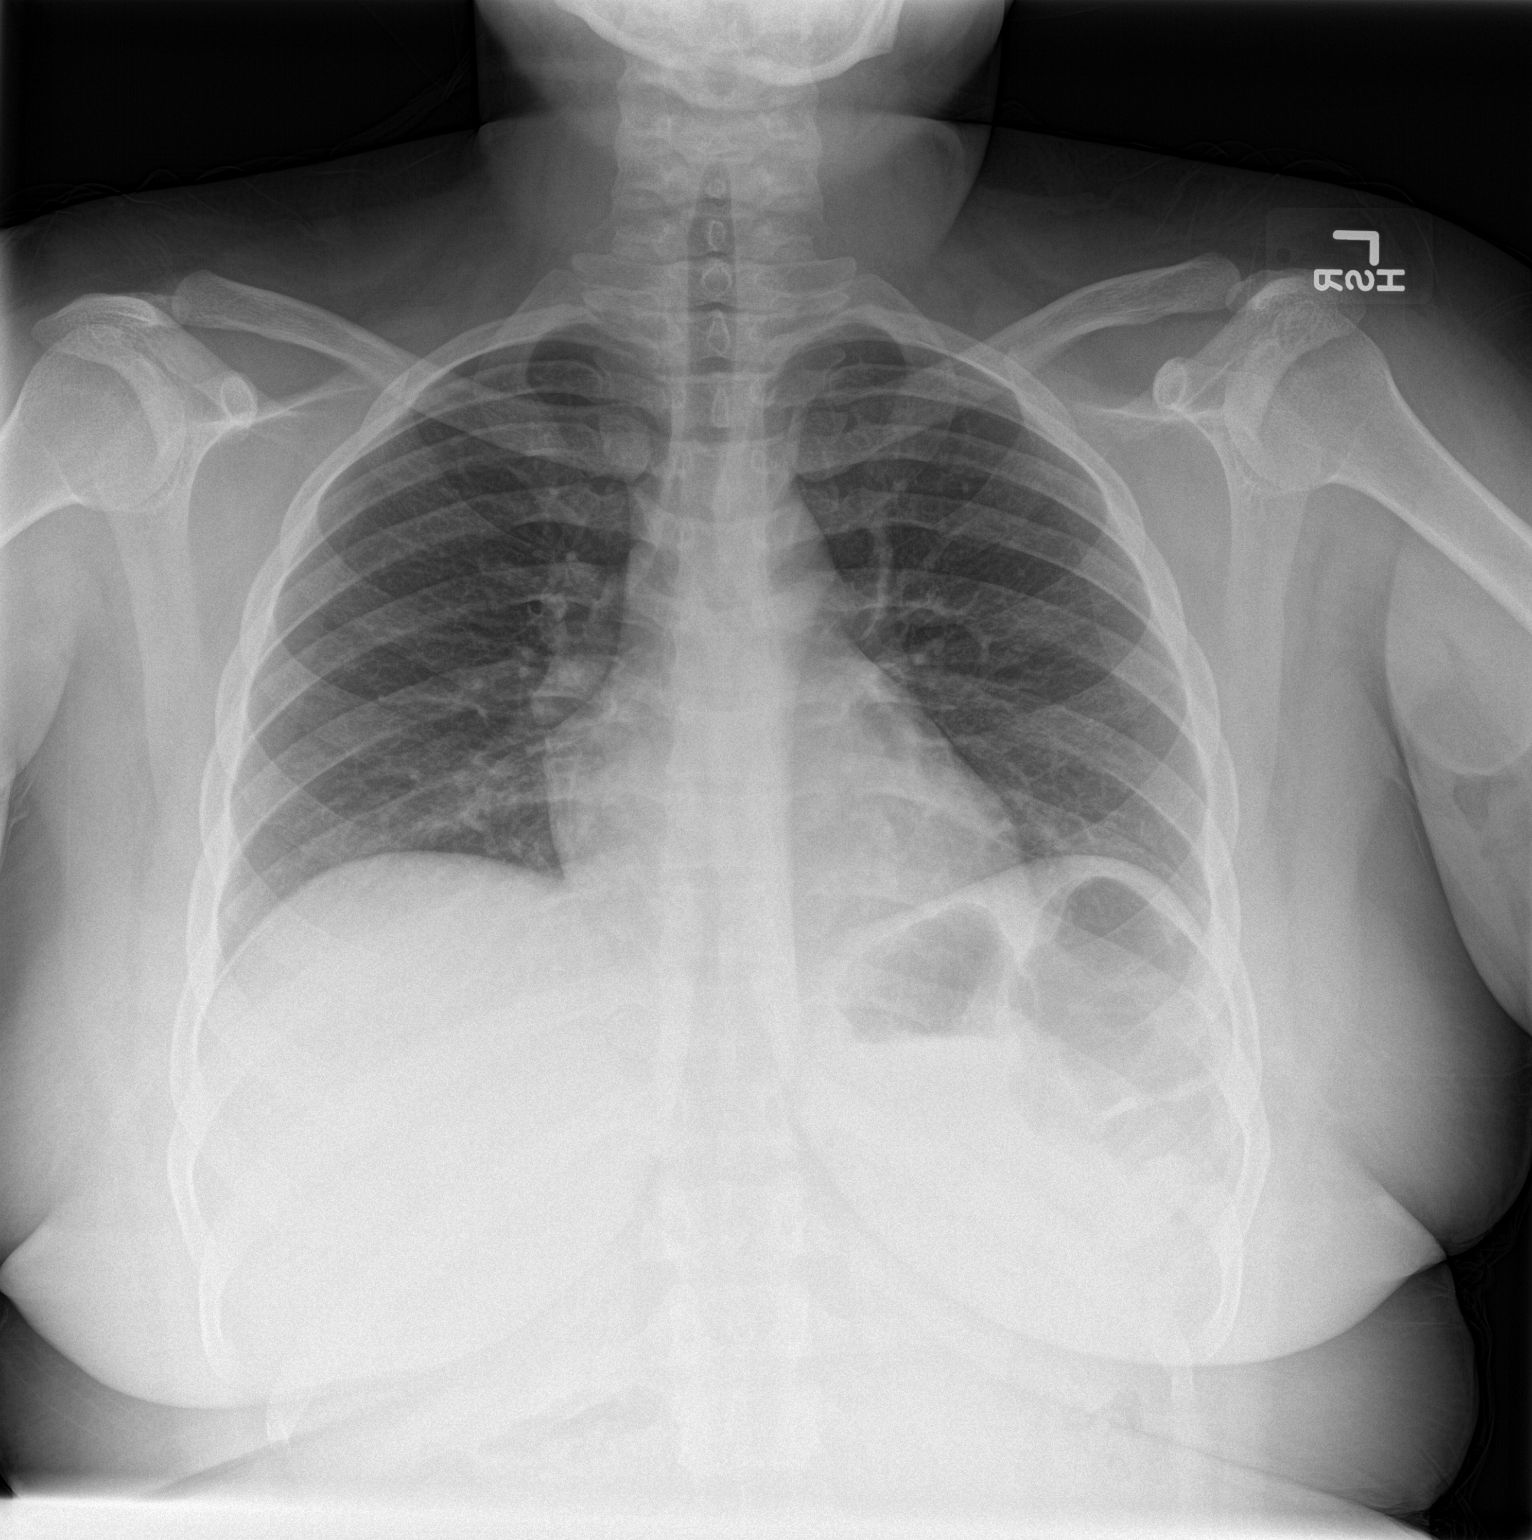

[chest lat]
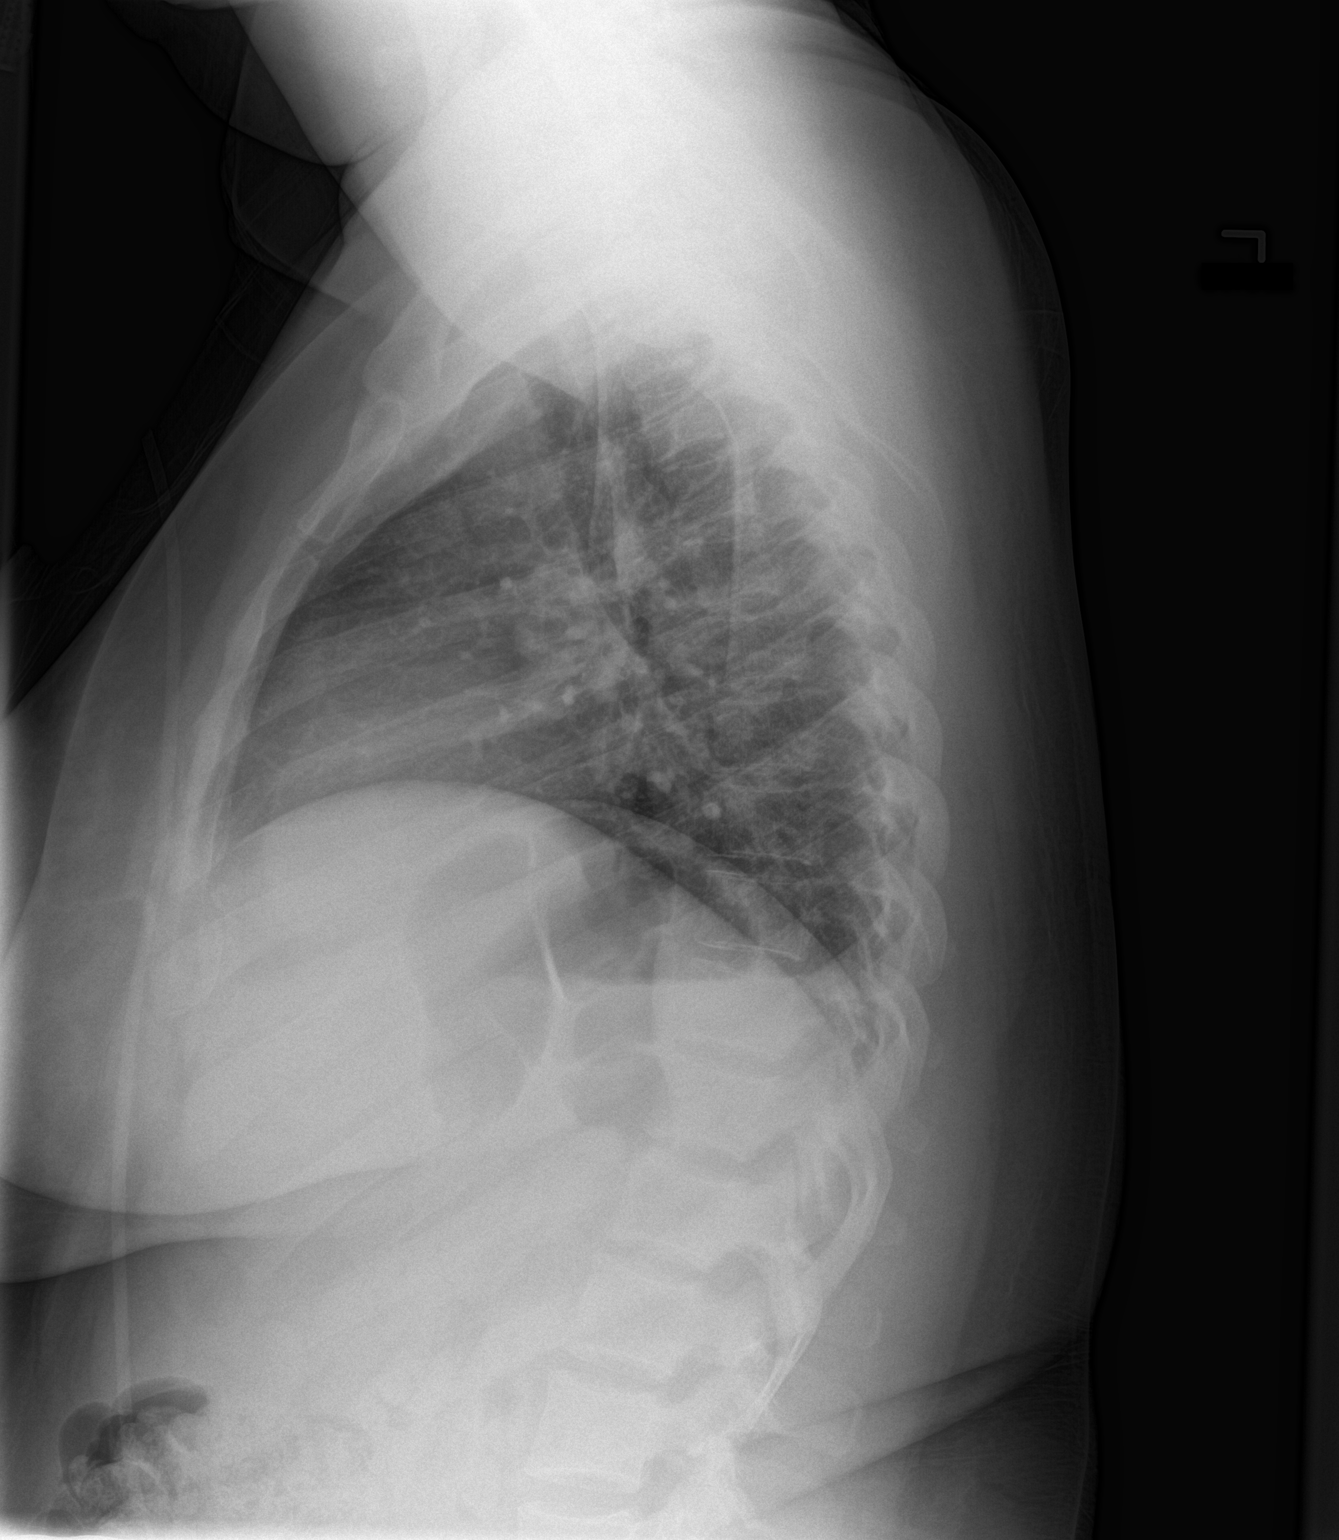

[2 of 2 positions shown; findings below may reference images not displayed]

FINDINGS: Lung volumes are low bilaterally. The lungs are clear without focal
infiltrate, edema, pneumothorax or pleural effusion. The
cardiopericardial silhouette is within normal limits for size.
Imaged bony structures of the thorax are intact.
IMPRESSION: Low lung volumes.  Otherwise normal exam.

## 2015-07-04 ENCOUNTER — Encounter: Payer: Self-pay | Admitting: *Deleted

## 2015-07-13 ENCOUNTER — Ambulatory Visit: Payer: Medicaid Other

## 2015-07-16 ENCOUNTER — Encounter (HOSPITAL_COMMUNITY): Payer: Self-pay | Admitting: Emergency Medicine

## 2015-07-16 ENCOUNTER — Emergency Department (HOSPITAL_COMMUNITY)
Admission: EM | Admit: 2015-07-16 | Discharge: 2015-07-16 | Disposition: A | Discharge status: Home / Self Care | Payer: Medicaid Other | Attending: Emergency Medicine | Admitting: Emergency Medicine

## 2015-07-16 DIAGNOSIS — Z8619 Personal history of other infectious and parasitic diseases: Secondary | ICD-10-CM | POA: Insufficient documentation

## 2015-07-16 DIAGNOSIS — I10 Essential (primary) hypertension: Secondary | ICD-10-CM | POA: Insufficient documentation

## 2015-07-16 DIAGNOSIS — R05 Cough: Secondary | ICD-10-CM | POA: Insufficient documentation

## 2015-07-16 DIAGNOSIS — R6889 Other general symptoms and signs: Secondary | ICD-10-CM

## 2015-07-16 DIAGNOSIS — R5383 Other fatigue: Secondary | ICD-10-CM | POA: Insufficient documentation

## 2015-07-16 DIAGNOSIS — R509 Fever, unspecified: Secondary | ICD-10-CM | POA: Insufficient documentation

## 2015-07-16 DIAGNOSIS — R630 Anorexia: Secondary | ICD-10-CM | POA: Insufficient documentation

## 2015-07-16 DIAGNOSIS — Z8742 Personal history of other diseases of the female genital tract: Secondary | ICD-10-CM | POA: Insufficient documentation

## 2015-07-16 DIAGNOSIS — E119 Type 2 diabetes mellitus without complications: Secondary | ICD-10-CM | POA: Insufficient documentation

## 2015-07-16 DIAGNOSIS — R51 Headache: Secondary | ICD-10-CM | POA: Insufficient documentation

## 2015-07-16 DIAGNOSIS — J029 Acute pharyngitis, unspecified: Secondary | ICD-10-CM | POA: Insufficient documentation

## 2015-07-16 DIAGNOSIS — E669 Obesity, unspecified: Secondary | ICD-10-CM | POA: Insufficient documentation

## 2015-07-16 LAB — CBG MONITORING, ED: Glucose-Capillary: 195 mg/dL — ABNORMAL HIGH (ref 65–99)

## 2015-07-16 MED ORDER — BENZONATATE 100 MG PO CAPS
100.0000 mg | ORAL_CAPSULE | Freq: Once | ORAL | Status: AC
  Administered 2015-07-16: 100 mg via ORAL
  Filled 2015-07-16: qty 1

## 2015-07-16 MED ORDER — OSELTAMIVIR PHOSPHATE 75 MG PO CAPS: 75.0000 mg | ORAL_CAPSULE | Freq: Two times a day (BID) | ORAL | Status: DC

## 2015-07-16 NOTE — ED Notes (Signed)
Pt states that her father has the flu and she thinks she caught it. Generalized body aches, chills and cough x 2 days. Alert and oriented.

## 2015-07-16 NOTE — ED Provider Notes (Signed)
CSN: [id number]     Arrival date & time 07/16/15  1910 History  By signing my name below, I, [name], attest that this documentation has been prepared under the direction and in the presence of [name], PA-C. Electronically Signed: [name], ED Scribe. 07/16/2015. 11:07 PM.    Chief Complaint  Patient presents with  . Flu-like symptoms     The history is provided by the patient. No language interpreter was used.   HPI Comments: [name] [name] is a 30 y.o. female with an hx of HTN and DM who presents to the Emergency Department complaining of constant, mild, gradually worsening generalized body aches yesterday. Patient reports chills, nonproductive cough, sore throat, HA, decreased appetite, fever TMAX 101, and fatigue. She has taken ibuprofen with relief of her fever only. She notes recent sick contact with her father who had similar symptoms and who was diagnosed with the flu by his PCP. Hx of DM and well-controlled with medications. She denies receiving the influenza vaccine this year. Patient denies CP, SOB, wheezing, nausea, vomiting, diarrhea, abdominal pain, and ear pain.   Past Medical History  Diagnosis Date  . Hypertension   . Herpes simplex without mention of complication   . Obesity   . Diabetes mellitus     type II  . Irregular menses   . Abnormal Pap smear   . GBS (group B streptococcus) UTI complicating pregnancy    Past Surgical History  Procedure Laterality Date  . Cesarean section  01/30/2011    Procedure: CESAREAN SECTION;  Surgeon: [name], MD;  Location: [facility];  Service: Gynecology;  Laterality: N/A;  . Wisdom tooth extraction    . Cesarean section N/A 10/22/2014    Procedure: CESAREAN SECTION;  Surgeon: [name], MD;  Location: [facility];  Service: Obstetrics;  Laterality: N/A;   Family History  Problem Relation Age of Onset  . Diabetes Paternal Grandmother   . Hypertension Paternal Grandmother   . Diabetes Maternal  Grandmother   . Diabetes Father   . Cancer Paternal Uncle    Social History  Substance Use Topics  . Smoking status: Never Smoker   . Smokeless tobacco: Never Used  . Alcohol Use: No   OB History    Gravida Para Term Preterm AB TAB SAB Ectopic Multiple Living   4 2 1 1 2  2   0 2     Review of Systems  Constitutional: Positive for fever, chills, appetite change (decreased) and fatigue.  HENT: Positive for sore throat. Negative for ear pain.   Respiratory: Positive for cough. Negative for shortness of breath and wheezing.   Cardiovascular: Negative for chest pain.  Gastrointestinal: Negative for nausea, vomiting, abdominal pain and diarrhea.  Neurological: Positive for headaches.  All other systems reviewed and are negative.     Allergies  Review of patient's allergies indicates no known allergies.  Home Medications   Prior to Admission medications   Medication Sig Start Date End Date Taking? Authorizing Provider  ACCU-CHEK FASTCLIX LANCETS MISC Check sugar up to 6 x daily 08/02/13   Coral Spikes, DO  acetaminophen (TYLENOL) 500 MG tablet Take 1,000 mg by mouth every 6 (six) hours as needed for mild pain.    Historical Provider, MD  Blood Glucose Monitoring Suppl (ACCU-CHEK NANO SMARTVIEW) W/DEVICE KIT Use up to 6 times daily to test blood sugars 08/02/13   Coral Spikes, DO  glimepiride (AMARYL) 4 MG tablet Take 2 tablets (8  mg total) by mouth daily before breakfast. 05/15/15   [name], MD  hydrOXYzine (VISTARIL) 25 MG capsule Take 1-2 capsules (25-50 mg total) by mouth every 6 (six) hours as needed for itching. Patient not taking: Reported on 12/04/2014 11/04/14   [name], CNM  methocarbamol (ROBAXIN) 500 MG tablet Take 2 tablets (1,000 mg total) by mouth 4 (four) times daily as needed (Pain). 04/01/15   [name], PA-C  oseltamivir (TAMIFLU) 75 MG capsule Take 1 capsule (75 mg total) by mouth every 12 (twelve) hours. 07/16/15   [name], PA-C   Prenatal Vit-Fe Fumarate-FA (PRENATAL MULTIVITAMIN) TABS tablet Take 1 tablet by mouth daily at 12 noon.    Historical Provider, MD  sitaGLIPtin (JANUVIA) 100 MG tablet Take 1 tablet (100 mg total) by mouth daily. 12/06/14   [name], MD   BP 149/87 mmHg  Pulse 86  Temp(Src) 99.5 F (37.5 C) (Oral)  Resp 20  SpO2 100%  LMP 06/24/2015 (Approximate) Physical Exam  Constitutional: She is oriented to person, place, and time. She appears well-developed and well-nourished. No distress.  HENT:  Head: Normocephalic and atraumatic.  Right Ear: Tympanic membrane and ear canal normal.  Left Ear: Tympanic membrane and ear canal normal.  Nose: Nose normal.  Mouth/Throat: Uvula is midline, oropharynx is clear and moist and mucous membranes are normal.  Eyes: Conjunctivae and EOM are normal. Pupils are equal, round, and reactive to light.  Neck: Normal range of motion. Neck supple. No tracheal deviation present.  Cardiovascular: Normal rate, regular rhythm and normal heart sounds.  Exam reveals no gallop and no friction rub.   No murmur heard. Pulmonary/Chest: Effort normal and breath sounds normal. No respiratory distress. She has no wheezes. She has no rales.  Abdominal: Soft. She exhibits no distension. There is no tenderness.  No signs of abdominal distention  Musculoskeletal: Normal range of motion.  No LE swelling  Neurological: She is alert and oriented to person, place, and time.  Acting at baseline  Skin: Skin is warm and dry. No rash noted.  Psychiatric: She has a normal mood and affect. Her behavior is normal.  Nursing note and vitals reviewed.   ED Course  Procedures   DIAGNOSTIC STUDIES: Oxygen Saturation is 100% on RA, normal by my interpretation.    COORDINATION OF CARE: 10:54 PM Discussed prescribing Tamiflu and pt agreed. Advised pt of return precautions if symptoms worsen. Will provide pt with work note for 2 days. Discussed treatment plan with pt at bedside  and pt agreed to plan.  Labs Review Labs Reviewed  CBG MONITORING, ED - Abnormal; Notable for the following:    Glucose-Capillary 195 (*)    All other components within normal limits    Imaging Review No results found. I have personally reviewed and evaluated these images and lab results as part of my medical decision-making.   EKG Interpretation None      MDM   Final diagnoses:  Flu-like symptoms    MDM Number of Diagnoses or Management Options Flu-like symptoms:   Patient with symptoms consistent with influenza.  Vitals are stable, low-grade fever.  No signs of dehydration, tolerating PO's.  Lungs are clear. Due to patient's presentation and physical exam a chest x-ray was not ordered bc likely diagnosis of flu.  Discussed the cost versus benefit of Tamiflu treatment with the patient.  The patients symptoms fit within the recommended 24-48 hours of symptoms and we discussed starting her on Tamiflu which she would  like.  Patient will be discharged with instructions to orally hydrate, rest, and use over-the-counter medications such as anti-inflammatories ibuprofen and Aleve for muscle aches and Tylenol for fever.  Patient will also be given a cough suppressant.    I personally performed the services described in this documentation, which was scribed in my presence. The recorded information has been reviewed and is accurate.    [name], PA-C 07/16/15 2307  Lacretia Leigh, MD 07/17/15 [phone]

## 2015-07-16 NOTE — Discharge Instructions (Signed)

## 2015-10-01 ENCOUNTER — Other Ambulatory Visit: Payer: Self-pay | Admitting: Family Medicine

## 2015-10-01 DIAGNOSIS — IMO0001 Reserved for inherently not codable concepts without codable children: Secondary | ICD-10-CM

## 2015-10-01 DIAGNOSIS — E1165 Type 2 diabetes mellitus with hyperglycemia: Principal | ICD-10-CM

## 2015-10-01 MED ORDER — GLIMEPIRIDE 4 MG PO TABS: 8.0000 mg | ORAL_TABLET | Freq: Every day | ORAL | Status: DC

## 2015-10-01 NOTE — Telephone Encounter (Signed)
Refill sent.  Please have patient make DM f/u appt.  Erasmo DownerAngela M Aras Albarran, MD, MPH PGY-2,  [facility] 10/01/2015 10:53 AM

## 2015-10-01 NOTE — Telephone Encounter (Signed)
Need refill on her glimepiride [facility] [address]

## 2015-10-02 NOTE — Telephone Encounter (Signed)
Pt informed and she scheduled am appt. Deseree Bruna PotterBlount, CMA

## 2015-10-19 ENCOUNTER — Ambulatory Visit: Payer: Medicaid Other | Admitting: Family Medicine

## 2015-12-14 ENCOUNTER — Ambulatory Visit: Payer: Medicaid Other | Attending: Internal Medicine

## 2015-12-18 ENCOUNTER — Ambulatory Visit: Payer: Medicaid Other | Attending: Internal Medicine

## 2016-01-03 ENCOUNTER — Ambulatory Visit: Payer: Medicaid Other | Admitting: Internal Medicine

## 2016-01-14 ENCOUNTER — Encounter (HOSPITAL_COMMUNITY): Payer: Self-pay | Admitting: Emergency Medicine

## 2016-01-14 ENCOUNTER — Emergency Department (HOSPITAL_COMMUNITY)
Admission: EM | Admit: 2016-01-14 | Discharge: 2016-01-15 | Disposition: A | Discharge status: Home / Self Care | Payer: Medicaid Other | Attending: Emergency Medicine | Admitting: Emergency Medicine

## 2016-01-14 DIAGNOSIS — I10 Essential (primary) hypertension: Secondary | ICD-10-CM | POA: Insufficient documentation

## 2016-01-14 DIAGNOSIS — Z7984 Long term (current) use of oral hypoglycemic drugs: Secondary | ICD-10-CM | POA: Insufficient documentation

## 2016-01-14 DIAGNOSIS — E119 Type 2 diabetes mellitus without complications: Secondary | ICD-10-CM | POA: Insufficient documentation

## 2016-01-14 DIAGNOSIS — B3731 Acute candidiasis of vulva and vagina: Secondary | ICD-10-CM

## 2016-01-14 DIAGNOSIS — B373 Candidiasis of vulva and vagina: Secondary | ICD-10-CM

## 2016-01-14 LAB — URINALYSIS, ROUTINE W REFLEX MICROSCOPIC
BILIRUBIN URINE: NEGATIVE
KETONES UR: 15 mg/dL — AB
Leukocytes, UA: NEGATIVE
Nitrite: NEGATIVE
PROTEIN: NEGATIVE mg/dL
Specific Gravity, Urine: 1.04 — ABNORMAL HIGH (ref 1.005–1.030)
pH: 7 (ref 5.0–8.0)

## 2016-01-14 LAB — URINE MICROSCOPIC-ADD ON

## 2016-01-14 LAB — WET PREP, GENITAL
CLUE CELLS WET PREP: NONE SEEN
Sperm: NONE SEEN
TRICH WET PREP: NONE SEEN

## 2016-01-14 LAB — PREGNANCY, URINE: Preg Test, Ur: NEGATIVE

## 2016-01-14 MED ORDER — FLUCONAZOLE 100 MG PO TABS
150.0000 mg | ORAL_TABLET | Freq: Once | ORAL | Status: AC
  Administered 2016-01-15: 150 mg via ORAL
  Filled 2016-01-14: qty 2

## 2016-01-14 MED ORDER — FLUCONAZOLE 150 MG PO TABS: ORAL_TABLET | ORAL | 0 refills | Status: DC

## 2016-01-14 NOTE — ED Triage Notes (Signed)
Pt. reports vaginal itching " yeast" onset yesterday with mild vaginal discharge .

## 2016-01-14 NOTE — ED Provider Notes (Signed)
[facility]-EMERGENCY DEPT Provider Note   CSN: [id number] Arrival date & time: 01/14/16  2010     History   Chief Complaint Chief Complaint  Patient presents with  . Vaginal Itching    HPI [name] [name] is a 30 y.o. female.  The history is provided by the patient and medical records. No language interpreter was used.  Vaginal Itching  Pertinent negatives include no abdominal pain and no shortness of breath.   [name] [name] is a 30 y.o. female  with a PMH of diabetes who presents to the Emergency Department complaining of persistent vaginal itching that began yesterday associated with thin yellow discharge. She tried topical yeast cream with no relief in symptoms. She states that she gets yeast infections very frequently and this feels similar, however symptoms typically resolve after a day or two of topical cream. She also endorses dysuria. No urinary frequency/hestitancy. No abdominal pain, n/v/d, fevers, back pain. No recent illness.   Past Medical History:  Diagnosis Date  . Abnormal Pap smear   . Diabetes mellitus    type II  . GBS (group B streptococcus) UTI complicating pregnancy   . Herpes simplex without mention of complication   . Hypertension   . Irregular menses   . Obesity     Patient Active Problem List   Diagnosis Date Noted  . Alopecia areata 09/29/2013  . Type II diabetes mellitus, uncontrolled (HCC) 09/20/2007  . 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 Obesity 07/09/2006  . RHINITIS, ALLERGIC 07/09/2006  . PAPANICOLAOU SMEAR, ABNORMAL 07/09/2006    Past Surgical History:  Procedure Laterality Date  . CESAREAN SECTION  01/30/2011   Procedure: CESAREAN SECTION;  Surgeon: Tilda Burrow, MD;  Location: [facility];  Service: Gynecology;  Laterality: N/A;  . CESAREAN SECTION N/A 10/22/2014   Procedure: CESAREAN SECTION;  Surgeon: Reva Bores, MD;  Location: [facility];  Service: Obstetrics;  Laterality: N/A;  . WISDOM TOOTH EXTRACTION      OB  History    Gravida Para Term Preterm AB Living   4 2 1 1 2 2    SAB TAB Ectopic Multiple Live Births   2     0 2       Home Medications    Prior to Admission medications   Medication Sig Start Date End Date Taking? Authorizing Provider  ACCU-CHEK FASTCLIX LANCETS MISC Check sugar up to 6 x daily 08/02/13  Yes Tommie Sams, DO  acetaminophen (TYLENOL) 500 MG tablet Take 1,000 mg by mouth every 6 (six) hours as needed for mild pain.   Yes Historical Provider, MD  glimepiride (AMARYL) 4 MG tablet Take 2 tablets (8 mg total) by mouth daily before breakfast. 10/01/15  Yes Erasmo Downer, MD  fluconazole (DIFLUCAN) 150 MG tablet Take 1 tablet (150mg  total) by mouth once in 3 days if symptoms persist. 01/14/16   Chase Picket Elsia Lasota, PA-C    Family History Family History  Problem Relation Age of Onset  . Diabetes Paternal Grandmother   . Hypertension Paternal Grandmother   . Diabetes Maternal Grandmother   . Diabetes Father   . Cancer Paternal Uncle     Social History Social History  Substance Use Topics  . Smoking status: Never Smoker  . Smokeless tobacco: Never Used  . Alcohol use No     Allergies   Review of patient's allergies indicates no known allergies.   Review of Systems Review of Systems  Constitutional: Negative for fever.  HENT: Negative for congestion.   Eyes: Negative for visual disturbance.  Respiratory: Negative for cough and shortness of breath.   Cardiovascular: Negative.   Gastrointestinal: Negative for abdominal pain, diarrhea, nausea and vomiting.  Genitourinary: Positive for dysuria and vaginal discharge.  Musculoskeletal: Negative for back pain.  Skin: Negative for rash.  Allergic/Immunologic: Positive for immunocompromised state (Diabetes).     Physical Exam Updated Vital Signs BP 139/74 (BP Location: Right Arm)   Pulse 100   Temp 98.8 F (37.1 C) (Oral)   Resp 18   LMP 12/20/2015   SpO2 98%   Physical Exam  Constitutional: She is  oriented to person, place, and time. She appears well-developed and well-nourished. No distress.  HENT:  Head: Normocephalic and atraumatic.  Cardiovascular: Normal rate, regular rhythm and normal heart sounds.   Pulmonary/Chest: Effort normal and breath sounds normal. No respiratory distress.  Abdominal: Soft. Bowel sounds are normal. She exhibits no distension. There is no tenderness.  Genitourinary:  Genitourinary Comments: Chaperone present for exam. + vaginal bleeding making it difficult to discern if vaginal discharge is present.  No rashes, lesions, or tenderness to external genitalia. No erythema, injury, or tenderness to vaginal mucosa. No adnexal masses, tenderness, or fullness. No CMT.  Neurological: She is alert and oriented to person, place, and time.  Skin: Skin is warm and dry.  Nursing note and vitals reviewed.    ED Treatments / Results  Labs (all labs ordered are listed, but only abnormal results are displayed) Labs Reviewed  WET PREP, GENITAL - Abnormal; Notable for the following:       Result Value   Yeast Wet Prep HPF POC PRESENT (*)    WBC, Wet Prep HPF POC MANY (*)    All other components within normal limits  URINALYSIS, ROUTINE W REFLEX MICROSCOPIC (NOT AT [facility]) - Abnormal; Notable for the following:    APPearance CLOUDY (*)    Specific Gravity, Urine 1.040 (*)    Glucose, UA >1000 (*)    Hgb urine dipstick MODERATE (*)    Ketones, ur 15 (*)    All other components within normal limits  URINE MICROSCOPIC-ADD ON - Abnormal; Notable for the following:    Squamous Epithelial / LPF 0-5 (*)    Bacteria, UA MANY (*)    All other components within normal limits  PREGNANCY, URINE  GC/CHLAMYDIA PROBE AMP ([facility]) NOT AT [facility]    EKG  EKG Interpretation None       Radiology No results found.  Procedures Procedures (including critical care time)  Medications Ordered in ED Medications  fluconazole (DIFLUCAN) tablet 150 mg (not administered)      Initial Impression / Assessment and Plan / ED Course  I have reviewed the triage vital signs and the nursing notes.  Pertinent labs & imaging results that were available during my care of the patient were reviewed by me and considered in my medical decision making (see chart for details).  Clinical Course  Value Comment By Time  Hgb urine dipstick: (!) MODERATE First day of menstrual cycle Chase Picket Samule Life, PA-C 09/04 2342   Tamea D Fouts is a 30 y.o. female who presents to ED for vaginal itching, vaginal discharge and dysuria since yesterday. On exam, patient is afebrile, well appearing with benign abdominal exam. No cervical motion or adnexal tenderness. Wet prep shows + yeast and many WBC's. -- will treat with diflucan. UA reviewed with no signs of infection. >1000 glucose: discussed importance of improving glycemic  control. OBGYN follow up strongly encouraged. Return precautions discussed and all questions answered.   Final Clinical Impressions(s) / ED Diagnoses   Final diagnoses:  Yeast vaginitis    New Prescriptions New Prescriptions   FLUCONAZOLE (DIFLUCAN) 150 MG TABLET    Take 1 tablet (150mg  total) by mouth once in 3 days if symptoms persist.     Chase PicketJaime Pilcher Bianca Vester, PA-C 01/14/16 2344    Jacalyn LefevreJulie Haviland, MD 01/14/16 [phone]

## 2016-01-14 NOTE — ED Notes (Signed)
Pelvic cart ready outside of room

## 2016-01-14 NOTE — Discharge Instructions (Signed)
Follow up with your OBGYN or primary physician in regard's to your recurrent yeast infections.  Return to ER for new or worsening conditions, any additional concerns.

## 2016-01-15 LAB — GC/CHLAMYDIA PROBE AMP (~~LOC~~) NOT AT ARMC
Chlamydia: NEGATIVE
Neisseria Gonorrhea: NEGATIVE

## 2016-01-24 ENCOUNTER — Encounter (HOSPITAL_COMMUNITY): Payer: Self-pay | Admitting: Emergency Medicine

## 2016-01-24 ENCOUNTER — Emergency Department (HOSPITAL_COMMUNITY)
Admission: EM | Admit: 2016-01-24 | Discharge: 2016-01-24 | Disposition: A | Discharge status: Home / Self Care | Payer: Medicaid Other | Attending: Emergency Medicine | Admitting: Emergency Medicine

## 2016-01-24 DIAGNOSIS — I10 Essential (primary) hypertension: Secondary | ICD-10-CM | POA: Insufficient documentation

## 2016-01-24 DIAGNOSIS — R1012 Left upper quadrant pain: Secondary | ICD-10-CM

## 2016-01-24 DIAGNOSIS — E119 Type 2 diabetes mellitus without complications: Secondary | ICD-10-CM | POA: Insufficient documentation

## 2016-01-24 DIAGNOSIS — K219 Gastro-esophageal reflux disease without esophagitis: Secondary | ICD-10-CM

## 2016-01-24 DIAGNOSIS — Z79899 Other long term (current) drug therapy: Secondary | ICD-10-CM | POA: Insufficient documentation

## 2016-01-24 MED ORDER — GI COCKTAIL ~~LOC~~
30.0000 mL | Freq: Once | ORAL | Status: AC
  Administered 2016-01-24: 30 mL via ORAL
  Filled 2016-01-24: qty 30

## 2016-01-24 MED ORDER — RANITIDINE HCL 150 MG PO TABS: 150.0000 mg | ORAL_TABLET | Freq: Two times a day (BID) | ORAL | 0 refills | Status: DC

## 2016-01-24 MED ORDER — OMEPRAZOLE 20 MG PO CPDR: 20.0000 mg | DELAYED_RELEASE_CAPSULE | Freq: Every day | ORAL | 0 refills | Status: DC

## 2016-01-24 NOTE — ED Triage Notes (Signed)
Pt reports pain under L breast on chest wall that began at 0545 yesterday am and has not gone away. Denies SOB, lightheadedness, or dizziness, but is having nausea.

## 2016-01-24 NOTE — ED Provider Notes (Signed)
[facility]-EMERGENCY DEPT Provider Note   CSN: [id number] Arrival date & time: 01/24/16  0809     History   Chief Complaint Chief Complaint  Patient presents with  . Chest Pain    HPI [name] [name] is a 30 y.o. female.  The history is provided by the patient.  Chest Pain   This is a new problem. The current episode started yesterday. The problem occurs constantly. Progression since onset: waxing and waning. The pain is associated with movement. The pain is present in the lateral region (left side below breast). The pain is moderate. The quality of the pain is described as sharp. The pain does not radiate. Duration of episode(s) is 1 day. The symptoms are aggravated by certain positions. Associated symptoms include vomiting (x3 yesterday, resolved). Pertinent negatives include no abdominal pain and no back pain. Treatments tried: pepto bismol. The treatment provided no relief. Risk factors include obesity.  Pertinent negatives for past medical history include no DVT, no hypertension and no PE.    Past Medical History:  Diagnosis Date  . Abnormal Pap smear   . Diabetes mellitus    type II  . GBS (group B streptococcus) UTI complicating pregnancy   . Herpes simplex without mention of complication   . Hypertension   . Irregular menses   . Obesity     Patient Active Problem List   Diagnosis Date Noted  . Alopecia areata 09/29/2013  . Type II diabetes mellitus, uncontrolled (HCC) 09/20/2007  . 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 Obesity 07/09/2006  . RHINITIS, ALLERGIC 07/09/2006  . PAPANICOLAOU SMEAR, ABNORMAL 07/09/2006    Past Surgical History:  Procedure Laterality Date  . CESAREAN SECTION  01/30/2011   Procedure: CESAREAN SECTION;  Surgeon: Tilda BurrowJohn V Ferguson, MD;  Location: [facility];  Service: Gynecology;  Laterality: N/A;  . CESAREAN SECTION N/A 10/22/2014   Procedure: CESAREAN SECTION;  Surgeon: Reva Boresanya S Pratt, MD;  Location: [facility];  Service: Obstetrics;   Laterality: N/A;  . WISDOM TOOTH EXTRACTION      OB History    Gravida Para Term Preterm AB Living   4 2 1 1 2 2    SAB TAB Ectopic Multiple Live Births   2     0 2       Home Medications    Prior to Admission medications   Medication Sig Start Date End Date Taking? Authorizing Provider  ACCU-CHEK FASTCLIX LANCETS MISC Check sugar up to 6 x daily 08/02/13   Tommie SamsJayce G Cook, DO  acetaminophen (TYLENOL) 500 MG tablet Take 1,000 mg by mouth every 6 (six) hours as needed for mild pain.    Historical Provider, MD  fluconazole (DIFLUCAN) 150 MG tablet Take 1 tablet (150mg  total) by mouth once in 3 days if symptoms persist. 01/14/16   Chase PicketJaime Pilcher Ward, PA-C  glimepiride (AMARYL) 4 MG tablet Take 2 tablets (8 mg total) by mouth daily before breakfast. 10/01/15   Erasmo DownerAngela M Bacigalupo, MD    Family History Family History  Problem Relation Age of Onset  . Diabetes Paternal Grandmother   . Hypertension Paternal Grandmother   . Diabetes Maternal Grandmother   . Diabetes Father   . Cancer Paternal Uncle     Social History Social History  Substance Use Topics  . Smoking status: Never Smoker  . Smokeless tobacco: Never Used  . Alcohol use No     Allergies   Review of patient's allergies indicates no known allergies.   Review of  Systems Review of Systems  Cardiovascular: Positive for chest pain.  Gastrointestinal: Positive for vomiting (x3 yesterday, resolved). Negative for abdominal pain.  Musculoskeletal: Negative for back pain.  All other systems reviewed and are negative.    Physical Exam Updated Vital Signs BP 124/78 (BP Location: Right Arm)   Pulse 89   Temp 98.4 F (36.9 C) (Oral)   Resp 17   LMP 12/20/2015   SpO2 95%   Physical Exam  Constitutional: She is oriented to person, place, and time. She appears well-developed and well-nourished. No distress.  HENT:  Head: Normocephalic.  Eyes: Conjunctivae are normal.  Neck: Neck supple. No tracheal deviation present.    Cardiovascular: Normal rate, regular rhythm and normal heart sounds.   Pulmonary/Chest: Effort normal and breath sounds normal. No respiratory distress. She exhibits no tenderness.  Abdominal: Soft. She exhibits no distension. There is tenderness (mild LUQ). There is no rebound and no guarding.  Neurological: She is alert and oriented to person, place, and time.  Skin: Skin is warm and dry.  Psychiatric: She has a normal mood and affect.  Vitals reviewed.    ED Treatments / Results  Labs (all labs ordered are listed, but only abnormal results are displayed) Labs Reviewed - No data to display  EKG  EKG Interpretation  Date/Time:  Thursday January 24 2016 08:28:58 EDT Ventricular Rate:  85 PR Interval:    QRS Duration: 76 QT Interval:  380 QTC Calculation: 452 R Axis:   24 Text Interpretation:  Sinus rhythm Borderline T wave abnormalities Since last tracing rate slower Otherwise no significant change Confirmed by Avantae Bither MD, Randeep Biondolillo ([id number]) on 01/24/2016 9:24:22 AM       Radiology No results found.  Procedures Procedures (including critical care time)  Medications Ordered in ED Medications  gi cocktail (Maalox,Lidocaine,Donnatal) (30 mLs Oral Given 01/24/16 0937)     Initial Impression / Assessment and Plan / ED Course  I have reviewed the triage vital signs and the nursing notes.  Pertinent labs & imaging results that were available during my care of the patient were reviewed by me and considered in my medical decision making (see chart for details).  Clinical Course    30 y.o. female presents with What she describes as left lower chest pain beneath her breasts but is more consistent with a left upper quadrant abdominal pain. Made worse by eating fried foods and had a few episodes of vomiting yesterday. Highly atypical for ACS with no risk factors. Unremarkable EKG. No risk factors for PE with Wells of 0. Mildly tender to left upper quadrant of abdomen, symptoms are  completely subsided with GI cocktail. I suspect GERD and have prescribed PPI and H2 blockers. Patient needs to establish primary care in the area and was provided contact information to do so.   Final Clinical Impressions(s) / ED Diagnoses   Final diagnoses:  LUQ abdominal pain  Gastroesophageal reflux disease, esophagitis presence not specified    New Prescriptions New Prescriptions   OMEPRAZOLE (PRILOSEC) 20 MG CAPSULE    Take 1 capsule (20 mg total) by mouth daily.   RANITIDINE (ZANTAC) 150 MG TABLET    Take 1 tablet (150 mg total) by mouth 2 (two) times daily.     Lyndal Pulley, MD 01/24/16 1102

## 2016-02-11 ENCOUNTER — Ambulatory Visit (INDEPENDENT_AMBULATORY_CARE_PROVIDER_SITE_OTHER): Payer: Medicaid Other | Admitting: Internal Medicine

## 2016-02-11 VITALS — BP 137/90 | HR 101 | Temp 98.3°F | Ht 62.0 in | Wt 218.6 lb

## 2016-02-11 DIAGNOSIS — E1165 Type 2 diabetes mellitus with hyperglycemia: Secondary | ICD-10-CM

## 2016-02-11 DIAGNOSIS — IMO0001 Reserved for inherently not codable concepts without codable children: Secondary | ICD-10-CM

## 2016-02-11 DIAGNOSIS — B379 Candidiasis, unspecified: Secondary | ICD-10-CM | POA: Insufficient documentation

## 2016-02-11 LAB — MICROALBUMIN / CREATININE URINE RATIO
CREATININE, URINE: 175 mg/dL (ref 20–320)
Microalb Creat Ratio: 5 mcg/mg creat (ref <30)
Microalb, Ur: 0.8 mg/dL

## 2016-02-11 LAB — POCT GLYCOSYLATED HEMOGLOBIN (HGB A1C): HEMOGLOBIN A1C: 11.5

## 2016-02-11 MED ORDER — SITAGLIPTIN PHOSPHATE 100 MG PO TABS: 100.0000 mg | ORAL_TABLET | Freq: Every day | ORAL | 3 refills | Status: DC

## 2016-02-11 MED ORDER — FLUCONAZOLE 150 MG PO TABS: ORAL_TABLET | ORAL | 0 refills | Status: DC

## 2016-02-11 MED ORDER — GLIMEPIRIDE 4 MG PO TABS: 8.0000 mg | ORAL_TABLET | Freq: Every day | ORAL | 3 refills | Status: DC

## 2016-02-11 NOTE — Assessment & Plan Note (Signed)
Yeast infection, recently negative G/C less than a month ago with previous infection. Patient declined vaginal exam today. Patient recent hx of yeast infections likely due to uncontrolled diabetes.  - Will provide patient with diflucan  - If no improvement follow for further work up

## 2016-02-11 NOTE — Progress Notes (Signed)
   [facility] Noralee CharsAsiyah Mikell, MD Phone: [phone]  Reason For Visit:  Yeast infection and T2DM   Hx of previous yeast infection. Currently has same symptoms of previous yeast infections. Recent seen in the ED on 01/14/2016.  Patient with  4 days of itching. White curdy discharge. Similar yeast infections. No suprapubic pain. No nausea/vomiting, fever or or chills. Hx of tubal ligation, does not use condoms. Only 1 long term partner, no concern for STDs.  DIABETES Issues: Has not taken any medication in months. Barrier: stopped getting Medicaid. Patient now with orange card. Patient states she has been on insulin in the past with both of her pregnancies. However this was stopped and patient was placed on Jaunvia and Amaryl   Disease Monitoring: Has been checking blood sugars 3 times daily  Blood Sugar ranges-  Morning: Avg 200 Max 300 Min 120 Polyuria/phagia/dipsia-polyuria and polydipsia    Visual problems-None, has not seen an eye doctor in the last couple of years   Medications: Patient has been taking metformin 500 mg once daily. Patient was prescribed Januvia but she didn't have the money for it. Patient did indicate taking glimperide for about 1 month.  Compliance- Not compliant due to insurance and finances  Hypoglycemic symptoms- None  Past Medical History Reviewed problem list.  Medications- reviewed and updated No additions to family history  Objective: BP 137/90 (BP Location: Left Arm, Patient Position: Sitting, Cuff Size: Large)   Pulse (!) 101   Temp 98.3 F (36.8 C) (Oral)   Ht 5\' 2"  (1.575 m)   Wt 218 lb 9.6 oz (99.2 kg)   LMP 01/20/2016 (Exact Date)   SpO2 100%   BMI 39.98 kg/m  Gen: NAD, alert, cooperative with exam GI: soft, non-tender, non-distended, bowel sounds present, no hepatomegaly, no splenomegaly Skin: dry, intact, no rashes or lesions Neuro: Strength and sensation grossly intact  Diabetic Foot Exam - Simple   Simple Foot  Form Diabetic Foot exam was performed with the following findings:  Yes 02/11/2016  9:00 AM  Visual Inspection No deformities, no ulcerations, no other skin breakdown bilaterally:  Yes Sensation Testing Intact to touch and monofilament testing bilaterally:  Yes Pulse Check Posterior Tibialis and Dorsalis pulse intact bilaterally:  Yes Comments    Assessment/Plan: See problem based a/p  Yeast infection Yeast infection, recently negative G/C less than a month ago with previous infection. Patient declined vaginal exam today. Patient recent hx of yeast infections likely due to uncontrolled diabetes.  - Will provide patient with diflucan  - If no improvement follow for further work up   Type II diabetes mellitus, uncontrolled Uncontrolled T2DM, A1C today 11.5. Patient will likely need to start insulin. Did not start today as A1C was not obtained prior to visit - Microalbuminuria obtained  - Foot exam done  - Restarted patient on Januvia and glimepiride, though will likely need insulin  - Patient to follow up in 3 weeks with PCP.

## 2016-02-11 NOTE — Patient Instructions (Signed)
Diabetes Your diabetes is not controlled, last A1C 9.3 Your goal is to have an A1c < 8 Medicine Changes: Restarting Glimepiride and Januvia - You should be able to get these from the health department pharmacy  Homework: Please continue check blood sugars, please let us know if you have any blood sugar below 80  Come back to see us in: 3 weeks  I will also prescribe you diflucan for your yeast infection.

## 2016-02-11 NOTE — Assessment & Plan Note (Signed)
Uncontrolled T2DM, A1C today 11.5. Patient will likely need to start insulin. Did not start today as A1C was not obtained prior to visit - Microalbuminuria obtained  - Foot exam done  - Restarted patient on Januvia and glimepiride, though will likely need insulin  - Patient to follow up in 3 weeks with PCP.

## 2016-02-13 ENCOUNTER — Other Ambulatory Visit: Payer: Self-pay | Admitting: *Deleted

## 2016-02-13 NOTE — Telephone Encounter (Signed)
Patient called stating that the [place] [facility] pharmacy did not receive her refills for Januvia and Amaryl.  Medications refills were left on pharmacy voicemail.  Clovis PuMartin, Tamika L, RN

## 2016-02-14 NOTE — Telephone Encounter (Signed)
Yes, Refills called in to patient's pharmacy.  Just a FYI.  Clovis PuMartin, Tamika L, RN

## 2016-03-04 ENCOUNTER — Encounter: Payer: Self-pay | Admitting: Family Medicine

## 2016-03-04 ENCOUNTER — Ambulatory Visit (INDEPENDENT_AMBULATORY_CARE_PROVIDER_SITE_OTHER): Payer: Self-pay | Admitting: Family Medicine

## 2016-03-04 VITALS — BP 125/83 | HR 89 | Temp 98.4°F | Ht 62.0 in | Wt 218.4 lb

## 2016-03-04 DIAGNOSIS — Z23 Encounter for immunization: Secondary | ICD-10-CM

## 2016-03-04 DIAGNOSIS — E1165 Type 2 diabetes mellitus with hyperglycemia: Secondary | ICD-10-CM

## 2016-03-04 DIAGNOSIS — IMO0001 Reserved for inherently not codable concepts without codable children: Secondary | ICD-10-CM

## 2016-03-04 DIAGNOSIS — B379 Candidiasis, unspecified: Secondary | ICD-10-CM

## 2016-03-04 DIAGNOSIS — K5900 Constipation, unspecified: Secondary | ICD-10-CM

## 2016-03-04 MED ORDER — METFORMIN HCL 1000 MG PO TABS: 1000.0000 mg | ORAL_TABLET | Freq: Two times a day (BID) | ORAL | 3 refills | Status: DC

## 2016-03-04 MED ORDER — GLIPIZIDE 5 MG PO TABS: 5.0000 mg | ORAL_TABLET | Freq: Every day | ORAL | 3 refills | Status: DC

## 2016-03-04 MED ORDER — SITAGLIPTIN PHOSPHATE 100 MG PO TABS: 100.0000 mg | ORAL_TABLET | Freq: Every day | ORAL | 3 refills | Status: DC

## 2016-03-04 MED ORDER — FLUCONAZOLE 150 MG PO TABS: ORAL_TABLET | ORAL | 1 refills | Status: DC

## 2016-03-04 NOTE — Patient Instructions (Addendum)
Nice to see you again today. We are starting 2 medications today for your blood sugar. These are available through the health department at a decreased cost. We will also send Januvia to the medication assistance program. You will have to go over there to do the application process and bring any tax information.  I also giving you information about getting enrolled in an affordable cataract insurance program if eligible.  I would like to see you back in 2-3 weeks so that we can determine how this medication is doing and increase as indicated.  Start 1 capful of miralax in a glass of water daily and titrate to one soft bowel movement daily  Take care, Dr. BLeonard Schwartz

## 2016-03-04 NOTE — Progress Notes (Signed)
   Subjective:   [name] [name] is a 30 y.o. female with a history of T2DM, HSV2 here for diabetes f/u  T2DM - Checking BG at home: no - out of test strips - Medications: was previously prescribed januvia, glyburide - not available via health dept  - Compliance: not able to get meds due to cost - Diet: sometimes eats fried foods or drinks soda, otherwise trying to eat low carb - Exercise: walk daily up a hill - eye exam: needs - foot exam: UTD - microalbumin: UTD - denies symptoms of hypoglycemia, polyuria, polydipsia, numbness extremities, foot ulcers/trauma - white thick vaginal discharge, itching - previously helped by diflucan  Constipation - eats fruit and fiber - only having BM 1x/wk - straining to use bathroom - was previously a problem during pregnancy   Review of Systems:  Per HPI.   Social History: never smoker  Objective:  BP 125/83   Pulse 89   Temp 98.4 F (36.9 C) (Oral)   Ht 5\' 2"  (1.575 m)   Wt 218 lb 6.4 oz (99.1 kg)   LMP 02/17/2016   BMI 39.95 kg/m   Gen:  30 y.o. female in NAD  HEENT: NCAT, MMM, EOMI, PERRL, anicteric sclerae CV: RRR, no MRG Resp: Non-labored, CTAB, no wheezes noted Ext: WWP, no edema MSK: No obvious deformities, gait intact Neuro: Alert and oriented, speech normal      Chemistry      Component Value Date/Time   NA 134 (L) 10/21/2014 1025   K 3.9 10/21/2014 1025   CL 108 10/21/2014 1025   CO2 18 (L) 10/21/2014 1025   BUN 7 10/21/2014 1025   CREATININE 0.77 10/21/2014 1025   CREATININE 0.53 05/15/2014 1455      Component Value Date/Time   CALCIUM 9.1 10/21/2014 1025   ALKPHOS 172 (H) 10/21/2014 1025   AST 16 10/21/2014 1025   ALT 11 (L) 10/21/2014 1025   BILITOT 0.5 10/21/2014 1025      Lab Results  Component Value Date   WBC 21.0 (H) 10/23/2014   HGB 11.2 (L) 10/23/2014   HCT 33.1 (L) 10/23/2014   MCV 78.6 10/23/2014   PLT 263 10/23/2014   Lab Results  Component Value Date   TSH 0.431 05/01/2014   Lab  Results  Component Value Date   HGBA1C 11.5 02/11/2016   Assessment & Plan:     [name] [name] is a 30 y.o. female here for   Type II diabetes mellitus, uncontrolled Uncontrolled, last A1c 11.5 Unable to get meds previously due to financial constraints Start Metformin 1000mg  BID and glipizide 5mg  daily ([facility]  Needs optho when has insurance F/u in 2 wks with plan to increase glipizide dose Prevnar and flu shot today  Yeast infection Likely related to hyperglycemia Diflucan x1  Constipation Titrate miralax to 1 soft BM daily    Information given about ACA enrollment   Erasmo DownerAngela M Bacigalupo, MD MPH PGY-3,  [facility] 03/04/2016  10:42 AM

## 2016-03-04 NOTE — Assessment & Plan Note (Signed)
Likely related to hyperglycemia Diflucan x1

## 2016-03-04 NOTE — Assessment & Plan Note (Signed)
Titrate miralax to 1 soft BM daily

## 2016-03-04 NOTE — Assessment & Plan Note (Signed)
Uncontrolled, last A1c 11.5 Unable to get meds previously due to financial constraints Start Metformin 1000mg  BID and glipizide 5mg  daily ([facility]ia Rx to [facility]  Needs optho when has insurance F/u in 2 wks with plan to increase glipizide dose Prevnar and flu shot today

## 2016-03-06 ENCOUNTER — Telehealth: Payer: Self-pay | Admitting: Family Medicine

## 2016-03-06 NOTE — Telephone Encounter (Signed)
Patient [facility] P[facility] because they haven't received any information about them. Please, follow up ASAP.

## 2016-03-07 MED ORDER — GLIPIZIDE 5 MG PO TABS: 5.0000 mg | ORAL_TABLET | Freq: Every day | ORAL | 3 refills | Status: DC

## 2016-03-07 MED ORDER — METFORMIN HCL 1000 MG PO TABS: 1000.0000 mg | ORAL_TABLET | Freq: Two times a day (BID) | ORAL | 3 refills | Status: DC

## 2016-03-07 NOTE — Telephone Encounter (Signed)
[facility]y [facility].  Please let patient know.  Erasmo DownerAngela M Arshawn Valdez, MD, MPH PGY-3,  [facility] 03/07/2016 10:29 AM

## 2016-03-10 NOTE — Telephone Encounter (Signed)
Left message for patient last week that rx's were faxed.

## 2016-03-13 ENCOUNTER — Telehealth: Payer: Self-pay | Admitting: Family Medicine

## 2016-03-13 DIAGNOSIS — B379 Candidiasis, unspecified: Secondary | ICD-10-CM

## 2016-03-13 DIAGNOSIS — E1165 Type 2 diabetes mellitus with hyperglycemia: Principal | ICD-10-CM

## 2016-03-13 DIAGNOSIS — IMO0001 Reserved for inherently not codable concepts without codable children: Secondary | ICD-10-CM

## 2016-03-13 MED ORDER — METFORMIN HCL 1000 MG PO TABS: 1000.0000 mg | ORAL_TABLET | Freq: Two times a day (BID) | ORAL | 3 refills | Status: DC

## 2016-03-13 MED ORDER — GLIPIZIDE 5 MG PO TABS: 5.0000 mg | ORAL_TABLET | Freq: Every day | ORAL | 3 refills | Status: DC

## 2016-03-13 MED ORDER — SITAGLIPTIN PHOSPHATE 100 MG PO TABS: 100.0000 mg | ORAL_TABLET | Freq: Every day | ORAL | 3 refills | Status: DC

## 2016-03-13 MED ORDER — FLUCONAZOLE 150 MG PO TABS: ORAL_TABLET | ORAL | 1 refills | Status: DC

## 2016-03-13 NOTE — Telephone Encounter (Signed)
Faxed to number below. Deseree Bruna PotterBlount, CMA

## 2016-03-13 NOTE — Telephone Encounter (Signed)
Rx printed and faxed.  [name] DownerAngela M Bailley [place], MD, MPH PGY-3,  [facility] 03/13/2016 4:38 PM

## 2016-03-13 NOTE — Telephone Encounter (Signed)
Pharmacy ([facility]) called and would like us to refax all the patients prescriptions again since the old fax machine doesn't work anymore and they are using the [phone] fax number. Please fax over the Diflucan, Januvia, Glipizide, and Metformin . jw

## 2016-03-19 ENCOUNTER — Ambulatory Visit: Payer: Medicaid Other | Admitting: Family Medicine

## 2016-03-25 ENCOUNTER — Telehealth: Payer: Self-pay | Admitting: Family Medicine

## 2016-03-25 ENCOUNTER — Encounter (HOSPITAL_COMMUNITY): Payer: Self-pay

## 2016-03-25 ENCOUNTER — Emergency Department (HOSPITAL_COMMUNITY)
Admission: EM | Admit: 2016-03-25 | Discharge: 2016-03-25 | Disposition: A | Discharge status: Home / Self Care | Payer: Self-pay | Attending: Emergency Medicine | Admitting: Emergency Medicine

## 2016-03-25 DIAGNOSIS — E1165 Type 2 diabetes mellitus with hyperglycemia: Secondary | ICD-10-CM | POA: Insufficient documentation

## 2016-03-25 DIAGNOSIS — Z7984 Long term (current) use of oral hypoglycemic drugs: Secondary | ICD-10-CM | POA: Insufficient documentation

## 2016-03-25 DIAGNOSIS — B3731 Acute candidiasis of vulva and vagina: Secondary | ICD-10-CM

## 2016-03-25 DIAGNOSIS — I10 Essential (primary) hypertension: Secondary | ICD-10-CM | POA: Insufficient documentation

## 2016-03-25 DIAGNOSIS — N760 Acute vaginitis: Secondary | ICD-10-CM | POA: Insufficient documentation

## 2016-03-25 DIAGNOSIS — R739 Hyperglycemia, unspecified: Secondary | ICD-10-CM

## 2016-03-25 DIAGNOSIS — B9689 Other specified bacterial agents as the cause of diseases classified elsewhere: Secondary | ICD-10-CM

## 2016-03-25 DIAGNOSIS — B373 Candidiasis of vulva and vagina: Secondary | ICD-10-CM | POA: Insufficient documentation

## 2016-03-25 DIAGNOSIS — IMO0001 Reserved for inherently not codable concepts without codable children: Secondary | ICD-10-CM

## 2016-03-25 LAB — URINALYSIS, ROUTINE W REFLEX MICROSCOPIC
BILIRUBIN URINE: NEGATIVE
Glucose, UA: NEGATIVE mg/dL
HGB URINE DIPSTICK: NEGATIVE
KETONES UR: NEGATIVE mg/dL
Nitrite: NEGATIVE
PROTEIN: NEGATIVE mg/dL
Specific Gravity, Urine: 1.016 (ref 1.005–1.030)
pH: 8.5 — ABNORMAL HIGH (ref 5.0–8.0)

## 2016-03-25 LAB — WET PREP, GENITAL
Sperm: NONE SEEN
Trich, Wet Prep: NONE SEEN

## 2016-03-25 LAB — CBG MONITORING, ED: Glucose-Capillary: 165 mg/dL — ABNORMAL HIGH (ref 65–99)

## 2016-03-25 LAB — URINE MICROSCOPIC-ADD ON

## 2016-03-25 LAB — PREGNANCY, URINE: PREG TEST UR: NEGATIVE

## 2016-03-25 MED ORDER — FLUCONAZOLE 150 MG PO TABS: ORAL_TABLET | ORAL | 1 refills | Status: DC

## 2016-03-25 MED ORDER — METRONIDAZOLE 500 MG PO TABS
500.0000 mg | ORAL_TABLET | Freq: Once | ORAL | Status: AC
  Administered 2016-03-25: 500 mg via ORAL
  Filled 2016-03-25: qty 1

## 2016-03-25 MED ORDER — FLUCONAZOLE 100 MG PO TABS
150.0000 mg | ORAL_TABLET | Freq: Once | ORAL | Status: AC
  Administered 2016-03-25: 150 mg via ORAL
  Filled 2016-03-25: qty 2

## 2016-03-25 MED ORDER — MICONAZOLE NITRATE 2 % EX CREA: 1.0000 "application " | TOPICAL_CREAM | Freq: Two times a day (BID) | CUTANEOUS | 0 refills | Status: DC

## 2016-03-25 MED ORDER — GLIPIZIDE 5 MG PO TABS: 5.0000 mg | ORAL_TABLET | Freq: Every day | ORAL | 0 refills | Status: DC

## 2016-03-25 MED ORDER — METRONIDAZOLE 500 MG PO TABS: 500.0000 mg | ORAL_TABLET | Freq: Two times a day (BID) | ORAL | 0 refills | Status: DC

## 2016-03-25 MED ORDER — SITAGLIPTIN PHOSPHATE 100 MG PO TABS: 100.0000 mg | ORAL_TABLET | Freq: Every day | ORAL | 0 refills | Status: DC

## 2016-03-25 MED ORDER — METFORMIN HCL 1000 MG PO TABS: 1000.0000 mg | ORAL_TABLET | Freq: Two times a day (BID) | ORAL | 0 refills | Status: DC

## 2016-03-25 NOTE — ED Notes (Signed)
Pelvic exam done by Dr. Haviland and Svara Twyman - EMT assisted. °

## 2016-03-25 NOTE — Addendum Note (Signed)
Addended by: Clovis PuMARTIN, TAMIKA L on: 03/25/2016 04:26 PM   Modules accepted: Orders

## 2016-03-25 NOTE — ED Triage Notes (Signed)
Onset 3 weeks thick white discharge, no odor.  Vaginal area is irritated.

## 2016-03-25 NOTE — ED Notes (Signed)
Pt verbalized understanding discharge instructions and denies any further needs or questions at this time. VS stable, ambulatory and steady gait.   

## 2016-03-25 NOTE — Telephone Encounter (Signed)
[facility]  All others [facility] into [facility].  Clovis PuMartin, Nocholas Damaso L, RN

## 2016-03-25 NOTE — ED Provider Notes (Signed)
[facility] Provider Note   CSN: [id number] Arrival date & time: 03/25/16  1742     History   Chief Complaint Chief Complaint  Patient presents with  . Vaginal Discharge    HPI [name] [name] is a 30 y.o. female.  Pt presents to the ED today with vaginal discharge.  Pt said that she was diagnosed with a vaginal yeast infection by her pcp 3 weeks ago, but has not received the medication yet.  The pt also said that she is low on her diabetic medications, and has only been taking half the doses.       Past Medical History:  Diagnosis Date  . Abnormal Pap smear   . Diabetes mellitus    type II  . GBS (group B streptococcus) UTI complicating pregnancy   . Herpes simplex without mention of complication   . Hypertension   . Irregular menses   . Obesity     Patient Active Problem List   Diagnosis Date Noted  . Constipation 03/04/2016  . Yeast infection 02/11/2016  . Alopecia areata 09/29/2013  . Type II diabetes mellitus, uncontrolled (HCC) 09/20/2007  . 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 Obesity 07/09/2006  . RHINITIS, ALLERGIC 07/09/2006  . PAPANICOLAOU SMEAR, ABNORMAL 07/09/2006    Past Surgical History:  Procedure Laterality Date  . CESAREAN SECTION  01/30/2011   Procedure: CESAREAN SECTION;  Surgeon: Tilda Burrow, MD;  Location: [facility];  Service: Gynecology;  Laterality: N/A;  . CESAREAN SECTION N/A 10/22/2014   Procedure: CESAREAN SECTION;  Surgeon: Reva Bores, MD;  Location: [facility];  Service: Obstetrics;  Laterality: N/A;  . WISDOM TOOTH EXTRACTION      OB History    Gravida Para Term Preterm AB Living   4 2 1 1 2 2    SAB TAB Ectopic Multiple Live Births   2     0 2       Home Medications    Prior to Admission medications   Medication Sig Start Date End Date Taking? Authorizing Provider  acetaminophen (TYLENOL) 500 MG tablet Take 1,000 mg by mouth every 6 (six) hours as needed for mild pain.   Yes Historical Provider,  MD  ACCU-CHEK FASTCLIX LANCETS MISC Check sugar up to 6 x daily Patient not taking: Reported on 03/25/2016 08/02/13   Tommie Sams, DO  fluconazole (DIFLUCAN) 150 MG tablet Take 1 tablet (150mg  total) by mouth once in 3 days if symptoms persist. Patient not taking: Reported on 03/25/2016 03/25/16   Erasmo Downer, MD  glipiZIDE (GLUCOTROL) 5 MG tablet Take 1 tablet (5 mg total) by mouth daily before breakfast. 03/25/16   Jacalyn Lefevre, MD  metFORMIN (GLUCOPHAGE) 1000 MG tablet Take 1 tablet (1,000 mg total) by mouth 2 (two) times daily with a meal. 03/25/16   Jacalyn Lefevre, MD  metroNIDAZOLE (FLAGYL) 500 MG tablet Take 1 tablet (500 mg total) by mouth 2 (two) times daily. 03/25/16   Jacalyn Lefevre, MD  miconazole (MICOTIN) 2 % cream Apply 1 application topically 2 (two) times daily. 03/25/16   Jacalyn Lefevre, MD  sitaGLIPtin (JANUVIA) 100 MG tablet Take 1 tablet (100 mg total) by mouth daily. 03/25/16   Jacalyn Lefevre, MD    Family History Family History  Problem Relation Age of Onset  . Diabetes Paternal Grandmother   . Hypertension Paternal Grandmother   . Diabetes Maternal Grandmother   . Diabetes Father   . Cancer Paternal Uncle  Social History Social History  Substance Use Topics  . Smoking status: Never Smoker  . Smokeless tobacco: Never Used  . Alcohol use No     Allergies   Patient has no known allergies.   Review of Systems Review of Systems  Genitourinary: Positive for vaginal discharge.  All other systems reviewed and are negative.    Physical Exam Updated Vital Signs BP 130/83   Pulse 98   Temp 98.4 F (36.9 C) (Oral)   Resp 16   Ht 5\' 1"  (1.549 m)   Wt 208 lb (94.3 kg)   LMP 02/17/2016   SpO2 100%   BMI 39.30 kg/m   Physical Exam  Constitutional: She appears well-developed and well-nourished.  HENT:  Head: Normocephalic and atraumatic.  Right Ear: External ear normal.  Left Ear: External ear normal.  Nose: Nose normal.    Mouth/Throat: Oropharynx is clear and moist.  Eyes: Conjunctivae and EOM are normal. Pupils are equal, round, and reactive to light.  Neck: Normal range of motion. Neck supple.  Cardiovascular: Normal rate, regular rhythm, normal heart sounds and intact distal pulses.   Pulmonary/Chest: Effort normal and breath sounds normal.  Abdominal: Soft. Bowel sounds are normal.  Genitourinary: Uterus normal. There is rash on the right labia. There is rash on the left labia. Cervix exhibits discharge. Right adnexum displays no mass, no tenderness and no fullness. Left adnexum displays no mass, no tenderness and no fullness. Vaginal discharge found.  Musculoskeletal: Normal range of motion.  Lymphadenopathy:       Right: No inguinal adenopathy present.       Left: No inguinal adenopathy present.  Neurological: She is alert.  Skin: Skin is warm.  Psychiatric: She has a normal mood and affect. Her behavior is normal. Judgment and thought content normal.  Nursing note and vitals reviewed.    ED Treatments / Results  Labs (all labs ordered are listed, but only abnormal results are displayed) Labs Reviewed  WET PREP, GENITAL - Abnormal; Notable for the following:       Result Value   Yeast Wet Prep HPF POC PRESENT (*)    Clue Cells Wet Prep HPF POC PRESENT (*)    WBC, Wet Prep HPF POC MANY (*)    All other components within normal limits  CBG MONITORING, ED - Abnormal; Notable for the following:    Glucose-Capillary 165 (*)    All other components within normal limits  PREGNANCY, URINE  URINALYSIS, ROUTINE W REFLEX MICROSCOPIC (NOT AT [facility])  GC/CHLAMYDIA PROBE AMP ([place]) NOT AT [facility]    EKG  EKG Interpretation None       Radiology No results found.  Procedures Procedures (including critical care time)  Medications Ordered in ED Medications  fluconazole (DIFLUCAN) tablet 150 mg (not administered)  metroNIDAZOLE (FLAGYL) tablet 500 mg (not administered)     Initial  Impression / Assessment and Plan / ED Course  I have reviewed the triage vital signs and the nursing notes.  Pertinent labs & imaging results that were available during my care of the patient were reviewed by me and considered in my medical decision making (see chart for details).  Clinical Course     Pt given diflucan here.  She is given a rx for flagyl and monistat.  She is also give a refill rx of her medications.  Final Clinical Impressions(s) / ED Diagnoses   Final diagnoses:  BV (bacterial vaginosis)  Vaginal yeast infection  Hyperglycemia    New  Prescriptions New Prescriptions   METRONIDAZOLE (FLAGYL) 500 MG TABLET    Take 1 tablet (500 mg total) by mouth 2 (two) times daily.   MICONAZOLE (MICOTIN) 2 % CREAM    Apply 1 application topically 2 (two) times daily.     Jacalyn LefevreJulie Zach Tietje, MD 03/25/16 [phone]

## 2016-03-25 NOTE — ED Notes (Signed)
Pt's CBG result was 165. Informed Kim - RN and Dr. Particia NearingHaviland.

## 2016-03-25 NOTE — Telephone Encounter (Signed)
Health Department still does not have these Rx's. [facility] [address]. Please advise. Thanks! ep

## 2016-03-27 LAB — GC/CHLAMYDIA PROBE AMP (~~LOC~~) NOT AT ARMC
Chlamydia: NEGATIVE
Neisseria Gonorrhea: NEGATIVE

## 2016-04-02 ENCOUNTER — Ambulatory Visit: Payer: Self-pay | Admitting: Family Medicine

## 2016-05-16 ENCOUNTER — Ambulatory Visit (INDEPENDENT_AMBULATORY_CARE_PROVIDER_SITE_OTHER): Payer: Self-pay | Admitting: Family Medicine

## 2016-05-16 ENCOUNTER — Telehealth: Payer: Self-pay | Admitting: Family Medicine

## 2016-05-16 ENCOUNTER — Encounter: Payer: Self-pay | Admitting: Family Medicine

## 2016-05-16 VITALS — BP 130/88 | HR 100 | Temp 98.1°F | Ht 61.0 in | Wt 217.0 lb

## 2016-05-16 DIAGNOSIS — R300 Dysuria: Secondary | ICD-10-CM

## 2016-05-16 DIAGNOSIS — N898 Other specified noninflammatory disorders of vagina: Secondary | ICD-10-CM

## 2016-05-16 DIAGNOSIS — B379 Candidiasis, unspecified: Secondary | ICD-10-CM

## 2016-05-16 LAB — POCT URINALYSIS DIPSTICK
BILIRUBIN UA: NEGATIVE
Blood, UA: NEGATIVE
GLUCOSE UA: NEGATIVE
KETONES UA: 40
Nitrite, UA: NEGATIVE
PROTEIN UA: NEGATIVE
SPEC GRAV UA: 1.025
Urobilinogen, UA: 0.2
pH, UA: 6

## 2016-05-16 LAB — POCT UA - MICROSCOPIC ONLY

## 2016-05-16 LAB — POCT WET PREP (WET MOUNT)
CLUE CELLS WET PREP WHIFF POC: NEGATIVE
TRICHOMONAS WET PREP HPF POC: ABSENT

## 2016-05-16 MED ORDER — FLUCONAZOLE 150 MG PO TABS: ORAL_TABLET | ORAL | 1 refills | Status: DC

## 2016-05-16 NOTE — Patient Instructions (Signed)
It was nice to see you today.  Dr. Samreet Edenfield will call you with your results.  

## 2016-05-16 NOTE — Progress Notes (Signed)
   Subjective:    Patient ID: [name] [name], female    DOB: [birth year], 31 y.o.   MRN: [id number]  HPI 31 y/o female presents for evaluation of "yeast infection".  Vaginal burning/itching for one day, urinary burning as well, previous BV and yeast infection, no odor, no fevers, thick white discharge. One sexual partner, no protection. LMP late December. Previous tubal ligation.    Review of Systems     Objective:   Physical Exam BP 130/88   Pulse 100   Temp 98.1 F (36.7 C) (Oral)   Ht 5\' 1"  (1.549 m)   Wt 217 lb (98.4 kg)   BMI 41.00 kg/m   GU: normal external genitalia, thick white discharge present, normal appearing cervix on speculum exam, cultures obtained, no cervical tenderness, no mass on bimanual exam.        Assessment & Plan:  Vaginal discharge Patient presents for evaluation of vaginal discharge. -cultures obtained and pending -patient declined HIV, RPR, and Hepatitis -check UA given associated urinary symptoms

## 2016-05-16 NOTE — Assessment & Plan Note (Addendum)
Patient presents for evaluation of vaginal discharge. -cultures obtained and pending -patient declined HIV, RPR, and Hepatitis -check UA given associated urinary symptoms

## 2016-05-16 NOTE — Telephone Encounter (Signed)
Pt saw Dr. Randolm IdolFletke this morning. Pt states she knows she has a yeast infection and would like something called into the health department pharmacy. ep

## 2016-05-16 NOTE — Telephone Encounter (Signed)
Notified patient of yeast infection. Faxed prescription for Diflucan to health department.

## 2016-05-20 ENCOUNTER — Encounter: Payer: Self-pay | Admitting: Family Medicine

## 2016-05-20 NOTE — Telephone Encounter (Signed)
Prescription sent in last week. See results telephone note.

## 2016-05-21 LAB — CERVICOVAGINAL ANCILLARY ONLY
CHLAMYDIA, DNA PROBE: NEGATIVE
NEISSERIA GONORRHEA: NEGATIVE

## 2016-06-11 ENCOUNTER — Ambulatory Visit: Payer: Self-pay

## 2016-07-21 ENCOUNTER — Ambulatory Visit: Payer: Self-pay | Admitting: Family Medicine

## 2016-07-22 ENCOUNTER — Ambulatory Visit (INDEPENDENT_AMBULATORY_CARE_PROVIDER_SITE_OTHER): Payer: Self-pay | Admitting: Internal Medicine

## 2016-07-22 ENCOUNTER — Encounter: Payer: Self-pay | Admitting: Internal Medicine

## 2016-07-22 VITALS — BP 124/58 | HR 99 | Temp 98.6°F | Ht 61.0 in | Wt 218.8 lb

## 2016-07-22 DIAGNOSIS — E1165 Type 2 diabetes mellitus with hyperglycemia: Secondary | ICD-10-CM

## 2016-07-22 DIAGNOSIS — IMO0001 Reserved for inherently not codable concepts without codable children: Secondary | ICD-10-CM

## 2016-07-22 DIAGNOSIS — Z760 Encounter for issue of repeat prescription: Secondary | ICD-10-CM | POA: Insufficient documentation

## 2016-07-22 DIAGNOSIS — E119 Type 2 diabetes mellitus without complications: Secondary | ICD-10-CM

## 2016-07-22 MED ORDER — GLIPIZIDE 10 MG PO TABS: 10.0000 mg | ORAL_TABLET | Freq: Every day | ORAL | 2 refills | Status: DC

## 2016-07-22 NOTE — Assessment & Plan Note (Addendum)
-   Poorly controlled with last hgb A1c 11.5 in October of 2017 - Poor medication adherence and does not check sugars - Increase glipizide. Still needs to complete course for januvia, which she has not done yet, so alternative treatments (insulin) likely needed at follow-up - Wants to lose weight and decrease reliance on medications - Wants to defer hgb A1c testing until has appointment about [employer] coverage

## 2016-07-22 NOTE — Progress Notes (Signed)
[facility] Progress Note  Subjective:  [name] [name] is a 31 y.o. female with history of T2DM and obesity who presents for medication refill.  #Medication refill for diabetes: - Patient had been prescribed januvia (100 mg daily), metformin (1000 mg BID), and glipizide (5 mg daily). Says she ran out of medication over a month ago because no longer had [employer]. - [facility] but no appointments until April. Had to reschedule due to weather. 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 Her goal is to exercise at least 3x a week. She is drinking a lot more water and has cut out juice.  ROS: Denies abdominal discomfort, n/v, HA, dizziness; reports increased urinary frequency but says has been drinking a lot more water  No Known Allergies  Objective: Blood pressure (!) 124/58, pulse 99, temperature 98.6 F (37 C), temperature source Oral, height 5\' 1"  (1.549 m), weight 218 lb 12.8 oz (99.2 kg), SpO2 97 %, not currently breastfeeding. Body mass index is 41.34 kg/m. Constitutional: Obese, pleasant female in NAD Cardiovascular: RRR, S1, S2, no m/r/g.  Pulmonary/Chest: Effort normal and breath sounds normal. No respiratory distress.  Abdominal: Soft. +BS, NT, ND Psychiatric: Normal mood and affect.  Vitals reviewed  Assessment/Plan: Medication refill - Patient limited by lack of insurance and [employer] Card running out - Patient to make  appointment to renew [employer] Card in April - Will refill glipizide but increase to 10 mg daily. Patient says she can pay out of pocket for now, as [facility]  Type II diabetes mellitus, uncontrolled - Poorly controlled with last hgb A1c 11.5 in October of 2017 - Poor medication adherence and does not check sugars - Wants to lose weight and decrease reliance on medications - Wants to defer hgb A1c testing until has appointment about [employer] Card coverage  Follow-up as soon as possible for annual exam with labs (hgb A1c) and pap once re-enrolled in [employer] Card.  Dani GobbleHillary Tian Davison, MD [facility], PGY-2

## 2016-07-22 NOTE — Assessment & Plan Note (Addendum)
-   Patient limited by lack of insurance and [employer] Card running out - Patient to make appointment to renew [employer] Card in April - Will refill glipizide but increase to 10 mg daily. Patient says she can pay out of pocket for now, as [facility]

## 2016-07-22 NOTE — Progress Notes (Signed)
med

## 2016-07-22 NOTE — Patient Instructions (Signed)
[name],  Thank you for coming in. I have increased glipizide to 10 mg daily.  Please ask at the front desk for [facility].  Best, Dr. Sampson GoonFitzgerald

## 2016-08-14 ENCOUNTER — Encounter: Payer: Self-pay | Admitting: Family Medicine

## 2016-08-14 ENCOUNTER — Ambulatory Visit (INDEPENDENT_AMBULATORY_CARE_PROVIDER_SITE_OTHER): Payer: Self-pay | Admitting: Family Medicine

## 2016-08-14 VITALS — BP 110/60 | HR 72 | Temp 98.8°F | Ht 61.0 in | Wt 215.0 lb

## 2016-08-14 DIAGNOSIS — E1310 Other specified diabetes mellitus with ketoacidosis without coma: Secondary | ICD-10-CM

## 2016-08-14 DIAGNOSIS — E1110 Type 2 diabetes mellitus with ketoacidosis without coma: Secondary | ICD-10-CM

## 2016-08-14 LAB — POCT GLYCOSYLATED HEMOGLOBIN (HGB A1C): Hemoglobin A1C: 10.8

## 2016-08-14 NOTE — Progress Notes (Signed)
   Subjective:   [name] [name] is a 31 y.o. female with a history of T2 DM, HSV, alopecia here for diabetes follow-up  T2DM - Checking BG at home: no - Medications: glipizide  daily - GI distress with Metformin, unable to afford [place] - waiting for update for orange card - Compliance: good - Diet: protein shakes BID, 1 meal daily, low carb - Exercise: exercise (Health class) - 1hr daily - Working hard on losing weight and problems of 4 pounds she has lost in the last month - eye exam: needs, unable to afford Optho - foot exam: 02/2016 - microalbumin: normal in 02/2016 - denies symptoms of hypoglycemia, polyuria, polydipsia, numbness extremities, foot ulcers/trauma - Very resistant to the idea of insulin and becomes tearful when it is mentioned   Review of Systems:  Per HPI.   Social History: Never smoker  Objective:  BP 110/60 (BP Location: Left Arm, Patient Position: Sitting, Cuff Size: Large)   Pulse 72   Temp 98.8 F (37.1 C) (Oral)   Ht  (1.549 m)   Wt 215 lb (97.5 kg)   SpO2 98%   BMI 40.62 kg/m   Gen:  31 y.o. female in NAD HEENT: NCAT, MMM, anicteric sclerae CV: RRR, no MRG Resp: Non-labored, CTAB, no wheezes noted Ext: WWP, no edema MSK: No obvious deformities, gait intact Neuro: Alert and oriented, speech normal       Chemistry      Component Value Date/Time   NA 134 (L) 10/21/2014 1025   K 3.9 10/21/2014 1025   CL 108 10/21/2014 1025   CO2 18 (L) 10/21/2014 1025   BUN 7 10/21/2014 1025   CREATININE 0.77 10/21/2014 1025   CREATININE 0.53 05/15/2014 1455      Component Value Date/Time   CALCIUM 9.1 10/21/2014 1025   ALKPHOS 172 (H) 10/21/2014 1025   AST 16 10/21/2014 1025   ALT 11 (L) 10/21/2014 1025   BILITOT 0.5 10/21/2014 1025      Lab Results  Component Value Date   WBC 21.0 (H) 10/23/2014   HGB 11.2 (L) 10/23/2014   HCT 33.1 (L) 10/23/2014   MCV 78.6 10/23/2014   PLT 263 10/23/2014   Lab Results  Component Value  Date   TSH 0.431 05/01/2014   Lab Results  Component Value Date   HGBA1C 10.8 08/14/2016   Assessment & Plan:     [name] [name] is a 31 y.o. female here for   Type II diabetes mellitus, uncontrolled Poorly controlled with A1c 10.8 Poor medication adherence and does not check blood glucose Continue glipizide She needs to get the orange card for medication assistance in order to try other medications She is unable to tolerate metformin Encouraged her on her diet and exercise and attempts to lose weight Advised that insulin may be necessary to reduce A1c, patient becomes tearful and will not discuss using insulin   DOT forms regarding diabetes completed today and copy scanned and inputted to chart  Erasmo Downer, MD MPH PGY-3,  [facility] 08/15/2016  11:48 AM

## 2016-08-14 NOTE — Patient Instructions (Signed)
Your A1c is 10.8. It was 11.5 last time it was checked. Your driving information packet was completed today. Continue her glipizide. We talked about starting insulin.  Follow-up after your orange card comes through or within the next 3 months. Continue working on diet and exercise.  Take care, Dr. Leonard Schwartz

## 2016-08-15 NOTE — Assessment & Plan Note (Signed)
Poorly controlled with A1c 10.8 Poor medication adherence and does not check blood glucose Continue glipizide She needs to get the orange card for medication assistance in order to try other medications She is unable to tolerate metformin Encouraged her on her diet and exercise and attempts to lose weight Advised that insulin may be necessary to reduce A1c, patient becomes tearful and will not discuss using insulin

## 2016-09-01 ENCOUNTER — Ambulatory Visit: Payer: Self-pay

## 2016-09-11 ENCOUNTER — Ambulatory Visit: Payer: Self-pay | Admitting: Family Medicine

## 2016-09-29 ENCOUNTER — Ambulatory Visit: Payer: Self-pay | Admitting: Family Medicine

## 2016-09-29 NOTE — Progress Notes (Deleted)
   Subjective:   [name] [name] is a 31 y.o. female with a history of *** here for ***  *** T2DM - Checking BG at home: *** - Medications: glipizide 10mg  daily - Compliance: *** - Diet: *** - Exercise: *** - eye exam: *** - foot exam: *** - microalbumin: *** - denies symptoms of hypoglycemia, polyuria, polydipsia, numbness extremities, foot ulcers/trauma   Review of Systems:  Per HPI.   Social History: *** smoker  Objective:  There were no vitals taken for this visit.  Gen:  31 y.o. female in NAD *** HEENT: NCAT, MMM, EOMI, PERRL, anicteric sclerae CV: RRR, no MRG, no JVD Resp: Non-labored, CTAB, no wheezes noted Abd: Soft, NTND, BS present, no guarding or organomegaly Ext: WWP, no edema MSK: Full ROM, strength intact Neuro: Alert and oriented, speech normal       Chemistry      Component Value Date/Time   NA 134 (L) 10/21/2014 1025   K 3.9 10/21/2014 1025   CL 108 10/21/2014 1025   CO2 18 (L) 10/21/2014 1025   BUN 7 10/21/2014 1025   CREATININE 0.77 10/21/2014 1025   CREATININE 0.53 05/15/2014 1455      Component Value Date/Time   CALCIUM 9.1 10/21/2014 1025   ALKPHOS 172 (H) 10/21/2014 1025   AST 16 10/21/2014 1025   ALT 11 (L) 10/21/2014 1025   BILITOT 0.5 10/21/2014 1025      Lab Results  Component Value Date   WBC 21.0 (H) 10/23/2014   HGB 11.2 (L) 10/23/2014   HCT 33.1 (L) 10/23/2014   MCV 78.6 10/23/2014   PLT 263 10/23/2014   Lab Results  Component Value Date   TSH 0.431 05/01/2014   Lab Results  Component Value Date   HGBA1C 10.8 08/14/2016   Assessment & Plan:     [name] [name] is a 31 y.o. female here for ***  No problem-specific Assessment & Plan notes found for this encounter.     Erasmo DownerBacigalupo, Angela M, MD MPH PGY-3,  [facility] 09/29/2016  9:51 AM

## 2016-11-10 ENCOUNTER — Encounter (HOSPITAL_COMMUNITY): Payer: Self-pay | Admitting: *Deleted

## 2016-11-10 ENCOUNTER — Emergency Department (HOSPITAL_COMMUNITY)
Admission: EM | Admit: 2016-11-10 | Discharge: 2016-11-11 | Disposition: A | Discharge status: Home / Self Care | Payer: Self-pay | Attending: Emergency Medicine | Admitting: Emergency Medicine

## 2016-11-10 DIAGNOSIS — Z79899 Other long term (current) drug therapy: Secondary | ICD-10-CM | POA: Insufficient documentation

## 2016-11-10 DIAGNOSIS — I10 Essential (primary) hypertension: Secondary | ICD-10-CM | POA: Insufficient documentation

## 2016-11-10 DIAGNOSIS — E119 Type 2 diabetes mellitus without complications: Secondary | ICD-10-CM | POA: Insufficient documentation

## 2016-11-10 DIAGNOSIS — N898 Other specified noninflammatory disorders of vagina: Secondary | ICD-10-CM | POA: Insufficient documentation

## 2016-11-10 DIAGNOSIS — Z7984 Long term (current) use of oral hypoglycemic drugs: Secondary | ICD-10-CM | POA: Insufficient documentation

## 2016-11-10 DIAGNOSIS — R21 Rash and other nonspecific skin eruption: Secondary | ICD-10-CM | POA: Insufficient documentation

## 2016-11-10 DIAGNOSIS — B373 Candidiasis of vulva and vagina: Secondary | ICD-10-CM | POA: Insufficient documentation

## 2016-11-10 DIAGNOSIS — B3731 Acute candidiasis of vulva and vagina: Secondary | ICD-10-CM

## 2016-11-10 LAB — URINALYSIS, ROUTINE W REFLEX MICROSCOPIC
Bilirubin Urine: NEGATIVE
Glucose, UA: >=500 mg/dL — AB
Ketones, ur: NEGATIVE mg/dL
NITRITE: NEGATIVE
PH: 7 (ref 5.0–8.0)
Protein, ur: NEGATIVE mg/dL
SPECIFIC GRAVITY, URINE: 1.033 — AB (ref 1.005–1.030)

## 2016-11-10 LAB — POC URINE PREG, ED: PREG TEST UR: NEGATIVE

## 2016-11-10 NOTE — ED Provider Notes (Signed)
[facility]-EM[facility] DEPT Provider Note   CSN: [id number] Arrival date & time: 11/10/16  2148   By signing my name below, I, Clarisse GougeXavier Herndon, attest that this documentation has been prepared under the direction and in the presence of Kerrie BuffaloHope Gearold Wainer, NP. Electronically signed, Clarisse GougeXavier Herndon, ED Scribe. 11/10/16. 12:00 AM.   History   Chief Complaint Chief Complaint  Patient presents with  . Vaginitis   The history is provided by the patient and medical records. No language interpreter was used.    [name] [name] is a 31 y.o. female with h/o DM, yeast infections, irregular menses and HSV presenting to the Emergency Department concerning vaginal itching x 2 days. Associated "thick, white, cheesy discharge". She states her symptoms are consistent with historic yeast infections. She states she has used monistat without relief. No urinary frequency, dysuria or difficulty urinating. No other complaints at this time.   Past Medical History:  Diagnosis Date  . Abnormal Pap smear   . Diabetes mellitus    type II  . GBS (group B streptococcus) UTI complicating pregnancy   . Herpes simplex without mention of complication   . Hypertension   . Irregular menses   . Obesity     Patient Active Problem List   Diagnosis Date Noted  . Medication refill 07/22/2016  . Vaginal discharge 05/16/2016  . Constipation 03/04/2016  . Yeast infection 02/11/2016  . Alopecia areata 09/29/2013  . Type II diabetes mellitus, uncontrolled (HCC) 09/20/2007  . 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 Obesity 07/09/2006  . RHINITIS, ALLERGIC 07/09/2006  . PAPANICOLAOU SMEAR, ABNORMAL 07/09/2006    Past Surgical History:  Procedure Laterality Date  . CESAREAN SECTION  01/30/2011   Procedure: CESAREAN SECTION;  Surgeon: Tilda BurrowJohn V Ferguson, MD;  Location: [facility];  Service: Gynecology;  Laterality: N/A;  . CESAREAN SECTION N/A 10/22/2014   Procedure: CESAREAN SECTION;  Surgeon: Reva Boresanya S Pratt, MD;  Location: [facility];   Service: Obstetrics;  Laterality: N/A;  . WISDOM TOOTH EXTRACTION      OB History    Gravida Para Term Preterm AB Living   4 2 1 1 2 2    SAB TAB Ectopic Multiple Live Births   2     0 2       Home Medications    Prior to Admission medications   Medication Sig Start Date End Date Taking? Authorizing Provider  ACCU-CHEK FASTCLIX LANCETS MISC Check sugar up to 6 x daily Patient not taking: Reported on 03/25/2016 08/02/13   Tommie Samsook, Jayce G, DO  acetaminophen (TYLENOL) 500 MG tablet Take 1,000 mg by mouth every 6 (six) hours as needed for mild pain.    [provider]  fluconazole (DIFLUCAN) 150 MG tablet Take 1 tablet (150 mg total) by mouth daily. 11/11/16   Janne NapoleonNeese, Jesseka Drinkard M, NP  glipiZIDE (GLUCOTROL) 10 MG tablet Take 1 tablet (10 mg total) by mouth daily before breakfast. 07/22/16   Casey BurkittFitzgerald, Hillary Moen, MD  nystatin-triamcinolone [facility](MYCOLOG II) cream Apply to the external affected area daily 11/11/16   Janne NapoleonNeese, Adali Pennings M, NP  sitaGLIPtin (JANUVIA) 100 MG tablet Take 1 tablet (100 mg total) by mouth daily. 03/25/16   Jacalyn LefevreHaviland, Julie, MD    Family History Family History  Problem Relation Age of Onset  . Diabetes Paternal Grandmother   . Hypertension Paternal Grandmother   . Diabetes Maternal Grandmother   . Diabetes Father   . Cancer Paternal Uncle     Social History Social History  Substance  Use Topics  . Smoking status: Never Smoker  . Smokeless tobacco: Never Used  . Alcohol use Yes     Allergies   Patient has no known allergies.   Review of Systems Review of Systems  Constitutional: Negative for chills and fever.  HENT: Negative.   Gastrointestinal: Negative for abdominal pain, nausea and vomiting.  Genitourinary: Positive for vaginal discharge. Negative for decreased urine volume, difficulty urinating, dysuria, frequency, hematuria, urgency and vaginal pain.  Skin: Positive for rash.  Psychiatric/Behavioral: The patient is not nervous/anxious.      Physical  Exam Updated Vital Signs BP 137/82 (BP Location: Right Arm)   Pulse 83   Temp 99.1 F (37.3 C) (Oral)   Resp 18   LMP 11/04/2016   SpO2 100%   Physical Exam  Constitutional: She appears well-developed and well-nourished. No distress.  HENT:  Head: Normocephalic.  Eyes: EOM are normal.  Neck: Neck supple.  Cardiovascular: Normal rate.   Pulmonary/Chest: Effort normal.  Abdominal: Soft. There is no tenderness.  Genitourinary:  Genitourinary Comments: External genitalia with erythema and mild swelling, thick white cheesy d/c vaginal vault, no CMT, no adnexal tenderness.   Musculoskeletal: Normal range of motion.  Neurological: She is alert.  Skin: Skin is warm and dry.  Rash with scaling, mild erythema, and  itching to the inner aspect of both thighs.   Psychiatric: She has a normal mood and affect. Her behavior is normal.  Nursing note and vitals reviewed.    ED Treatments / Results  DIAGNOSTIC STUDIES: Oxygen Saturation is 100% on RA, NL by my interpretation.    COORDINATION OF CARE: 11:54 PM-Discussed next steps with pt. Pt verbalized understanding and is agreeable with the plan. Pt prepared for pelvic exam.   Labs (all labs ordered are listed, but only abnormal results are displayed) Labs Reviewed  WET PREP, GENITAL - Abnormal; Notable for the following:       Result Value   Yeast Wet Prep HPF POC PRESENT (*)    WBC, Wet Prep HPF POC MANY (*)    All other components within normal limits  URINALYSIS, ROUTINE W REFLEX MICROSCOPIC - Abnormal; Notable for the following:    APPearance HAZY (*)    Specific Gravity, Urine 1.033 (*)    Glucose, UA >=500 (*)    Hgb urine dipstick SMALL (*)    Leukocytes, UA MODERATE (*)    Bacteria, UA RARE (*)    Squamous Epithelial / LPF 0-5 (*)    All other components within normal limits  POC URINE PREG, ED  GC/CHLAMYDIA PROBE AMP ([place]) NOT AT [facility]   Radiology No results found.  Procedures Procedures (including  critical care time)  Medications Ordered in ED Medications  fluconazole (DIFLUCAN) tablet 200 mg (not administered)     Initial Impression / Assessment and Plan / ED Course  I have reviewed the triage vital signs and the nursing notes.  Pertinent lab results that were available during my care of the patient were reviewed by me and considered in my medical decision making (see chart for details).  Final Clinical Impressions(s) / ED Diagnoses  31 y.o. female with vaginal itching and thick white d/c stable for d/c without fever, UTI symptoms or signs of PID. Will treat for yeast infection and she will f/u with her PCP. She will return here as needed.  Final diagnoses:  Monilial vaginitis    New Prescriptions New Prescriptions   FLUCONAZOLE (DIFLUCAN) 150 MG TABLET    Take  1 tablet (150 mg total) by mouth daily.   NYSTATIN-TRIAMCINOLONE (MYCOLOG II) CREAM    Apply to the external affected area daily   I personally performed the services described in this documentation, which was scribed in my presence. The recorded information has been reviewed and is accurate.    Kerrie Buffalo [place], [place] 11/11/16 0050    Lorre Nick, MD 11/12/16 [phone]

## 2016-11-10 NOTE — ED Notes (Signed)
Patient is A&Ox4.  No signs of distress noted.  Please see providers complete history and physical exam.  
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
## 2016-11-11 LAB — WET PREP, GENITAL
CLUE CELLS WET PREP: NONE SEEN
SPERM: NONE SEEN
TRICH WET PREP: NONE SEEN

## 2016-11-11 LAB — GC/CHLAMYDIA PROBE AMP (~~LOC~~) NOT AT ARMC
Chlamydia: NEGATIVE
Neisseria Gonorrhea: NEGATIVE

## 2016-11-11 MED ORDER — FLUCONAZOLE 150 MG PO TABS: 150.0000 mg | ORAL_TABLET | Freq: Every day | ORAL | 0 refills | Status: DC

## 2016-11-11 MED ORDER — NYSTATIN-TRIAMCINOLONE 100000-0.1 UNIT/GM-% EX CREA: TOPICAL_CREAM | CUTANEOUS | 0 refills | Status: DC

## 2016-11-11 MED ORDER — FLUCONAZOLE 100 MG PO TABS
200.0000 mg | ORAL_TABLET | Freq: Once | ORAL | Status: AC
  Administered 2016-11-11: 200 mg via ORAL
  Filled 2016-11-11: qty 2

## 2016-11-11 NOTE — Discharge Instructions (Signed)
Follow up with your doctor

## 2016-11-11 NOTE — ED Notes (Signed)
PT states understanding of care given, follow up care, and medication prescribed. PT ambulated from ED to car with a steady gait. 

## 2016-11-12 ENCOUNTER — Other Ambulatory Visit: Payer: Self-pay | Admitting: Family Medicine

## 2016-12-09 ENCOUNTER — Encounter (HOSPITAL_COMMUNITY): Payer: Self-pay

## 2016-12-09 ENCOUNTER — Emergency Department (HOSPITAL_COMMUNITY): Payer: No Typology Code available for payment source

## 2016-12-09 ENCOUNTER — Emergency Department (HOSPITAL_COMMUNITY)
Admission: EM | Admit: 2016-12-09 | Discharge: 2016-12-09 | Disposition: A | Discharge status: Home / Self Care | Payer: No Typology Code available for payment source | Attending: Emergency Medicine | Admitting: Emergency Medicine

## 2016-12-09 DIAGNOSIS — I10 Essential (primary) hypertension: Secondary | ICD-10-CM | POA: Insufficient documentation

## 2016-12-09 DIAGNOSIS — S3992XA Unspecified injury of lower back, initial encounter: Secondary | ICD-10-CM | POA: Diagnosis present

## 2016-12-09 DIAGNOSIS — Y9389 Activity, other specified: Secondary | ICD-10-CM | POA: Insufficient documentation

## 2016-12-09 DIAGNOSIS — Y92410 Unspecified street and highway as the place of occurrence of the external cause: Secondary | ICD-10-CM | POA: Diagnosis not present

## 2016-12-09 DIAGNOSIS — Y999 Unspecified external cause status: Secondary | ICD-10-CM | POA: Diagnosis not present

## 2016-12-09 DIAGNOSIS — Z79899 Other long term (current) drug therapy: Secondary | ICD-10-CM | POA: Insufficient documentation

## 2016-12-09 DIAGNOSIS — Z7984 Long term (current) use of oral hypoglycemic drugs: Secondary | ICD-10-CM | POA: Insufficient documentation

## 2016-12-09 DIAGNOSIS — E119 Type 2 diabetes mellitus without complications: Secondary | ICD-10-CM | POA: Diagnosis not present

## 2016-12-09 DIAGNOSIS — S39012A Strain of muscle, fascia and tendon of lower back, initial encounter: Secondary | ICD-10-CM | POA: Diagnosis not present

## 2016-12-09 LAB — POC URINE PREG, ED: PREG TEST UR: NEGATIVE

## 2016-12-09 MED ORDER — CYCLOBENZAPRINE HCL 10 MG PO TABS
10.0000 mg | ORAL_TABLET | Freq: Once | ORAL | Status: AC
  Administered 2016-12-09: 10 mg via ORAL
  Filled 2016-12-09: qty 1

## 2016-12-09 MED ORDER — HYDROCODONE-ACETAMINOPHEN 5-325 MG PO TABS
1.0000 | ORAL_TABLET | Freq: Once | ORAL | Status: AC
  Administered 2016-12-09: 1 via ORAL
  Filled 2016-12-09: qty 1

## 2016-12-09 MED ORDER — CYCLOBENZAPRINE HCL 10 MG PO TABS: 10.0000 mg | ORAL_TABLET | Freq: Two times a day (BID) | ORAL | 0 refills | Status: DC | PRN

## 2016-12-09 MED ORDER — DICLOFENAC SODIUM 50 MG PO TBEC: 50.0000 mg | DELAYED_RELEASE_TABLET | Freq: Two times a day (BID) | ORAL | 0 refills | Status: DC

## 2016-12-09 NOTE — ED Notes (Signed)
Patient transported to X-ray 

## 2016-12-09 NOTE — ED Notes (Signed)
PT states understanding of care given, follow up care, and medication prescribed. PT ambulated from ED to car with a steady gait. 

## 2016-12-09 NOTE — Discharge Instructions (Signed)
The medication we gave you today in the ED is a narcotic and makes you sleepy.  The medication we are sending you home with is a muscle relaxant and can make you sleepy. Do not take the medication if you are driving.
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
## 2016-12-12 ENCOUNTER — Encounter: Payer: Self-pay | Admitting: Family Medicine

## 2016-12-12 ENCOUNTER — Ambulatory Visit (INDEPENDENT_AMBULATORY_CARE_PROVIDER_SITE_OTHER): Payer: Self-pay | Admitting: Family Medicine

## 2016-12-12 VITALS — BP 122/82 | HR 102 | Temp 98.4°F | Ht 61.0 in | Wt 217.0 lb

## 2016-12-12 DIAGNOSIS — Z9851 Tubal ligation status: Secondary | ICD-10-CM

## 2016-12-12 DIAGNOSIS — IMO0001 Reserved for inherently not codable concepts without codable children: Secondary | ICD-10-CM

## 2016-12-12 DIAGNOSIS — E119 Type 2 diabetes mellitus without complications: Secondary | ICD-10-CM

## 2016-12-12 DIAGNOSIS — E1165 Type 2 diabetes mellitus with hyperglycemia: Secondary | ICD-10-CM

## 2016-12-12 LAB — POCT GLYCOSYLATED HEMOGLOBIN (HGB A1C): Hemoglobin A1C: 12

## 2016-12-12 MED ORDER — GLIPIZIDE 10 MG PO TABS: 10.0000 mg | ORAL_TABLET | Freq: Every day | ORAL | 2 refills | Status: DC

## 2016-12-12 NOTE — Patient Instructions (Signed)
It was nice meeting you today.  You were seen in clinic for follow up of your diabetes and A1c check as well as medication refill.  Your A1c today was 12, which is higher than before.  I would ideally like to control this with diet, exercise and with oral diabetic medications as you do not prefer insulin and needles.  We can try to work on this over the next 3 months and I will recheck your A1c at that time.  In the meantime, I will discuss with Dr. Raymondo BandKoval other options for you like I mentioned and will let you know what we find.    Be well,  Freddrick MarchYashika Hazelle Woollard, MD

## 2016-12-12 NOTE — Progress Notes (Signed)
Subjective:   Patient ID: [name] [name]    DOB: [birth year], 31 y.o. female   MRN: [id number]  CC: f/u diabetes    HPI: [name] [name] is a 31 y.o. female who presents to clinic today for diabetes f/u.  T2DM: -A1c today is 12, higher from 10.8 earlier this year in 08/2016.  -Prior PCP Dr. Beryle Flock had asked her to check 4x/day however she does not check her sugars at home at all because she is uncomfortable and does not like needles.  -Currently supposed to be taking Glipizide 10 mg in AM and Januvia 100 mg in afternoon. However is not taking Januvia because she is not able to afford the medication.  She had been on this for about 2 years now. -Takes Glipizide only and does not want insulin. Sometimes skips doses.  Metformin has made her nauseous in the past.   -Knows when her sugars are low -- hands get sweaty, she eats a snack or drinks some OJ.   -Exercise: used to try to do [employer], money is an issue for gym membership but she is trying to get into a gym so she can become more active.  -Diet: states she doesn't really get hungry, and at times eats only 2 meals a day.  Likes salads, chicken, steak, tacos, shrimp, and fish.  Eats sweets and fried foods occasionally.   -Eye exam: has not due to cost but will get it at [facility]   -Foot exam:  Due at next visit (last was normal in 10.2017) -Microalbumin : normal in 02/2016   -Denies polyuria, drinks water, denies numbness, tingling, foot ulcers   History of tubal ligation 2016 -Performed with her last c-section at [facility] hospital.  She would like to untie them as her current partner wishes to have more children.    ROS: See HPI for pertinent ROS. PMFSH: Pertinent past medical, surgical, family, and social history were reviewed and updated as appropriate. Smoking status reviewed. Medications reviewed.   Objective:   BP 122/82   Pulse (!) 102   Temp 98.4 F (36.9 C) (Oral)   Ht 5\' 1"  (1.549 m)   Wt 217 lb (98.4 kg)   BMI  41.00 kg/m  Vitals and nursing note reviewed.  General: obese 31 yo female, no acute distress  CV: RRR no MRG, 2+ palpable pulses  Lungs: clear to auscultation bilaterally with normal work of breathing Abdomen: soft, non-tender, non-distended, +bs Skin: warm, dry, no rashes noted   Assessment & Plan:   History of tubal ligation Performed in 2016 following C-section.  Pt would like reversal as current partner desires more children.   -Discussed with pt that insurance may not cover this.  Have advised her to call OB-gyns in the area to see what best fits her needs financially.   -Additionally, explained importance of good A1c control prior to undergoing this  -will continue to follow   Type II diabetes mellitus, uncontrolled Poor control with worsening A1c of 12 today from 10.8 in 08/2016.  Discussed importance of medication adherence with patient however multiple barriers including cost, does not want needles, does not check CBG at home, no physical activity -Counseled on diet and exercise; encouraged her to go on walks and do home workouts as she states cannot afford gym membership currently  -Needs an eye exam this year, has not been due to cost -Due for foot exam next visit in November  -Continue Glipizide for now and will look into options  for Januvia vs other medications that may be more affordable.  Pt may ultimately need insulin however she is very adamant against this and states she will make more of an effort with diet/exercise -Will recheck A1c in 3 months  Orders Placed This Encounter  Procedures  . POCT HgB A1C (CPT 83036)   Meds ordered this encounter  Medications  . glipiZIDE (GLUCOTROL) 10 MG tablet    Sig: Take 1 tablet (10 mg total) by mouth daily before breakfast.    Dispense:  60 tablet    Refill:  2   Follow up: 3 months or sooner if needed   Freddrick March, MD [facility], PGY-1 12/14/2016 3:02 PM

## 2016-12-14 DIAGNOSIS — Z9851 Tubal ligation status: Secondary | ICD-10-CM | POA: Insufficient documentation

## 2016-12-14 NOTE — Assessment & Plan Note (Addendum)
Poor control with worsening A1c of 12 today from 10.8 in 08/2016.  Discussed importance of medication adherence with patient however multiple barriers including cost, does not want needles, does not check CBG at home, no physical activity -Counseled on diet and exercise; encouraged her to go on walks and do home workouts as she states cannot afford gym membership currently  -Needs an eye exam this year, has not been due to cost -Due for foot exam next visit in November  -Continue Glipizide for now and will look into options for Januvia vs other medications that may be more affordable.  Pt may ultimately need insulin however she is very adamant against this and states she will make more of an effort with diet/exercise -Will recheck A1c in 3 months

## 2016-12-14 NOTE — Assessment & Plan Note (Signed)
Performed in 2016 following C-section.  Pt would like reversal as current partner desires more children.   -Discussed with pt that insurance may not cover this.  Have advised her to call OB-gyns in the area to see what best fits her needs financially.   -Additionally, explained importance of good A1c control prior to undergoing this  -will continue to follow

## 2016-12-20 ENCOUNTER — Emergency Department (HOSPITAL_COMMUNITY)
Admission: EM | Admit: 2016-12-20 | Discharge: 2016-12-20 | Disposition: A | Discharge status: Home / Self Care | Payer: Self-pay | Attending: Emergency Medicine | Admitting: Emergency Medicine

## 2016-12-20 ENCOUNTER — Encounter (HOSPITAL_COMMUNITY): Payer: Self-pay

## 2016-12-20 DIAGNOSIS — Z79899 Other long term (current) drug therapy: Secondary | ICD-10-CM | POA: Insufficient documentation

## 2016-12-20 DIAGNOSIS — L249 Irritant contact dermatitis, unspecified cause: Secondary | ICD-10-CM | POA: Insufficient documentation

## 2016-12-20 DIAGNOSIS — I10 Essential (primary) hypertension: Secondary | ICD-10-CM | POA: Insufficient documentation

## 2016-12-20 DIAGNOSIS — E119 Type 2 diabetes mellitus without complications: Secondary | ICD-10-CM | POA: Insufficient documentation

## 2016-12-20 DIAGNOSIS — Z7984 Long term (current) use of oral hypoglycemic drugs: Secondary | ICD-10-CM | POA: Insufficient documentation

## 2016-12-20 MED ORDER — PREDNISONE 10 MG PO TABS: ORAL_TABLET | ORAL | 0 refills | Status: DC

## 2016-12-20 NOTE — ED Triage Notes (Signed)
Onset yesterday red raised rash on left arm, chest and back of neck.  Itches and burns.  No fever.

## 2016-12-20 NOTE — Discharge Instructions (Signed)
Return if any problems.

## 2016-12-20 NOTE — ED Provider Notes (Signed)
[facility]-EMERGENCY DEPT Provider Note   CSN: [id number] Arrival date & time: 12/20/16  1247     History   Chief Complaint Chief Complaint  Patient presents with  . Rash    HPI [name] [name] is a 31 y.o. female.  The history is provided by the patient. No language interpreter was used.  Rash   This is a new problem. The current episode started yesterday. The problem has not changed since onset.The problem is associated with nothing. There has been no fever. The rash is present on the neck, left arm and right arm. The pain is mild. The pain has been constant since onset. Associated symptoms include itching. She has tried nothing for the symptoms. The treatment provided no relief.    Past Medical History:  Diagnosis Date  . Abnormal Pap smear   . Diabetes mellitus    type II  . GBS (group B streptococcus) UTI complicating pregnancy   . Herpes simplex without mention of complication   . Hypertension   . Irregular menses   . Obesity     Patient Active Problem List   Diagnosis Date Noted  . History of tubal ligation   . Medication refill 07/22/2016  . Vaginal discharge 05/16/2016  . Constipation 03/04/2016  . Yeast infection 02/11/2016  . Alopecia areata 09/29/2013  . Type II diabetes mellitus, uncontrolled (HCC) 09/20/2007  . 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 Obesity 07/09/2006  . RHINITIS, ALLERGIC 07/09/2006  . PAPANICOLAOU SMEAR, ABNORMAL 07/09/2006    Past Surgical History:  Procedure Laterality Date  . CESAREAN SECTION  01/30/2011   Procedure: CESAREAN SECTION;  Surgeon: Tilda BurrowJohn V Ferguson, MD;  Location: [facility] ORS;  Service: Gynecology;  Laterality: N/A;  . CESAREAN SECTION N/A 10/22/2014   Procedure: CESAREAN SECTION;  Surgeon: Reva Boresanya S Pratt, MD;  Location: [facility] ORS;  Service: Obstetrics;  Laterality: N/A;  . WISDOM TOOTH EXTRACTION      OB History    Gravida Para Term Preterm AB Living   4 2 1 1 2 2    SAB TAB Ectopic Multiple Live Births   2      0 2       Home Medications    Prior to Admission medications   Medication Sig Start Date End Date Taking? Authorizing Provider  ACCU-CHEK FASTCLIX LANCETS MISC Check sugar up to 6 x daily Patient not taking: Reported on 03/25/2016 08/02/13   Tommie Samsook, Jayce G, DO  acetaminophen (TYLENOL) 500 MG tablet Take 1,000 mg by mouth every 6 (six) hours as needed for mild pain.    [provider]  cyclobenzaprine (FLEXERIL) 10 MG tablet Take 1 tablet (10 mg total) by mouth 2 (two) times daily as needed for muscle spasms. 12/09/16   Janne NapoleonNeese, Hope M, NP  diclofenac (VOLTAREN) 50 MG EC tablet Take 1 tablet (50 mg total) by mouth 2 (two) times daily. 12/09/16   Janne NapoleonNeese, Hope M, NP  fluconazole (DIFLUCAN) 150 MG tablet Take 1 tablet (150 mg total) by mouth daily. 11/11/16   Janne NapoleonNeese, Hope M, NP  glipiZIDE (GLUCOTROL) 10 MG tablet Take 1 tablet (10 mg total) by mouth daily before breakfast. 12/12/16   Freddrick MarchAmin, Yashika, MD  nystatin-triamcinolone [facility](MYCOLOG II) cream Apply to the external affected area daily 11/11/16   Janne NapoleonNeese, Hope M, NP  sitaGLIPtin (JANUVIA) 100 MG tablet Take 1 tablet (100 mg total) by mouth daily. 03/25/16   Jacalyn LefevreHaviland, Julie, MD    Family History Family History  Problem Relation Age of  Onset  . Diabetes Paternal Grandmother   . Hypertension Paternal Grandmother   . Diabetes Maternal Grandmother   . Diabetes Father   . Cancer Paternal Uncle     Social History Social History  Substance Use Topics  . Smoking status: Never Smoker  . Smokeless tobacco: Never Used  . Alcohol use Yes     Allergies   Patient has no known allergies.   Review of Systems Review of Systems  Skin: Positive for itching and rash.  All other systems reviewed and are negative.    Physical Exam Updated Vital Signs BP 120/67 (BP Location: Left Arm)   Pulse (!) 102   Temp 98.9 F (37.2 C) (Oral)   Resp 18   LMP 12/05/2016   SpO2 100%   Physical Exam  Constitutional: She appears well-developed and  well-nourished.  HENT:  Head: Normocephalic.  Eyes: Pupils are equal, round, and reactive to light.  Musculoskeletal: Normal range of motion.  Neurological: She is alert.  Skin:  Red raised rash, arms and legs,    Psychiatric: She has a normal mood and affect.  Nursing note and vitals reviewed.    ED Treatments / Results  Labs (all labs ordered are listed, but only abnormal results are displayed) Labs Reviewed - No data to display  EKG  EKG Interpretation None       Radiology No results found.  Procedures Procedures (including critical care time)  Medications Ordered in ED Medications - No data to display   Initial Impression / Assessment and Plan / ED Course  I have reviewed the triage vital signs and the nursing notes.  Pertinent labs & imaging results that were available during my care of the patient were reviewed by me and considered in my medical decision making (see chart for details).     An After Visit Summary was printed and given to the patient. Meds ordered this encounter  Medications  . predniSONE (DELTASONE) 10 MG tablet    Sig: 6,5,4,3,2,1 taper    Dispense:  21 tablet    Refill:  0    Order Specific Question:   Supervising Provider    Answer:   Eber Hong [[id number]]    Final Clinical Impressions(s) / ED Diagnoses   Final diagnoses:  Irritant contact dermatitis, unspecified trigger    New Prescriptions New Prescriptions   No medications on file     Osie Cheeks 12/20/16 1402    Doug Sou, MD 12/20/16 1749

## 2016-12-20 NOTE — ED Notes (Signed)
Itching, burning rash to arms, chest and back of neck. Onset last pm.

## 2016-12-27 ENCOUNTER — Emergency Department (HOSPITAL_COMMUNITY)
Admission: EM | Admit: 2016-12-27 | Discharge: 2016-12-27 | Disposition: A | Discharge status: Home / Self Care | Payer: No Typology Code available for payment source | Attending: Emergency Medicine | Admitting: Emergency Medicine

## 2016-12-27 ENCOUNTER — Emergency Department (HOSPITAL_COMMUNITY): Payer: No Typology Code available for payment source

## 2016-12-27 ENCOUNTER — Encounter (HOSPITAL_COMMUNITY): Payer: Self-pay

## 2016-12-27 DIAGNOSIS — Z87891 Personal history of nicotine dependence: Secondary | ICD-10-CM | POA: Diagnosis not present

## 2016-12-27 DIAGNOSIS — R51 Headache: Secondary | ICD-10-CM | POA: Diagnosis not present

## 2016-12-27 DIAGNOSIS — M545 Low back pain, unspecified: Secondary | ICD-10-CM

## 2016-12-27 DIAGNOSIS — Z79899 Other long term (current) drug therapy: Secondary | ICD-10-CM | POA: Insufficient documentation

## 2016-12-27 DIAGNOSIS — I10 Essential (primary) hypertension: Secondary | ICD-10-CM | POA: Insufficient documentation

## 2016-12-27 DIAGNOSIS — E119 Type 2 diabetes mellitus without complications: Secondary | ICD-10-CM | POA: Diagnosis not present

## 2016-12-27 LAB — POC URINE PREG, ED: Preg Test, Ur: NEGATIVE

## 2016-12-27 MED ORDER — DICLOFENAC SODIUM 50 MG PO TBEC: 50.0000 mg | DELAYED_RELEASE_TABLET | Freq: Two times a day (BID) | ORAL | 0 refills | Status: DC

## 2016-12-27 MED ORDER — CYCLOBENZAPRINE HCL 10 MG PO TABS
10.0000 mg | ORAL_TABLET | Freq: Once | ORAL | Status: AC
  Administered 2016-12-27: 10 mg via ORAL
  Filled 2016-12-27: qty 1

## 2016-12-27 MED ORDER — METHOCARBAMOL 500 MG PO TABS: 500.0000 mg | ORAL_TABLET | Freq: Two times a day (BID) | ORAL | 0 refills | Status: DC
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
## 2016-12-27 NOTE — Discharge Instructions (Signed)
Take the medication as directed. Do not drive while taking the muscle relaxant as it will make you sleepy. Return as needed for worsening symptoms

## 2017-02-10 ENCOUNTER — Encounter (HOSPITAL_COMMUNITY): Payer: Self-pay | Admitting: Emergency Medicine

## 2017-02-10 ENCOUNTER — Emergency Department (HOSPITAL_COMMUNITY)
Admission: EM | Admit: 2017-02-10 | Discharge: 2017-02-10 | Disposition: A | Discharge status: Home / Self Care | Payer: Self-pay | Attending: Emergency Medicine | Admitting: Emergency Medicine

## 2017-02-10 DIAGNOSIS — E119 Type 2 diabetes mellitus without complications: Secondary | ICD-10-CM | POA: Insufficient documentation

## 2017-02-10 DIAGNOSIS — Z79899 Other long term (current) drug therapy: Secondary | ICD-10-CM | POA: Insufficient documentation

## 2017-02-10 DIAGNOSIS — I10 Essential (primary) hypertension: Secondary | ICD-10-CM | POA: Insufficient documentation

## 2017-02-10 DIAGNOSIS — B373 Candidiasis of vulva and vagina: Secondary | ICD-10-CM | POA: Insufficient documentation

## 2017-02-10 DIAGNOSIS — N3000 Acute cystitis without hematuria: Secondary | ICD-10-CM | POA: Insufficient documentation

## 2017-02-10 DIAGNOSIS — Z87891 Personal history of nicotine dependence: Secondary | ICD-10-CM | POA: Insufficient documentation

## 2017-02-10 DIAGNOSIS — Z7984 Long term (current) use of oral hypoglycemic drugs: Secondary | ICD-10-CM | POA: Insufficient documentation

## 2017-02-10 DIAGNOSIS — B3731 Acute candidiasis of vulva and vagina: Secondary | ICD-10-CM

## 2017-02-10 LAB — URINALYSIS, ROUTINE W REFLEX MICROSCOPIC
Bilirubin Urine: NEGATIVE
Ketones, ur: 20 mg/dL — AB
NITRITE: NEGATIVE
PH: 6 (ref 5.0–8.0)
PROTEIN: NEGATIVE mg/dL
SPECIFIC GRAVITY, URINE: 1.032 — AB (ref 1.005–1.030)

## 2017-02-10 LAB — WET PREP, GENITAL
CLUE CELLS WET PREP: NONE SEEN
SPERM: NONE SEEN
Trich, Wet Prep: NONE SEEN

## 2017-02-10 MED ORDER — FLUCONAZOLE 100 MG PO TABS
150.0000 mg | ORAL_TABLET | Freq: Every day | ORAL | Status: DC
  Administered 2017-02-10: 150 mg via ORAL
  Filled 2017-02-10: qty 2

## 2017-02-10 MED ORDER — CEPHALEXIN 500 MG PO CAPS: 500.0000 mg | ORAL_CAPSULE | Freq: Two times a day (BID) | ORAL | 0 refills | Status: AC

## 2017-02-10 MED ORDER — NYSTATIN 100000 UNIT/GM EX CREA: TOPICAL_CREAM | CUTANEOUS | 0 refills | Status: DC

## 2017-02-10 NOTE — Discharge Instructions (Signed)
Please read instructions below. Take your antibiotic, keflex/cephalexin, as directed until it is gone. Apply the cream to your rash 2 times daily. Drink plenty of water. Schedule an appointment with your primary care provider to follow up in 1 week. Return to the ER if you develop a fever, have nausea, back pain, or new or concerning symptoms.

## 2017-02-10 NOTE — ED Provider Notes (Signed)
[facility]-EMERGENCY DEPT Provider Note   CSN: [id number] Arrival date & time: 02/10/17  4098     History   Chief Complaint Chief Complaint  Patient presents with  . Vaginitis    HPI [name] [name] is a 31 y.o. female past medical history of type 2 diabetes, HSV, presenting to the ED with acute onset of vaginal discharge and itching that began about a week ago. She states she thinks this is a since infection, and treated her symptoms with Monistat however did not find relief. She reports associated thick white discharge, and has developed dysuria. She denies abdominal pain, vaginal bleeding, nausea/vomiting, other urinary symptoms, or other complaints today. Patient declines STD testing.  The history is provided by the patient.    Past Medical History:  Diagnosis Date  . Abnormal Pap smear   . Diabetes mellitus    type II  . GBS (group B streptococcus) UTI complicating pregnancy   . Herpes simplex without mention of complication   . Hypertension   . Irregular menses   . Obesity     Patient Active Problem List   Diagnosis Date Noted  . History of tubal ligation   . Medication refill 07/22/2016  . Vaginal discharge 05/16/2016  . Constipation 03/04/2016  . Yeast infection 02/11/2016  . Alopecia areata 09/29/2013  . Type II diabetes mellitus, uncontrolled (HCC) 09/20/2007  . 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 Obesity 07/09/2006  . RHINITIS, ALLERGIC 07/09/2006  . PAPANICOLAOU SMEAR, ABNORMAL 07/09/2006    Past Surgical History:  Procedure Laterality Date  . CESAREAN SECTION  01/30/2011   Procedure: CESAREAN SECTION;  Surgeon: Tilda Burrow, MD;  Location: [facility];  Service: Gynecology;  Laterality: N/A;  . CESAREAN SECTION N/A 10/22/2014   Procedure: CESAREAN SECTION;  Surgeon: Reva Bores, MD;  Location: [facility];  Service: Obstetrics;  Laterality: N/A;  . WISDOM TOOTH EXTRACTION      OB History    Gravida Para Term Preterm AB Living   SAB TAB Ectopic Multiple Live Births   2     0 2       Home Medications    Prior to Admission medications   Medication Sig Start Date End Date Taking? Authorizing Provider  ACCU-CHEK FASTCLIX LANCETS MISC Check sugar up to 6 x daily Patient not taking: Reported on 03/25/2016 08/02/13   Tommie Sams, DO  acetaminophen (TYLENOL) 500 MG tablet Take 1,000 mg by mouth every 6 (six) hours as needed for mild pain.    [provider]  cephALEXin (KEFLEX) 500 MG capsule Take 1 capsule (500 mg total) by mouth 2 (two) times daily. 02/10/17 02/15/17  Russo, [place] N, PA-C  diclofenac (VOLTAREN) 50 MG EC tablet Take 1 tablet (50 mg total) by mouth 2 (two) times daily. 12/27/16   Janne Napoleon, NP  fluconazole (DIFLUCAN) 150 MG tablet Take 1 tablet (150 mg total) by mouth daily. 11/11/16   Janne Napoleon, NP  glipiZIDE (GLUCOTROL) 10 MG tablet Take 1 tablet (10 mg total) by mouth daily before breakfast. 12/12/16   Freddrick March, MD  methocarbamol (ROBAXIN) 500 MG tablet Take 1 tablet (500 mg total) by mouth 2 (two) times daily. 12/27/16   Janne Napoleon, NP  nystatin cream (MYCOSTATIN) Apply to affected area 2 times daily 02/10/17   Russo, [place] N, PA-C  nystatin-triamcinolone [facility] II) cream Apply to the external affected area daily 11/11/16   Damian Leavell,  Hope M, NP  predniSONE (DELTASONE) 10 MG tablet 6,5,4,3,2,1 taper 12/20/16   Cheron Schaumann K, PA-C  sitaGLIPtin (JANUVIA) 100 MG tablet Take 1 tablet (100 mg total) by mouth daily. 03/25/16   Jacalyn Lefevre, MD    Family History Family History  Problem Relation Age of Onset  . Diabetes Paternal Grandmother   . Hypertension Paternal Grandmother   . Diabetes Maternal Grandmother   . Diabetes Father   . Cancer Paternal Uncle     Social History Social History  Substance Use Topics  . Smoking status: Former Games developer  . Smokeless tobacco: Never Used  . Alcohol use Yes     Comment: "occassionally"     Allergies   Patient has no known  allergies.   Review of Systems Review of Systems  Constitutional: Negative for fever.  Gastrointestinal: Negative for abdominal pain, nausea and vomiting.  Genitourinary: Positive for dysuria and vaginal discharge. Negative for flank pain, frequency and vaginal bleeding.     Physical Exam Updated Vital Signs BP 129/79 (BP Location: Left Arm)   Pulse 89   Temp 98.6 F (37 C) (Oral)   Resp 16   Ht  (1.549 m)   Wt 93.9 kg (207 lb)   LMP 01/22/2017 (Approximate)   SpO2 100%   BMI 39.11 kg/m   Physical Exam  Constitutional: She appears well-developed and well-nourished. No distress.  Obese female  HENT:  Head: Normocephalic and atraumatic.  Eyes: Conjunctivae are normal.  Cardiovascular: Normal rate and intact distal pulses.   Pulmonary/Chest: Effort normal.  Abdominal: Soft. Bowel sounds are normal. She exhibits no distension. There is no tenderness. There is no rebound and no CVA tenderness.  Genitourinary: There is rash on the right labia. There is rash on the left labia. Cervix exhibits no motion tenderness and no friability. Right adnexum displays no mass and no tenderness. Left adnexum displays no mass and no tenderness. No bleeding in the vagina.  Genitourinary Comments: Exam performed with chaperone present. Erythematous moist rash to groin. Thick white discharge noted in vagina with vaginal erythema. No CMT, adnexal or vaginal tenderness.  Psychiatric: She has a normal mood and affect. Her behavior is normal.  Nursing note and vitals reviewed.    ED Treatments / Results  Labs (all labs ordered are listed, but only abnormal results are displayed) Labs Reviewed  WET PREP, GENITAL - Abnormal; Notable for the following:       Result Value   Yeast Wet Prep HPF POC PRESENT (*)    WBC, Wet Prep HPF POC MANY (*)    All other components within normal limits  URINALYSIS, ROUTINE W REFLEX MICROSCOPIC - Abnormal; Notable for the following:    APPearance CLOUDY (*)     Specific Gravity, Urine 1.032 (*)    Glucose, UA >=500 (*)    Hgb urine dipstick SMALL (*)    Ketones, ur 20 (*)    Leukocytes, UA LARGE (*)    Bacteria, UA MANY (*)    Squamous Epithelial / LPF 0-5 (*)    All other components within normal limits  URINE CULTURE    EKG  EKG Interpretation None       Radiology No results found.  Procedures Procedures (including critical care time)  Medications Ordered in ED Medications  fluconazole (DIFLUCAN) tablet 150 mg (150 mg Oral Given 02/10/17 1104)     Initial Impression / Assessment and Plan / ED Course  I have reviewed the triage vital signs and the nursing  notes.  Pertinent labs & imaging results that were available during my care of the patient were reviewed by me and considered in my medical decision making (see chart for details).     Pt has been diagnosed with vaginal candidiasis and a UTI. Candidal infection treated with fluconazole in ED. Pt is afebrile, no CVA tenderness, normotensive, and denies N/V. Will discharge with topical nystatin for yeast infection in groin, as well as Keflex for UTI. Pt to be dc home with instructions to follow up with PCP if symptoms persist.  Discussed results, findings, treatment and follow up. Patient advised of return precautions. Patient verbalized understanding and agreed with plan.   Final Clinical Impressions(s) / ED Diagnoses   Final diagnoses:  Candidiasis of the genitals, female  Acute cystitis without hematuria    New Prescriptions New Prescriptions   CEPHALEXIN (KEFLEX) 500 MG CAPSULE    Take 1 capsule (500 mg total) by mouth 2 (two) times daily.   NYSTATIN CREAM (MYCOSTATIN)    Apply to affected area 2 times daily     Russo, [place] N, PA-C 02/10/17 1125    Charlynne Pander, MD 02/11/17 [phone]

## 2017-02-10 NOTE — ED Triage Notes (Signed)
Pt reports thick white vag discharge with dysuria x 10 days. Pt reports using monostat x 1 week, reports worsening symptoms.

## 2017-02-11 LAB — URINE CULTURE

## 2017-03-14 ENCOUNTER — Emergency Department (HOSPITAL_COMMUNITY)
Admission: EM | Admit: 2017-03-14 | Discharge: 2017-03-14 | Disposition: A | Discharge status: Home / Self Care | Payer: Self-pay | Attending: Emergency Medicine | Admitting: Emergency Medicine

## 2017-03-14 ENCOUNTER — Encounter (HOSPITAL_COMMUNITY): Payer: Self-pay | Admitting: Emergency Medicine

## 2017-03-14 DIAGNOSIS — Z79899 Other long term (current) drug therapy: Secondary | ICD-10-CM | POA: Insufficient documentation

## 2017-03-14 DIAGNOSIS — Z87891 Personal history of nicotine dependence: Secondary | ICD-10-CM | POA: Insufficient documentation

## 2017-03-14 DIAGNOSIS — B373 Candidiasis of vulva and vagina: Secondary | ICD-10-CM | POA: Insufficient documentation

## 2017-03-14 DIAGNOSIS — E119 Type 2 diabetes mellitus without complications: Secondary | ICD-10-CM | POA: Insufficient documentation

## 2017-03-14 DIAGNOSIS — Z7984 Long term (current) use of oral hypoglycemic drugs: Secondary | ICD-10-CM | POA: Insufficient documentation

## 2017-03-14 DIAGNOSIS — I10 Essential (primary) hypertension: Secondary | ICD-10-CM | POA: Insufficient documentation

## 2017-03-14 DIAGNOSIS — B3731 Acute candidiasis of vulva and vagina: Secondary | ICD-10-CM

## 2017-03-14 LAB — WET PREP, GENITAL
CLUE CELLS WET PREP: NONE SEEN
Sperm: NONE SEEN
TRICH WET PREP: NONE SEEN
YEAST WET PREP: NONE SEEN

## 2017-03-14 MED ORDER — FLUCONAZOLE 150 MG PO TABS: 150.0000 mg | ORAL_TABLET | Freq: Every day | ORAL | 1 refills | Status: DC

## 2017-03-14 NOTE — ED Triage Notes (Signed)
Pt. Stated, I have a yeast infection and my stomach hurts since Thursday

## 2017-03-14 NOTE — ED Notes (Signed)
Pt stable, ambulatory, states understanding of discharge instructions 

## 2017-03-14 NOTE — ED Provider Notes (Signed)
[facility] Note   CSN: [id number] Arrival date & time: 03/14/17  0935     History   Chief Complaint Chief Complaint  Patient presents with  . Abdominal Pain  . Vaginal Itching    HPI [name] [name] is a 31 y.o. female.  The history is provided by the patient. No language interpreter was used.  Abdominal Pain   This is a new problem. The problem occurs constantly. The problem has been gradually worsening. The pain is located in the suprapubic region. The pain is moderate. Nothing aggravates the symptoms. Nothing relieves the symptoms. Her past medical history does not include irritable bowel syndrome.  Vaginal Itching  Associated symptoms include abdominal pain.  Patient reports she feels like she has a yeast infection.  Patient reports multiple previous yeast infections.  Patient is a diabetic she reports her glucose has been.  Patient denies any risk of any sexually transmitted disease  Past Medical History:  Diagnosis Date  . Abnormal Pap smear   . Diabetes mellitus    type II  . GBS (group B streptococcus) UTI complicating pregnancy   . Herpes simplex without mention of complication   . Hypertension   . Irregular menses   . Obesity     Patient Active Problem List   Diagnosis Date Noted  . History of tubal ligation   . Medication refill 07/22/2016  . Vaginal discharge 05/16/2016  . Constipation 03/04/2016  . Yeast infection 02/11/2016  . Alopecia areata 09/29/2013  . Type II diabetes mellitus, uncontrolled (HCC) 09/20/2007  . 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 Obesity 07/09/2006  . RHINITIS, ALLERGIC 07/09/2006  . PAPANICOLAOU SMEAR, ABNORMAL 07/09/2006    Past Surgical History:  Procedure Laterality Date  . CESAREAN SECTION  01/30/2011   Procedure: CESAREAN SECTION;  Surgeon: Tilda BurrowJohn V Ferguson, MD;  Location: [facility];  Service: Gynecology;  Laterality: N/A;  . CESAREAN SECTION N/A 10/22/2014   Procedure: CESAREAN SECTION;  Surgeon: Reva Boresanya S Pratt, MD;  Location: [facility];  Service: Obstetrics;  Laterality: N/A;  . WISDOM TOOTH EXTRACTION      OB History    Gravida Para Term Preterm AB Living   4 2 1 1 2 2    SAB TAB Ectopic Multiple Live Births   2     0 2       Home Medications    Prior to Admission medications   Medication Sig Start Date End Date Taking? Authorizing Provider  acetaminophen (TYLENOL) 500 MG tablet Take 1,000 mg by mouth every 6 (six) hours as needed for mild pain.   Yes [provider]  glipiZIDE (GLUCOTROL) 10 MG tablet Take 1 tablet (10 mg total) by mouth daily before breakfast. 12/12/16  Yes Freddrick MarchAmin, Yashika, MD  nystatin-triamcinolone [facility](MYCOLOG II) cream Apply to the external affected area daily 11/11/16  Yes Lake KatrineNeese, BidwellHope M, NP  ACCU-CHEK FASTCLIX LANCETS MISC Check sugar up to 6 x daily Patient not taking: Reported on 03/25/2016 08/02/13   Tommie Samsook, Jayce G, DO  fluconazole (DIFLUCAN) 150 MG tablet Take 1 tablet (150 mg total) by mouth daily. 03/14/17   Elson AreasSofia, Story Conti K, PA-C  sitaGLIPtin (JANUVIA) 100 MG tablet Take 1 tablet (100 mg total) by mouth daily. Patient not taking: Reported on 03/14/2017 03/25/16   Jacalyn LefevreHaviland, Julie, MD    Family History Family History  Problem Relation Age of Onset  . Diabetes Paternal Grandmother   . Hypertension Paternal Grandmother   . Diabetes  Maternal Grandmother   . Diabetes Father   . Cancer Paternal Uncle     Social History Social History  Substance Use Topics  . Smoking status: Former Games developer  . Smokeless tobacco: Never Used  . Alcohol use Yes     Comment: "occassionally"     Allergies   Patient has no known allergies.   Review of Systems Review of Systems  Gastrointestinal: Positive for abdominal pain.  All other systems reviewed and are negative.    Physical Exam Updated Vital Signs BP (!) 110/59   Pulse 73   Temp 99 F (37.2 C) (Oral)   Resp 16   Ht 5\' 1"  (1.549 m)   Wt 98.4 kg (217 lb)    LMP 02/23/2017   SpO2 100%   BMI 41.00 kg/m   Physical Exam  Constitutional: She appears well-developed and well-nourished.  HENT:  Head: Normocephalic and atraumatic.  Eyes: Pupils are equal, round, and reactive to light. Conjunctivae are normal.  Neck: Normal range of motion. Neck supple.  Cardiovascular: Normal rate.   Abdominal: Soft. There is no tenderness.  Genitourinary: Vaginal discharge found.  Genitourinary Comments: Vaginal discharge,  Thick white labia and external genitalia are erythematous and irritated appearing.  Musculoskeletal: Normal range of motion.  Skin: Skin is warm.  Psychiatric: She has a normal mood and affect.     ED Treatments / Results  Labs (all labs ordered are listed, but only abnormal results are displayed) Labs Reviewed  WET PREP, GENITAL - Abnormal; Notable for the following:       Result Value   WBC, Wet Prep HPF POC FEW (*)    All other components within normal limits  GC/CHLAMYDIA PROBE AMP ([place]) NOT AT [facility]    EKG  EKG Interpretation None       Radiology No results found.  Procedures Procedures (including critical care time)  Medications Ordered in ED Medications - No data to display   Initial Impression / Assessment and Plan / ED Course  I have reviewed the triage vital signs and the nursing notes.  Pertinent labs & imaging results that were available during my care of the patient were reviewed by me and considered in my medical decision making (see chart for details).     Lab reports wet prep is over diluted they could not verify the presence of yeast. Clinically this patient has a yeast infection I will treat her with Diflucan patient is advised to follow-up with her primary care physician for recheck.  GC and CT are pending.  Final Clinical Impressions(s) / ED Diagnoses   Final diagnoses:  Yeast vaginitis    New Prescriptions Discharge Medication List as of 03/14/2017 10:46 AM    An After Visit  Summary was printed and given to the patient.    Elson Areas, [place] 03/14/17 1830    Benjiman Core, MD 03/15/17 [phone]

## 2017-03-16 LAB — GC/CHLAMYDIA PROBE AMP (~~LOC~~) NOT AT ARMC
Chlamydia: NEGATIVE
Neisseria Gonorrhea: NEGATIVE

## 2017-03-24 ENCOUNTER — Ambulatory Visit: Payer: Self-pay | Admitting: Family Medicine

## 2017-04-16 ENCOUNTER — Telehealth: Payer: Self-pay | Admitting: Family Medicine

## 2017-04-16 NOTE — Telephone Encounter (Signed)
Diabetes form dropped off for at front desk for completion. (Patient request, if at all possible to be completed today as she needs it to work)  Verified that patient section of form has been completed.  Last DOS with PCP was 08/14/16.  Placed form in team folder to be completed by clinical staff.  Chari ManningLynette D Sells

## 2017-04-16 NOTE — Telephone Encounter (Signed)
Clinical info completed on DOT form.  Place form in Dr. Shanda BumpsAmin's box for completion.  Feliz BeamHARTSELL,  Geran Haithcock, CMA

## 2017-04-17 NOTE — Telephone Encounter (Signed)
Employer wants to know if the form will be ready before 5 today. Please call pt when ready to be picked up

## 2017-04-23 NOTE — Telephone Encounter (Signed)
This form is in the [employer] box ready to be completed. Pt has been dealing with this for a week or so now. She wanted the form completed before now, but would like it to be completed by 5 this afternoon to take to her work. Please advise

## 2017-04-28 NOTE — Telephone Encounter (Signed)
Form has been picked up by patient on Thursday

## 2017-05-13 ENCOUNTER — Ambulatory Visit: Payer: Self-pay

## 2017-05-20 ENCOUNTER — Other Ambulatory Visit: Payer: Self-pay

## 2017-05-20 ENCOUNTER — Ambulatory Visit (INDEPENDENT_AMBULATORY_CARE_PROVIDER_SITE_OTHER): Payer: Self-pay | Admitting: Family Medicine

## 2017-05-20 ENCOUNTER — Encounter: Payer: Self-pay | Admitting: Family Medicine

## 2017-05-20 ENCOUNTER — Ambulatory Visit (HOSPITAL_COMMUNITY)
Admission: RE | Admit: 2017-05-20 | Discharge: 2017-05-20 | Disposition: A | Discharge status: Home / Self Care | Payer: No Typology Code available for payment source | Source: Ambulatory Visit | Attending: Family Medicine | Admitting: Family Medicine

## 2017-05-20 VITALS — BP 122/82 | HR 77 | Temp 98.6°F | Ht 61.0 in | Wt 220.0 lb

## 2017-05-20 DIAGNOSIS — R519 Headache, unspecified: Secondary | ICD-10-CM

## 2017-05-20 DIAGNOSIS — R079 Chest pain, unspecified: Secondary | ICD-10-CM | POA: Diagnosis not present

## 2017-05-20 DIAGNOSIS — E1165 Type 2 diabetes mellitus with hyperglycemia: Secondary | ICD-10-CM

## 2017-05-20 DIAGNOSIS — E119 Type 2 diabetes mellitus without complications: Secondary | ICD-10-CM

## 2017-05-20 DIAGNOSIS — R51 Headache: Secondary | ICD-10-CM

## 2017-05-20 LAB — POCT GLYCOSYLATED HEMOGLOBIN (HGB A1C): Hemoglobin A1C: 10.5

## 2017-05-20 MED ORDER — GLIPIZIDE 10 MG PO TABS: 10.0000 mg | ORAL_TABLET | Freq: Every day | ORAL | 2 refills | Status: DC

## 2017-05-20 MED ORDER — ACETAMINOPHEN 500 MG PO TABS
500.0000 mg | ORAL_TABLET | Freq: Once | ORAL | Status: AC
  Administered 2017-05-20: 500 mg via ORAL

## 2017-05-20 NOTE — Patient Instructions (Signed)
You were seen in clinic for chest pain.  We discussed several causes of this and I have ordered some bloodwork and an EKG.  Your EKG was normal.  I will call you with the results of your labwork once they return.  In the meantime, I would like for you to take note of when your symptoms occur.    Additionally, we checked your A1c today and you are doing better.  I would like for you to continue your diet and exercise regimen.  You can also continue your glipizide and I will call you regarding which medication may be a better option for you once I speak with Dr. Raymondo BandKoval.   Please call clinic if you have any questions.    Be well, Freddrick MarchYashika Blakley Michna, MD

## 2017-05-20 NOTE — Assessment & Plan Note (Signed)
Improvement in A1c to 10.5 from 12.0 in 12/2016.  Encouraged patient to continue her lifestyle changes as diet and exercise have significantly helped.   -Refill Glipizide -Will hold off on Januvia for now and recheck A1c in 3-4 months to ensure she continues to do well -Would recommend she check blood sugars more often however patient does not desire handling needles  -F/u 3 months

## 2017-05-20 NOTE — Assessment & Plan Note (Signed)
Reports CP associated with mild SOB at rest.  Low suspicion for PE given no recent travel, nonsmoker, and vitals within normal. Unlikely infectious etiology as lungs clear on exam, afebrile and no other symptoms.  Possible this may be musculoskeletal in nature, however not reproducible on exam.  Office EKG performed which revealed NSR and no evidence of ischemia. Per chart review, last labs in 2016.  Will recheck to assess risk factors and rule out other causes ie anemia, electrolyte abnormalities.  -Obtain lipid panel, CBC, BMET  -F/u 4 weeks
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
## 2017-05-21 LAB — CBC
Hematocrit: 33.2 % — ABNORMAL LOW (ref 34.0–46.6)
Hemoglobin: 9.9 g/dL — ABNORMAL LOW (ref 11.1–15.9)
MCH: 23.4 pg — ABNORMAL LOW (ref 26.6–33.0)
MCHC: 29.8 g/dL — ABNORMAL LOW (ref 31.5–35.7)
MCV: 79 fL (ref 79–97)
PLATELETS: 425 10*3/uL — AB (ref 150–379)
RBC: 4.23 x10E6/uL (ref 3.77–5.28)
RDW: 16.1 % — ABNORMAL HIGH (ref 12.3–15.4)
WBC: 6.5 10*3/uL (ref 3.4–10.8)

## 2017-05-21 LAB — LIPID PANEL
Chol/HDL Ratio: 3.4 ratio (ref 0.0–4.4)
Cholesterol, Total: 142 mg/dL (ref 100–199)
HDL: 42 mg/dL (ref >39)
LDL Calculated: 86 mg/dL (ref 0–99)
Triglycerides: 71 mg/dL (ref 0–149)
VLDL CHOLESTEROL CAL: 14 mg/dL (ref 5–40)

## 2017-05-21 LAB — BASIC METABOLIC PANEL
BUN/Creatinine Ratio: 17 (ref 9–23)
BUN: 9 mg/dL (ref 6–20)
CALCIUM: 9 mg/dL (ref 8.7–10.2)
CHLORIDE: 101 mmol/L (ref 96–106)
CO2: 23 mmol/L (ref 20–29)
Creatinine, Ser: 0.53 mg/dL — ABNORMAL LOW (ref 0.57–1.00)
GFR calc non Af Amer: 129 mL/min/{1.73_m2} (ref >59)
GFR, EST AFRICAN AMERICAN: 148 mL/min/{1.73_m2} (ref >59)
Glucose: 209 mg/dL — ABNORMAL HIGH (ref 65–99)
Potassium: 4.5 mmol/L (ref 3.5–5.2)
Sodium: 134 mmol/L (ref 134–144)

## 2017-06-12 ENCOUNTER — Telehealth: Payer: Self-pay | Admitting: Family Medicine

## 2017-06-12 NOTE — Telephone Encounter (Signed)
[employer] form dropped off for at front desk for completion.  Verified that patient section of form has been completed.  Last DOS/WCC with PCP was 05/20/17.  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  Lina Sarheryl A Stanley

## 2017-06-22 NOTE — Telephone Encounter (Signed)
Pt called to see if her form had been completed

## 2017-06-24 NOTE — Telephone Encounter (Signed)
Called patient and informed patient that she could pick up completed form at front desk.Glennie HawkSimpson, Leola Fiore R, CMA

## 2017-06-24 NOTE — Telephone Encounter (Signed)
Please inform patient I have left this at the front desk for her to pick up.  Thanks

## 2017-07-21 ENCOUNTER — Emergency Department (HOSPITAL_COMMUNITY)
Admission: EM | Admit: 2017-07-21 | Discharge: 2017-07-21 | Disposition: A | Discharge status: Home / Self Care | Payer: Self-pay | Attending: Emergency Medicine | Admitting: Emergency Medicine

## 2017-07-21 ENCOUNTER — Other Ambulatory Visit: Payer: Self-pay

## 2017-07-21 ENCOUNTER — Encounter (HOSPITAL_COMMUNITY): Payer: Self-pay | Admitting: Emergency Medicine

## 2017-07-21 DIAGNOSIS — Z79899 Other long term (current) drug therapy: Secondary | ICD-10-CM | POA: Insufficient documentation

## 2017-07-21 DIAGNOSIS — N898 Other specified noninflammatory disorders of vagina: Secondary | ICD-10-CM | POA: Insufficient documentation

## 2017-07-21 DIAGNOSIS — E119 Type 2 diabetes mellitus without complications: Secondary | ICD-10-CM | POA: Insufficient documentation

## 2017-07-21 DIAGNOSIS — Z87891 Personal history of nicotine dependence: Secondary | ICD-10-CM | POA: Insufficient documentation

## 2017-07-21 DIAGNOSIS — I10 Essential (primary) hypertension: Secondary | ICD-10-CM | POA: Insufficient documentation

## 2017-07-21 LAB — URINALYSIS, MICROSCOPIC (REFLEX)

## 2017-07-21 LAB — WET PREP, GENITAL
Clue Cells Wet Prep HPF POC: NONE SEEN
Sperm: NONE SEEN
TRICH WET PREP: NONE SEEN
YEAST WET PREP: NONE SEEN

## 2017-07-21 LAB — URINALYSIS, ROUTINE W REFLEX MICROSCOPIC
BILIRUBIN URINE: NEGATIVE
Glucose, UA: >=500 mg/dL — AB
Hgb urine dipstick: NEGATIVE
KETONES UR: 15 mg/dL — AB
LEUKOCYTES UA: NEGATIVE
NITRITE: POSITIVE — AB
PROTEIN: NEGATIVE mg/dL
Specific Gravity, Urine: 1.02 (ref 1.005–1.030)
pH: 6 (ref 5.0–8.0)

## 2017-07-21 LAB — CBG MONITORING, ED: Glucose-Capillary: 326 mg/dL — ABNORMAL HIGH (ref 65–99)

## 2017-07-21 LAB — POC URINE PREG, ED: PREG TEST UR: NEGATIVE

## 2017-07-21 MED ORDER — FLUCONAZOLE 150 MG PO TABS: 150.0000 mg | ORAL_TABLET | Freq: Every day | ORAL | 0 refills | Status: DC

## 2017-07-21 MED ORDER — FLUCONAZOLE 150 MG PO TABS
150.0000 mg | ORAL_TABLET | Freq: Once | ORAL | Status: AC
  Administered 2017-07-21: 150 mg via ORAL
  Filled 2017-07-21: qty 1

## 2017-07-21 MED ORDER — CEPHALEXIN 500 MG PO CAPS: 500.0000 mg | ORAL_CAPSULE | Freq: Two times a day (BID) | ORAL | 0 refills | Status: DC

## 2017-07-21 NOTE — Discharge Instructions (Signed)
You were seen in the emergency department today for vaginal discharge.  As discussed your lab work showed some abnormalities, somewhat consistent with a urinary tract infection.  We treated you with Diflucan in the emergency department for a possible yeast infection, we also gave you an additional tablet of this to take in 1 week if your symptoms have not resolved.  We are treating the potential urinary tract infection with Keflex, this is an antibiotic. Please take all of your antibiotics until finished. You may develop abdominal discomfort or diarrhea from the antibiotic.  You may help offset this with probiotics which you can buy at the store (ask your pharmacist if unable to find) or get probiotics in the form of eating yogurt. Do not eat or take the probiotics until 2 hours after your antibiotic. If you are unable to tolerate these side effects follow-up with your primary care provider or return to the emergency department.   If you begin to experience any blistering, rashes, swelling, or difficulty breathing seek medical care for evaluation of potentially more serious side effects.   Please be aware that this medication may interact with other medications you are taking, please be sure to discuss your medication list with your pharmacist. If you are taking birth control the antibiotic will deactivate your birth control for 2 weeks.    Follow up with your primary care provider or the health department in 1 week for reevaluation of your symptoms.  Return to the emergency department for any new or worsening symptoms including but not limited to fever, chills, abdominal pain, or any other concerns you may have  Additionally your blood sugar was elevated in the emergency department today at 326, be sure to check this tomorrow and regularly follow-up with your primary care provider regarding it.

## 2017-07-21 NOTE — ED Triage Notes (Signed)
Vaginal discharge and urinary frequency-- started last week- white discharge.

## 2017-07-21 NOTE — ED Triage Notes (Signed)
Pt denies urinary frequency- but has burning with urination

## 2017-07-21 NOTE — ED Provider Notes (Signed)
[facility] EMERGENCY DEPARTMENT Provider Note   CSN: [id number] Arrival date & time: 07/21/17  1159     History   Chief Complaint Chief Complaint  Patient presents with  . Vaginal Discharge    HPI [name] [name] is a 32 y.o. female with a history of diabetes mellitus, HSV, and hypertension who presents to the emergency department today with complaint of vaginal discharge for 5 days.  Describes discharge as being white/thick.  Having associated vaginal pruritus as well as urinary frequency and dysuria.  No alleviating or aggravating factors.  States this feels similar to previous yeast infections.  Denies fever, chills, abdominal pain, vaginal bleeding.  Patient states LMP was 06/27/17.  Patient is sexually active in a monogamous relationship with her husband.  States blood glucose has been running in the 120s, states she recently ate, but has not checked her blood sugar today.  HPI  Past Medical History:  Diagnosis Date  . Abnormal Pap smear   . Diabetes mellitus    type II  . GBS (group B streptococcus) UTI complicating pregnancy   . Herpes simplex without mention of complication   . Hypertension   . Irregular menses   . Obesity     Patient Active Problem List   Diagnosis Date Noted  . Chest pain 05/20/2017  . History of tubal ligation   . Medication refill 07/22/2016  . Vaginal discharge 05/16/2016  . Constipation 03/04/2016  . Yeast infection 02/11/2016  . Alopecia areata 09/29/2013  . Type II diabetes mellitus, uncontrolled (HCC) 09/20/2007  . 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 Obesity 07/09/2006  . RHINITIS, ALLERGIC 07/09/2006  . PAPANICOLAOU SMEAR, ABNORMAL 07/09/2006    Past Surgical History:  Procedure Laterality Date  . CESAREAN SECTION  01/30/2011   Procedure: CESAREAN SECTION;  Surgeon: Tilda Burrow, MD;  Location: [facility];  Service: Gynecology;  Laterality: N/A;  . CESAREAN SECTION N/A 10/22/2014   Procedure:  CESAREAN SECTION;  Surgeon: Reva Bores, MD;  Location: [facility];  Service: Obstetrics;  Laterality: N/A;  . TUBAL LIGATION    . WISDOM TOOTH EXTRACTION      OB History    Gravida Para Term Preterm AB Living   4 2 1 1 2 2    SAB TAB Ectopic Multiple Live Births   2     0 2       Home Medications    Prior to Admission medications   Medication Sig Start Date End Date Taking? Authorizing Provider  ACCU-CHEK FASTCLIX LANCETS MISC Check sugar up to 6 x daily Patient not taking: Reported on 03/25/2016 08/02/13   Tommie Sams, DO  acetaminophen (TYLENOL) 500 MG tablet Take 1,000 mg by mouth every 6 (six) hours as needed for mild pain.    [provider]  fluconazole (DIFLUCAN) 150 MG tablet Take 1 tablet (150 mg total) by mouth daily. 03/14/17   Elson Areas, PA-C  glipiZIDE (GLUCOTROL) 10 MG tablet Take 1 tablet (10 mg total) by mouth daily before breakfast. 05/20/17   Freddrick March, MD  nystatin-triamcinolone [facility] II) cream Apply to the external affected area daily 11/11/16   Janne Napoleon, NP  sitaGLIPtin (JANUVIA) 100 MG tablet Take 1 tablet (100 mg total) by mouth daily. Patient not taking: Reported on 03/14/2017 03/25/16   Jacalyn Lefevre, MD    Family History Family History  Problem Relation Age of Onset  . Diabetes Paternal Grandmother   . Hypertension Paternal Grandmother   .  Diabetes Maternal Grandmother   . Diabetes Father   . Cancer Paternal Uncle     Social History Social History   Tobacco Use  . Smoking status: Former Games developer  . Smokeless tobacco: Never Used  Substance Use Topics  . Alcohol use: Yes    Comment: "occassionally"  . Drug use: No     Allergies   Patient has no known allergies.   Review of Systems Review of Systems  Constitutional: Negative for chills and fever.  Gastrointestinal: Negative for abdominal pain, diarrhea, nausea and vomiting.  Genitourinary: Positive for dysuria, frequency and vaginal discharge. Negative for vaginal  bleeding.  All other systems reviewed and are negative.    Physical Exam Updated Vital Signs BP 122/68 (BP Location: Right Arm)   Pulse 87   Temp 99.1 F (37.3 C) (Oral)   Resp 16   LMP 06/27/2017 (Exact Date)   SpO2 100%   Physical Exam  Constitutional: She appears well-developed and well-nourished. No distress.  HENT:  Head: Normocephalic and atraumatic.  Eyes: Conjunctivae are normal. Right eye exhibits no discharge. Left eye exhibits no discharge.  Cardiovascular: Normal rate and regular rhythm.  No murmur heard. Pulmonary/Chest: Breath sounds normal. No respiratory distress. She has no wheezes. She has no rales.  Abdominal: Soft. She exhibits no distension. There is no tenderness. There is no rebound, no guarding and no CVA tenderness.  Genitourinary: Pelvic exam was performed with patient supine. There is no lesion on the right labia. There is no lesion on the left labia. Cervix exhibits discharge. Cervix exhibits no motion tenderness and no friability. Right adnexum displays no mass and no tenderness. Left adnexum displays no mass and no tenderness. Vaginal discharge (white, minimal amount to vaginal vault and cervix area) found.  Genitourinary Comments: Labia majora with some mild erythema.  No lesions.  EDT present as chaperone.  Neurological: She is alert.  Clear speech.   Skin: Skin is warm and dry. No rash noted.  Psychiatric: She has a normal mood and affect. Her behavior is normal.  Nursing note and vitals reviewed.   ED Treatments / Results  Labs Results for orders placed or performed during the hospital encounter of 07/21/17  Wet prep, genital  Result Value Ref Range   Yeast Wet Prep HPF POC NONE SEEN NONE SEEN   Trich, Wet Prep NONE SEEN NONE SEEN   Clue Cells Wet Prep HPF POC NONE SEEN NONE SEEN   WBC, Wet Prep HPF POC MANY (A) NONE SEEN   Sperm NONE SEEN   Urinalysis, Routine w reflex microscopic  Result Value Ref Range   Color, Urine YELLOW YELLOW    APPearance CLEAR CLEAR   Specific Gravity, Urine 1.020 1.005 - 1.030   pH 6.0 5.0 - 8.0   Glucose, UA >=500 (A) NEGATIVE mg/dL   Hgb urine dipstick NEGATIVE NEGATIVE   Bilirubin Urine NEGATIVE NEGATIVE   Ketones, ur 15 (A) NEGATIVE mg/dL   Protein, ur NEGATIVE NEGATIVE mg/dL   Nitrite POSITIVE (A) NEGATIVE   Leukocytes, UA NEGATIVE NEGATIVE  Urinalysis, Microscopic (reflex)  Result Value Ref Range   RBC / HPF 0-5 0 - 5 RBC/hpf   WBC, UA 6-30 0 - 5 WBC/hpf   Bacteria, UA MANY (A) NONE SEEN   Squamous Epithelial / LPF 6-30 (A) NONE SEEN  POC CBG, ED  Result Value Ref Range   Glucose-Capillary 326 (H) 65 - 99 mg/dL   Comment 1 Notify RN    Comment 2 Document in Chart  POC urine preg, ED  Result Value Ref Range   Preg Test, Ur NEGATIVE NEGATIVE   No results found. EKG  EKG Interpretation None       Radiology No results found.  Procedures Procedures (including critical care time)  Medications Ordered in ED Medications - No data to display   Initial Impression / Assessment and Plan / ED Course  I have reviewed the triage vital signs and the nursing notes.  Pertinent labs & imaging results that were available during my care of the patient were reviewed by me and considered in my medical decision making (see chart for details).   Patient presents with complaint of vaginal discharge, dysuria, and urinary frequency. Patient is nontoxic appearing, in no apparent distress, vitals WNL. Exam notable for no abdominal tenderness or peritoneal signs. Pelvic exam with some mild erythema to the labia majora, minimal white discharge. No adnexal tenderness or CMT. Labs reviewed. UA somewhat suspicious for UTI given patient's sxs with bacteria and (+) nitrites, however no leukocytes, will treat for UTI with keflex and send for culture. Wet prep with WBCs, no detectable yeasts or clue cells to indicate yeast or BV, however patient fairly persistent that this is similar to prior yeast  infections- will tx with Diflucan. Offered treatment for gonorrhea/chlamydia with cultures pending, patient declined. POC urine pregnancy negative. No indication of tuboovarian abscess, PID, or ectopic pregnancy. Patient with hx of DM, noted to have hyperglycemia at 326- discussed need for close following/rechecks with PCP follow up. I discussed results, treatment plan, need for PCP follow-up, and return precautions with the patient. Provided opportunity for questions, patient confirmed understanding and is in agreement with plan.   Final Clinical Impressions(s) / ED Diagnoses   Final diagnoses:  Vaginal discharge    ED Discharge Orders        Ordered    cephALEXin (KEFLEX) 500 MG capsule  2 times daily     07/21/17 1415    fluconazole (DIFLUCAN) 150 MG tablet  Daily     07/21/17 1415       Zari Cly, Pleas KochSamantha R, PA-C 07/21/17 1440    Jacalyn LefevreHaviland, Julie, MD 07/21/17 1455

## 2017-07-21 NOTE — ED Notes (Signed)
CBG 326 reported to TomahSamantha PA and Vela ProseMaggie Barber RN

## 2017-07-21 NOTE — ED Provider Notes (Signed)
Patient placed in Quick Look pathway, seen and evaluated   Chief Complaint: vaginal discharge   HPI:   White vaginal discharge with itching with urination urinary frequency. H/o DM on oral meds, CBG usually around 120s. Did not check today.   ROS: no fevers, chills, frequency, abdominal pain, flank pain, polydipsia or polyuria  Physical Exam:   Gen: No distress  Neuro: Awake and Alert  Skin: Warm    Focused Exam: Lungs CTAB. Abdomen soft NTND. No suprapubic or CVAT.   Likely yeast vaginitis. Pt not interested in STD testing today. Sexually active with husband. Initiation of care has begun. The patient has been counseled on the process, plan, and necessity for staying for the completion/evaluation, and the remainder of the medical screening examination   Jerrell MylarGibbons, Claudia J, PA-C 07/21/17 1234    Mabe, Latanya MaudlinMartha L, MD 07/21/17 1250

## 2017-07-22 LAB — GC/CHLAMYDIA PROBE AMP (~~LOC~~) NOT AT ARMC
CHLAMYDIA, DNA PROBE: NEGATIVE
Neisseria Gonorrhea: NEGATIVE

## 2017-07-23 LAB — URINE CULTURE

## 2017-07-24 ENCOUNTER — Telehealth: Payer: Self-pay | Admitting: *Deleted

## 2017-07-24 NOTE — Telephone Encounter (Signed)
Post ED Visit - Positive Culture Follow-up  Culture report reviewed by antimicrobial stewardship pharmacist:  []  Enzo BiNathan Batchelder, Pharm.D. []  Celedonio MiyamotoJeremy Frens, [address]Ph[address]D., BCPS AQ-ID [x]  Garvin FilaMike Maccia, Pharm.D., BCPS []  Georgina PillionElizabeth Martin, Pharm.D., BCPS []  DoverMinh Pham, [address]Pharm[address]D., BCPS, AAHIVP []  Estella HuskMichelle Turner, Pharm.D., BCPS, AAHIVP []  Lysle Pearlachel Rumbarger, PharmD, BCPS []  Blake DivineShannon Parkey, PharmD []  Pollyann SamplesAndy Johnston, PharmD, BCPS  Positive urine culture Treated with  Cephalexin, organism sensitive to the same and no further patient follow-up is required at this time.  Virl AxeRobertson, Danielly Ackerley [facility] Hospital[facility] 07/24/2017, 11:15 AM

## 2017-07-31 ENCOUNTER — Encounter: Payer: Self-pay | Admitting: Family Medicine

## 2017-07-31 ENCOUNTER — Ambulatory Visit (INDEPENDENT_AMBULATORY_CARE_PROVIDER_SITE_OTHER): Payer: Self-pay | Admitting: Family Medicine

## 2017-07-31 VITALS — BP 120/80 | HR 85 | Temp 98.2°F | Wt 221.6 lb

## 2017-07-31 DIAGNOSIS — E119 Type 2 diabetes mellitus without complications: Secondary | ICD-10-CM

## 2017-07-31 DIAGNOSIS — E1165 Type 2 diabetes mellitus with hyperglycemia: Secondary | ICD-10-CM

## 2017-07-31 LAB — POCT GLYCOSYLATED HEMOGLOBIN (HGB A1C): Hemoglobin A1C: 10.3

## 2017-07-31 MED ORDER — METFORMIN HCL 500 MG PO TABS: 500.0000 mg | ORAL_TABLET | Freq: Two times a day (BID) | ORAL | 2 refills | Status: DC

## 2017-07-31 NOTE — Patient Instructions (Addendum)
It was nice seeing you again!  You were seen in clinic for a repeat hemoglobin A1c to check your diabetes.  I have provided you with a printout of the results from today along with your DMV form.  As we discussed, you will need better control of your diabetes to a goal of  <7.0.  I am restarting Metformin 500 mg for you today.  I would like for you to take 1 tablet daily for about a week or 2.  If you are tolerating this well, you can increase to 2 tablets daily.  I would like you to make an appointment in pharmacy clinic with Dr. Raymondo BandKoval for further management of your diabetes and I have discussed this with him today.  Please call clinic if you have any questions.  Be well, [name] MarchYashika Keyshon Stein MD

## 2017-07-31 NOTE — Progress Notes (Signed)
   Subjective:   Patient ID: [name] [name]    DOB: 1985/07/21, 32 y.o. female   MRN: [id number]  CC: repeat A1c  HPI: [name] [name] is a 32 y.o. female who presents to clinic today to repeat A1c.  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  She had a recent A1c of 10.5 in January of this year.  She is taking glipizide 10 mg daily.  Reports good compliance with medications. -Checking sugars at home: every day after lunch, sugars generally run 100-200.  She reports once in a while cbg elevated to 300.   -Symptoms: no lightheadedness, dizziness or hypoglycemia -Exercise: tries to walk daily, she is trying to lose weight -Diet: eats 2 meals a day, usually skips one meal, no snacking in between meals, drinking adequate amount water, limits soda and juices   ROS: No fever, chills, nausea, vomiting.  No shortness of breath, abdominal pain or palpitations.  Social: Patient is a former smoker Medications reviewed. Objective:   BP 120/80   Pulse 85   Temp 98.2 F (36.8 C)   Wt 221 lb 9.6 oz (100.5 kg)   LMP 07/26/2017   SpO2 98%   BMI 41.87 kg/m  Vitals and nursing note reviewed.  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  Patient states in the past, she has been hesitant to restart Metformin 2/2 cost and does not have insurance.  Insulin has also been discussed and patient states she is afraid of needles.  She states she would rather try to control her  diabetes with diet and exercise.   -A1c today is 10.3 -Plan to restart Metformin today - discussed with patient and she is agreeable. -Rx: Metformin 500 mg BID; advised patient to start with 500 mg daily and titrate up to twice daily if she is tolerating well - Advised patient to follow-up in 1-2 weeks with pharmacy clinic, Dr. Raymondo Band, for further management  Orders Placed This Encounter  Procedures  . HgB A1c   Meds ordered this encounter  Medications  . metFORMIN (GLUCOPHAGE) 500 MG tablet    Sig: Take 1 tablet (500 mg total) by mouth 2 (two) times daily with a meal.    Dispense:  90 tablet    Refill:  2    Freddrick March, MD [facility], PGY-2 07/31/2017 3:34 PM

## 2017-07-31 NOTE — Assessment & Plan Note (Signed)
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  Patient states in the past, she has been hesitant to restart Metformin 2/2 cost and does not have insurance.  Insulin has also been discussed and patient states she is afraid of needles.  She states she would rather try to control her diabetes with diet and exercise.   -A1c today is 10.3 -Plan to restart Metformin today - discussed with patient and she is agreeable. -Rx: Metformin 500 mg BID; advised patient to start with 500 mg daily and titrate up to twice daily if she is tolerating well - Advised patient to follow-up in 1-2 weeks with pharmacy clinic, Dr. Raymondo BandKoval, for further management

## 2017-08-13 ENCOUNTER — Ambulatory Visit: Payer: Self-pay | Admitting: Pharmacist

## 2017-08-20 ENCOUNTER — Ambulatory Visit: Payer: Self-pay | Admitting: Pharmacist

## 2017-10-02 ENCOUNTER — Other Ambulatory Visit: Payer: Self-pay

## 2017-10-02 ENCOUNTER — Emergency Department (HOSPITAL_COMMUNITY)
Admission: EM | Admit: 2017-10-02 | Discharge: 2017-10-02 | Disposition: A | Discharge status: Home / Self Care | Payer: Self-pay | Attending: Emergency Medicine | Admitting: Emergency Medicine

## 2017-10-02 ENCOUNTER — Encounter (HOSPITAL_COMMUNITY): Payer: Self-pay

## 2017-10-02 DIAGNOSIS — Z5321 Procedure and treatment not carried out due to patient leaving prior to being seen by health care provider: Secondary | ICD-10-CM | POA: Insufficient documentation

## 2017-10-02 DIAGNOSIS — R300 Dysuria: Secondary | ICD-10-CM | POA: Insufficient documentation

## 2017-10-02 NOTE — ED Triage Notes (Signed)
Pt states that she has been treating herself for a yeast infection for the past week and has started to have some dysuria. Pt reports she gets yeast infections frequently r/t being a diabetic. States her CBG at home has been high the past 2 days with the highest being 240

## 2017-10-02 NOTE — ED Notes (Signed)
Pt called for repeat vital signs by McKenzie, NT x3.  No response.

## 2017-10-18 ENCOUNTER — Other Ambulatory Visit: Payer: Self-pay

## 2017-10-18 ENCOUNTER — Emergency Department (HOSPITAL_COMMUNITY)
Admission: EM | Admit: 2017-10-18 | Discharge: 2017-10-18 | Disposition: A | Discharge status: Home / Self Care | Payer: Self-pay | Attending: Emergency Medicine | Admitting: Emergency Medicine

## 2017-10-18 ENCOUNTER — Encounter (HOSPITAL_COMMUNITY): Payer: Self-pay | Admitting: Emergency Medicine

## 2017-10-18 DIAGNOSIS — I10 Essential (primary) hypertension: Secondary | ICD-10-CM | POA: Insufficient documentation

## 2017-10-18 DIAGNOSIS — Z7984 Long term (current) use of oral hypoglycemic drugs: Secondary | ICD-10-CM | POA: Insufficient documentation

## 2017-10-18 DIAGNOSIS — B3731 Acute candidiasis of vulva and vagina: Secondary | ICD-10-CM

## 2017-10-18 DIAGNOSIS — E119 Type 2 diabetes mellitus without complications: Secondary | ICD-10-CM | POA: Insufficient documentation

## 2017-10-18 DIAGNOSIS — Z79899 Other long term (current) drug therapy: Secondary | ICD-10-CM | POA: Insufficient documentation

## 2017-10-18 DIAGNOSIS — B373 Candidiasis of vulva and vagina: Secondary | ICD-10-CM | POA: Insufficient documentation

## 2017-10-18 LAB — URINALYSIS, ROUTINE W REFLEX MICROSCOPIC
BILIRUBIN URINE: NEGATIVE
Glucose, UA: >=500 mg/dL — AB
HGB URINE DIPSTICK: NEGATIVE
Ketones, ur: NEGATIVE mg/dL
Nitrite: NEGATIVE
PROTEIN: NEGATIVE mg/dL
Specific Gravity, Urine: 1.032 — ABNORMAL HIGH (ref 1.005–1.030)
pH: 6 (ref 5.0–8.0)

## 2017-10-18 LAB — WET PREP, GENITAL
CLUE CELLS WET PREP: NONE SEEN
SPERM: NONE SEEN
Trich, Wet Prep: NONE SEEN
Yeast Wet Prep HPF POC: NONE SEEN

## 2017-10-18 LAB — POC URINE PREG, ED: PREG TEST UR: NEGATIVE

## 2017-10-18 MED ORDER — FLUCONAZOLE 100 MG PO TABS
200.0000 mg | ORAL_TABLET | Freq: Once | ORAL | Status: AC
  Administered 2017-10-18: 200 mg via ORAL
  Filled 2017-10-18: qty 2

## 2017-10-18 MED ORDER — NYSTATIN-TRIAMCINOLONE 100000-0.1 UNIT/GM-% EX CREA: TOPICAL_CREAM | CUTANEOUS | 0 refills | Status: DC

## 2017-10-18 MED ORDER — FLUCONAZOLE 200 MG PO TABS: 200.0000 mg | ORAL_TABLET | Freq: Every day | ORAL | 0 refills | Status: AC

## 2017-10-18 NOTE — ED Notes (Signed)
Urine culture sent down with urine sample 

## 2017-10-18 NOTE — ED Triage Notes (Signed)
Pt reports some white vaginal discharge that started over a week ago. Pt reports she tried monistat and cream at home with no relief. Pt reports pelvic pain 6/10. Pt reports burning with urination as well.

## 2017-10-18 NOTE — ED Provider Notes (Signed)
[facility] EMERGENCY DEPARTMENT Provider Note   CSN: [id number] Arrival date & time: 10/18/17  2023     History   Chief Complaint Chief Complaint  Patient presents with  . Vaginal Discharge    HPI [name] [name] is a 32 y.o. [phone] with hx of DM, HTN and BTl who presents to the ED with vaginal d/c. Current sex partner x 10 years. Patient reports that the d/c started over a week ago. Patient states she tried monistat without relief. Patient also c/o pelvic pain 6/10 and dysuria. Patient came in a couple weeks ago but did not stay for evaluation.   The history is provided by the patient. No language interpreter was used.  Vaginal Discharge   This is a new problem. The current episode started more than 1 week ago. The problem occurs constantly. The problem has been gradually worsening. The discharge occurs spontaneously. The discharge was white. She is not pregnant. She has not missed her period. Associated symptoms include abdominal pain, dysuria and genital itching. Pertinent negatives include no fever, no diarrhea, no nausea, no vomiting, no frequency and no perineal odor.    Past Medical History:  Diagnosis Date  . Abnormal Pap smear   . Diabetes mellitus    type II  . GBS (group B streptococcus) UTI complicating pregnancy   . Herpes simplex without mention of complication   . Hypertension   . Irregular menses   . Obesity     Patient Active Problem List   Diagnosis Date Noted  . Chest pain 05/20/2017  . History of tubal ligation   . Medication refill 07/22/2016  . Vaginal discharge 05/16/2016  . Constipation 03/04/2016  . Yeast infection 02/11/2016  . Alopecia areata 09/29/2013  . Type II diabetes mellitus, uncontrolled (HCC) 09/20/2007  . 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 Obesity 07/09/2006  . RHINITIS, ALLERGIC 07/09/2006  . PAPANICOLAOU SMEAR, ABNORMAL 07/09/2006    Past Surgical History:  Procedure Laterality Date  .  CESAREAN SECTION  01/30/2011   Procedure: CESAREAN SECTION;  Surgeon: Tilda Burrow, MD;  Location: [facility];  Service: Gynecology;  Laterality: N/A;  . CESAREAN SECTION N/A 10/22/2014   Procedure: CESAREAN SECTION;  Surgeon: Reva Bores, MD;  Location: [facility];  Service: Obstetrics;  Laterality: N/A;  . TUBAL LIGATION    . WISDOM TOOTH EXTRACTION       OB History    Gravida  4   Para  2   Term  1   Preterm  1   AB  2   Living  2     SAB  2   TAB      Ectopic      Multiple  0   Live Births  2            Home Medications    Prior to Admission medications   Medication Sig Start Date End Date Taking? Authorizing Provider  acetaminophen (TYLENOL) 500 MG tablet Take 1,000 mg by mouth every 6 (six) hours as needed for mild pain.    [provider]  cephALEXin (KEFLEX) 500 MG capsule Take 1 capsule (500 mg total) by mouth 2 (two) times daily. 07/21/17   Petrucelli, Samantha R, PA-C  fluconazole (DIFLUCAN) 200 MG tablet Take 1 tablet (200 mg total) by mouth daily for 3 days. 10/18/17 10/21/17  Janne Napoleon, NP  glipiZIDE (GLUCOTROL) 10 MG tablet Take 1 tablet (10 mg total) by mouth daily before  breakfast. 05/20/17   Freddrick March, MD  metFORMIN (GLUCOPHAGE) 500 MG tablet Take 1 tablet (500 mg total) by mouth 2 (two) times daily with a meal. 07/31/17   Freddrick March, MD  nystatin-triamcinolone [employer] [employer] II) cream Apply to affected area daily 10/18/17   Janne Napoleon, NP    Family History Family History  Problem Relation Age of Onset  . Diabetes Paternal Grandmother   . Hypertension Paternal Grandmother   . Diabetes Maternal Grandmother   . Diabetes Father   . Cancer Paternal Uncle     Social History Social History   Tobacco Use  . Smoking status: Former Games developer  . Smokeless tobacco: Never Used  Substance Use Topics  . Alcohol use: Yes    Comment: "occassionally"  . Drug use: No     Allergies   Patient has no known allergies.   Review of  Systems Review of Systems  Constitutional: Negative for fever.  HENT: Negative.   Eyes: Negative for visual disturbance.  Respiratory: Negative for cough and shortness of breath.   Cardiovascular: Negative for chest pain.  Gastrointestinal: Positive for abdominal pain. Negative for diarrhea, nausea and vomiting.  Genitourinary: Positive for dysuria, urgency, vaginal discharge and vaginal pain. Negative for frequency and vaginal bleeding.  Musculoskeletal: Negative for back pain.  Skin: Negative for rash.  Neurological: Negative for headaches.  Psychiatric/Behavioral: Negative for confusion.     Physical Exam Updated Vital Signs BP 139/76 (BP Location: Right Arm)   Pulse 85   Temp 99.6 F (37.6 C) (Oral)   Resp 18   Ht 5\' 1"  (1.549 m)   Wt 98.9 kg (218 lb)   LMP 10/11/2017   SpO2 99%   BMI 41.19 kg/m   Physical Exam  Constitutional: She appears well-developed and well-nourished. No distress.  HENT:  Head: Normocephalic and atraumatic.  Eyes: EOM are normal.  Neck: Neck supple.  Cardiovascular: Normal rate and regular rhythm.  Pulmonary/Chest: Effort normal and breath sounds normal.  Abdominal: Soft. Bowel sounds are normal. She exhibits no distension. There is no tenderness.  Genitourinary:  Genitourinary Comments: External genitalia with erythema and skin discoloration of the vulva. Thick white d/c vaginal vault. Cervix without lesions. No CMT, no adnexal tenderness, unable to palpate uterus due to patient habitus.   Musculoskeletal: Normal range of motion.  Neurological: She is alert.  Skin: Skin is warm and dry.  Psychiatric: She has a normal mood and affect. Her behavior is normal.  Nursing note and vitals reviewed.    ED Treatments / Results  Labs (all labs ordered are listed, but only abnormal results are displayed) Labs Reviewed  WET PREP, GENITAL - Abnormal; Notable for the following components:      Result Value   WBC, Wet Prep HPF POC MODERATE (*)     All other components within normal limits  URINALYSIS, ROUTINE W REFLEX MICROSCOPIC - Abnormal; Notable for the following components:   APPearance HAZY (*)    Specific Gravity, Urine 1.032 (*)    Glucose, UA >=500 (*)    Leukocytes, UA TRACE (*)    Bacteria, UA RARE (*)    All other components within normal limits  POC URINE PREG, ED  GC/CHLAMYDIA PROBE AMP ([place]) NOT AT [facility]   Radiology No results found.  Procedures Procedures (including critical care time)  Medications Ordered in ED Medications  fluconazole (DIFLUCAN) tablet 200 mg (200 mg Oral Given 10/18/17 2152)     Initial Impression / Assessment and Plan / ED  Course  I have reviewed the triage vital signs and the nursing notes. 32 y.o. female with DM here tonight with vaginal itching and d/c stable for d/c. Will treat with diflucan and Mycolog cream. [facility] or [facility]CP Final Clinical Impressions(s) / ED Diagnoses   Final diagnoses:  Yeast vaginitis    ED Discharge Orders        Ordered    fluconazole (DIFLUCAN) 200 MG tablet  Daily     10/18/17 2200    nystatin-triamcinolone (MYCOLOG II) cream     10/18/17 2200       Kerrie Buffaloeese, Arcangel Minion Sea RanchM, NP 10/18/17 2203    Charlynne PanderYao, David Hsienta, MD 10/20/17 [phone]

## 2017-10-19 LAB — GC/CHLAMYDIA PROBE AMP (~~LOC~~) NOT AT ARMC
CHLAMYDIA, DNA PROBE: NEGATIVE
Neisseria Gonorrhea: NEGATIVE

## 2017-11-25 ENCOUNTER — Emergency Department (HOSPITAL_COMMUNITY)
Admission: EM | Admit: 2017-11-25 | Discharge: 2017-11-25 | Disposition: A | Discharge status: Home / Self Care | Payer: Self-pay | Attending: Emergency Medicine | Admitting: Emergency Medicine

## 2017-11-25 ENCOUNTER — Encounter (HOSPITAL_COMMUNITY): Payer: Self-pay

## 2017-11-25 ENCOUNTER — Other Ambulatory Visit: Payer: Self-pay

## 2017-11-25 DIAGNOSIS — Z87891 Personal history of nicotine dependence: Secondary | ICD-10-CM | POA: Insufficient documentation

## 2017-11-25 DIAGNOSIS — S61309A Unspecified open wound of unspecified finger with damage to nail, initial encounter: Secondary | ICD-10-CM

## 2017-11-25 DIAGNOSIS — Z7984 Long term (current) use of oral hypoglycemic drugs: Secondary | ICD-10-CM | POA: Insufficient documentation

## 2017-11-25 DIAGNOSIS — E119 Type 2 diabetes mellitus without complications: Secondary | ICD-10-CM | POA: Insufficient documentation

## 2017-11-25 DIAGNOSIS — Y929 Unspecified place or not applicable: Secondary | ICD-10-CM | POA: Insufficient documentation

## 2017-11-25 DIAGNOSIS — S61316A Laceration without foreign body of right little finger with damage to nail, initial encounter: Secondary | ICD-10-CM | POA: Insufficient documentation

## 2017-11-25 DIAGNOSIS — I10 Essential (primary) hypertension: Secondary | ICD-10-CM | POA: Insufficient documentation

## 2017-11-25 DIAGNOSIS — W231XXA Caught, crushed, jammed, or pinched between stationary objects, initial encounter: Secondary | ICD-10-CM | POA: Insufficient documentation

## 2017-11-25 DIAGNOSIS — Y998 Other external cause status: Secondary | ICD-10-CM | POA: Insufficient documentation

## 2017-11-25 DIAGNOSIS — Y9389 Activity, other specified: Secondary | ICD-10-CM | POA: Insufficient documentation

## 2017-11-25 NOTE — Discharge Instructions (Signed)
Your nail is still attached on all sides which is good! There is no need

## 2017-11-25 NOTE — ED Provider Notes (Signed)
[facility] EMERGENCY DEPARTMENT Provider Note  CSN: [id number] Arrival date & time: 11/25/17  1424  History   Chief Complaint Chief Complaint  Patient presents with  . Nail Problem    HPI [name] [name] is a 32 y.o. female with a medical history of Type 2 DM and HSV  who presented to the ED for right nail pain. Patient state she got her right pink nail caught on the door handle. She thinks her nail is separated from the nail bed. There was no laceration or bleeding noted. She endorses distal right pinky pain without redness. Patient currently has ~2 inch acrylic nails.   Past Medical History:  Diagnosis Date  . Abnormal Pap smear   . Diabetes mellitus    type II  . GBS (group B streptococcus) UTI complicating pregnancy   . Herpes simplex without mention of complication   . Hypertension   . Irregular menses   . Obesity     Patient Active Problem List   Diagnosis Date Noted  . Chest pain 05/20/2017  . History of tubal ligation   . Medication refill 07/22/2016  . Vaginal discharge 05/16/2016  . Constipation 03/04/2016  . Yeast infection 02/11/2016  . Alopecia areata 09/29/2013  . Type II diabetes mellitus, uncontrolled (HCC) 09/20/2007  . 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 Obesity 07/09/2006  . RHINITIS, ALLERGIC 07/09/2006  . PAPANICOLAOU SMEAR, ABNORMAL 07/09/2006    Past Surgical History:  Procedure Laterality Date  . CESAREAN SECTION  01/30/2011   Procedure: CESAREAN SECTION;  Surgeon: Tilda BurrowJohn V Ferguson, MD;  Location: [facility];  Service: Gynecology;  Laterality: N/A;  . CESAREAN SECTION N/A 10/22/2014   Procedure: CESAREAN SECTION;  Surgeon: [name] Boresanya S Pratt, MD;  Location: [facility];  Service: Obstetrics;  Laterality: N/A;  . TUBAL LIGATION    . WISDOM TOOTH EXTRACTION       OB History    Gravida  4   Para  2   Term  1   Preterm  1   AB  2   Living  2     SAB  2   TAB      Ectopic      Multiple  0   Live Births  2             Home Medications    Prior to Admission medications   Medication Sig Start Date End Date Taking? Authorizing Provider  acetaminophen (TYLENOL) 500 MG tablet Take 1,000 mg by mouth every 6 (six) hours as needed for mild pain.    [provider]  cephALEXin (KEFLEX) 500 MG capsule Take 1 capsule (500 mg total) by mouth 2 (two) times daily. 07/21/17   Petrucelli, Samantha R, PA-C  glipiZIDE (GLUCOTROL) 10 MG tablet Take 1 tablet (10 mg total) by mouth daily before breakfast. 05/20/17   Freddrick MarchAmin, Yashika, MD  metFORMIN (GLUCOPHAGE) 500 MG tablet Take 1 tablet (500 mg total) by mouth 2 (two) times daily with a meal. 07/31/17   Freddrick MarchAmin, Yashika, MD  nystatin-triamcinolone [facility](MYCOLOG II) cream Apply to affected area daily 10/18/17   Janne NapoleonNeese, Hope M, NP    Family History Family History  Problem Relation Age of Onset  . Diabetes Paternal Grandmother   . Hypertension Paternal Grandmother   . Diabetes Maternal Grandmother   . Diabetes Father   . Cancer Paternal Uncle     Social History Social History   Tobacco Use  . Smoking status: Former Games developermoker  .  Smokeless tobacco: Never Used  Substance Use Topics  . Alcohol use: Yes    Comment: "occassionally"  . Drug use: No     Allergies   Patient has no known allergies.   Review of Systems Review of Systems  Constitutional: Negative.   Musculoskeletal: Negative.   Skin: Negative for color change and wound.       Nail pain on right 5th finger.  Neurological: Negative.   Hematological: Negative.      Physical Exam Updated Vital Signs BP 128/80   Pulse 93   Temp 99.1 F (37.3 C) (Oral)   Resp 16   Ht 5\' 1"  (1.549 m)   Wt 98.4 kg (217 lb)   SpO2 100%   BMI 41.00 kg/m   Physical Exam  Constitutional: Vital signs are normal. She appears well-developed and well-nourished.  Obese.  Cardiovascular:  Pulses:      Radial pulses are 2+ on the right side, and 2+ on the left side.  Musculoskeletal:       Hands: ~ 2 inch  acrylic nails on all 10 digits. Right 5th fingernail is attached to cuticle appropriately. Minor avulsion of top portion of nail.   Skin: Skin is warm and intact. Capillary refill takes 2 to 3 seconds. No abrasion and no laceration noted. No erythema. No pallor.  Nursing note and vitals reviewed.    ED Treatments / Results  Labs (all labs ordered are listed, but only abnormal results are displayed) Labs Reviewed - No data to display  EKG None  Radiology No results found.  Procedures Procedures (including critical care time)  Medications Ordered in ED Medications - No data to display   Initial Impression / Assessment and Plan / ED Course  Triage vital signs and the nursing notes have been reviewed.  Pertinent labs & imaging results that were available during care of the patient were reviewed and considered in medical decision making (see chart for details).   Patient presents well appearing and in no acute distress. Patient's nail is attached to the nail bed and cuticle by nail folds. There is minor avulsion of nail edge from hyponychium (~ < 5 mm). No erythema, edema or discharge to suggest infection or paronychia. Risks > benefits from removing nail today.  Final Clinical Impressions(s) / ED Diagnoses  1. Fingernail Avulsion. No need for removal today. Education provided on appropriate nail care and hand hygiene.  Dispo: Home. After thorough clinical evaluation, this patient is determined to be medically stable and can be safely discharged with the previously mentioned treatment and/or outpatient follow-up/referral(s). At this time, there are no other apparent medical conditions that require further screening, evaluation or treatment.   Final diagnoses:  Avulsion of fingernail, initial encounter    ED Discharge Orders    None        [name] [name] 11/25/17 1541    Donnetta Hutching, MD 11/26/17 1042

## 2017-11-25 NOTE — ED Triage Notes (Signed)
Pt c.o right 5th finger nail hanging off, pt got her nail caught while opening a door, nail hanging on by cuticle, acrylic nail on top of pts real nail. No bleeding noted.

## 2017-12-14 ENCOUNTER — Ambulatory Visit: Payer: Self-pay | Admitting: Family Medicine

## 2018-01-14 ENCOUNTER — Encounter (HOSPITAL_COMMUNITY): Payer: Self-pay | Admitting: Emergency Medicine

## 2018-01-14 ENCOUNTER — Other Ambulatory Visit: Payer: Self-pay

## 2018-01-14 ENCOUNTER — Emergency Department (HOSPITAL_COMMUNITY)
Admission: EM | Admit: 2018-01-14 | Discharge: 2018-01-14 | Disposition: A | Discharge status: Home / Self Care | Payer: Self-pay | Attending: Emergency Medicine | Admitting: Emergency Medicine

## 2018-01-14 DIAGNOSIS — R300 Dysuria: Secondary | ICD-10-CM | POA: Insufficient documentation

## 2018-01-14 DIAGNOSIS — Z7984 Long term (current) use of oral hypoglycemic drugs: Secondary | ICD-10-CM | POA: Insufficient documentation

## 2018-01-14 DIAGNOSIS — Z87891 Personal history of nicotine dependence: Secondary | ICD-10-CM | POA: Insufficient documentation

## 2018-01-14 DIAGNOSIS — N3000 Acute cystitis without hematuria: Secondary | ICD-10-CM | POA: Insufficient documentation

## 2018-01-14 DIAGNOSIS — B379 Candidiasis, unspecified: Secondary | ICD-10-CM | POA: Insufficient documentation

## 2018-01-14 DIAGNOSIS — N760 Acute vaginitis: Secondary | ICD-10-CM | POA: Insufficient documentation

## 2018-01-14 DIAGNOSIS — B9689 Other specified bacterial agents as the cause of diseases classified elsewhere: Secondary | ICD-10-CM | POA: Insufficient documentation

## 2018-01-14 DIAGNOSIS — I10 Essential (primary) hypertension: Secondary | ICD-10-CM | POA: Insufficient documentation

## 2018-01-14 DIAGNOSIS — E119 Type 2 diabetes mellitus without complications: Secondary | ICD-10-CM | POA: Insufficient documentation

## 2018-01-14 LAB — URINALYSIS, MICROSCOPIC (REFLEX)

## 2018-01-14 LAB — WET PREP, GENITAL
SPERM: NONE SEEN
TRICH WET PREP: NONE SEEN

## 2018-01-14 LAB — URINALYSIS, ROUTINE W REFLEX MICROSCOPIC
BILIRUBIN URINE: NEGATIVE
KETONES UR: 15 mg/dL — AB
NITRITE: NEGATIVE
PH: 6.5 (ref 5.0–8.0)
Protein, ur: NEGATIVE mg/dL
Specific Gravity, Urine: 1.015 (ref 1.005–1.030)

## 2018-01-14 LAB — POC URINE PREG, ED: Preg Test, Ur: NEGATIVE

## 2018-01-14 MED ORDER — CLOTRIMAZOLE 1 % VA CREA: 1.0000 | TOPICAL_CREAM | Freq: Every day | VAGINAL | 0 refills | Status: AC

## 2018-01-14 MED ORDER — METRONIDAZOLE 500 MG PO TABS: 500.0000 mg | ORAL_TABLET | Freq: Two times a day (BID) | ORAL | 0 refills | Status: DC

## 2018-01-14 MED ORDER — FLUCONAZOLE 150 MG PO TABS
150.0000 mg | ORAL_TABLET | Freq: Once | ORAL | Status: AC
  Administered 2018-01-14: 150 mg via ORAL
  Filled 2018-01-14 (×2): qty 1

## 2018-01-14 MED ORDER — CEPHALEXIN 500 MG PO CAPS: 500.0000 mg | ORAL_CAPSULE | Freq: Two times a day (BID) | ORAL | 0 refills | Status: AC

## 2018-01-14 NOTE — ED Provider Notes (Signed)
[facility] EMERGENCY DEPARTMENT Provider Note   CSN: [id number] Arrival date & time: 01/14/18  1914     History   Chief Complaint Chief Complaint  Patient presents with  . Vaginitis    HPI [name] [name] is a 32 y.o. female.  The history is provided by the patient, a significant other and medical records. No language interpreter was used.  Vaginal Discharge   This is a recurrent problem. The current episode started yesterday. The problem occurs constantly. The problem has not changed since onset.The discharge occurs spontaneously. The discharge was white. She is not pregnant. Associated symptoms include dysuria, genital burning and genital itching. Pertinent negatives include no diaphoresis, no abdominal pain, no diarrhea, no nausea, no vomiting and no frequency. She has tried nothing for the symptoms. The treatment provided no relief.    Past Medical History:  Diagnosis Date  . Abnormal Pap smear   . Diabetes mellitus    type II  . GBS (group B streptococcus) UTI complicating pregnancy   . Herpes simplex without mention of complication   . Hypertension   . Irregular menses   . Obesity     Patient Active Problem List   Diagnosis Date Noted  . Chest pain 05/20/2017  . History of tubal ligation   . Medication refill 07/22/2016  . Vaginal discharge 05/16/2016  . Constipation 03/04/2016  . Yeast infection 02/11/2016  . Alopecia areata 09/29/2013  . Type II diabetes mellitus, uncontrolled (HCC) 09/20/2007  . 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 Obesity 07/09/2006  . RHINITIS, ALLERGIC 07/09/2006  . PAPANICOLAOU SMEAR, ABNORMAL 07/09/2006    Past Surgical History:  Procedure Laterality Date  . CESAREAN SECTION  01/30/2011   Procedure: CESAREAN SECTION;  Surgeon: Tilda Burrow, MD;  Location: [facility];  Service: Gynecology;  Laterality: N/A;  . CESAREAN SECTION N/A 10/22/2014   Procedure: CESAREAN SECTION;  Surgeon: Reva Bores, MD;   Location: [facility];  Service: Obstetrics;  Laterality: N/A;  . TUBAL LIGATION    . WISDOM TOOTH EXTRACTION       OB History    Gravida  4   Para  2   Term  1   Preterm  1   AB  2   Living  2     SAB  2   TAB      Ectopic      Multiple  0   Live Births  2            Home Medications    Prior to Admission medications   Medication Sig Start Date End Date Taking? Authorizing Provider  acetaminophen (TYLENOL) 500 MG tablet Take 1,000 mg by mouth every 6 (six) hours as needed for mild pain.    [provider]  cephALEXin (KEFLEX) 500 MG capsule Take 1 capsule (500 mg total) by mouth 2 (two) times daily. 07/21/17   Petrucelli, Samantha R, PA-C  glipiZIDE (GLUCOTROL) 10 MG tablet Take 1 tablet (10 mg total) by mouth daily before breakfast. 05/20/17   Freddrick March, MD  metFORMIN (GLUCOPHAGE) 500 MG tablet Take 1 tablet (500 mg total) by mouth 2 (two) times daily with a meal. 07/31/17   Freddrick March, MD  nystatin-triamcinolone [facility] II) cream Apply to affected area daily 10/18/17   Janne Napoleon, NP    Family History Family History  Problem Relation Age of Onset  . Diabetes Paternal Grandmother   . Hypertension Paternal Grandmother   .  Diabetes Maternal Grandmother   . Diabetes Father   . Cancer Paternal Uncle     Social History Social History   Tobacco Use  . Smoking status: Former Games developer  . Smokeless tobacco: Never Used  Substance Use Topics  . Alcohol use: Yes    Comment: "occassionally"  . Drug use: No     Allergies   Patient has no known allergies.   Review of Systems Review of Systems  Constitutional: Negative for chills, diaphoresis and fatigue.  HENT: Negative for congestion.   Eyes: Negative for visual disturbance.  Respiratory: Negative for chest tightness and shortness of breath.   Cardiovascular: Negative for chest pain.  Gastrointestinal: Negative for abdominal pain, diarrhea, nausea and vomiting.  Genitourinary: Positive for  dysuria and vaginal discharge. Negative for flank pain, frequency, vaginal bleeding and vaginal pain.  Musculoskeletal: Negative for back pain, neck pain and neck stiffness.  Skin: Negative for rash and wound.  Neurological: Negative for headaches.  Psychiatric/Behavioral: Negative for agitation.  All other systems reviewed and are negative.    Physical Exam Updated Vital Signs BP (!) 118/92 (BP Location: Right Arm)   Pulse 94   Temp 99 F (37.2 C) (Oral)   Resp 18   Ht 5\' 1"  (1.549 m)   Wt 99.8 kg   LMP 01/01/2018 (Exact Date)   SpO2 100%   BMI 41.57 kg/m   Physical Exam  Constitutional: She is oriented to person, place, and time. She appears well-developed and well-nourished. No distress.  HENT:  Head: Normocephalic and atraumatic.  Eyes: Conjunctivae are normal.  Neck: Neck supple.  Cardiovascular: Normal rate and regular rhythm.  No murmur heard. Pulmonary/Chest: Effort normal and breath sounds normal. No respiratory distress. She has no wheezes. She has no rales.  Abdominal: Soft. She exhibits no distension. There is no tenderness.  Genitourinary: Cervix exhibits discharge. Cervix exhibits no motion tenderness and no friability. Right adnexum displays no tenderness. Left adnexum displays no tenderness. No erythema or tenderness in the vagina. No foreign body in the vagina. Vaginal discharge found.    Musculoskeletal: She exhibits no edema or tenderness.  Neurological: She is alert and oriented to person, place, and time.  Skin: Skin is warm and dry. Capillary refill takes less than 2 seconds. No rash noted. She is not diaphoretic. No erythema.  Psychiatric: She has a normal mood and affect.  Nursing note and vitals reviewed.    ED Treatments / Results  Labs (all labs ordered are listed, but only abnormal results are displayed) Labs Reviewed  WET PREP, GENITAL - Abnormal; Notable for the following components:      Result Value   Yeast Wet Prep HPF POC PRESENT (*)     Clue Cells Wet Prep HPF POC PRESENT (*)    WBC, Wet Prep HPF POC MANY (*)    All other components within normal limits  URINALYSIS, ROUTINE W REFLEX MICROSCOPIC - Abnormal; Notable for the following components:   APPearance CLOUDY (*)    Glucose, UA >=500 (*)    Hgb urine dipstick TRACE (*)    Ketones, ur 15 (*)    Leukocytes, UA MODERATE (*)    All other components within normal limits  URINALYSIS, MICROSCOPIC (REFLEX) - Abnormal; Notable for the following components:   Bacteria, UA MANY (*)    All other components within normal limits  URINE CULTURE  POC URINE PREG, ED  WET PREP  (BD AFFIRM) ([place])  GC/CHLAMYDIA PROBE AMP ([place]) NOT AT [facility]  EKG None  Radiology No results found.  Procedures Procedures (including critical care time)  Medications Ordered in ED Medications - No data to display   Initial Impression / Assessment and Plan / ED Course  I have reviewed the triage vital signs and the nursing notes.  Pertinent labs & imaging results that were available during my care of the patient were reviewed by me and considered in my medical decision making (see chart for details).     Lekeisha D Frailey is a 32 y.o. female with a past medical history significant for hypertension, diabetes, obesity, and recurrent yeast infections who presents with dysuria, vaginal discharge, and vaginal irritation.  She feels she likely has a recurrent yeast infection.  She thinks that she does have dysuria but does not remember ever having a UTI.  She denies any abdominal pain or other pelvic pain.  She denies recent trauma.  She denies fevers, chills, chest pain, shortness breath, nausea, vomiting, constipation, or diarrhea.  She denies other complaints on arrival.  On exam, abdomen is nontender.  Lungs are clear.  Back is nontender.  No CVA tenderness.    Pelvic exam will be performed and swabs will be sent.  Urinalysis will also be sent due to possible UTI given the  dysuria.  Next  Anticipate reassessment on laboratory testing and work-up.  11:26 AM Pelvic exam revealed a thick white discharge in the vaginal vault and on the cervix.  No cervical motion tenderness or adnexal tenderness.    Laboratory testing confirmed UTI, BV, and yeast infection.  Patient will be given prescription for Flagyl for BV, Keflex for UTI, and applicators of clotrimazole for the yeast as she reports oral fluconazole has not helped her in the past.  Next  Patient will follow-up with women's and PCP.  Patient should return precautions and was discharged in good condition.   Final Clinical Impressions(s) / ED Diagnoses   Final diagnoses:  Bacterial vaginosis  Yeast infection  Acute cystitis without hematuria    ED Discharge Orders         Ordered    metroNIDAZOLE (FLAGYL) 500 MG tablet  2 times daily     01/14/18 1123    cephALEXin (KEFLEX) 500 MG capsule  2 times daily     01/14/18 1123    clotrimazole (GYNE-LOTRIMIN) 1 % vaginal cream  Daily at bedtime     01/14/18 1123         Clinical Impression: 1. Bacterial vaginosis   2. Yeast infection   3. Acute cystitis without hematuria     Disposition: Discharge  Condition: Good  I have discussed the results, Dx and Tx plan with the pt(& family if present). He/she/they expressed understanding and agree(s) with the plan. Discharge instructions discussed at great length. Strict return precautions discussed and pt &/or family have verbalized understanding of the instructions. No further questions at time of discharge.    New Prescriptions   CEPHALEXIN (KEFLEX) 500 MG CAPSULE    Take 1 capsule (500 mg total) by mouth 2 (two) times daily for 7 days.   CLOTRIMAZOLE (GYNE-LOTRIMIN) 1 % VAGINAL CREAM    Place 1 Applicatorful vaginally at bedtime for 7 days.   METRONIDAZOLE (FLAGYL) 500 MG TABLET    Take 1 tablet (500 mg total) by mouth 2 (two) times daily.    Follow Up: [facility] [address] [place] [address] [phone] Schedule an appointment as soon as possible for a visit  [facility] [address] [place] [address] [phone] Schedule an appointment as soon as possible for a visit    [facility] EMERGENCY DEPARTMENT [address] [id number] mc [place] [address] [phone]       Tegeler, Canary Brim, MD 01/14/18 [phone]

## 2018-01-14 NOTE — Discharge Instructions (Signed)
Your exam and lab work today was consistent with a urinary tract infection, yeast infection, and bacterial vaginosis.  Please take the antibiotics we have prescribed for the next week as directed.  If any symptoms change or worsen, please return to the nearest emergency department.

## 2018-01-14 NOTE — ED Triage Notes (Signed)
Pt reports vaginal discharge, itching and burning for 3 days. States she has not used any otc meds because they typically do not work for her.

## 2018-01-15 LAB — GC/CHLAMYDIA PROBE AMP (~~LOC~~) NOT AT ARMC
Chlamydia: NEGATIVE
NEISSERIA GONORRHEA: NEGATIVE

## 2018-01-16 LAB — URINE CULTURE

## 2018-01-17 ENCOUNTER — Telehealth: Payer: Self-pay

## 2018-01-17 NOTE — Telephone Encounter (Signed)
Post ED Visit - Positive Culture Follow-up  Culture report reviewed by antimicrobial stewardship pharmacist:  []  Enzo Bi, Pharm.D. []  Celedonio Miyamoto, Pharm.D., BCPS AQ-ID []  Garvin Fila, Pharm.D., BCPS []  Georgina Pillion, Pharm.D., BCPS []  [place], [address].D., BCPS, AAHIVP []  Estella Husk, Pharm.D., BCPS, AAHIVP []  Lysle Pearl, PharmD, BCPS []  Phillips Climes, PharmD, BCPS [x]  Agapito Games, PharmD, BCPS []  Verlan Friends, PharmD  Positive urine culture Treated with Cephalexin, organism sensitive to the same and no further patient follow-up is required at this time.  Jerry Caras 01/17/2018, 3:57 PM

## 2018-01-31 ENCOUNTER — Emergency Department (HOSPITAL_COMMUNITY)
Admission: EM | Admit: 2018-01-31 | Discharge: 2018-01-31 | Disposition: A | Discharge status: Home / Self Care | Payer: Self-pay | Attending: Emergency Medicine | Admitting: Emergency Medicine

## 2018-01-31 ENCOUNTER — Other Ambulatory Visit: Payer: Self-pay

## 2018-01-31 DIAGNOSIS — I10 Essential (primary) hypertension: Secondary | ICD-10-CM | POA: Insufficient documentation

## 2018-01-31 DIAGNOSIS — B9689 Other specified bacterial agents as the cause of diseases classified elsewhere: Secondary | ICD-10-CM

## 2018-01-31 DIAGNOSIS — Z87891 Personal history of nicotine dependence: Secondary | ICD-10-CM | POA: Insufficient documentation

## 2018-01-31 DIAGNOSIS — B373 Candidiasis of vulva and vagina: Secondary | ICD-10-CM | POA: Insufficient documentation

## 2018-01-31 DIAGNOSIS — N760 Acute vaginitis: Secondary | ICD-10-CM | POA: Insufficient documentation

## 2018-01-31 DIAGNOSIS — Z7984 Long term (current) use of oral hypoglycemic drugs: Secondary | ICD-10-CM | POA: Insufficient documentation

## 2018-01-31 DIAGNOSIS — B3731 Acute candidiasis of vulva and vagina: Secondary | ICD-10-CM

## 2018-01-31 DIAGNOSIS — E119 Type 2 diabetes mellitus without complications: Secondary | ICD-10-CM | POA: Insufficient documentation

## 2018-01-31 LAB — WET PREP, GENITAL
SPERM: NONE SEEN
TRICH WET PREP: NONE SEEN

## 2018-01-31 LAB — POC URINE PREG, ED: Preg Test, Ur: NEGATIVE

## 2018-01-31 MED ORDER — NYSTATIN 100000 UNIT/GM EX CREA: 1.0000 "application " | TOPICAL_CREAM | Freq: Two times a day (BID) | CUTANEOUS | 0 refills | Status: DC

## 2018-01-31 MED ORDER — FLUCONAZOLE 100 MG PO TABS
200.0000 mg | ORAL_TABLET | Freq: Once | ORAL | Status: AC
  Administered 2018-01-31: 200 mg via ORAL
  Filled 2018-01-31: qty 2

## 2018-01-31 MED ORDER — FLUCONAZOLE 150 MG PO TABS: 150.0000 mg | ORAL_TABLET | ORAL | 0 refills | Status: DC

## 2018-01-31 MED ORDER — NYSTATIN 100000 UNIT/GM EX OINT
TOPICAL_OINTMENT | Freq: Two times a day (BID) | CUTANEOUS | Status: DC
  Filled 2018-01-31: qty 15

## 2018-01-31 NOTE — ED Triage Notes (Signed)
Patient states was seen here a couple of weeks ago for same; took all the meds but is having no relief. Patient is in tears in triage.

## 2018-01-31 NOTE — Discharge Instructions (Addendum)
1. Medications: diflucan, nystatin cream, usual home medications 2. Treatment: rest, drink plenty of fluids,  3. Follow Up: Please followup with OB/GYN in 3-5 days for discussion of your diagnoses and further evaluation after today's visit; if you do not have a primary care doctor use the resource guide provided to find one; Please return to the ER for worsening symptoms, fever, vomiting or other concerns

## 2018-01-31 NOTE — ED Provider Notes (Addendum)
[facility] EMERGENCY DEPARTMENT Provider Note   CSN: [id number] Arrival date & time: 01/31/18  4098     History   Chief Complaint Chief Complaint  Patient presents with  . Vaginitis    HPI [name] [name] is a 32 y.o. female with a hx of NIDDM, genital herpes, HTN, irregular menses presents to the Emergency Department complaining of gradual, persistent, progressively worsening vaginal irritation and itching onset 3 weeks ago. Pt has associated what  Pt reports she was treated for BV and yeast vaginitis at that time with Flagyl, Clotrimazole and keflex (for UTI).  She reports compliance with the medications, finishing them yesterday.  Pt reports her symptoms are persistent despite treatments.  She reports she is not currently sexually active (not since the onset).  Pt with 1 long term female partner.  No alleviating factors.  Sitting and walking makes them worse.  Pt denies fever, chills, abd pain, N/V/D, dysuria, hematuria.  Pt with hx of c-section and BTL.  Pt also endorses elevated blood sugars in the last several weeks (200s).   The history is provided by the patient and medical records. No language interpreter was used.    Past Medical History:  Diagnosis Date  . Abnormal Pap smear   . Diabetes mellitus    type II  . GBS (group B streptococcus) UTI complicating pregnancy   . Herpes simplex without mention of complication   . Hypertension   . Irregular menses   . Obesity     Patient Active Problem List   Diagnosis Date Noted  . Chest pain 05/20/2017  . History of tubal ligation   . Medication refill 07/22/2016  . Vaginal discharge 05/16/2016  . Constipation 03/04/2016  . Yeast infection 02/11/2016  . Alopecia areata 09/29/2013  . Type II diabetes mellitus, uncontrolled (HCC) 09/20/2007  . 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 Obesity 07/09/2006  . RHINITIS, ALLERGIC 07/09/2006  . PAPANICOLAOU SMEAR, ABNORMAL 07/09/2006    Past  Surgical History:  Procedure Laterality Date  . CESAREAN SECTION  01/30/2011   Procedure: CESAREAN SECTION;  Surgeon: Tilda Burrow, MD;  Location: [facility];  Service: Gynecology;  Laterality: N/A;  . CESAREAN SECTION N/A 10/22/2014   Procedure: CESAREAN SECTION;  Surgeon: Reva Bores, MD;  Location: [facility];  Service: Obstetrics;  Laterality: N/A;  . TUBAL LIGATION    . WISDOM TOOTH EXTRACTION       OB History    Gravida  4   Para  2   Term  1   Preterm  1   AB  2   Living  2     SAB  2   TAB      Ectopic      Multiple  0   Live Births  2            Home Medications    Prior to Admission medications   Medication Sig Start Date End Date Taking? Authorizing Provider  acetaminophen (TYLENOL) 500 MG tablet Take 1,000 mg by mouth every 6 (six) hours as needed for mild pain.    [provider]  fluconazole (DIFLUCAN) 150 MG tablet Take 1 tablet (150 mg total) by mouth every 3 (three) days. 01/31/18   Onesti Bonfiglio, Dahlia Client, PA-C  glipiZIDE (GLUCOTROL) 10 MG tablet Take 1 tablet (10 mg total) by mouth daily before breakfast. 05/20/17   Freddrick March, MD  metFORMIN (GLUCOPHAGE) 500 MG tablet Take 1 tablet (500 mg total) by  mouth 2 (two) times daily with a meal. 07/31/17   Freddrick March, MD  metroNIDAZOLE (FLAGYL) 500 MG tablet Take 1 tablet (500 mg total) by mouth 2 (two) times daily. 01/14/18   Tegeler, Canary Brim, MD  nystatin cream (MYCOSTATIN) Apply 1 application topically 2 (two) times daily. Apply to affected area every 4-6 hours x 10 days 01/31/18   Nathon Stefanski, Dahlia Client, PA-C    Family History Family History  Problem Relation Age of Onset  . Diabetes Paternal Grandmother   . Hypertension Paternal Grandmother   . Diabetes Maternal Grandmother   . Diabetes Father   . Cancer Paternal Uncle     Social History Social History   Tobacco Use  . Smoking status: Former Games developer  . Smokeless tobacco: Never Used  Substance Use Topics  . Alcohol use: Yes     Comment: "occassionally"  . Drug use: No     Allergies   Patient has no known allergies.   Review of Systems Review of Systems  Constitutional: Negative for appetite change, diaphoresis, fatigue, fever and unexpected weight change.  HENT: Negative for mouth sores.   Eyes: Negative for visual disturbance.  Respiratory: Negative for cough, chest tightness, shortness of breath and wheezing.   Cardiovascular: Negative for chest pain.  Gastrointestinal: Negative for abdominal pain, constipation, diarrhea, nausea and vomiting.  Endocrine: Negative for polydipsia, polyphagia and polyuria.  Genitourinary: Positive for vaginal discharge and vaginal pain. Negative for dysuria, frequency, hematuria and urgency.  Musculoskeletal: Negative for back pain and neck stiffness.  Skin: Negative for rash.  Allergic/Immunologic: Negative for immunocompromised state.  Neurological: Negative for syncope, light-headedness and headaches.  Hematological: Does not bruise/bleed easily.  Psychiatric/Behavioral: Negative for sleep disturbance. The patient is not nervous/anxious.      Physical Exam Updated Vital Signs BP (!) 144/102 (BP Location: Right Wrist)   Pulse 77   Temp 98.6 F (37 C) (Oral)   Resp 17   Wt 97.5 kg   LMP 01/24/2018   SpO2 100%   BMI 40.62 kg/m   Physical Exam  Constitutional: She appears well-developed and well-nourished. No distress.  HENT:  Head: Normocephalic and atraumatic.  Eyes: Conjunctivae are normal.  Neck: Normal range of motion.  Cardiovascular: Normal rate, regular rhythm, normal heart sounds and intact distal pulses.  No murmur heard. Pulmonary/Chest: Effort normal and breath sounds normal. No respiratory distress. She has no wheezes.  Abdominal: Soft. Bowel sounds are normal. There is no tenderness. There is no rebound and no guarding. Hernia confirmed negative in the right inguinal area and confirmed negative in the left inguinal area.  Genitourinary: Uterus  normal. No labial fusion. There is no rash, tenderness or lesion on the right labia. There is no rash, tenderness or lesion on the left labia. Uterus is not deviated, not enlarged, not fixed and not tender. Cervix exhibits friability. Cervix exhibits no motion tenderness and no discharge. Right adnexum displays no mass, no tenderness and no fullness. Left adnexum displays no mass, no tenderness and no fullness. No erythema, tenderness or bleeding in the vagina. No foreign body in the vagina. No signs of injury around the vagina. Vaginal discharge (thick, white and brown discharge clinging tio the vaginal walls) found.  Musculoskeletal: Normal range of motion. She exhibits no edema.  Lymphadenopathy:       Right: No inguinal adenopathy present.       Left: No inguinal adenopathy present.  Neurological: She is alert.  Skin: Skin is warm and dry. She is not diaphoretic.  No erythema.  Psychiatric: She has a normal mood and affect.  Nursing note and vitals reviewed.    ED Treatments / Results  Labs (all labs ordered are listed, but only abnormal results are displayed) Labs Reviewed  WET PREP, GENITAL - Abnormal; Notable for the following components:      Result Value   Yeast Wet Prep HPF POC PRESENT (*)    Clue Cells Wet Prep HPF POC PRESENT (*)    WBC, Wet Prep HPF POC MANY (*)    All other components within normal limits  POC URINE PREG, ED  GC/CHLAMYDIA PROBE AMP ([place]     Procedures Procedures (including critical care time)  Medications Ordered in ED Medications  nystatin ointment (MYCOSTATIN) (has no administration in time range)  fluconazole (DIFLUCAN) tablet 200 mg (200 mg Oral Given 01/31/18 0620)     Initial Impression / Assessment and Plan / ED Course  I have reviewed the triage vital signs and the nursing notes.  Pertinent labs & imaging results that were available during my care of the patient were reviewed by me and considered in my medical decision  making (see chart for details).  Clinical Course as of Feb 01 635  Wynelle LinkSun Jan 31, 2018  0621 noted  Yeast Wet Prep HPF POC(!): PRESENT [HM]  0621 Noted  WBC, Wet Prep HPF POC(!): MANY [HM]  [phone] Patient noted to be hypertensive in the emergency department.  No signs of hypertensive urgency.  Discussed with patient the need for close follow-up and management by their primary care physician.   BP(!): 144/102 [HM]    Clinical Course User Index [HM] Reginold Beale, Boyd KerbsHannah, PA-C    Presents to the emergency department with complaints of persistent vaginal discharge and itching for the last 3 weeks.  She is an insulin-dependent diabetic and physical exam is consistent with severe yeast vulvovaginitis with severe excoriations.  She denies dysuria, polyuria, fever, abd pain.  Doubt UTI.  No ulcerations to suggest HSV.  Wet prep with yeast, clue cells and white blood cells present.  Suspect bacterial vaginosis is persistent secondary to persistent yeast.  Patient given Diflucan here in the emergency department will be discharged home with same and nystatin cream.  She will need OB/GYN follow-up.  Final Clinical Impressions(s) / ED Diagnoses   Final diagnoses:  Vaginal yeast infection  BV (bacterial vaginosis)    ED Discharge Orders         Ordered    fluconazole (DIFLUCAN) 150 MG tablet  every 72 hours,   Status:  Discontinued     01/31/18 0626    nystatin cream (MYCOSTATIN)  2 times daily     01/31/18 0630    fluconazole (DIFLUCAN) 150 MG tablet  every 72 hours     01/31/18 0635           Leshae Mcclay, Dahlia ClientHannah, PA-C 01/31/18 0630    Fable Huisman, Dahlia ClientHannah, PA-C 01/31/18 0636    Dione BoozeGlick, David, MD 01/31/18 2243

## 2018-02-01 LAB — GC/CHLAMYDIA PROBE AMP (~~LOC~~) NOT AT ARMC
Chlamydia: NEGATIVE
Neisseria Gonorrhea: NEGATIVE

## 2018-02-19 ENCOUNTER — Ambulatory Visit: Payer: Self-pay | Admitting: Family Medicine

## 2018-02-23 ENCOUNTER — Telehealth: Payer: Self-pay | Admitting: Family Medicine

## 2018-02-23 NOTE — Telephone Encounter (Signed)
Pt had no voicemail set up to leave message.

## 2018-04-20 ENCOUNTER — Emergency Department (HOSPITAL_COMMUNITY)
Admission: EM | Admit: 2018-04-20 | Discharge: 2018-04-20 | Disposition: A | Discharge status: Home / Self Care | Payer: Self-pay | Attending: Emergency Medicine | Admitting: Emergency Medicine

## 2018-04-20 ENCOUNTER — Encounter (HOSPITAL_COMMUNITY): Payer: Self-pay | Admitting: Emergency Medicine

## 2018-04-20 DIAGNOSIS — N898 Other specified noninflammatory disorders of vagina: Secondary | ICD-10-CM | POA: Insufficient documentation

## 2018-04-20 DIAGNOSIS — Z5321 Procedure and treatment not carried out due to patient leaving prior to being seen by health care provider: Secondary | ICD-10-CM | POA: Insufficient documentation

## 2018-04-20 NOTE — ED Notes (Signed)
PT stated they would no longer wait left and returned labels.

## 2018-04-20 NOTE — ED Triage Notes (Signed)
Pt reports vaginal DC X1 day. Pt states she thinks she has a yeast infection and BV. Pt reports recent unprotected sex with her husband.

## 2018-05-06 ENCOUNTER — Encounter (HOSPITAL_COMMUNITY): Payer: Self-pay

## 2018-05-06 ENCOUNTER — Other Ambulatory Visit: Payer: Self-pay

## 2018-05-06 ENCOUNTER — Emergency Department (HOSPITAL_COMMUNITY)
Admission: EM | Admit: 2018-05-06 | Discharge: 2018-05-06 | Disposition: A | Discharge status: Home / Self Care | Payer: Self-pay | Attending: Emergency Medicine | Admitting: Emergency Medicine

## 2018-05-06 DIAGNOSIS — J111 Influenza due to unidentified influenza virus with other respiratory manifestations: Secondary | ICD-10-CM | POA: Insufficient documentation

## 2018-05-06 DIAGNOSIS — Z7984 Long term (current) use of oral hypoglycemic drugs: Secondary | ICD-10-CM | POA: Insufficient documentation

## 2018-05-06 DIAGNOSIS — I10 Essential (primary) hypertension: Secondary | ICD-10-CM | POA: Insufficient documentation

## 2018-05-06 DIAGNOSIS — E119 Type 2 diabetes mellitus without complications: Secondary | ICD-10-CM | POA: Insufficient documentation

## 2018-05-06 DIAGNOSIS — Z79899 Other long term (current) drug therapy: Secondary | ICD-10-CM | POA: Insufficient documentation

## 2018-05-06 DIAGNOSIS — R69 Illness, unspecified: Secondary | ICD-10-CM

## 2018-05-06 DIAGNOSIS — Z87891 Personal history of nicotine dependence: Secondary | ICD-10-CM | POA: Insufficient documentation

## 2018-05-06 LAB — I-STAT CHEM 8, ED
BUN: 5 mg/dL — ABNORMAL LOW (ref 6–20)
Calcium, Ion: 1.15 mmol/L (ref 1.15–1.40)
Chloride: 99 mmol/L (ref 98–111)
Creatinine, Ser: 0.5 mg/dL (ref 0.44–1.00)
Glucose, Bld: 318 mg/dL — ABNORMAL HIGH (ref 70–99)
HCT: 36 % (ref 36.0–46.0)
Hemoglobin: 12.2 g/dL (ref 12.0–15.0)
POTASSIUM: 4.2 mmol/L (ref 3.5–5.1)
Sodium: 133 mmol/L — ABNORMAL LOW (ref 135–145)
TCO2: 26 mmol/L (ref 22–32)

## 2018-05-06 LAB — I-STAT BETA HCG BLOOD, ED (MC, WL, AP ONLY): I-stat hCG, quantitative: <5 m[IU]/mL (ref <5)

## 2018-05-06 LAB — CBG MONITORING, ED: GLUCOSE-CAPILLARY: 316 mg/dL — AB (ref 70–99)

## 2018-05-06 MED ORDER — NAPROXEN 500 MG PO TABS: 500.0000 mg | ORAL_TABLET | Freq: Two times a day (BID) | ORAL | 0 refills | Status: DC

## 2018-05-06 MED ORDER — SODIUM CHLORIDE 0.9 % IV BOLUS
1000.0000 mL | Freq: Once | INTRAVENOUS | Status: AC
  Administered 2018-05-06: 1000 mL via INTRAVENOUS

## 2018-05-06 MED ORDER — ONDANSETRON HCL 4 MG/2ML IJ SOLN
4.0000 mg | Freq: Once | INTRAMUSCULAR | Status: AC
  Administered 2018-05-06: 4 mg via INTRAVENOUS
  Filled 2018-05-06: qty 2

## 2018-05-06 MED ORDER — OSELTAMIVIR PHOSPHATE 75 MG PO CAPS: 75.0000 mg | ORAL_CAPSULE | Freq: Two times a day (BID) | ORAL | 0 refills | Status: DC

## 2018-05-06 MED ORDER — ONDANSETRON 4 MG PO TBDP: 4.0000 mg | ORAL_TABLET | Freq: Once | ORAL | Status: DC

## 2018-05-06 MED ORDER — IBUPROFEN 200 MG PO TABS
600.0000 mg | ORAL_TABLET | Freq: Once | ORAL | Status: AC
  Administered 2018-05-06: 600 mg via ORAL
  Filled 2018-05-06: qty 1

## 2018-05-06 MED ORDER — ONDANSETRON 8 MG PO TBDP: 8.0000 mg | ORAL_TABLET | Freq: Three times a day (TID) | ORAL | 0 refills | Status: DC | PRN

## 2018-05-06 NOTE — Discharge Instructions (Addendum)
Take the Zofran to help with nausea.  The Naprosyn is to help with body aches and fever.  Make sure to drink plenty of fluids.  The Tamiflu is the antiviral medication for influenza.  This medication is not absolutely necessary but may help to reduce the severity of the symptoms.  If you decide to take it do so within the first 48 hours of your symptom onset

## 2018-05-06 NOTE — ED Triage Notes (Signed)
Pt endorses fever, bodyaches and n/v x 1 day, 3 episodes of vomiting. Denies abd or diarrhea.

## 2018-05-06 NOTE — ED Provider Notes (Signed)
[facility]   CSN: [id number] Arrival date & time: 05/06/18  1107     History   Chief Complaint Flu symptoms  HPI [name] [name] is a 32 y.o. female.  HPI Patient presents to the emergency room for evaluation of flu symptoms.  Patient states her niece recently had the flu and [name] [name] was exposed to her.  Patient started having symptoms yesterday with cough and congestion.  [name] [name] has a mild sore throat.  [name] [name] is having diffuse body aches.  [name] [name] is had some nausea and vomiting.  [name] [name] denies any diarrhea.  No chest pain or abdominal pain.  No dysuria.  No urinary frequency.  Patient does have diabetes but [name] [name] has not been able to keep down her medications. Past Medical History:  Diagnosis Date  . Abnormal Pap smear   . Diabetes mellitus    type II  . GBS (group B streptococcus) UTI complicating pregnancy   . Herpes simplex without mention of complication   . Hypertension   . Irregular menses   . Obesity     Patient Active Problem List   Diagnosis Date Noted  . Chest pain 05/20/2017  . History of tubal ligation   . Medication refill 07/22/2016  . Vaginal discharge 05/16/2016  . Constipation 03/04/2016  . Yeast infection 02/11/2016  . Alopecia areata 09/29/2013  . Type II diabetes mellitus, uncontrolled (HCC) 09/20/2007  . 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 Obesity 07/09/2006  . RHINITIS, ALLERGIC 07/09/2006  . PAPANICOLAOU SMEAR, ABNORMAL 07/09/2006    Past Surgical History:  Procedure Laterality Date  . CESAREAN SECTION  01/30/2011   Procedure: CESAREAN SECTION;  Surgeon: Tilda BurrowJohn V Ferguson, MD;  Location: [facility];  Service: Gynecology;  Laterality: N/A;  . CESAREAN SECTION N/A 10/22/2014   Procedure: CESAREAN SECTION;  Surgeon: Reva Boresanya S Pratt, MD;  Location: [facility];  Service: Obstetrics;  Laterality: N/A;  . TUBAL LIGATION    . WISDOM TOOTH EXTRACTION       OB History    Gravida  4   Para  2   Term  1    Preterm  1   AB  2   Living  2     SAB  2   TAB      Ectopic      Multiple  0   Live Births  2            Home Medications    Prior to Admission medications   Medication Sig Start Date End Date Taking? Authorizing Provider  acetaminophen (TYLENOL) 500 MG tablet Take 1,000 mg by mouth every 6 (six) hours as needed for mild pain.   Yes [provider]  glipiZIDE (GLUCOTROL) 10 MG tablet Take 1 tablet (10 mg total) by mouth daily before breakfast. 05/20/17  Yes Freddrick MarchAmin, Yashika, MD  metFORMIN (GLUCOPHAGE) 500 MG tablet Take 1 tablet (500 mg total) by mouth 2 (two) times daily with a meal. 07/31/17  Yes Freddrick MarchAmin, Yashika, MD  naproxen (NAPROSYN) 500 MG tablet Take 1 tablet (500 mg total) by mouth 2 (two) times daily with a meal. As needed for pain 05/06/18   Linwood DibblesKnapp, Roshawna Colclasure, MD  ondansetron (ZOFRAN ODT) 8 MG disintegrating tablet Take 1 tablet (8 mg total) by mouth every 8 (eight) hours as needed for nausea or vomiting. 05/06/18   Linwood DibblesKnapp, Athina Fahey, MD  oseltamivir (TAMIFLU) 75 MG capsule Take 1 capsule (75 mg total) by mouth 2 (two) times  daily. 05/06/18   Linwood Dibbles, MD    Family History Family History  Problem Relation Age of Onset  . Diabetes Paternal Grandmother   . Hypertension Paternal Grandmother   . Diabetes Maternal Grandmother   . Diabetes Father   . Cancer Paternal Uncle     Social History Social History   Tobacco Use  . Smoking status: Former Games developer  . Smokeless tobacco: Never Used  Substance Use Topics  . Alcohol use: Yes    Comment: "occassionally"  . Drug use: No     Allergies   Patient has no known allergies.   Review of Systems Review of Systems  All other systems reviewed and are negative.    Physical Exam Updated Vital Signs There were no vitals taken for this visit.  Physical Exam Vitals signs and nursing note reviewed.  Constitutional:      General: [name] [name] is not in acute distress.    Appearance: [name] [name] is well-developed. [name] [name] is not  toxic-appearing.  HENT:     Head: Normocephalic and atraumatic.     Right Ear: External ear normal.     Left Ear: External ear normal.     Nose: Congestion present.     Mouth/Throat:     Mouth: Mucous membranes are moist.     Pharynx: No oropharyngeal exudate or posterior oropharyngeal erythema.  Eyes:     General: No scleral icterus.       Right eye: No discharge.        Left eye: No discharge.     Conjunctiva/sclera: Conjunctivae normal.  Neck:     Musculoskeletal: Neck supple.     Trachea: No tracheal deviation.  Cardiovascular:     Rate and Rhythm: Normal rate and regular rhythm.  Pulmonary:     Effort: Pulmonary effort is normal. No respiratory distress.     Breath sounds: Normal breath sounds. No stridor. No wheezing or rales.  Abdominal:     General: Bowel sounds are normal. There is no distension.     Palpations: Abdomen is soft.     Tenderness: There is no abdominal tenderness. There is no guarding or rebound.  Musculoskeletal:        General: No tenderness.  Skin:    General: Skin is warm and dry.     Findings: No rash.  Neurological:     Mental Status: [name] [name] is alert.     Cranial Nerves: No cranial nerve deficit (no facial droop, extraocular movements intact, no slurred speech).     Sensory: No sensory deficit.     Motor: No abnormal muscle tone or seizure activity.     Coordination: Coordination normal.      ED Treatments / Results  Labs (all labs ordered are listed, but only abnormal results are displayed) Labs Reviewed  CBG MONITORING, ED - Abnormal; Notable for the following components:      Result Value   Glucose-Capillary 316 (*)    All other components within normal limits  I-STAT CHEM 8, ED - Abnormal; Notable for the following components:   Sodium 133 (*)    BUN 5 (*)    Glucose, Bld 318 (*)    All other components within normal limits  I-STAT BETA HCG BLOOD, ED ([facility], [facility], [facility] ONLY)    Procedures Procedures (including critical care  time)  Medications Ordered in ED Medications  ibuprofen (ADVIL,MOTRIN) tablet 600 mg (600 mg Oral Given 05/06/18 1219)  sodium chloride 0.9 % bolus 1,000 mL (0 mLs Intravenous Stopped  05/06/18 1409)  ondansetron (ZOFRAN) injection 4 mg (4 mg Intravenous Given 05/06/18 1219)     Initial Impression / Assessment and Plan / ED Course  I have reviewed the triage vital signs and the nursing notes.  Pertinent labs & imaging results that were available during my care of the patient were reviewed by me and considered in my medical decision making (see chart for details).   Patient presented to the emergency room with symptoms concerning for an influenza-like illness.  Patient had cough, body aches and chills.  Lungs are clear.  I do not suspect pneumonia.  Patient does have diabetes.  Electrolytes do not show any evidence of DKA.  Patient is hyperglycemic but [name] [name] has not taken her medications today.  Patient improved with IV fluids and antiemetics.  Plan on discharge home with prescription of Zofran and Naprosyn.  I will give her a prescription for Tamiflu to hold on to although I explained her that this medication is not necessary .  Patient plans on not taking the medication but I told her I would give her a prescription in case [name] [name] changed her mind within the first 24 hours  Final Clinical Impressions(s) / ED Diagnoses   Final diagnoses:  Influenza-like illness    ED Discharge Orders         Ordered    ondansetron (ZOFRAN ODT) 8 MG disintegrating tablet  Every 8 hours PRN     05/06/18 1408    naproxen (NAPROSYN) 500 MG tablet  2 times daily with meals     05/06/18 1408    oseltamivir (TAMIFLU) 75 MG capsule  2 times daily     05/06/18 1408           Linwood DibblesKnapp, Lusia Greis, MD 05/06/18 1410

## 2018-05-15 ENCOUNTER — Encounter (HOSPITAL_COMMUNITY): Payer: Self-pay

## 2018-05-15 ENCOUNTER — Emergency Department (HOSPITAL_COMMUNITY)
Admission: EM | Admit: 2018-05-15 | Discharge: 2018-05-16 | Disposition: A | Discharge status: Home / Self Care | Payer: Self-pay | Attending: Emergency Medicine | Admitting: Emergency Medicine

## 2018-05-15 DIAGNOSIS — Z7984 Long term (current) use of oral hypoglycemic drugs: Secondary | ICD-10-CM | POA: Insufficient documentation

## 2018-05-15 DIAGNOSIS — Z87891 Personal history of nicotine dependence: Secondary | ICD-10-CM | POA: Insufficient documentation

## 2018-05-15 DIAGNOSIS — B3731 Acute candidiasis of vulva and vagina: Secondary | ICD-10-CM

## 2018-05-15 DIAGNOSIS — I10 Essential (primary) hypertension: Secondary | ICD-10-CM | POA: Insufficient documentation

## 2018-05-15 DIAGNOSIS — Z79899 Other long term (current) drug therapy: Secondary | ICD-10-CM | POA: Insufficient documentation

## 2018-05-15 DIAGNOSIS — N3000 Acute cystitis without hematuria: Secondary | ICD-10-CM

## 2018-05-15 DIAGNOSIS — B373 Candidiasis of vulva and vagina: Secondary | ICD-10-CM | POA: Insufficient documentation

## 2018-05-15 DIAGNOSIS — E119 Type 2 diabetes mellitus without complications: Secondary | ICD-10-CM | POA: Insufficient documentation

## 2018-05-15 LAB — POC URINE PREG, ED: Preg Test, Ur: NEGATIVE

## 2018-05-15 MED ORDER — FLUCONAZOLE 100 MG PO TABS
200.0000 mg | ORAL_TABLET | Freq: Once | ORAL | Status: AC
  Administered 2018-05-15: 200 mg via ORAL
  Filled 2018-05-15: qty 2

## 2018-05-15 MED ORDER — NYSTATIN 100000 UNIT/GM EX CREA
TOPICAL_CREAM | Freq: Once | CUTANEOUS | Status: AC
  Administered 2018-05-16: 01:00:00 via TOPICAL
  Filled 2018-05-15: qty 15

## 2018-05-15 NOTE — ED Triage Notes (Signed)
Onset several days ago vaginal itching, pain, burning, discharge- thick white.  Pt states pain is unbearable.  Pt had recent flu and took Tamiflu.

## 2018-05-15 NOTE — ED Notes (Signed)
Pelvic cart set up at bedside  

## 2018-05-16 LAB — URINALYSIS, ROUTINE W REFLEX MICROSCOPIC
Bilirubin Urine: NEGATIVE
Glucose, UA: 50 mg/dL — AB
Ketones, ur: 20 mg/dL — AB
Nitrite: POSITIVE — AB
Protein, ur: NEGATIVE mg/dL
Specific Gravity, Urine: 1.009 (ref 1.005–1.030)
WBC, UA: >50 WBC/hpf — ABNORMAL HIGH (ref 0–5)
pH: 6 (ref 5.0–8.0)

## 2018-05-16 LAB — WET PREP, GENITAL
Sperm: NONE SEEN
Trich, Wet Prep: NONE SEEN
Yeast Wet Prep HPF POC: NONE SEEN

## 2018-05-16 LAB — CBG MONITORING, ED: Glucose-Capillary: 264 mg/dL — ABNORMAL HIGH (ref 70–99)

## 2018-05-16 MED ORDER — CEPHALEXIN 500 MG PO CAPS: 500.0000 mg | ORAL_CAPSULE | Freq: Four times a day (QID) | ORAL | 0 refills | Status: DC

## 2018-05-16 MED ORDER — FLUCONAZOLE 150 MG PO TABS: 150.0000 mg | ORAL_TABLET | Freq: Once | ORAL | 0 refills | Status: AC

## 2018-05-16 MED ORDER — NYSTATIN 100000 UNIT/GM EX CREA: 1.0000 "application " | TOPICAL_CREAM | Freq: Four times a day (QID) | CUTANEOUS | 0 refills | Status: DC

## 2018-05-16 NOTE — Discharge Instructions (Addendum)
1. Medications: Keflex, nystatin, diflucan, usual home medications 2. Treatment: rest, drink plenty of fluids, take medications as prescribed 3. Follow Up: Please followup with your primary doctor in 3 days for discussion of your diagnoses and further evaluation after today's visit; if you do not have a primary care doctor use the resource guide provided to find one; return to the ER for fevers, persistent vomiting, worsening abdominal pain or other concerning symptoms.

## 2018-05-16 NOTE — ED Notes (Signed)
Urine specimen sent to lab

## 2018-05-16 NOTE — ED Provider Notes (Signed)
[facility] EMERGENCY DEPARTMENT Provider Note   CSN: [id number] Arrival date & time: 05/15/18  1643     History   Chief Complaint Chief Complaint  Patient presents with  . Vaginal Discharge    HPI [name] [name] is a 33 y.o. female with a hx of non-insulin-dependent diabetes (no history of DKA), herpes, hypertension, recurrent vaginal yeast infections presents to the Emergency Department complaining of gradual, persistent, progressively worsening vaginal burning and itching onset approximately 1 week ago but worsening over the last several days.  Patient reports using Monistat without relief.  She reports associated white vaginal discharge and urinary frequency. She states her blood sugars have been elevated since her flu diagnosis on 05/06/2018.  She reports they have been in the mid 200, but normally run in the mid-upper 100s.  Pt reports compliance with her medications.  Pt reports she has had severe yeast infections in the past and these symptoms feel similar.  She denies abdominal pain.  Patient reports she sexually active with one female partner and has no concern for STD.  Patient denies fever, chills, back pain, lower abdominal pain, nausea, vomiting, diarrhea, weakness, dizziness, syncope.  Patient reports she just completed her last menstrual cycle.  She reports using poise pads.  She does not use always.   The history is provided by the patient and medical records. No language interpreter was used.    Past Medical History:  Diagnosis Date  . Abnormal Pap smear   . Diabetes mellitus    type II  . GBS (group B streptococcus) UTI complicating pregnancy   . Herpes simplex without mention of complication   . Hypertension   . Irregular menses   . Obesity     Patient Active Problem List   Diagnosis Date Noted  . Chest pain 05/20/2017  . History of tubal ligation   . Medication refill 07/22/2016  . Vaginal discharge 05/16/2016  . Constipation 03/04/2016    . Yeast infection 02/11/2016  . Alopecia areata 09/29/2013  . Type II diabetes mellitus, uncontrolled (HCC) 09/20/2007  . 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 Obesity 07/09/2006  . RHINITIS, ALLERGIC 07/09/2006  . PAPANICOLAOU SMEAR, ABNORMAL 07/09/2006    Past Surgical History:  Procedure Laterality Date  . CESAREAN SECTION  01/30/2011   Procedure: CESAREAN SECTION;  Surgeon: Tilda Burrow, MD;  Location: [facility];  Service: Gynecology;  Laterality: N/A;  . CESAREAN SECTION N/A 10/22/2014   Procedure: CESAREAN SECTION;  Surgeon: Reva Bores, MD;  Location: [facility];  Service: Obstetrics;  Laterality: N/A;  . TUBAL LIGATION    . WISDOM TOOTH EXTRACTION       OB History    Gravida  4   Para  2   Term  1   Preterm  1   AB  2   Living  2     SAB  2   TAB      Ectopic      Multiple  0   Live Births  2            Home Medications    Prior to Admission medications   Medication Sig Start Date End Date Taking? Authorizing Provider  acetaminophen (TYLENOL) 500 MG tablet Take 1,000 mg by mouth every 6 (six) hours as needed for mild pain.    [provider]  cephALEXin (KEFLEX) 500 MG capsule Take 1 capsule (500 mg total) by mouth 4 (four) times daily. 05/16/18  Ramina Hulet, Dahlia Client, PA-C  fluconazole (DIFLUCAN) 150 MG tablet Take 1 tablet (150 mg total) by mouth once for 1 dose. In 72 hours if symptoms persist 05/16/18 05/16/18  Katina Remick, Dahlia Client, PA-C  glipiZIDE (GLUCOTROL) 10 MG tablet Take 1 tablet (10 mg total) by mouth daily before breakfast. 05/20/17   Freddrick March, MD  metFORMIN (GLUCOPHAGE) 500 MG tablet Take 1 tablet (500 mg total) by mouth 2 (two) times daily with a meal. 07/31/17   Freddrick March, MD  naproxen (NAPROSYN) 500 MG tablet Take 1 tablet (500 mg total) by mouth 2 (two) times daily with a meal. As needed for pain 05/06/18   Linwood Dibbles, MD  nystatin cream (MYCOSTATIN) Apply 1 application topically 4 (four) times daily. Apply to  affected area every 4-6 hours x 10 days 05/16/18   Eulon Allnutt, Dahlia Client, PA-C  ondansetron (ZOFRAN ODT) 8 MG disintegrating tablet Take 1 tablet (8 mg total) by mouth every 8 (eight) hours as needed for nausea or vomiting. 05/06/18   Linwood Dibbles, MD  oseltamivir (TAMIFLU) 75 MG capsule Take 1 capsule (75 mg total) by mouth 2 (two) times daily. 05/06/18   Linwood Dibbles, MD    Family History Family History  Problem Relation Age of Onset  . Diabetes Paternal Grandmother   . Hypertension Paternal Grandmother   . Diabetes Maternal Grandmother   . Diabetes Father   . Cancer Paternal Uncle     Social History Social History   Tobacco Use  . Smoking status: Former Games developer  . Smokeless tobacco: Never Used  Substance Use Topics  . Alcohol use: Yes    Comment: "occassionally"  . Drug use: No     Allergies   Patient has no known allergies.   Review of Systems Review of Systems  Constitutional: Negative for appetite change, diaphoresis, fatigue, fever and unexpected weight change.  HENT: Negative for mouth sores.   Eyes: Negative for visual disturbance.  Respiratory: Negative for cough, chest tightness, shortness of breath and wheezing.   Cardiovascular: Negative for chest pain.  Gastrointestinal: Negative for abdominal pain, constipation, diarrhea, nausea and vomiting.  Endocrine: Negative for polydipsia, polyphagia and polyuria.  Genitourinary: Positive for dysuria, frequency, vaginal discharge and vaginal pain. Negative for hematuria, urgency and vaginal bleeding.  Musculoskeletal: Negative for back pain and neck stiffness.  Skin: Negative for rash.  Allergic/Immunologic: Negative for immunocompromised state.  Neurological: Negative for syncope, light-headedness and headaches.  Hematological: Does not bruise/bleed easily.  Psychiatric/Behavioral: Negative for sleep disturbance. The patient is not nervous/anxious.      Physical Exam Updated Vital Signs BP (!) 133/92 (BP Location:  Left Arm)   Pulse (!) 101   Temp 99.9 F (37.7 C) (Oral)   Resp 18   LMP 05/09/2018   SpO2 100%   Physical Exam Vitals signs and nursing note reviewed. Exam conducted with a chaperone present.  Constitutional:      General: She is not in acute distress.    Appearance: She is well-developed. She is not diaphoretic.  HENT:     Head: Normocephalic and atraumatic.  Eyes:     Conjunctiva/sclera: Conjunctivae normal.  Neck:     Musculoskeletal: Normal range of motion.  Cardiovascular:     Rate and Rhythm: Normal rate and regular rhythm.     Heart sounds: Normal heart sounds. No murmur.  Pulmonary:     Effort: Pulmonary effort is normal. No respiratory distress.     Breath sounds: Normal breath sounds. No wheezing.  Abdominal:  General: Bowel sounds are normal.     Palpations: Abdomen is soft.     Tenderness: There is no abdominal tenderness. There is no guarding or rebound.     Hernia: There is no hernia in the right inguinal area or left inguinal area.  Genitourinary:    Exam position: Supine.     Pubic Area: No rash.      Labia:        Right: Rash and lesion present. No tenderness.        Left: Rash and lesion present. No tenderness.      Vagina: No signs of injury and foreign body. Vaginal discharge (Thick, white, clinging to the vaginal vault.), erythema and bleeding (scant at the cervical os) present. No tenderness.     Cervix: No cervical motion tenderness, discharge or friability.     Uterus: Not deviated, not enlarged, not fixed and not tender.      Adnexa:        Right: No mass, tenderness or fullness.         Left: No mass, tenderness or fullness.       Comments: Labia minora, majora and groin area covered in a beefy red rash.  Labia majora with areas of deep excoriations.  No ulcerations.  No bleeding.  No vesicles to suggest herpes.  Vaginal vault has discharge with erythema of the walls under the thick coating. Musculoskeletal: Normal range of motion.    Lymphadenopathy:     Lower Body: No right inguinal adenopathy. No left inguinal adenopathy.  Skin:    General: Skin is warm and dry.     Findings: No erythema.  Neurological:     Mental Status: She is alert.      ED Treatments / Results  Labs (all labs ordered are listed, but only abnormal results are displayed) Labs Reviewed  WET PREP, GENITAL - Abnormal; Notable for the following components:      Result Value   Clue Cells Wet Prep HPF POC PRESENT (*)    WBC, Wet Prep HPF POC MANY (*)    All other components within normal limits  URINALYSIS, ROUTINE W REFLEX MICROSCOPIC - Abnormal; Notable for the following components:   APPearance HAZY (*)    Glucose, UA 50 (*)    Hgb urine dipstick MODERATE (*)    Ketones, ur 20 (*)    Nitrite POSITIVE (*)    Leukocytes, UA MODERATE (*)    WBC, UA >50 (*)    Bacteria, UA MANY (*)    All other components within normal limits  CBG MONITORING, ED - Abnormal; Notable for the following components:   Glucose-Capillary 264 (*)    All other components within normal limits  POC URINE PREG, ED  GC/CHLAMYDIA PROBE AMP ([place]) NOT AT [facility]     Procedures Procedures (including critical care time)  Medications Ordered in ED Medications  nystatin cream (MYCOSTATIN) ( Topical Given 05/16/18 0118)  fluconazole (DIFLUCAN) tablet 200 mg (200 mg Oral Given 05/15/18 2357)     Initial Impression / Assessment and Plan / ED Course  I have reviewed the triage vital signs and the nursing notes.  Pertinent labs & imaging results that were available during my care of the patient were reviewed by me and considered in my medical decision making (see chart for details).     Patient presents with vaginal discharge, vaginal itching after influenza and Tamiflu.  Patient does report increased blood sugar at home with some increased urinary  frequency.  CBG 264.  Patient does not take insulin and no history of DKA.  Less likely to be DKA today with glucose  less than 300.  Ever, I do suspect that her increased glucose is contributing to her vaginal Candida.  Exam is consistent with vaginal and labial candidiasis.  No evidence of herpes.  Will give Diflucan here in the emergency department, nystatin cream and discharge home with same.    Urinalysis with evidence of urinary tract infection.  She is positive for nitrites and leukocytes with white blood cells and many bacteria.  Patient does have some glucose and a few ketones in her urine however looking back at previous urinalysis there is less glucose and she does often have ketones in her urine.  Patient is well-appearing.  Doubt DKA at this time.  Will be discharged home with antibiotics for her urinary tract infection.  She is to return here to the emergency department for new or worsening symptoms or persistent urinary frequency.  BP 106/74 (BP Location: Right Arm)   Pulse 68   Temp 99.9 F (37.7 C) (Oral)   Resp 16   LMP 05/09/2018   SpO2 99%    Final Clinical Impressions(s) / ED Diagnoses   Final diagnoses:  Yeast vaginitis  Acute cystitis without hematuria    ED Discharge Orders         Ordered    cephALEXin (KEFLEX) 500 MG capsule  4 times daily     05/16/18 0201    fluconazole (DIFLUCAN) 150 MG tablet   Once     05/16/18 0201    nystatin cream (MYCOSTATIN)  4 times daily     05/16/18 0201           Shadell Brenn, Boyd KerbsHannah, PA-C 05/16/18 0233    Nira Connardama, Pedro Eduardo, MD 05/16/18 [phone]

## 2018-05-17 LAB — GC/CHLAMYDIA PROBE AMP (~~LOC~~) NOT AT ARMC
Chlamydia: NEGATIVE
NEISSERIA GONORRHEA: NEGATIVE

## 2018-06-09 ENCOUNTER — Ambulatory Visit: Payer: Self-pay | Admitting: Family Medicine

## 2018-06-16 ENCOUNTER — Ambulatory Visit: Payer: Self-pay | Admitting: Family Medicine

## 2018-06-18 ENCOUNTER — Ambulatory Visit: Payer: Self-pay | Admitting: Family Medicine

## 2018-06-27 ENCOUNTER — Encounter (HOSPITAL_COMMUNITY): Payer: Self-pay

## 2018-06-27 ENCOUNTER — Emergency Department (HOSPITAL_COMMUNITY)
Admission: EM | Admit: 2018-06-27 | Discharge: 2018-06-27 | Disposition: A | Discharge status: Home / Self Care | Payer: Self-pay | Attending: Emergency Medicine | Admitting: Emergency Medicine

## 2018-06-27 DIAGNOSIS — B3731 Acute candidiasis of vulva and vagina: Secondary | ICD-10-CM

## 2018-06-27 DIAGNOSIS — Z87891 Personal history of nicotine dependence: Secondary | ICD-10-CM | POA: Insufficient documentation

## 2018-06-27 DIAGNOSIS — Z7984 Long term (current) use of oral hypoglycemic drugs: Secondary | ICD-10-CM | POA: Insufficient documentation

## 2018-06-27 DIAGNOSIS — I10 Essential (primary) hypertension: Secondary | ICD-10-CM | POA: Insufficient documentation

## 2018-06-27 DIAGNOSIS — B356 Tinea cruris: Secondary | ICD-10-CM | POA: Insufficient documentation

## 2018-06-27 DIAGNOSIS — B373 Candidiasis of vulva and vagina: Secondary | ICD-10-CM | POA: Insufficient documentation

## 2018-06-27 DIAGNOSIS — E119 Type 2 diabetes mellitus without complications: Secondary | ICD-10-CM | POA: Insufficient documentation

## 2018-06-27 LAB — WET PREP, GENITAL
Clue Cells Wet Prep HPF POC: NONE SEEN
Sperm: NONE SEEN
Trich, Wet Prep: NONE SEEN

## 2018-06-27 LAB — PREGNANCY, URINE: Preg Test, Ur: NEGATIVE

## 2018-06-27 MED ORDER — FLUCONAZOLE 150 MG PO TABS: 150.0000 mg | ORAL_TABLET | Freq: Once | ORAL | 1 refills | Status: AC

## 2018-06-27 MED ORDER — NYSTATIN 100000 UNIT/GM EX CREA: 1.0000 "application " | TOPICAL_CREAM | Freq: Four times a day (QID) | CUTANEOUS | 2 refills | Status: DC

## 2018-06-27 NOTE — ED Notes (Signed)
Pt aware of the need for urine sample  

## 2018-06-27 NOTE — ED Triage Notes (Signed)
Pt presents for evaluation of yeast infection. Reports has had similar recently. States vaginal itching externally, minimal discharge. No urinary symptoms. Pt reports had allergic rxn to OTC monistat before.

## 2018-06-27 NOTE — Discharge Instructions (Addendum)
Change underwear frequently, keep area dry. Apply cream as prescribed. Take Diflucan once, repeat dose in 1 week if symptoms continue.  Follow up with your doctor.

## 2018-06-27 NOTE — ED Provider Notes (Signed)
[facility] EMERGENCY DEPARTMENT Provider Note   CSN: [id number] Arrival date & time: 06/27/18  [id number]     History   Chief Complaint Chief Complaint  Patient presents with  . Vaginal Itching    HPI [name] [name] is a 33 y.o. female.  33 year old female presents with complaint of vaginal itching and discharge.  Patient reports history of frequent yeast infections and believes this is the same.  Patient is sexually active in a monogamous relationship without concerns for HIV or STDs.  Patient believes she may be allergic to Monistat, no other medication allergies.  Patient has a history of diabetes, reports blood sugar today is 103.  No other complaints or concerns.     Past Medical History:  Diagnosis Date  . Abnormal Pap smear   . Diabetes mellitus    type II  . GBS (group B streptococcus) UTI complicating pregnancy   . Herpes simplex without mention of complication   . Hypertension   . Irregular menses   . Obesity     Patient Active Problem List   Diagnosis Date Noted  . Chest pain 05/20/2017  . History of tubal ligation   . Medication refill 07/22/2016  . Vaginal discharge 05/16/2016  . Constipation 03/04/2016  . Yeast infection 02/11/2016  . Alopecia areata 09/29/2013  . Type II diabetes mellitus, uncontrolled (HCC) 09/20/2007  . 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 Obesity 07/09/2006  . RHINITIS, ALLERGIC 07/09/2006  . PAPANICOLAOU SMEAR, ABNORMAL 07/09/2006    Past Surgical History:  Procedure Laterality Date  . CESAREAN SECTION  01/30/2011   Procedure: CESAREAN SECTION;  Surgeon: Tilda BurrowJohn V Ferguson, MD;  Location: [facility];  Service: Gynecology;  Laterality: N/A;  . CESAREAN SECTION N/A 10/22/2014   Procedure: CESAREAN SECTION;  Surgeon: Reva Boresanya S Pratt, MD;  Location: [facility];  Service: Obstetrics;  Laterality: N/A;  . TUBAL LIGATION    . WISDOM TOOTH EXTRACTION       OB History    Gravida  4   Para  2   Term  1   Preterm  1   AB  2   Living  2     SAB  2   TAB      Ectopic      Multiple  0   Live Births  2            Home Medications    Prior to Admission medications   Medication Sig Start Date End Date Taking? Authorizing Provider  fluconazole (DIFLUCAN) 150 MG tablet Take 1 tablet (150 mg total) by mouth once for 1 dose. Repeat dose in 1 week if symptoms continue 06/27/18 06/27/18  Army MeliaMurphy, Laban Orourke A, PA-C  glipiZIDE (GLUCOTROL) 10 MG tablet Take 1 tablet (10 mg total) by mouth daily before breakfast. 05/20/17   Freddrick MarchAmin, Yashika, MD  metFORMIN (GLUCOPHAGE) 500 MG tablet Take 1 tablet (500 mg total) by mouth 2 (two) times daily with a meal. 07/31/17   Freddrick MarchAmin, Yashika, MD  nystatin cream (MYCOSTATIN) Apply 1 application topically 4 (four) times daily. Apply to affected area every 4-6 hours x 10 days 06/27/18   Jeannie FendMurphy, Glory Graefe A, PA-C    Family History Family History  Problem Relation Age of Onset  . Diabetes Paternal Grandmother   . Hypertension Paternal Grandmother   . Diabetes Maternal Grandmother   . Diabetes Father   . Cancer Paternal Uncle     Social History Social History   Tobacco Use  .  Smoking status: Former Games developer  . Smokeless tobacco: Never Used  Substance Use Topics  . Alcohol use: Yes    Comment: "occassionally"  . Drug use: No     Allergies   Patient has no known allergies.   Review of Systems Review of Systems  Constitutional: Negative for fever.  Genitourinary: Positive for vaginal discharge. Negative for dysuria, frequency and urgency.  Skin: Negative for rash and wound.  Allergic/Immunologic: Positive for immunocompromised state.  Hematological: Negative for adenopathy.  All other systems reviewed and are negative.    Physical Exam Updated Vital Signs BP 104/81 (BP Location: Right Arm)   Pulse 80   Temp 98.2 F (36.8 C) (Oral)   Resp 20   LMP 06/06/2018 (Approximate)   SpO2 99%   Physical Exam Vitals signs and nursing note reviewed. Exam  conducted with a chaperone present.  Constitutional:      General: She is not in acute distress.    Appearance: She is well-developed. She is not diaphoretic.  HENT:     Head: Normocephalic and atraumatic.  Pulmonary:     Effort: Pulmonary effort is normal.  Genitourinary:    Pubic Area: Rash present. No pubic lice.      Labia:        Right: Rash present. No tenderness.        Left: Rash present. No tenderness.      Vagina: No foreign body. Vaginal discharge and tenderness present.     Cervix: No discharge.     Comments: Swelling of the external genitalia and groin areas with moderate amount of thick white discharge present in the vaginal vault.  Neurological:     Mental Status: She is alert and oriented to person, place, and time.  Psychiatric:        Behavior: Behavior normal.      ED Treatments / Results  Labs (all labs ordered are listed, but only abnormal results are displayed) Labs Reviewed  WET PREP, GENITAL - Abnormal; Notable for the following components:      Result Value   Yeast Wet Prep HPF POC PRESENT (*)    WBC, Wet Prep HPF POC TOO NUMEROUS TO COUNT (*)    All other components within normal limits  PREGNANCY, URINE  GC/CHLAMYDIA PROBE AMP ([place]) NOT AT [facility]    EKG None  Radiology No results found.  Procedures Procedures (including critical care time)  Medications Ordered in ED Medications - No data to display   Initial Impression / Assessment and Plan / ED Course  I have reviewed the triage vital signs and the nursing notes.  Pertinent labs & imaging results that were available during my care of the patient were reviewed by me and considered in my medical decision making (see chart for details).  Clinical Course as of Jun 27 1116  Sun Jun 27, 2018  3520 33 year old female with history of diabetes presents with complaint of vaginal itching and thick white discharge.  Patient reports frequent yeast infections and feels this is the same.   Patient is sexually active in a monogamous relationship and denies concerns for STDs.  On exam patient has external yeast infection as well as thick white discharge suspicious for vaginal yeast infection.  Wet prep is positive for yeast.  Patient was given prescription for nystatin to apply externally topically as well as Diflucan with instruction to repeat Diflucan 1 week if symptoms persist.  Patient should also follow-up with her PCP as she may need additional  treatment.  Continue to monitor blood sugar.   [LM]    Clinical Course User Index [LM] Jeannie Fend, PA-C   Final Clinical Impressions(s) / ED Diagnoses   Final diagnoses:  Yeast vaginitis  Tinea cruris    ED Discharge Orders         Ordered    nystatin cream (MYCOSTATIN)  4 times daily     06/27/18 1115    fluconazole (DIFLUCAN) 150 MG tablet   Once     06/27/18 1115           Jeannie Fend, PA-C 06/27/18 1118    Cur[place]olo, Adam, DO 06/27/18 [phone]

## 2018-06-28 LAB — GC/CHLAMYDIA PROBE AMP (~~LOC~~) NOT AT ARMC
Chlamydia: NEGATIVE
Neisseria Gonorrhea: NEGATIVE

## 2018-11-05 ENCOUNTER — Ambulatory Visit (INDEPENDENT_AMBULATORY_CARE_PROVIDER_SITE_OTHER): Payer: Self-pay | Admitting: Family Medicine

## 2018-11-05 ENCOUNTER — Encounter: Payer: Self-pay | Admitting: Family Medicine

## 2018-11-05 ENCOUNTER — Other Ambulatory Visit: Payer: Self-pay

## 2018-11-05 VITALS — BP 118/72 | HR 90

## 2018-11-05 DIAGNOSIS — E1165 Type 2 diabetes mellitus with hyperglycemia: Secondary | ICD-10-CM

## 2018-11-05 LAB — POCT GLYCOSYLATED HEMOGLOBIN (HGB A1C): HbA1c, POC (controlled diabetic range): 13.3 % — AB (ref 0.0–7.0)

## 2018-11-05 LAB — POCT UA - MICROALBUMIN
Creatinine, POC: 50 mg/dL
Microalbumin Ur, POC: 30 mg/L

## 2018-11-05 MED ORDER — FLUCONAZOLE 150 MG PO TABS: 150.0000 mg | ORAL_TABLET | Freq: Every day | ORAL | 0 refills | Status: DC

## 2018-11-05 NOTE — Progress Notes (Signed)
   Subjective:   Patient ID: [name] [name]    DOB: [birth year], 33 y.o. female   MRN: [id number]  CC: possible yeast infection, T2DM  HPI: [name] [name] is a 33 y.o. female who presents to clinic today for the following.  Possible yeast infection Symptoms started yesterday with lots of itching on outside of her vagina and thick white discharge.  Has not noticed an odor. Sexually active, no new partners.  Feels similar to other yeast infections she has had in the past so she does not desire wet prep or STD testing at this time.  No abdominal or pelvic pain, no fever or chills.   T2DM Following up for poorly controlled DM. She does not check sugars at home.  Used to be on insulin but does not like needles and has stopped her diabetes meds.   Has not been taking her metformin in some time and restarted this 2 weeks ago.   Diet: salads, fast food 2x a week, tries to limit sodas and juices, reports frequent skipping of meals  Exercise: working on losing weight by walking, has not been out in some time because of the pandemic   Health maintenance -due for pap, patient declines and would like to come back   ROS: See HPI for pertinent ROS.  Social: former tobacco use  Medications reviewed. Objective:   BP 118/72   Pulse 90   SpO2 99%  Vitals and nursing note reviewed.  General: 33 yo female, NAD  Neck: supple CV: RRR no MRG  Lungs: normal effort  Abdomen: soft, NTND, +bs  Skin: warm, dry, no rash Extremities: warm and well perfused    Assessment & Plan:   Vaginal discharge  Similar to prior symptoms of yeast infection, declines wet prep and STD testing -Rx: diflucan, 2 tabs; instructed to repeat dose in 72 hours if symptoms persist -Return if symptoms worsen or do not improve   Type II diabetes mellitus, uncontrolled Very poorly controlled- A1c 13.3 today up from 10.12 July 2017.  She reports she has stopped taking her Metformin and glipizide for about a year and has resumed  taking Metformin about 2 weeks ago.   -Check urine microalbumin  -Counseled extensively on medication compliance and importance of controlling blood sugars.   -Advised checking blood sugars at home and keeping a log to bring to next visit.   -Additionally counseled on diet and exercise  -Will need close follow up and recheck A1c in 3 months    Orders Placed This Encounter  Procedures  . HgB A1c  . POCT UA - Microalbumin   Meds ordered this encounter  Medications  . fluconazole (DIFLUCAN) 150 MG tablet    Sig: Take 1 tablet (150 mg total) by mouth daily. Take the second tablet after 72 hours if symptoms persist.    Dispense:  2 tablet    Refill:  0   Lovenia Kim, MD [place] PGY-3 11/07/2018 8:36 PM

## 2018-11-06 ENCOUNTER — Other Ambulatory Visit: Payer: Self-pay | Admitting: Family Medicine

## 2018-11-07 NOTE — Assessment & Plan Note (Signed)
Very poorly controlled- A1c 13.3 today up from 10.12 July 2017.  She reports she has stopped taking her Metformin and glipizide for about a year and has resumed taking Metformin about 2 weeks ago.   -Check urine microalbumin  -Counseled extensively on medication compliance and importance of controlling blood sugars.   -Advised checking blood sugars at home and keeping a log to bring to next visit.   -Additionally counseled on diet and exercise  -Will need close follow up and recheck A1c in 3 months

## 2018-12-28 ENCOUNTER — Ambulatory Visit (INDEPENDENT_AMBULATORY_CARE_PROVIDER_SITE_OTHER): Payer: Self-pay | Admitting: Family Medicine

## 2018-12-28 ENCOUNTER — Other Ambulatory Visit: Payer: Self-pay

## 2018-12-28 ENCOUNTER — Other Ambulatory Visit (HOSPITAL_COMMUNITY)
Admission: RE | Admit: 2018-12-28 | Discharge: 2018-12-28 | Disposition: A | Discharge status: Home / Self Care | Payer: Self-pay | Source: Ambulatory Visit | Attending: Family Medicine | Admitting: Family Medicine

## 2018-12-28 VITALS — BP 118/78 | HR 80 | Wt 210.4 lb

## 2018-12-28 DIAGNOSIS — Z124 Encounter for screening for malignant neoplasm of cervix: Secondary | ICD-10-CM | POA: Insufficient documentation

## 2018-12-28 DIAGNOSIS — N898 Other specified noninflammatory disorders of vagina: Secondary | ICD-10-CM

## 2018-12-28 DIAGNOSIS — E119 Type 2 diabetes mellitus without complications: Secondary | ICD-10-CM

## 2018-12-28 LAB — POCT WET PREP (WET MOUNT)
Clue Cells Wet Prep Whiff POC: NEGATIVE
Trichomonas Wet Prep HPF POC: ABSENT

## 2018-12-28 MED ORDER — FREESTYLE LANCETS MISC: 12 refills | Status: DC

## 2018-12-28 MED ORDER — FREESTYLE TEST VI STRP: ORAL_STRIP | 12 refills | Status: DC

## 2018-12-28 MED ORDER — FLUCONAZOLE 150 MG PO TABS: 150.0000 mg | ORAL_TABLET | Freq: Every day | ORAL | 0 refills | Status: DC

## 2018-12-28 MED ORDER — FREESTYLE SYSTEM KIT: 1.0000 | PACK | 0 refills | Status: DC | PRN

## 2018-12-28 NOTE — Progress Notes (Signed)
Subjective:  [name] [name] is a 33 y.o. female who presents to the [facility] today with a chief complaint of vaginal discharge.   HPI: Type 2 diabetes mellitus without complication, without long-term current use of insulin ([place]) Patient's last A1c was in June 2020 and was 13.3.  She said it was so high because she had not been taking her metformin and had been eating poorly.  Has not been checking her blood sugars, and doubts her diet has improved .  Says she does not have a glucometer.  Med rec does show that she has been taking glipizide which she denies, says that she is never been taking that.  Does agree to follow with nutrition and accepts a reference card.  Vaginal discharge Complaining of thick white discharge, mild itching around vaginal area, no dysuria, no abdominal pain.  She says that she is safe and no one is being her do anything she does not want to do.  Screening for malignant neoplasm of cervix Overdue for Pap, no abnormal bleeding, consents to Pap while doing wet prep  Objective:  Physical Exam: BP 118/78   Pulse 80   Wt 210 lb 6.4 oz (95.4 kg)   LMP 12/07/2018 (Exact Date)   SpO2 100%   BMI 39.75 kg/m   Gen: NAD, resting comfortably obese CV: RRR with no murmurs appreciated Pulm: NWOB, CTAB with no crackles, wheezes, or rhonchi GI: Normal bowel sounds present. Soft, Nontender, Nondistended. GU: Sensitive exam performed with CMA in room entirely.  Thick white discharge on wet prep, no visual abnormality the cervix., mild skin breakdown in folds of groin area. MSK: no edema, cyanosis, or clubbing noted Skin: warm, dry Neuro: grossly normal, moves all extremities Psych: Normal affect and thought content  Results for orders placed or performed in visit on 12/28/18 (from the past 72 hour(s))  POCT Wet Prep [facility] [place])     Status: Abnormal   Collection Time: 12/28/18  8:45 AM  Result Value Ref Range   Source Wet Prep POC VAG    WBC, Wet Prep HPF POC 1-5    Bacteria Wet Prep HPF POC Few Few   Clue Cells Wet Prep HPF POC None None   Clue Cells Wet Prep Whiff POC Negative Whiff    Yeast Wet Prep HPF POC Moderate (A) None   Trichomonas Wet Prep HPF POC Absent Absent     Assessment/Plan:  Type 2 diabetes mellitus without complication, without long-term current use of insulin ([place]) Patient's last A1c was in June 2020 and was 13.3.  She said it was so high because she had not been taking her metformin and had been eating poorly.  Has not been checking her blood sugars, will order glucometer and have her check sugars daily until a 1 week follow-up with either pharmacy or her PCP. Med rec does show that she has been taking glipizide which she denies, says that she is never been taking that (will remove from medicine list)  We discussed that it is very likely that her blood sugars will still be extremely high and that without dramatic lifestyle changes she will likely require insulin to control her diabetes.  She was given a card to speak to nutrition.  Vaginal discharge Thick white discharge on exam, exam performed with CMA in the room entirely, mild skin breakdown in skin folds of groin.  Wet prep positive for yeast, Diflucan given, advised that diabetes control could help reduce recurrence of this  Screening for  malignant neoplasm of cervix Pap performed with CMA in room entirely entire time, no gross abnormalities of the cervix were identified visually.  Results pending   Marthenia RollingScott Stetson Pelaez, DO FAMILY MEDICINE RESIDENT - PGY3 12/30/2018 8:59 AM

## 2018-12-30 DIAGNOSIS — E119 Type 2 diabetes mellitus without complications: Secondary | ICD-10-CM | POA: Insufficient documentation

## 2018-12-30 DIAGNOSIS — E1140 Type 2 diabetes mellitus with diabetic neuropathy, unspecified: Secondary | ICD-10-CM | POA: Insufficient documentation

## 2018-12-30 DIAGNOSIS — Z124 Encounter for screening for malignant neoplasm of cervix: Secondary | ICD-10-CM | POA: Insufficient documentation

## 2018-12-30 LAB — CYTOLOGY - PAP
Diagnosis: NEGATIVE
HPV: DETECTED — AB

## 2018-12-30 NOTE — Assessment & Plan Note (Signed)
Thick white discharge on exam, exam performed with CMA in the room entirely, mild skin breakdown in skin folds of groin.  Wet prep positive for yeast, Diflucan given, advised that diabetes control could help reduce recurrence of this

## 2018-12-30 NOTE — Assessment & Plan Note (Signed)
Pap performed with CMA in room entirely entire time, no gross abnormalities of the cervix were identified visually.  Results pending

## 2018-12-30 NOTE — Assessment & Plan Note (Signed)
Patient's last A1c was in June 2020 and was 13.3.  She said it was so high because she had not been taking her metformin and had been eating poorly.  Has not been checking her blood sugars, will order glucometer and have her check sugars daily until a 1 week follow-up with either pharmacy or her PCP. Med rec does show that she has been taking glipizide which she denies, says that she is never been taking that (will remove from medicine list)  We discussed that it is very likely that her blood sugars will still be extremely high and that without dramatic lifestyle changes she will likely require insulin to control her diabetes.  She was given a card to speak to nutrition.

## 2018-12-30 NOTE — Assessment & Plan Note (Signed)
>>  ASSESSMENT AND PLAN FOR TYPE 2 DIABETES MELLITUS WITHOUT COMPLICATION, WITHOUT LONG-TERM CURRENT USE OF INSULIN  ([facility]) WRITTEN ON 12/30/2018  8:57 AM BY BLAND, SCOTT, DO  Patient's last A1c was in June 2020 and was 13.3.  She said it was so high because she had not been taking her metformin  and had been eating poorly.  Has not been checking her blood sugars, will order glucometer and have her check sugars daily until a 1 week follow-up with either pharmacy or her PCP. Med rec does show that she has been taking glipizide  which she denies, says that she is never been taking that (will remove from medicine list)  We discussed that it is very likely that her blood sugars will still be extremely high and that without dramatic lifestyle changes she will likely require insulin  to control her diabetes.  She was given a card to speak to nutrition.

## 2019-01-02 ENCOUNTER — Emergency Department (HOSPITAL_COMMUNITY)
Admission: EM | Admit: 2019-01-02 | Discharge: 2019-01-02 | Disposition: A | Discharge status: Home / Self Care | Payer: Self-pay | Attending: Emergency Medicine | Admitting: Emergency Medicine

## 2019-01-02 ENCOUNTER — Encounter (HOSPITAL_COMMUNITY): Payer: Self-pay | Admitting: Emergency Medicine

## 2019-01-02 ENCOUNTER — Other Ambulatory Visit: Payer: Self-pay

## 2019-01-02 DIAGNOSIS — Z87891 Personal history of nicotine dependence: Secondary | ICD-10-CM | POA: Insufficient documentation

## 2019-01-02 DIAGNOSIS — E119 Type 2 diabetes mellitus without complications: Secondary | ICD-10-CM | POA: Insufficient documentation

## 2019-01-02 DIAGNOSIS — L03818 Cellulitis of other sites: Secondary | ICD-10-CM | POA: Insufficient documentation

## 2019-01-02 DIAGNOSIS — Z7984 Long term (current) use of oral hypoglycemic drugs: Secondary | ICD-10-CM | POA: Insufficient documentation

## 2019-01-02 DIAGNOSIS — N760 Acute vaginitis: Secondary | ICD-10-CM

## 2019-01-02 DIAGNOSIS — N766 Ulceration of vulva: Secondary | ICD-10-CM | POA: Insufficient documentation

## 2019-01-02 DIAGNOSIS — I10 Essential (primary) hypertension: Secondary | ICD-10-CM | POA: Insufficient documentation

## 2019-01-02 MED ORDER — KETOCONAZOLE 2 % EX CREA: 1.0000 "application " | TOPICAL_CREAM | Freq: Every day | CUTANEOUS | 0 refills | Status: DC

## 2019-01-02 MED ORDER — VALACYCLOVIR HCL 500 MG PO TABS
1000.0000 mg | ORAL_TABLET | Freq: Once | ORAL | Status: AC
  Administered 2019-01-02: 1000 mg via ORAL
  Filled 2019-01-02: qty 2

## 2019-01-02 MED ORDER — DIBUCAINE (PERIANAL) 1 % EX OINT
TOPICAL_OINTMENT | CUTANEOUS | Status: DC | PRN
  Administered 2019-01-02: 22:00:00 via RECTAL
  Filled 2019-01-02: qty 28

## 2019-01-02 MED ORDER — CLINDAMYCIN HCL 150 MG PO CAPS: 300.0000 mg | ORAL_CAPSULE | Freq: Three times a day (TID) | ORAL | 0 refills | Status: DC

## 2019-01-02 MED ORDER — ACYCLOVIR 400 MG PO TABS: 400.0000 mg | ORAL_TABLET | Freq: Four times a day (QID) | ORAL | 0 refills | Status: DC

## 2019-01-02 MED ORDER — FLUCONAZOLE 150 MG PO TABS: ORAL_TABLET | ORAL | 0 refills | Status: DC

## 2019-01-02 MED ORDER — CLINDAMYCIN HCL 150 MG PO CAPS
300.0000 mg | ORAL_CAPSULE | Freq: Once | ORAL | Status: AC
  Administered 2019-01-02: 22:00:00 300 mg via ORAL
  Filled 2019-01-02: qty 2

## 2019-01-02 NOTE — ED Notes (Signed)
Patient verbalized understanding of discharge instructions. Opportunity for questions were provided. Pt. ambulatory and discharged home.  

## 2019-01-02 NOTE — ED Provider Notes (Signed)
[name] Provider Note   CSN: [id number] Arrival date & time: 01/02/19  1909     History   Chief Complaint Chief Complaint  Patient presents with  . Yeast infection    HPI [name] [name] is a 33 y.o. female who has hx of poorly controlled diabetes. Shes currently being treated for candidal vulvovaginitis.  She completed a course of Diflucan and is currently using nystatin cream to treat her vulvovaginal region.  She complains of burning, itching and developed a sore on her clitoral region with severe pain.  She states she has had difficulty sleeping.  Difficulty closing her legs.  She continues to have itching and burning on the labia.  The patient does admit to having a history of HSV but states that normally she has eruptions around the introitus.     HPI  Past Medical History:  Diagnosis Date  . Abnormal Pap smear   . Diabetes mellitus    type II  . GBS (group B streptococcus) UTI complicating pregnancy   . Herpes simplex without mention of complication   . Hypertension   . Irregular menses   . Obesity     Patient Active Problem List   Diagnosis Date Noted  . Screening for malignant neoplasm of cervix 12/30/2018  . Type 2 diabetes mellitus without complication, without long-term current use of insulin ([place]) 12/30/2018  . Chest pain 05/20/2017  . History of tubal ligation   . Medication refill 07/22/2016  . Vaginal discharge 05/16/2016  . Constipation 03/04/2016  . Yeast infection 02/11/2016  . Alopecia areata 09/29/2013  . Type II diabetes mellitus, uncontrolled ([place]) 09/20/2007  . 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 Obesity 07/09/2006  . RHINITIS, ALLERGIC 07/09/2006  . PAPANICOLAOU SMEAR, ABNORMAL 07/09/2006    Past Surgical History:  Procedure Laterality Date  . CESAREAN SECTION  01/30/2011   Procedure: CESAREAN SECTION;  Surgeon: [name], MD;  Location: [facility];  Service: Gynecology;   Laterality: N/A;  . CESAREAN SECTION N/A 10/22/2014   Procedure: CESAREAN SECTION;  Surgeon: [name], MD;  Location: [facility];  Service: Obstetrics;  Laterality: N/A;  . TUBAL LIGATION    . WISDOM TOOTH EXTRACTION       OB History    Gravida  4   Para  2   Term  1   Preterm  1   AB  2   Living  2     SAB  2   TAB      Ectopic      Multiple  0   Live Births  2            Home Medications    Prior to Admission medications   Medication Sig Start Date End Date Taking? Authorizing Provider  acyclovir (ZOVIRAX) 400 MG tablet Take 1 tablet (400 mg total) by mouth 4 (four) times daily. 01/02/19   Margarita Mail, PA-C  clindamycin (CLEOCIN) 150 MG capsule Take 2 capsules (300 mg total) by mouth 3 (three) times daily. May dispense as 190m capsules 01/02/19   Avrum Kimball, PA-C  fluconazole (DIFLUCAN) 150 MG tablet Take 1 tablet (150 mg total) by mouth daily. Take the second tablet after 72 hours if symptoms persist. 12/28/18   BSherene Sires DO  fluconazole (DIFLUCAN) 150 MG tablet Take one tablet every 72 hours for 3 doses 01/02/19   Brittanya Winburn, PA-C  glucose blood (FREESTYLE TEST STRIPS) test strip Check glucose 1  time per day and write result in logbook 12/28/18   Sherene Sires, DO  glucose monitoring kit (FREESTYLE) monitoring kit 1 each by Does not apply route as needed for other. 12/28/18   Sherene Sires, DO  ketoconazole (NIZORAL) 2 % cream Apply 1 application topically daily. 01/02/19   Margarita Mail, PA-C  Lancets (FREESTYLE) lancets Check glucose 1 time per day and write result in logbook 12/28/18   Sherene Sires, DO  metFORMIN (GLUCOPHAGE) 500 MG tablet TAKE 1 TABLET BY MOUTH TWICE DAILY WITH A MEAL 11/08/18   Lovenia Kim, MD    Family History Family History  Problem Relation Age of Onset  . Diabetes Paternal Grandmother   . Hypertension Paternal Grandmother   . Diabetes Maternal Grandmother   . Diabetes Father   . Cancer Paternal Uncle     Social  History Social History   Tobacco Use  . Smoking status: Former Research scientist (life sciences)  . Smokeless tobacco: Never Used  Substance Use Topics  . Alcohol use: Yes    Comment: "occassionally"  . Drug use: No     Allergies   Patient has no known allergies.   Review of Systems Review of Systems Ten systems reviewed and are negative for acute change, except as noted in the HPI.    Physical Exam Updated Vital Signs BP 123/85 (BP Location: Right Arm)   Pulse 84   Temp 98.4 F (36.9 C) (Oral)   Resp 18   LMP 12/12/2018 (Approximate)   SpO2 100%   Physical Exam Vitals signs and nursing note reviewed.  Constitutional:      General: She is not in acute distress.    Appearance: She is well-developed. She is not diaphoretic.  HENT:     Head: Normocephalic and atraumatic.  Eyes:     General: No scleral icterus.    Conjunctiva/sclera: Conjunctivae normal.  Neck:     Musculoskeletal: Normal range of motion.  Cardiovascular:     Rate and Rhythm: Normal rate and regular rhythm.     Heart sounds: Normal heart sounds. No murmur. No friction rub. No gallop.   Pulmonary:     Effort: Pulmonary effort is normal. No respiratory distress.     Breath sounds: Normal breath sounds.  Abdominal:     General: Bowel sounds are normal. There is no distension.     Palpations: Abdomen is soft. There is no mass.     Tenderness: There is no abdominal tenderness. There is no guarding.  Genitourinary:    Comments: Obvious candidal infection on the groin with macerated tissue, erythematous rash with well-demarcated border, some tissue breakdown in the inguinal region and in the labial folds.  The clitoral hood is markedly indurated, swollen and exquisitely tender to palpation without overt fluctuance.  There is a single ulceration at the apex of the clitoral hood.  No streaking. Skin:    General: Skin is warm and dry.  Neurological:     Mental Status: She is alert and oriented to person, place, and time.   Psychiatric:        Behavior: Behavior normal.      ED Treatments / Results  Labs (all labs ordered are listed, but only abnormal results are displayed) Labs Reviewed - No data to display  EKG None  Radiology No results found.  Procedures Procedures (including critical care time)  Medications Ordered in ED Medications  dibucaine (NUPERCAINAL) 1 % rectal ointment ( Rectal Given 01/02/19 2219)  clindamycin (CLEOCIN) capsule 300 mg (300 mg Oral  Given 01/02/19 2218)  valACYclovir (VALTREX) tablet 1,000 mg (1,000 mg Oral Given 01/02/19 2218)     Initial Impression / Assessment and Plan / ED Course  I have reviewed the triage vital signs and the nursing notes.  Pertinent labs & imaging results that were available during my care of the patient were reviewed by me and considered in my medical decision making (see chart for details).        Patient seen and shared visit with Dr. Rex Kras.  She has a history of poorly controlled diabetes, obvious fungal infection.  Patient may very likely have a superimposed bacterial infection but does not have any evidence of Fournier's gangrene.  She does appear to be in a lot of pain and I have provided dibucaine ointment to apply to the area of tenderness.  Secondarily this may reflect interruption of her herpes simplex virus in a region that it is not erupted previously causing the pain and swelling.  She does have a single ulceration.  Patient will be treated with clindamycin, extended course of fluconazole, acyclovir and I have changed her nystatin ointment to ketoconazole cream which is fungicidal.  Patient also given home supportive care instructions including ice pack over a towel to the region of swelling and pain, Tylenol and Motrin by mouth.  Also advised the patient to keep the area as dry as possible, and follow closely with her PCP or OB/GYN.  Patient appears appropriate for discharge at this time.  Discussed return precaution.  Final  Clinical Impressions(s) / ED Diagnoses   Final diagnoses:  Cellulitis of other specified site  Vulvovaginitis  Genital labial ulcer    ED Discharge Orders         Ordered    clindamycin (CLEOCIN) 150 MG capsule  3 times daily     01/02/19 2213    fluconazole (DIFLUCAN) 150 MG tablet     01/02/19 2213    ketoconazole (NIZORAL) 2 % cream  Daily     01/02/19 2213    acyclovir (ZOVIRAX) 400 MG tablet  4 times daily     01/02/19 2214           Margarita Mail, PA-C 01/02/19 2337    Little, Wenda Overland, MD 01/04/19 [phone]

## 2019-01-02 NOTE — Discharge Instructions (Signed)
Apply to dibucaine ointment as needed. Apply an ice pack over a towel to reduce swelling Take tylenol or motrin for pain. You may take Unisom or benadryl to help you sleep.( both available over the counter) Call your primary care doctor tomorrow to establish close follow up.

## 2019-01-02 NOTE — ED Triage Notes (Signed)
Patient reports persistent vaginal itching and soreness for several days diagnosed by her PCP with yeast infection prescribed with Diflucan and vaginal cream with no improvement . Denies dysuria or fever.

## 2019-01-04 ENCOUNTER — Ambulatory Visit: Payer: Self-pay | Admitting: Pharmacist

## 2019-01-06 ENCOUNTER — Other Ambulatory Visit: Payer: Self-pay

## 2019-01-06 ENCOUNTER — Ambulatory Visit (INDEPENDENT_AMBULATORY_CARE_PROVIDER_SITE_OTHER): Payer: Self-pay | Admitting: Pharmacist

## 2019-01-06 DIAGNOSIS — E119 Type 2 diabetes mellitus without complications: Secondary | ICD-10-CM

## 2019-01-06 MED ORDER — OZEMPIC (0.25 OR 0.5 MG/DOSE) 2 MG/1.5ML ~~LOC~~ SOPN: 0.2500 mg | PEN_INJECTOR | SUBCUTANEOUS | 0 refills | Status: DC

## 2019-01-06 MED ORDER — INSULIN GLARGINE 100 UNIT/ML SOLOSTAR PEN: 10.0000 [IU] | PEN_INJECTOR | SUBCUTANEOUS | 0 refills | Status: DC

## 2019-01-06 MED ORDER — METFORMIN HCL ER 500 MG PO TB24: 500.0000 mg | ORAL_TABLET | Freq: Every day | ORAL | 3 refills | Status: DC

## 2019-01-06 NOTE — Assessment & Plan Note (Signed)
Diabetes longstanding since ~2007 currently uncontrolled. Patient is able to verbalize appropriate hypoglycemia management plan. Patient is adherent with medication. Control is suboptimal due to financial barriers. -Initiate basal insulin glargine (insulin Lantus). Patient will continue to titrate 1 unit every day if fasting CBGs > 100mg /dl until fasting CBGs reach goal or next visit. Samples provided.  -Initiate GLP-1 Ozempic (semaglutide) 0.25 mg weekly (will call in 2 weeks to titrate up to 0.5 mg weekly). Samples provided.  -Change metformin IR formulation to extended release. Reduce current dose of metformin to 500 mg XR daily and then assess GI tolerability.   -Extensively discussed pathophysiology of DM, recommended lifestyle interventions, dietary effects on glycemic control -Counseled on s/sx of and management of hypoglycemia

## 2019-01-06 NOTE — Progress Notes (Signed)
    S:     Patient arrives in good spirits ambulating without assistance.  Presents for diabetes evaluation, education, and management. Pt reports she has used insulin in the past during her pregnancy (~4 years ago) Patient was referred and last seen by Primary Care Provider, Dr. Criss Rosales on August 18th.   Patient reports Diabetes was diagnosed in ~2007 (pt was 33 yrs old).   Insurance coverage/medication affordability: no Science writer  -Pt is a part-time Teacher, early years/pre. Currently, she is not offered any hours. She receives unemployment (~$85/week). Pt is married; husband receives ~$760/month for disability.  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 At that time she should have state-employee insurance.   Patient adherent with medications.  Current diabetes medications include: metformin 500 mg twice daily  (IR) with complaint of diarrhea.  Patient denies hypoglycemic events.  Patient reported dietary habits: tries to eat baked foods; uses an air fryer.  Patient-reported exercise habits: walks every evening with children    Patient reports frequent nocturia.     O:  Physical Exam Vitals signs reviewed.  Constitutional:      Appearance: She is obese.  Neurological:     Mental Status: She is alert.  Psychiatric:        Mood and Affect: Mood normal.        Behavior: Behavior normal.        Thought Content: Thought content normal.    Review of Systems  All other systems reviewed and are negative.   Lab Results  Component Value Date   HGBA1C 13.3 (A) 11/05/2018   Vitals:   01/06/19 0935  BP: 116/76  Pulse: (!) 58  SpO2: (!) 86%    Lipid Panel     Component Value Date/Time   CHOL 142 05/20/2017 0915   TRIG 71 05/20/2017 0915   HDL 42 05/20/2017 0915   CHOLHDL 3.4 05/20/2017 0915   CHOLHDL 3.4 08/02/2013 1518   VLDL 20 08/02/2013 1518   LDLCALC 86 05/20/2017 0915   LDLDIRECT 98 07/01/2012 0954    Home fasting CBG: 200-300s   2 hour post-prandial/random CBG: not checking   A/P: Diabetes longstanding since ~2007 currently uncontrolled. Patient is able to verbalize appropriate hypoglycemia management plan. Patient is adherent with medication. Control is suboptimal due to financial barriers. -Initiate basal insulin glargine (insulin Lantus). Patient will continue to titrate 1 unit every day if fasting CBGs > 100mg /dl until fasting CBGs reach goal or next visit. Samples provided.  -Initiate GLP-1 Ozempic (semaglutide) 0.25 mg weekly (will call in 2 weeks to titrate up to 0.5 mg weekly). Samples provided.  -Change metformin IR formulation to extended release. Reduce current dose of metformin to 500 mg XR daily and then assess GI tolerability.   -Extensively discussed pathophysiology of DM, recommended lifestyle interventions, dietary effects on glycemic control -Counseled on s/sx of and management of hypoglycemia -Next A1C anticipated September 2020.   Pt is not indicated for statin or ASA81.  Written patient instructions provided.  Total time in face to face counseling 35 minutes.   Follow up PCP Clinic Visit in 1 month. Pharmacy will follow up via telephone in 2 weeks.   Patient seen with Murlean Iba, PharmD Candidate, Lorel Monaco, PharmD PGY-1 Resident and Drexel Iha, PharmD,  PGY2 Pharmacy Resident.

## 2019-01-06 NOTE — Progress Notes (Signed)
Reviewed: Agree with Dr. Koval's documentation and management. 

## 2019-01-06 NOTE — Assessment & Plan Note (Signed)
>>  ASSESSMENT AND PLAN FOR TYPE 2 DIABETES MELLITUS WITHOUT COMPLICATION, WITHOUT LONG-TERM CURRENT USE OF INSULIN  ([facility]) WRITTEN ON 01/06/2019  1:10 PM BY KOVAL, PETER G, RPH-CPP  Diabetes longstanding since ~2007 currently uncontrolled. Patient is able to verbalize appropriate hypoglycemia management plan. Patient is adherent with medication. Control is suboptimal due to financial barriers. -Initiate basal insulin  glargine (insulin  Lantus ). Patient will continue to titrate 1 unit every day if fasting CBGs > 100mg /dl until fasting CBGs reach goal or next visit. Samples provided.  -Initiate GLP-1 Ozempic  (semaglutide ) 0.25 mg weekly (will call in 2 weeks to titrate up to 0.5 mg weekly). Samples provided.  -Change metformin  IR formulation to extended release. Reduce current dose of metformin  to 500 mg XR daily and then assess GI tolerability.   -Extensively discussed pathophysiology of DM, recommended lifestyle interventions, dietary effects on glycemic control -Counseled on s/sx of and management of hypoglycemia

## 2019-01-06 NOTE — Patient Instructions (Addendum)
It was great seeing you in clinic today [name]!  Today the plan is....   1) Start insulin glargine (Lantus) 10 units daily. Please increase dose by 1 unit every day if blood sugars are greater than 100.  2) Start semaglutide (Ozempic) 0.25 mg weekly.  3) Start taking extended release metformin 500 mg once daily  4) Keep up the great work with walking every night and using your air fryer/eating baked foods!  The pharmacy team will follow up with you via phone call in about 2 weeks to see how you are doing.

## 2019-01-21 ENCOUNTER — Telehealth: Payer: Self-pay | Admitting: Pharmacist

## 2019-01-21 NOTE — Telephone Encounter (Signed)
Called pt to f/u about initiation of Lantus, Ozempic, and metformin XL formulation. Pt is currently using Lantus 10 units daily, Ozempic 0.25 mg weekly, and metformin XL 500 mg daily. Pt states her BG have ranged between 100-120; specifically, BG readings ~100 in the morning. She has not experienced any episodes or s/sx of hypoglycemia.Will plan to continue DM meds at current doses and f/u with patient on 02/03/19 (preferably around 8:30-9:00 AM) to ensure she is still tolerating her DM medication regimen. Pt verbalized understanding.

## 2019-02-03 ENCOUNTER — Telehealth: Payer: Self-pay | Admitting: Pharmacist

## 2019-02-03 NOTE — Telephone Encounter (Signed)
Called pt on 02/03/19 at 10:45 AM and 1:13 PM and left HIPAA compliant VM with instructions to call back Family Medicine office.

## 2019-02-04 NOTE — Telephone Encounter (Signed)
Called pt on 02/04/19 at 11:05 AM and left HIPAA compliant VM with instructions to call back Family Medicine office.

## 2019-02-07 NOTE — Telephone Encounter (Signed)
Called pt on 02/07/19 at 4:15 PM and left HIPAA compliant VM with instructions to call back Family Medicine office

## 2019-02-11 ENCOUNTER — Emergency Department (HOSPITAL_COMMUNITY)
Admission: EM | Admit: 2019-02-11 | Discharge: 2019-02-11 | Disposition: A | Discharge status: Home / Self Care | Payer: Self-pay | Attending: Emergency Medicine | Admitting: Emergency Medicine

## 2019-02-11 ENCOUNTER — Other Ambulatory Visit: Payer: Self-pay

## 2019-02-11 DIAGNOSIS — I10 Essential (primary) hypertension: Secondary | ICD-10-CM | POA: Insufficient documentation

## 2019-02-11 DIAGNOSIS — Z79899 Other long term (current) drug therapy: Secondary | ICD-10-CM | POA: Insufficient documentation

## 2019-02-11 DIAGNOSIS — N764 Abscess of vulva: Secondary | ICD-10-CM | POA: Insufficient documentation

## 2019-02-11 DIAGNOSIS — Z87891 Personal history of nicotine dependence: Secondary | ICD-10-CM | POA: Insufficient documentation

## 2019-02-11 DIAGNOSIS — Z23 Encounter for immunization: Secondary | ICD-10-CM | POA: Insufficient documentation

## 2019-02-11 DIAGNOSIS — Z794 Long term (current) use of insulin: Secondary | ICD-10-CM | POA: Insufficient documentation

## 2019-02-11 DIAGNOSIS — L0291 Cutaneous abscess, unspecified: Secondary | ICD-10-CM

## 2019-02-11 DIAGNOSIS — E119 Type 2 diabetes mellitus without complications: Secondary | ICD-10-CM | POA: Insufficient documentation

## 2019-02-11 MED ORDER — TETANUS-DIPHTH-ACELL PERTUSSIS 5-2.5-18.5 LF-MCG/0.5 IM SUSP
0.5000 mL | Freq: Once | INTRAMUSCULAR | Status: AC
  Administered 2019-02-11: 0.5 mL via INTRAMUSCULAR
  Filled 2019-02-11: qty 0.5

## 2019-02-11 MED ORDER — CLINDAMYCIN HCL 300 MG PO CAPS: 300.0000 mg | ORAL_CAPSULE | Freq: Three times a day (TID) | ORAL | 0 refills | Status: AC

## 2019-02-11 MED ORDER — LIDOCAINE HCL (PF) 1 % IJ SOLN
5.0000 mL | Freq: Once | INTRAMUSCULAR | Status: AC
  Administered 2019-02-11: 5 mL
  Filled 2019-02-11: qty 5

## 2019-02-11 NOTE — ED Provider Notes (Signed)
[facility] EMERGENCY DEPARTMENT Provider Note   CSN: [id number] Arrival date & time: 02/11/19  1209    History   Chief Complaint Chief Complaint  Patient presents with  . Abscess   HPI [name] [name] is a 33 y.o. female with no significant past medical history who presents for evaluation of abscess. Patient with small abscess to left labia x3 days.  Area tender to palpation.  Has not tried anything for pain.  She rates her current pain a 6/10.  Pain worse with palpation.  Denies fever, chills, nausea, vomiting, chest pain, shortness of breath, abdominal pain, dysuria, pelvic pain, vaginal discharge, concerns for STDs.  Denies additional aggravating or alleviating factors. Sx began after shaving her vagina.  History obtained from patient and past medical records.  No interpreter used.     HPI  Past Medical History:  Diagnosis Date  . Abnormal Pap smear   . Diabetes mellitus    type II  . GBS (group B streptococcus) UTI complicating pregnancy   . Herpes simplex without mention of complication   . Hypertension   . Irregular menses   . Obesity     Patient Active Problem List   Diagnosis Date Noted  . Screening for malignant neoplasm of cervix 12/30/2018  . Type 2 diabetes mellitus without complication, without long-term current use of insulin ([place]) 12/30/2018  . Chest pain 05/20/2017  . History of tubal ligation   . Medication refill 07/22/2016  . Vaginal discharge 05/16/2016  . Constipation 03/04/2016  . Yeast infection 02/11/2016  . Alopecia areata 09/29/2013  . Type II diabetes mellitus, uncontrolled ([place]) 09/20/2007  . 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 Obesity 07/09/2006  . RHINITIS, ALLERGIC 07/09/2006  . PAPANICOLAOU SMEAR, ABNORMAL 07/09/2006    Past Surgical History:  Procedure Laterality Date  . CESAREAN SECTION  01/30/2011   Procedure: CESAREAN SECTION;  Surgeon: [name], MD;  Location: [facility];  Service:  Gynecology;  Laterality: N/A;  . CESAREAN SECTION N/A 10/22/2014   Procedure: CESAREAN SECTION;  Surgeon: [name], MD;  Location: [facility];  Service: Obstetrics;  Laterality: N/A;  . TUBAL LIGATION    . WISDOM TOOTH EXTRACTION       OB History    Gravida  4   Para  2   Term  1   Preterm  1   AB  2   Living  2     SAB  2   TAB      Ectopic      Multiple  0   Live Births  2            Home Medications    Prior to Admission medications   Medication Sig Start Date End Date Taking? Authorizing Provider  acyclovir (ZOVIRAX) 400 MG tablet Take 1 tablet (400 mg total) by mouth 4 (four) times daily. 01/02/19   Margarita Mail, PA-C  clindamycin (CLEOCIN) 300 MG capsule Take 1 capsule (300 mg total) by mouth 3 (three) times daily for 5 days. 02/11/19 02/16/19  ,  A, PA-C  fluconazole (DIFLUCAN) 150 MG tablet Take 1 tablet (150 mg total) by mouth daily. Take the second tablet after 72 hours if symptoms persist. 12/28/18   Sherene Sires, DO  fluconazole (DIFLUCAN) 150 MG tablet Take one tablet every 72 hours for 3 doses 01/02/19   Harris, Abigail, PA-C  glucose blood (FREESTYLE TEST STRIPS) test strip Check glucose 1 time per day and write  result in logbook 12/28/18   Sherene Sires, DO  glucose monitoring kit (FREESTYLE) monitoring kit 1 each by Does not apply route as needed for other. 12/28/18   Sherene Sires, DO  Insulin Glargine (LANTUS) 100 UNIT/ML Solostar Pen Inject 10-30 Units into the skin every morning. 01/06/19   Hensel, Jamal Collin, MD  ketoconazole (NIZORAL) 2 % cream Apply 1 application topically daily. 01/02/19   Margarita Mail, PA-C  Lancets (FREESTYLE) lancets Check glucose 1 time per day and write result in logbook 12/28/18   Sherene Sires, DO  metFORMIN (GLUCOPHAGE-XR) 500 MG 24 hr tablet Take 1 tablet (500 mg total) by mouth daily. 01/06/19   Zenia Resides, MD  Semaglutide,0.25 or 0.5MG/DOS, (OZEMPIC, 0.25 OR 0.5 MG/DOSE,) 2 MG/1.5ML SOPN Inject 0.25 mg  into the skin once a week. 01/06/19   Zenia Resides, MD    Family History Family History  Problem Relation Age of Onset  . Diabetes Paternal Grandmother   . Hypertension Paternal Grandmother   . Diabetes Maternal Grandmother   . Diabetes Father   . Cancer Paternal Uncle     Social History Social History   Tobacco Use  . Smoking status: Former Research scientist (life sciences)  . Smokeless tobacco: Never Used  Substance Use Topics  . Alcohol use: Yes    Comment: "occassionally"  . Drug use: No     Allergies   Patient has no known allergies.   Review of Systems Review of Systems  Constitutional: Negative.   HENT: Negative.   Respiratory: Negative.   Cardiovascular: Negative.   Gastrointestinal: Negative.   Genitourinary: Negative.   Musculoskeletal: Negative.   Skin: Positive for rash.  Neurological: Negative.   All other systems reviewed and are negative.    Physical Exam Updated Vital Signs BP 128/81   Pulse 73   Temp 98.5 F (36.9 C) (Oral)   Resp 18   LMP 02/01/2019 (Exact Date)   SpO2 100%   Physical Exam Vitals signs and nursing note reviewed. Exam conducted with a chaperone present.  Constitutional:      General: She is not in acute distress.    Appearance: She is well-developed. She is not ill-appearing, toxic-appearing or diaphoretic.  HENT:     Head: Normocephalic and atraumatic.     Nose: Nose normal.     Mouth/Throat:     Mouth: Mucous membranes are moist.     Pharynx: Oropharynx is clear.  Eyes:     Pupils: Pupils are equal, round, and reactive to light.  Neck:     Musculoskeletal: Normal range of motion.  Cardiovascular:     Rate and Rhythm: Normal rate.     Pulses: Normal pulses.     Heart sounds: Normal heart sounds.  Pulmonary:     Effort: Pulmonary effort is normal. No respiratory distress.     Breath sounds: Normal breath sounds.  Abdominal:     General: Bowel sounds are normal. There is no distension.     Palpations: Abdomen is soft.      Tenderness: There is no abdominal tenderness. There is no right CVA tenderness, left CVA tenderness, guarding or rebound.     Hernia: No hernia is present. There is no hernia in the left inguinal area or right inguinal area.  Genitourinary:    Pubic Area: No rash or pubic lice.      Labia:        Right: No rash, tenderness, lesion or injury.        Left: Tenderness  present. No rash, lesion or injury.      Urethra: No prolapse, urethral pain, urethral swelling or urethral lesion.       Comments: 1 cm area of fluctuance to left superior labia, fat pad. Mild surrounding induration with surrounding erythema. No barothin abscess or cyst. No lymphadenopathy. Musculoskeletal: Normal range of motion.     Right hip: Normal.     Left hip: Normal.  Lymphadenopathy:     Lower Body: No right inguinal adenopathy. No left inguinal adenopathy.  Skin:    General: Skin is warm and dry.     Capillary Refill: Capillary refill takes less than 2 seconds.     Comments: 1 cm area of fluctuance with 2 cm - 3 cm area of induration surrounding to left labial fat pad.  Neurological:     Mental Status: She is alert.    ED Treatments / Results  Labs (all labs ordered are listed, but only abnormal results are displayed) Labs Reviewed - No data to display  EKG None  Radiology No results found.  Procedures .Marland KitchenIncision and Drainage  Date/Time: 02/11/2019 3:09 PM Performed by: Nettie Elm, PA-C Authorized by: Nettie Elm, PA-C   Consent:    Consent obtained:  Verbal   Consent given by:  Patient   Risks discussed:  Bleeding, incomplete drainage, pain and damage to other organs   Alternatives discussed:  No treatment Universal protocol:    Procedure explained and questions answered to patient or proxy's satisfaction: yes     Relevant documents present and verified: yes     Test results available and properly labeled: yes     Imaging studies available: yes     Required blood products,  implants, devices, and special equipment available: yes     Site/side marked: yes     Immediately prior to procedure a time out was called: yes     Patient identity confirmed:  Verbally with patient Location:    Type:  Abscess   Location:  Anogenital   Anogenital location:  V[place]va Pre-procedure details:    Skin preparation:  Betadine Anesthesia (see MAR for exact dosages):    Anesthesia method:  Local infiltration   Local anesthetic:  Lidocaine 1% WITH epi Procedure type:    Complexity:  Complex Procedure details:    Incision types:  Single straight   Incision depth:  Subcutaneous   Scalpel blade:  11   Wound management:  Probed and deloculated, irrigated with saline and extensive cleaning   Drainage:  Purulent   Drainage amount:  Moderate   Packing materials:  1/4 in gauze Post-procedure details:    Patient tolerance of procedure:  Tolerated well, no immediate complications   (including critical care time)  Medications Ordered in ED Medications  lidocaine (PF) (XYLOCAINE) 1 % injection 5 mL (5 mLs Infiltration Given by Other 02/11/19 1506)  Tdap (BOOSTRIX) injection 0.5 mL (0.5 mLs Intramuscular Given 02/11/19 1428)   Initial Impression / Assessment and Plan / ED Course  I have reviewed the triage vital signs and the nursing notes.  Pertinent labs & imaging results that were available during my care of the patient were reviewed by me and considered in my medical decision making (see chart for details).  33 year old female appears otherwise well presents for evaluation of left labial/fat pad abscess.  No inguinal lymphadenopathy.  Small abscess to left labia which is not a Bartholin's abscess or cyst.  Will I&D in the emergency department.  Tetanus updated.  Incision  and drainage performed with copious purulent drainage.  Medicated gauze placed.  Mild surrounding erythema will prescribe antibiotics.  Patient understands she needs wound recheck in 2 days. Mild signs of cellulitis  is surrounding skin.    The patient has been appropriately medically screened and/or stabilized in the ED. I have low suspicion for any other emergent medical condition which would require further screening, evaluation or treatment in the ED or require inpatient management.  Patient is hemodynamically stable and in no acute distress.  Patient able to ambulate in department prior to ED.  Evaluation does not show acute pathology that would require ongoing or additional emergent interventions while in the emergency department or further inpatient treatment.  I have discussed the diagnosis with the patient and answered all questions.  Pain is been managed while in the emergency department and patient has no further complaints prior to discharge.  Patient is comfortable with plan discussed in room and is stable for discharge at this time.  I have discussed strict return precautions for returning to the emergency department.  Patient was encouraged to follow-up with PCP/specialist refer to at discharge.      Final Clinical Impressions(s) / ED Diagnoses   Final diagnoses:  Abscess    ED Discharge Orders         Ordered    clindamycin (CLEOCIN) 300 MG capsule  3 times daily     02/11/19 1510           ,  A, PA-C 02/11/19 [place], Dan, DO 02/14/19 1505

## 2019-02-11 NOTE — Discharge Instructions (Signed)
Take the antibiotics as prescribed.  You will need to follow-up for wound recheck in 2 days.  If you develop fever, chills, worsening swelling, redness, warmth to the site please seek reevaluation.

## 2019-02-11 NOTE — ED Notes (Signed)
Discharge instructions and prescription discussed with Pt. Pt verbalized understanding. Pt stable and ambulatory.    

## 2019-02-11 NOTE — ED Triage Notes (Signed)
Patient reports abscess to L side of vagina x 4 days. Denies fevers.

## 2019-02-14 ENCOUNTER — Emergency Department (HOSPITAL_COMMUNITY)
Admission: EM | Admit: 2019-02-14 | Discharge: 2019-02-14 | Disposition: A | Discharge status: Home / Self Care | Payer: Self-pay | Attending: Emergency Medicine | Admitting: Emergency Medicine

## 2019-02-14 DIAGNOSIS — E119 Type 2 diabetes mellitus without complications: Secondary | ICD-10-CM | POA: Insufficient documentation

## 2019-02-14 DIAGNOSIS — Z794 Long term (current) use of insulin: Secondary | ICD-10-CM | POA: Insufficient documentation

## 2019-02-14 DIAGNOSIS — I10 Essential (primary) hypertension: Secondary | ICD-10-CM | POA: Insufficient documentation

## 2019-02-14 DIAGNOSIS — Z87891 Personal history of nicotine dependence: Secondary | ICD-10-CM | POA: Insufficient documentation

## 2019-02-14 DIAGNOSIS — Z5189 Encounter for other specified aftercare: Secondary | ICD-10-CM

## 2019-02-14 DIAGNOSIS — N764 Abscess of vulva: Secondary | ICD-10-CM | POA: Insufficient documentation

## 2019-02-14 DIAGNOSIS — Z79899 Other long term (current) drug therapy: Secondary | ICD-10-CM | POA: Insufficient documentation

## 2019-02-14 NOTE — ED Provider Notes (Signed)
[facility] EMERGENCY DEPARTMENT Provider Note   CSN: [id number] Arrival date & time: 02/14/19  0940     History   Chief Complaint No chief complaint on file.   HPI [name] [name] is a 33 y.o. female.     HPI   33 year old female presents today with complaints of abscess.  She had an I&D performed on 02/11/2019.  She notes this was on her labia.  She notes continued drainage minor pain she notes symptoms are improving.  She denies fever.    Past Medical History:  Diagnosis Date  . Abnormal Pap smear   . Diabetes mellitus    type II  . GBS (group B streptococcus) UTI complicating pregnancy   . Herpes simplex without mention of complication   . Hypertension   . Irregular menses   . Obesity     Patient Active Problem List   Diagnosis Date Noted  . Screening for malignant neoplasm of cervix 12/30/2018  . Type 2 diabetes mellitus without complication, without long-term current use of insulin ([place]) 12/30/2018  . Chest pain 05/20/2017  . History of tubal ligation   . Medication refill 07/22/2016  . Vaginal discharge 05/16/2016  . Constipation 03/04/2016  . Yeast infection 02/11/2016  . Alopecia areata 09/29/2013  . Type II diabetes mellitus, uncontrolled ([place]) 09/20/2007  . 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 Obesity 07/09/2006  . RHINITIS, ALLERGIC 07/09/2006  . PAPANICOLAOU SMEAR, ABNORMAL 07/09/2006    Past Surgical History:  Procedure Laterality Date  . CESAREAN SECTION  01/30/2011   Procedure: CESAREAN SECTION;  Surgeon: [name], MD;  Location: [facility];  Service: Gynecology;  Laterality: N/A;  . CESAREAN SECTION N/A 10/22/2014   Procedure: CESAREAN SECTION;  Surgeon: [name], MD;  Location: [facility];  Service: Obstetrics;  Laterality: N/A;  . TUBAL LIGATION    . WISDOM TOOTH EXTRACTION       OB History    Gravida  4   Para  2   Term  1   Preterm  1   AB  2   Living  2     SAB  2   TAB      Ectopic      Multiple  0   Live Births  2            Home Medications    Prior to Admission medications   Medication Sig Start Date End Date Taking? Authorizing Provider  acyclovir (ZOVIRAX) 400 MG tablet Take 1 tablet (400 mg total) by mouth 4 (four) times daily. 01/02/19   Margarita Mail, PA-C  clindamycin (CLEOCIN) 300 MG capsule Take 1 capsule (300 mg total) by mouth 3 (three) times daily for 5 days. 02/11/19 02/16/19  Henderly, Britni A, PA-C  fluconazole (DIFLUCAN) 150 MG tablet Take 1 tablet (150 mg total) by mouth daily. Take the second tablet after 72 hours if symptoms persist. 12/28/18   Sherene Sires, DO  fluconazole (DIFLUCAN) 150 MG tablet Take one tablet every 72 hours for 3 doses 01/02/19   Harris, Abigail, PA-C  glucose blood (FREESTYLE TEST STRIPS) test strip Check glucose 1 time per day and write result in logbook 12/28/18   Sherene Sires, DO  glucose monitoring kit (FREESTYLE) monitoring kit 1 each by Does not apply route as needed for other. 12/28/18   Sherene Sires, DO  Insulin Glargine (LANTUS) 100 UNIT/ML Solostar Pen Inject 10-30 Units into the skin every morning. 01/06/19   Hensel,  Jamal Collin, MD  ketoconazole (NIZORAL) 2 % cream Apply 1 application topically daily. 01/02/19   Margarita Mail, PA-C  Lancets (FREESTYLE) lancets Check glucose 1 time per day and write result in logbook 12/28/18   Sherene Sires, DO  metFORMIN (GLUCOPHAGE-XR) 500 MG 24 hr tablet Take 1 tablet (500 mg total) by mouth daily. 01/06/19   Zenia Resides, MD  Semaglutide,0.25 or 0.5MG/DOS, (OZEMPIC, 0.25 OR 0.5 MG/DOSE,) 2 MG/1.5ML SOPN Inject 0.25 mg into the skin once a week. 01/06/19   Zenia Resides, MD    Family History Family History  Problem Relation Age of Onset  . Diabetes Paternal Grandmother   . Hypertension Paternal Grandmother   . Diabetes Maternal Grandmother   . Diabetes Father   . Cancer Paternal Uncle     Social History Social History   Tobacco Use  . Smoking status:  Former Research scientist (life sciences)  . Smokeless tobacco: Never Used  Substance Use Topics  . Alcohol use: Yes    Comment: "occassionally"  . Drug use: No     Allergies   Patient has no known allergies.   Review of Systems Review of Systems  All other systems reviewed and are negative.    Physical Exam Updated Vital Signs BP 131/80   Pulse 83   Temp 98.6 F (37 C) (Oral)   Resp 16   Ht 5' 1"  (1.549 m)   LMP 02/01/2019 (Exact Date)   SpO2 100%   BMI 39.68 kg/m   Physical Exam Vitals signs and nursing note reviewed.  Constitutional:      Appearance: She is well-developed.  HENT:     Head: Normocephalic and atraumatic.  Eyes:     General: No scleral icterus.       Right eye: No discharge.        Left eye: No discharge.     Conjunctiva/sclera: Conjunctivae normal.     Pupils: Pupils are equal, round, and reactive to light.  Neck:     Musculoskeletal: Normal range of motion.     Vascular: No JVD.     Trachea: No tracheal deviation.  Pulmonary:     Effort: Pulmonary effort is normal.     Breath sounds: No stridor.  Skin:    Comments: Induration with incision with packing in place to the left pubic region, small amount of purulence no surrounding erythema  Neurological:     Mental Status: She is alert and oriented to person, place, and time.     Coordination: Coordination normal.  Psychiatric:        Behavior: Behavior normal.        Thought Content: Thought content normal.        Judgment: Judgment normal.      ED Treatments / Results  Labs (all labs ordered are listed, but only abnormal results are displayed) Labs Reviewed - No data to display  EKG None  Radiology No results found.  Procedures Procedures (including critical care time)  Medications Ordered in ED Medications - No data to display   Initial Impression / Assessment and Plan / ED Course  I have reviewed the triage vital signs and the nursing notes.  Pertinent labs & imaging results that were  available during my care of the patient were reviewed by me and considered in my medical decision making (see chart for details).        Patient here for wound check, wound appears to be healing appropriately no signs of surrounding cellulitis, packing removed.  Patient discharged with return precautions.  She verbalized understanding and agreement to today's plan had no further questions or concerns.  Final Clinical Impressions(s) / ED Diagnoses   Final diagnoses:  Wound check, abscess    ED Discharge Orders    None       Francee Gentile 02/14/19 1009    Lajean Saver, MD 02/14/19 1027

## 2019-02-14 NOTE — ED Notes (Signed)
Pt given dc instructions pt verbalizes understanding.  

## 2019-02-14 NOTE — Discharge Instructions (Signed)
Please read attached information. If you experience any new or worsening signs or symptoms please return to the emergency room for evaluation. Please follow-up with your primary care provider or specialist as discussed. Please use medication prescribed only as directed and discontinue taking if you have any concerning signs or symptoms.   °

## 2019-02-14 NOTE — ED Triage Notes (Signed)
Pt here to get abscess looked at

## 2019-02-17 NOTE — Telephone Encounter (Signed)
Called pt on 02/17/19 at 12:25 PM and left HIPAA compliant VM with instructions to call back Family Medicine office.

## 2019-02-21 ENCOUNTER — Encounter (HOSPITAL_COMMUNITY): Payer: Self-pay | Admitting: Emergency Medicine

## 2019-02-21 ENCOUNTER — Other Ambulatory Visit: Payer: Self-pay

## 2019-02-21 ENCOUNTER — Emergency Department (HOSPITAL_COMMUNITY)
Admission: EM | Admit: 2019-02-21 | Discharge: 2019-02-21 | Disposition: A | Discharge status: Home / Self Care | Payer: Self-pay | Attending: Emergency Medicine | Admitting: Emergency Medicine

## 2019-02-21 DIAGNOSIS — Z20828 Contact with and (suspected) exposure to other viral communicable diseases: Secondary | ICD-10-CM | POA: Insufficient documentation

## 2019-02-21 DIAGNOSIS — I10 Essential (primary) hypertension: Secondary | ICD-10-CM | POA: Insufficient documentation

## 2019-02-21 DIAGNOSIS — Z794 Long term (current) use of insulin: Secondary | ICD-10-CM | POA: Insufficient documentation

## 2019-02-21 DIAGNOSIS — A084 Viral intestinal infection, unspecified: Secondary | ICD-10-CM | POA: Insufficient documentation

## 2019-02-21 DIAGNOSIS — Z79899 Other long term (current) drug therapy: Secondary | ICD-10-CM | POA: Insufficient documentation

## 2019-02-21 DIAGNOSIS — E119 Type 2 diabetes mellitus without complications: Secondary | ICD-10-CM | POA: Insufficient documentation

## 2019-02-21 LAB — CBC
HCT: 36.7 % (ref 36.0–46.0)
Hemoglobin: 11.2 g/dL — ABNORMAL LOW (ref 12.0–15.0)
MCH: 23.8 pg — ABNORMAL LOW (ref 26.0–34.0)
MCHC: 30.5 g/dL (ref 30.0–36.0)
MCV: 78.1 fL — ABNORMAL LOW (ref 80.0–100.0)
Platelets: 415 10*3/uL — ABNORMAL HIGH (ref 150–400)
RBC: 4.7 MIL/uL (ref 3.87–5.11)
RDW: 16.6 % — ABNORMAL HIGH (ref 11.5–15.5)
WBC: 7.1 10*3/uL (ref 4.0–10.5)
nRBC: 0 % (ref 0.0–0.2)

## 2019-02-21 LAB — I-STAT BETA HCG BLOOD, ED (MC, WL, AP ONLY): I-stat hCG, quantitative: <5 m[IU]/mL (ref <5)

## 2019-02-21 LAB — COMPREHENSIVE METABOLIC PANEL
ALT: 17 U/L (ref 0–44)
AST: 13 U/L — ABNORMAL LOW (ref 15–41)
Albumin: 3.7 g/dL (ref 3.5–5.0)
Alkaline Phosphatase: 97 U/L (ref 38–126)
Anion gap: 10 (ref 5–15)
BUN: 7 mg/dL (ref 6–20)
CO2: 21 mmol/L — ABNORMAL LOW (ref 22–32)
Calcium: 9.2 mg/dL (ref 8.9–10.3)
Chloride: 104 mmol/L (ref 98–111)
Creatinine, Ser: 0.56 mg/dL (ref 0.44–1.00)
GFR calc Af Amer: >60 mL/min (ref >60)
GFR calc non Af Amer: >60 mL/min (ref >60)
Glucose, Bld: 176 mg/dL — ABNORMAL HIGH (ref 70–99)
Potassium: 3.8 mmol/L (ref 3.5–5.1)
Sodium: 135 mmol/L (ref 135–145)
Total Bilirubin: 0.7 mg/dL (ref 0.3–1.2)
Total Protein: 7.8 g/dL (ref 6.5–8.1)

## 2019-02-21 LAB — LIPASE, BLOOD: Lipase: 29 U/L (ref 11–51)

## 2019-02-21 MED ORDER — SODIUM CHLORIDE 0.9% FLUSH
3.0000 mL | Freq: Once | INTRAVENOUS | Status: AC
  Administered 2019-02-21: 13:00:00 3 mL via INTRAVENOUS

## 2019-02-21 MED ORDER — ONDANSETRON HCL 4 MG/2ML IJ SOLN
4.0000 mg | Freq: Once | INTRAMUSCULAR | Status: AC
  Administered 2019-02-21: 13:00:00 4 mg via INTRAVENOUS
  Filled 2019-02-21: qty 2

## 2019-02-21 NOTE — ED Notes (Signed)
Pt reports she lost her sense of taste and smell yesterday.

## 2019-02-21 NOTE — Discharge Instructions (Signed)
Please read attached information. If you experience any new or worsening signs or symptoms please return to the emergency room for evaluation. Please follow-up with your primary care provider or specialist as discussed. Please use medication prescribed only as directed and discontinue taking if you have any concerning signs or symptoms.   °

## 2019-02-21 NOTE — ED Provider Notes (Signed)
[facility] EMERGENCY DEPARTMENT Provider Note   CSN: [id number] Arrival date & time: 02/21/19  7902     History   Chief Complaint Chief Complaint  Patient presents with  . Emesis    HPI [name] [name] is a 33 y.o. female.     HPI   33 year old female presents today with complaints of nausea vomiting diarrhea.  Patient notes symptoms started symptoms 3 days ago with some bloody nonbilious emesis and nonbloody diarrhea.  She notes very minimal epigastric pain with vomiting, none at rest.  She denies any fever runny nose nasal congestion or cough.  She denies any close sick contacts.  No abnormal food or drink or recent abnormal exposures.  No medications prior to arrival.  She reports she is not pregnant or breast-feeding.  Past Medical History:  Diagnosis Date  . Abnormal Pap smear   . Diabetes mellitus    type II  . GBS (group B streptococcus) UTI complicating pregnancy   . Herpes simplex without mention of complication   . Hypertension   . Irregular menses   . Obesity     Patient Active Problem List   Diagnosis Date Noted  . Screening for malignant neoplasm of cervix 12/30/2018  . Type 2 diabetes mellitus without complication, without long-term current use of insulin ([place]) 12/30/2018  . Chest pain 05/20/2017  . History of tubal ligation   . Medication refill 07/22/2016  . Vaginal discharge 05/16/2016  . Constipation 03/04/2016  . Yeast infection 02/11/2016  . Alopecia areata 09/29/2013  . Type II diabetes mellitus, uncontrolled ([place]) 09/20/2007  . 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 Obesity 07/09/2006  . RHINITIS, ALLERGIC 07/09/2006  . PAPANICOLAOU SMEAR, ABNORMAL 07/09/2006    Past Surgical History:  Procedure Laterality Date  . CESAREAN SECTION  01/30/2011   Procedure: CESAREAN SECTION;  Surgeon: [name], MD;  Location: [facility];  Service: Gynecology;  Laterality: N/A;  . CESAREAN SECTION N/A 10/22/2014   Procedure: CESAREAN SECTION;  Surgeon: [name], MD;  Location: [facility];  Service: Obstetrics;  Laterality: N/A;  . TUBAL LIGATION    . WISDOM TOOTH EXTRACTION       OB History    Gravida  4   Para  2   Term  1   Preterm  1   AB  2   Living  2     SAB  2   TAB      Ectopic      Multiple  0   Live Births  2            Home Medications    Prior to Admission medications   Medication Sig Start Date End Date Taking? Authorizing Provider  acyclovir (ZOVIRAX) 400 MG tablet Take 1 tablet (400 mg total) by mouth 4 (four) times daily. 01/02/19   Margarita Mail, PA-C  fluconazole (DIFLUCAN) 150 MG tablet Take 1 tablet (150 mg total) by mouth daily. Take the second tablet after 72 hours if symptoms persist. 12/28/18   Sherene Sires, DO  fluconazole (DIFLUCAN) 150 MG tablet Take one tablet every 72 hours for 3 doses 01/02/19   Harris, Abigail, PA-C  glucose blood (FREESTYLE TEST STRIPS) test strip Check glucose 1 time per day and write result in logbook 12/28/18   Sherene Sires, DO  glucose monitoring kit (FREESTYLE) monitoring kit 1 each by Does not apply route as needed for other. 12/28/18   Sherene Sires, DO  Insulin Glargine (  LANTUS) 100 UNIT/ML Solostar Pen Inject 10-30 Units into the skin every morning. 01/06/19   Hensel, Jamal Collin, MD  ketoconazole (NIZORAL) 2 % cream Apply 1 application topically daily. 01/02/19   Margarita Mail, PA-C  Lancets (FREESTYLE) lancets Check glucose 1 time per day and write result in logbook 12/28/18   Sherene Sires, DO  metFORMIN (GLUCOPHAGE-XR) 500 MG 24 hr tablet Take 1 tablet (500 mg total) by mouth daily. 01/06/19   Zenia Resides, MD  Semaglutide,0.25 or 0.5MG/DOS, (OZEMPIC, 0.25 OR 0.5 MG/DOSE,) 2 MG/1.5ML SOPN Inject 0.25 mg into the skin once a week. 01/06/19   Zenia Resides, MD    Family History Family History  Problem Relation Age of Onset  . Diabetes Paternal Grandmother   . Hypertension Paternal Grandmother   . Diabetes  Maternal Grandmother   . Diabetes Father   . Cancer Paternal Uncle     Social History Social History   Tobacco Use  . Smoking status: Former Research scientist (life sciences)  . Smokeless tobacco: Never Used  Substance Use Topics  . Alcohol use: Yes    Comment: "occassionally"  . Drug use: No     Allergies   Patient has no known allergies.   Review of Systems Review of Systems  All other systems reviewed and are negative.    Physical Exam Updated Vital Signs BP 134/77   Pulse 73   Temp 98.5 F (36.9 C) (Oral)   Resp 16   Ht 5' 1"  (1.549 m)   Wt 95.3 kg   LMP 02/01/2019 (Exact Date)   SpO2 100%   BMI 39.68 kg/m   Physical Exam Vitals signs and nursing note reviewed.  Constitutional:      Appearance: She is well-developed.  HENT:     Head: Normocephalic and atraumatic.  Eyes:     General: No scleral icterus.       Right eye: No discharge.        Left eye: No discharge.     Conjunctiva/sclera: Conjunctivae normal.     Pupils: Pupils are equal, round, and reactive to light.  Neck:     Musculoskeletal: Normal range of motion.     Vascular: No JVD.     Trachea: No tracheal deviation.  Pulmonary:     Effort: Pulmonary effort is normal.     Breath sounds: No stridor.  Abdominal:     Comments: Abdomen soft nontender  Neurological:     Mental Status: She is alert and oriented to person, place, and time.     Coordination: Coordination normal.  Psychiatric:        Behavior: Behavior normal.        Thought Content: Thought content normal.        Judgment: Judgment normal.      ED Treatments / Results  Labs (all labs ordered are listed, but only abnormal results are displayed) Labs Reviewed  COMPREHENSIVE METABOLIC PANEL - Abnormal; Notable for the following components:      Result Value   CO2 21 (*)    Glucose, Bld 176 (*)    AST 13 (*)    All other components within normal limits  CBC - Abnormal; Notable for the following components:   Hemoglobin 11.2 (*)    MCV 78.1  (*)    MCH 23.8 (*)    RDW 16.6 (*)    Platelets 415 (*)    All other components within normal limits  NOVEL CORONAVIRUS, NAA (HOSP ORDER, SEND-OUT TO REF LAB; TAT  18-24 HRS)  LIPASE, BLOOD  URINALYSIS, ROUTINE W REFLEX MICROSCOPIC  I-STAT BETA HCG BLOOD, ED ([facility], [facility], [facility] ONLY)    EKG None  Radiology No results found.  Procedures Procedures (including critical care time)  Medications Ordered in ED Medications  sodium chloride flush (NS) 0.9 % injection 3 mL (3 mLs Intravenous Given 02/21/19 1246)  ondansetron (ZOFRAN) injection 4 mg (4 mg Intravenous Given 02/21/19 1245)     Initial Impression / Assessment and Plan / ED Course  I have reviewed the triage vital signs and the nursing notes.  Pertinent labs & imaging results that were available during my care of the patient were reviewed by me and considered in my medical decision making (see chart for details).          Assessment/Plan: 33 year old female presents today with likely viral gastroenteritis.  She is very well-appearing no acute distress.  She has reassuring laboratory analysis with signs of bacterial etiology.  She does have GI symptoms, Cobra test sent.  She is tolerating p.o. return precautions given.  Verbalized understanding and agreement to this plan had no further questions concerns at time of discharge.   Final Clinical Impressions(s) / ED Diagnoses   Final diagnoses:  Viral gastroenteritis    ED Discharge Orders    None       Francee Gentile 02/21/19 1530    Pattricia Boss, MD 02/21/19 1819

## 2019-02-21 NOTE — ED Triage Notes (Signed)
Pt reports emesis, generalized abdominal pain and subjective fever since Friday. Denies sick contacts. VSS.

## 2019-02-22 LAB — NOVEL CORONAVIRUS, NAA (HOSP ORDER, SEND-OUT TO REF LAB; TAT 18-24 HRS): SARS-CoV-2, NAA: NOT DETECTED

## 2019-03-10 ENCOUNTER — Emergency Department (HOSPITAL_COMMUNITY)
Admission: EM | Admit: 2019-03-10 | Discharge: 2019-03-10 | Disposition: A | Discharge status: Home / Self Care | Payer: Self-pay | Attending: Emergency Medicine | Admitting: Emergency Medicine

## 2019-03-10 ENCOUNTER — Other Ambulatory Visit: Payer: Self-pay

## 2019-03-10 ENCOUNTER — Emergency Department (HOSPITAL_COMMUNITY): Payer: Self-pay

## 2019-03-10 ENCOUNTER — Encounter (HOSPITAL_COMMUNITY): Payer: Self-pay | Admitting: Emergency Medicine

## 2019-03-10 DIAGNOSIS — Z794 Long term (current) use of insulin: Secondary | ICD-10-CM | POA: Insufficient documentation

## 2019-03-10 DIAGNOSIS — K2900 Acute gastritis without bleeding: Secondary | ICD-10-CM | POA: Insufficient documentation

## 2019-03-10 DIAGNOSIS — I10 Essential (primary) hypertension: Secondary | ICD-10-CM | POA: Insufficient documentation

## 2019-03-10 DIAGNOSIS — Z87891 Personal history of nicotine dependence: Secondary | ICD-10-CM | POA: Insufficient documentation

## 2019-03-10 DIAGNOSIS — R112 Nausea with vomiting, unspecified: Secondary | ICD-10-CM | POA: Insufficient documentation

## 2019-03-10 DIAGNOSIS — R1011 Right upper quadrant pain: Secondary | ICD-10-CM | POA: Insufficient documentation

## 2019-03-10 DIAGNOSIS — E119 Type 2 diabetes mellitus without complications: Secondary | ICD-10-CM | POA: Insufficient documentation

## 2019-03-10 DIAGNOSIS — R1013 Epigastric pain: Secondary | ICD-10-CM | POA: Insufficient documentation

## 2019-03-10 DIAGNOSIS — Z79899 Other long term (current) drug therapy: Secondary | ICD-10-CM | POA: Insufficient documentation

## 2019-03-10 LAB — URINALYSIS, ROUTINE W REFLEX MICROSCOPIC
Bilirubin Urine: NEGATIVE
Glucose, UA: NEGATIVE mg/dL
Hgb urine dipstick: NEGATIVE
Ketones, ur: NEGATIVE mg/dL
Leukocytes,Ua: NEGATIVE
Nitrite: POSITIVE — AB
Protein, ur: NEGATIVE mg/dL
Specific Gravity, Urine: 1.016 (ref 1.005–1.030)
pH: 6 (ref 5.0–8.0)

## 2019-03-10 LAB — WET PREP, GENITAL
Clue Cells Wet Prep HPF POC: NONE SEEN
Sperm: NONE SEEN
Trich, Wet Prep: NONE SEEN
Yeast Wet Prep HPF POC: NONE SEEN

## 2019-03-10 LAB — I-STAT BETA HCG BLOOD, ED (MC, WL, AP ONLY): I-stat hCG, quantitative: <5 m[IU]/mL (ref <5)

## 2019-03-10 LAB — COMPREHENSIVE METABOLIC PANEL
ALT: 17 U/L (ref 0–44)
AST: 13 U/L — ABNORMAL LOW (ref 15–41)
Albumin: 3.5 g/dL (ref 3.5–5.0)
Alkaline Phosphatase: 95 U/L (ref 38–126)
Anion gap: 10 (ref 5–15)
BUN: 9 mg/dL (ref 6–20)
CO2: 23 mmol/L (ref 22–32)
Calcium: 8.9 mg/dL (ref 8.9–10.3)
Chloride: 102 mmol/L (ref 98–111)
Creatinine, Ser: 0.6 mg/dL (ref 0.44–1.00)
GFR calc Af Amer: >60 mL/min (ref >60)
GFR calc non Af Amer: >60 mL/min (ref >60)
Glucose, Bld: 231 mg/dL — ABNORMAL HIGH (ref 70–99)
Potassium: 4.3 mmol/L (ref 3.5–5.1)
Sodium: 135 mmol/L (ref 135–145)
Total Bilirubin: 0.5 mg/dL (ref 0.3–1.2)
Total Protein: 7.1 g/dL (ref 6.5–8.1)

## 2019-03-10 LAB — CBC
HCT: 34.6 % — ABNORMAL LOW (ref 36.0–46.0)
Hemoglobin: 10.5 g/dL — ABNORMAL LOW (ref 12.0–15.0)
MCH: 23.7 pg — ABNORMAL LOW (ref 26.0–34.0)
MCHC: 30.3 g/dL (ref 30.0–36.0)
MCV: 78.1 fL — ABNORMAL LOW (ref 80.0–100.0)
Platelets: 443 10*3/uL — ABNORMAL HIGH (ref 150–400)
RBC: 4.43 MIL/uL (ref 3.87–5.11)
RDW: 16.2 % — ABNORMAL HIGH (ref 11.5–15.5)
WBC: 8 10*3/uL (ref 4.0–10.5)
nRBC: 0 % (ref 0.0–0.2)

## 2019-03-10 LAB — LIPASE, BLOOD: Lipase: 35 U/L (ref 11–51)

## 2019-03-10 MED ORDER — SODIUM CHLORIDE 0.9% FLUSH: 3.0000 mL | Freq: Once | INTRAVENOUS | Status: DC

## 2019-03-10 MED ORDER — FAMOTIDINE 20 MG PO TABS
20.0000 mg | ORAL_TABLET | Freq: Once | ORAL | Status: AC
  Administered 2019-03-10: 20 mg via ORAL
  Filled 2019-03-10: qty 1

## 2019-03-10 MED ORDER — ONDANSETRON 4 MG PO TBDP
8.0000 mg | ORAL_TABLET | Freq: Once | ORAL | Status: AC
  Administered 2019-03-10: 8 mg via ORAL
  Filled 2019-03-10: qty 2

## 2019-03-10 MED ORDER — ONDANSETRON 4 MG PO TBDP: 4.0000 mg | ORAL_TABLET | Freq: Three times a day (TID) | ORAL | 0 refills | Status: DC | PRN

## 2019-03-10 MED ORDER — FAMOTIDINE 20 MG PO TABS: 20.0000 mg | ORAL_TABLET | Freq: Two times a day (BID) | ORAL | 0 refills | Status: DC

## 2019-03-10 MED ORDER — ALUM & MAG HYDROXIDE-SIMETH 200-200-20 MG/5ML PO SUSP
30.0000 mL | Freq: Once | ORAL | Status: AC
  Administered 2019-03-10: 30 mL via ORAL
  Filled 2019-03-10: qty 30

## 2019-03-10 NOTE — ED Notes (Signed)
Patient verbalizes understanding of discharge instructions . Opportunity for questions and answers were provided . Armband removed by staff ,Pt discharged from ED. W/C  offered at D/C  and Declined W/C at D/C and was escorted to lobby by RN.  

## 2019-03-10 NOTE — Discharge Instructions (Signed)
Please take Zofran as needed for nausea Take Pepcid as needed for abdominal pain Please try diet changes to help with your symptoms Return if you are worsening

## 2019-03-10 NOTE — ED Triage Notes (Addendum)
Pt in with c/o generalized abdominal pain x 3 days. C/o n/v as well, denies diarrhea. States worse with greasy foods. Denies any fevers or sick contacts. Also c/o irregular vaginal bleeding

## 2019-03-10 NOTE — ED Provider Notes (Signed)
[facility] EMERGENCY DEPARTMENT Provider Note   CSN: [id number] Arrival date & time: 03/10/19  2947   History   Chief Complaint Chief Complaint  Patient presents with  . Abdominal Pain    HPI [name] [name] is a 33 y.o. female with hx of obesity and diabetes who presents with abdominal pain. She reports that she was seen in the ED several weeks ago with the same symptoms. She was diagnosed with GI illness which resolved and tested for COVID and that was negative. About 2-3 days ago she started vomiting again. It occurred about 2-3 times a day and she reports associated abdominal pain. The pain is in the epigastric area and is sharp and non-radiating. It's worse with eating acidic foods such as lasagna and fatty foods. She tried Ibuprofen with no relief. She tried Tums with mild relief. She also has pain over the suprapubic area which she attributes to vomiting forcefully. She reports associated nausea. She denies fever, chills, chest pain, SOB, flank pain, diarrhea, constipation, urinary symptoms, vaginal discharge. She had a normal period that ended last Friday however about 2 days ago she started having vaginal bleeding again which was like a period. This has since gone away. She denies dyspareunia. She is sexually active. She reports surgical hx of tubal ligation and C-section.    HPI  Past Medical History:  Diagnosis Date  . Abnormal Pap smear   . Diabetes mellitus    type II  . GBS (group B streptococcus) UTI complicating pregnancy   . Herpes simplex without mention of complication   . Hypertension   . Irregular menses   . Obesity     Patient Active Problem List   Diagnosis Date Noted  . Screening for malignant neoplasm of cervix 12/30/2018  . Type 2 diabetes mellitus without complication, without long-term current use of insulin ([place]) 12/30/2018  . Chest pain 05/20/2017  . History of tubal ligation   . Medication refill 07/22/2016  . Vaginal discharge  05/16/2016  . Constipation 03/04/2016  . Yeast infection 02/11/2016  . Alopecia areata 09/29/2013  . Type II diabetes mellitus, uncontrolled ([place]) 09/20/2007  . 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 Obesity 07/09/2006  . RHINITIS, ALLERGIC 07/09/2006  . PAPANICOLAOU SMEAR, ABNORMAL 07/09/2006    Past Surgical History:  Procedure Laterality Date  . CESAREAN SECTION  01/30/2011   Procedure: CESAREAN SECTION;  Surgeon: [name], MD;  Location: [facility];  Service: Gynecology;  Laterality: N/A;  . CESAREAN SECTION N/A 10/22/2014   Procedure: CESAREAN SECTION;  Surgeon: [name], MD;  Location: [facility];  Service: Obstetrics;  Laterality: N/A;  . TUBAL LIGATION    . WISDOM TOOTH EXTRACTION       OB History    Gravida  4   Para  2   Term  1   Preterm  1   AB  2   Living  2     SAB  2   TAB      Ectopic      Multiple  0   Live Births  2            Home Medications    Prior to Admission medications   Medication Sig Start Date End Date Taking? Authorizing Provider  acyclovir (ZOVIRAX) 400 MG tablet Take 1 tablet (400 mg total) by mouth 4 (four) times daily. 01/02/19   Margarita Mail, PA-C  fluconazole (DIFLUCAN) 150 MG tablet Take 1 tablet (150  mg total) by mouth daily. Take the second tablet after 72 hours if symptoms persist. 12/28/18   Sherene Sires, DO  fluconazole (DIFLUCAN) 150 MG tablet Take one tablet every 72 hours for 3 doses 01/02/19   Harris, Abigail, PA-C  glucose blood (FREESTYLE TEST STRIPS) test strip Check glucose 1 time per day and write result in logbook 12/28/18   Sherene Sires, DO  glucose monitoring kit (FREESTYLE) monitoring kit 1 each by Does not apply route as needed for other. 12/28/18   Sherene Sires, DO  Insulin Glargine (LANTUS) 100 UNIT/ML Solostar Pen Inject 10-30 Units into the skin every morning. 01/06/19   Hensel, Jamal Collin, MD  ketoconazole (NIZORAL) 2 % cream Apply 1 application topically daily. 01/02/19   Margarita Mail, PA-C  Lancets (FREESTYLE) lancets Check glucose 1 time per day and write result in logbook 12/28/18   Sherene Sires, DO  metFORMIN (GLUCOPHAGE-XR) 500 MG 24 hr tablet Take 1 tablet (500 mg total) by mouth daily. 01/06/19   Zenia Resides, MD  Semaglutide,0.25 or 0.5MG/DOS, (OZEMPIC, 0.25 OR 0.5 MG/DOSE,) 2 MG/1.5ML SOPN Inject 0.25 mg into the skin once a week. 01/06/19   Zenia Resides, MD    Family History Family History  Problem Relation Age of Onset  . Diabetes Paternal Grandmother   . Hypertension Paternal Grandmother   . Diabetes Maternal Grandmother   . Diabetes Father   . Cancer Paternal Uncle     Social History Social History   Tobacco Use  . Smoking status: Former Research scientist (life sciences)  . Smokeless tobacco: Never Used  Substance Use Topics  . Alcohol use: Yes    Comment: "occassionally"  . Drug use: No     Allergies   Patient has no known allergies.   Review of Systems Review of Systems  Constitutional: Negative for chills and fever.  Respiratory: Negative for shortness of breath.   Cardiovascular: Negative for chest pain.  Gastrointestinal: Positive for abdominal pain, nausea and vomiting. Negative for blood in stool, constipation and diarrhea.  Genitourinary: Positive for menstrual problem and vaginal bleeding. Negative for difficulty urinating, dysuria, frequency, hematuria, pelvic pain, vaginal discharge and vaginal pain.  All other systems reviewed and are negative.    Physical Exam Updated Vital Signs BP (!) 141/89 (BP Location: Right Arm)   Pulse 66   Temp 99.1 F (37.3 C) (Oral)   Resp 16   Wt 95.3 kg   LMP 03/03/2019   SpO2 98%   BMI 39.70 kg/m   Physical Exam Vitals signs and nursing note reviewed.  Constitutional:      General: She is not in acute distress.    Appearance: She is well-developed. She is obese. She is not ill-appearing.     Comments: Calm, cooperative. NAD. On phone  HENT:     Head: Normocephalic and atraumatic.  Eyes:      General: No scleral icterus.       Right eye: No discharge.        Left eye: No discharge.     Conjunctiva/sclera: Conjunctivae normal.     Pupils: Pupils are equal, round, and reactive to light.  Neck:     Musculoskeletal: Normal range of motion.  Cardiovascular:     Rate and Rhythm: Normal rate and regular rhythm.  Pulmonary:     Effort: Pulmonary effort is normal. No respiratory distress.     Breath sounds: Normal breath sounds.  Abdominal:     General: There is no distension.  Palpations: Abdomen is soft. There is no mass.     Tenderness: There is abdominal tenderness (epigastric and suprapubic ). There is no right CVA tenderness, left CVA tenderness, guarding or rebound.     Hernia: No hernia is present.  Genitourinary:    Comments: Pelvic: No inguinal lymphadenopathy or inguinal hernia noted. Normal external genitalia. No pain with speculum insertion. Closed cervical os with normal appearance - no rash or lesions. No significant discharge or bleeding noted from cervix or in vaginal vault. On bimanual examination no adnexal tenderness or cervical motion tenderness. Chaperone present during exam.  Skin:    General: Skin is warm and dry.  Neurological:     Mental Status: She is alert and oriented to person, place, and time.  Psychiatric:        Behavior: Behavior normal.      ED Treatments / Results  Labs (all labs ordered are listed, but only abnormal results are displayed) Labs Reviewed  COMPREHENSIVE METABOLIC PANEL - Abnormal; Notable for the following components:      Result Value   Glucose, Bld 231 (*)    AST 13 (*)    All other components within normal limits  CBC - Abnormal; Notable for the following components:   Hemoglobin 10.5 (*)    HCT 34.6 (*)    MCV 78.1 (*)    MCH 23.7 (*)    RDW 16.2 (*)    Platelets 443 (*)    All other components within normal limits  URINALYSIS, ROUTINE W REFLEX MICROSCOPIC - Abnormal; Notable for the following components:    APPearance HAZY (*)    Nitrite POSITIVE (*)    Bacteria, UA MANY (*)    All other components within normal limits  WET PREP, GENITAL  LIPASE, BLOOD  I-STAT BETA HCG BLOOD, ED ([facility], [facility], [facility] ONLY)  GC/CHLAMYDIA PROBE AMP ([place]) NOT AT [facility]    EKG None  Radiology US Abdomen Limited Ruq  Result Date: 03/10/2019 CLINICAL DATA:  Right upper quadrant pain. EXAM: ULTRASOUND ABDOMEN LIMITED RIGHT UPPER QUADRANT COMPARISON:  No prior. FINDINGS: Gallbladder: No gallstones or wall thickening visualized. No sonographic Murphy sign noted by sonographer. Common bile duct: Diameter: 3.4 mm Liver: Increased echogenicity consistent fatty infiltration or hepatocellular disease. Portal vein is patent on color Doppler imaging with normal direction of blood flow towards the liver. Other: Limited exam due to body habitus. IMPRESSION: No acute abnormality identified. Electronically Signed   By: Marcello Moores  Register   On: 03/10/2019 12:30    Procedures Procedures (including critical care time)  Medications Ordered in ED Medications  sodium chloride flush (NS) 0.9 % injection 3 mL (has no administration in time range)  ondansetron (ZOFRAN-ODT) disintegrating tablet 8 mg (8 mg Oral Given 03/10/19 1205)  famotidine (PEPCID) tablet 20 mg (20 mg Oral Given 03/10/19 1205)  alum & mag hydroxide-simeth (MAALOX/MYLANTA) 200-200-20 MG/5ML suspension 30 mL (30 mLs Oral Given 03/10/19 1205)     Initial Impression / Assessment and Plan / ED Course  I have reviewed the triage vital signs and the nursing notes.  Pertinent labs & imaging results that were available during my care of the patient were reviewed by me and considered in my medical decision making (see chart for details).  33 year old female presents with abdominal pain, N/V. DDx includes PUD, gastritis, biliary colic, GI illness, UTI, PID. She is mildly hypertensive but otherwise vitals are normal. Abdomen is tender in epigastric and suprapubic area. CBC is  remarkable for  anemia (10.5). CMP is remarkable for hyperglycemia (231). LFTs and lipase are normal. UA shows positive nitrites and many bacteria but no WBC. Culture was sent. RUQ [place] was obtained which was normal. Pelvic exam completed was normal with normal Wet prep. She was given a gi cocktail, Pepcid, Zofran and felt better and tolerated PO. Repeat abdominal exam is benign. Symptoms are likely from gastritis. She was advised to change her diet as she eats a lot of acidic and fatty foods. She was given rx for Zofran and Pepcid. Will hold off on treatment of UTI as she doesn't have urinary symptoms but did have some transient suprapubic pain. Advised return if worsening  Final Clinical Impressions(s) / ED Diagnoses   Final diagnoses:  RUQ abdominal pain  Epigastric abdominal pain  Acute gastritis without hemorrhage, unspecified gastritis type    ED Discharge Orders    None       Recardo Evangelist, PA-C 03/10/19 1402    Milton Ferguson, MD 03/12/19 [phone]

## 2019-03-10 NOTE — ED Notes (Signed)
Patient transported to Ultrasound 

## 2019-03-11 ENCOUNTER — Inpatient Hospital Stay (HOSPITAL_COMMUNITY)
Admission: AD | Admit: 2019-03-11 | Discharge: 2019-03-11 | Disposition: A | Discharge status: Home / Self Care | Payer: Self-pay | Attending: Obstetrics and Gynecology | Admitting: Obstetrics and Gynecology

## 2019-03-11 ENCOUNTER — Other Ambulatory Visit: Payer: Self-pay

## 2019-03-11 DIAGNOSIS — Z3202 Encounter for pregnancy test, result negative: Secondary | ICD-10-CM | POA: Insufficient documentation

## 2019-03-11 LAB — GC/CHLAMYDIA PROBE AMP (~~LOC~~) NOT AT ARMC
Chlamydia: NEGATIVE
Neisseria Gonorrhea: NEGATIVE

## 2019-03-11 LAB — POCT PREGNANCY, URINE: Preg Test, Ur: NEGATIVE

## 2019-03-11 NOTE — MAU Note (Signed)
Pt presents with 3 day hx of lower abdominal pain and vomiting.  Pain is 8/10.  Took ibuprofen 200mg  that didn't help.  Also c/o vaginal spotting.  LMP end of September.  HPT negative.  Istat in ED negative for pregnancy.  Denies fever or diarrhea.

## 2019-03-11 NOTE — MAU Provider Note (Addendum)
S Ms. SHERLONDA FLATER is a 33 y.o. [phone] female who presents to MAU today with complaint of LAP and amenorrhea.   O BP 136/84 (BP Location: Right Arm)   Pulse 87   Temp 98.7 F (37.1 C) (Oral)   Resp 18   Wt 92.4 kg   LMP 02/07/2019 (Approximate)   BMI 38.51 kg/m  Physical Exam  Nursing note and vitals reviewed. Constitutional: She is oriented to person, place, and time. She appears well-developed and well-nourished. No distress.  HENT:  Head: Normocephalic and atraumatic.  Neck: Normal range of motion.  Cardiovascular: Normal rate.  Respiratory: Effort normal. No respiratory distress.  Musculoskeletal: Normal range of motion.  Neurological: She is alert and oriented to person, place, and time.  Psychiatric: She has a normal mood and affect.  UPT-neg  A Non pregnant female Medical screening exam complete  P Discharge from MAU in stable condition Patient given the option of transfer to [facility] or UC, for further evaluation or seek care in outpatient facility of choice- pt prefers to f/u with [facility] List of options for follow-up given  Warning signs for worsening condition that would warrant emergency follow-up discussed Patient may return to MAU as needed for pregnancy related complaints  Julianne Handler, CNM 03/11/2019 1:07 PM

## 2019-03-13 LAB — URINE CULTURE: Culture: >=100000 — AB

## 2019-03-14 ENCOUNTER — Ambulatory Visit (INDEPENDENT_AMBULATORY_CARE_PROVIDER_SITE_OTHER): Payer: Self-pay | Admitting: Family Medicine

## 2019-03-14 ENCOUNTER — Telehealth: Payer: Self-pay | Admitting: Emergency Medicine

## 2019-03-14 ENCOUNTER — Other Ambulatory Visit: Payer: Self-pay

## 2019-03-14 ENCOUNTER — Encounter: Payer: Self-pay | Admitting: Family Medicine

## 2019-03-14 DIAGNOSIS — R109 Unspecified abdominal pain: Secondary | ICD-10-CM | POA: Insufficient documentation

## 2019-03-14 DIAGNOSIS — R1084 Generalized abdominal pain: Secondary | ICD-10-CM

## 2019-03-14 NOTE — Telephone Encounter (Signed)
Post ED Visit - Positive Culture Follow-up  Culture report reviewed by antimicrobial stewardship pharmacist: [place] Team []  Elenor Quinones, Pharm.D. [x]  Heide Guile, Pharm.D., BCPS AQ-ID []  Parks Neptune, Pharm.D., BCPS []  Alycia Rossetti, Pharm.D., BCPS []  [place], Pharm.D., BCPS, AAHIVP []  Legrand Como, Pharm.D., BCPS, AAHIVP []  Salome Arnt, PharmD, BCPS []  Johnnette Gourd, PharmD, BCPS []  Hughes Better, PharmD, BCPS []  Leeroy Cha, PharmD []  Laqueta Linden, PharmD, BCPS []  Albertina Parr, PharmD  [place] Team []  Leodis Sias, PharmD []  Lindell Spar, PharmD []  Royetta Asal, PharmD []  Graylin Shiver, Rph []  Rema Fendt) Glennon Mac, PharmD []  Arlyn Dunning, PharmD []  Netta Cedars, PharmD []  Dia Sitter, PharmD []  Leone Haven, PharmD []  Gretta Arab, PharmD []  Theodis Shove, PharmD []  Peggyann Juba, PharmD []  Reuel Boom, PharmD   Positive urine culture Treated with none, asymptomatic,  no further patient follow-up is required at this time.  Hazle Nordmann 03/14/2019, 12:47 PM

## 2019-03-14 NOTE — Assessment & Plan Note (Signed)
Possible viral gastro since is resolving.  Seemed to start around time start Ozempic which can cause GI side effects.  Lab work up including preg test normal as was RUQ [place] Will observe - see after visit summary for plans

## 2019-03-14 NOTE — Telephone Encounter (Signed)
Post ED Visit - Positive Culture Follow-up  Culture report reviewed by antimicrobial stewardship pharmacist: [place] Team []  Elenor Quinones, Pharm.D. [x]  Heide Guile, Pharm.D., BCPS AQ-ID []  Parks Neptune, Pharm.D., BCPS []  Alycia Rossetti, Pharm.D., BCPS []  [place], Pharm.D., BCPS, AAHIVP []  Legrand Como, Pharm.D., BCPS, AAHIVP []  Salome Arnt, PharmD, BCPS []  Johnnette Gourd, PharmD, BCPS []  Hughes Better, PharmD, BCPS []  Leeroy Cha, PharmD []  Laqueta Linden, PharmD, BCPS []  Albertina Parr, PharmD  [place] Team []  Leodis Sias, PharmD []  Lindell Spar, PharmD []  Royetta Asal, PharmD []  Graylin Shiver, Rph []  Rema Fendt) Glennon Mac, PharmD []  Arlyn Dunning, PharmD []  Netta Cedars, PharmD []  Dia Sitter, PharmD []  Leone Haven, PharmD []  Gretta Arab, PharmD []  Theodis Shove, PharmD []  Peggyann Juba, PharmD []  Reuel Boom, PharmD   Positive urine culture Treated with none, asymptomatic, no further patient follow-up is required at this time.  Hazle Nordmann 03/14/2019, 1:09 PM

## 2019-03-14 NOTE — Patient Instructions (Addendum)
Good to see you today!  Thanks for coming in.  If things are getting worse - worse pain or worse vomiting or fever then call us right away  If your symptoms stay the same then come back in one week for another pregnancy test   If things continue to slowly improve then skip the next dose of Ozempic on Thursday 11/5.  If all your symptoms are gone by the next Thursday 11/12 then take the dose of Ozempic   Your blood count showed a mild anemia.  This should be rechecked in one month  Make an appointment to see Dr Ouida Sills in one month

## 2019-03-14 NOTE — Progress Notes (Signed)
Subjective  [name] [name] is a 33 y.o. female is presenting with the following  ABDOMINAL PAIN AND VOMITING Started about a week ago.  Had episode of gastroenteritis in mid Oct but that had resolved.  Has pain first in upper abdomen now moslty in lower.  Associated with nausea and vomiting.  No bleeding or change in bowel movement. No fever or rash  Seen in ER 10/29 and MAU 10/30 and is better since then.  Feels sort of like she could be pregnant.  Had BTL and recent neg preg test. Started on Ozempic took second shot on last Thursday.    Chief Complaint noted Review of Symptoms - see HPI PMH - Smoking status noted.   Recent lab work and [place] reviewed  Objective Vital Signs reviewed BP 136/82   Pulse 82   Wt 209 lb (94.8 kg)   LMP 03/03/2019   SpO2 98%   BMI 39.49 kg/m  Alert nad Abdomen - mild lower abdomen tenderness with no guarding or rebound or masses No CVAT Heart - Regular rate and rhythm.  No murmurs, gallops or rubs.    Lungs:  Normal respiratory effort, chest expands symmetrically. Lungs are clear to auscultation, no crackles or wheezes.  Assessments/Plans  Abdominal pain Possible viral gastro since is resolving.  Seemed to start around time start Ozempic which can cause GI side effects.  Lab work up including preg test normal as was RUQ [place] Will observe - see after visit summary for plans

## 2019-03-22 ENCOUNTER — Encounter (HOSPITAL_COMMUNITY): Payer: Self-pay | Admitting: Emergency Medicine

## 2019-03-22 ENCOUNTER — Other Ambulatory Visit: Payer: Self-pay

## 2019-03-22 ENCOUNTER — Emergency Department (HOSPITAL_COMMUNITY)
Admission: EM | Admit: 2019-03-22 | Discharge: 2019-03-22 | Disposition: A | Discharge status: Home / Self Care | Payer: Self-pay | Attending: Emergency Medicine | Admitting: Emergency Medicine

## 2019-03-22 ENCOUNTER — Telehealth: Payer: Self-pay | Admitting: Pharmacist

## 2019-03-22 DIAGNOSIS — Z794 Long term (current) use of insulin: Secondary | ICD-10-CM | POA: Insufficient documentation

## 2019-03-22 DIAGNOSIS — E119 Type 2 diabetes mellitus without complications: Secondary | ICD-10-CM | POA: Insufficient documentation

## 2019-03-22 DIAGNOSIS — I10 Essential (primary) hypertension: Secondary | ICD-10-CM | POA: Insufficient documentation

## 2019-03-22 DIAGNOSIS — Z79899 Other long term (current) drug therapy: Secondary | ICD-10-CM | POA: Insufficient documentation

## 2019-03-22 DIAGNOSIS — Z87891 Personal history of nicotine dependence: Secondary | ICD-10-CM | POA: Insufficient documentation

## 2019-03-22 DIAGNOSIS — L02214 Cutaneous abscess of groin: Secondary | ICD-10-CM | POA: Insufficient documentation

## 2019-03-22 MED ORDER — HYDROCODONE-ACETAMINOPHEN 5-325 MG PO TABS
1.0000 | ORAL_TABLET | Freq: Once | ORAL | Status: AC
  Administered 2019-03-22: 1 via ORAL
  Filled 2019-03-22: qty 1

## 2019-03-22 MED ORDER — DOXYCYCLINE HYCLATE 100 MG PO CAPS: 100.0000 mg | ORAL_CAPSULE | Freq: Two times a day (BID) | ORAL | 0 refills | Status: DC

## 2019-03-22 MED ORDER — DOXYCYCLINE HYCLATE 100 MG PO TABS
100.0000 mg | ORAL_TABLET | Freq: Once | ORAL | Status: AC
  Administered 2019-03-22: 21:00:00 100 mg via ORAL
  Filled 2019-03-22: qty 1

## 2019-03-22 MED ORDER — IBUPROFEN 400 MG PO TABS
600.0000 mg | ORAL_TABLET | Freq: Once | ORAL | Status: AC
  Administered 2019-03-22: 600 mg via ORAL
  Filled 2019-03-22: qty 1

## 2019-03-22 MED ORDER — LIDOCAINE-EPINEPHRINE (PF) 2 %-1:200000 IJ SOLN
10.0000 mL | Freq: Once | INTRAMUSCULAR | Status: DC
  Filled 2019-03-22: qty 20

## 2019-03-22 NOTE — Discharge Instructions (Signed)
Your abscesses were drained today, these were left open so that they can heal from the inside out.  Please apply warm compresses or do warm soaks 2-3 times daily to help promote drainage and healing.  Take antibiotics twice daily with food as directed for the next week.  If you note increasing pain, swelling, redness, increasing drainage or fevers please return to the ED.  Please discuss these recurrent abscesses with your PCP

## 2019-03-22 NOTE — Telephone Encounter (Signed)
Called pt on 03/22/2019 at 11:13 AM and left HIPAA-compliant VM with instructions to call Family Medicine clinic back.  Have called pt multiple times and left VMs since last office visit with pharmacy staff in 12/2018. Pt has not returned calls. Plan to await pt to contact pharmacy staff at [facility] for further management of DM.   Thank you for involving pharmacy to assist in providing Ms.Placeres's care.   Drexel Iha, PharmD PGY2 Ambulatory Care Pharmacy Resident

## 2019-03-22 NOTE — ED Provider Notes (Signed)
[facility] EMERGENCY DEPARTMENT Provider Note   CSN: [id number] Arrival date & time: 03/22/19  1637     History   Chief Complaint Chief Complaint  Patient presents with  . Abscess    HPI [name] [name] is a 33 y.o. female.     [name] [name] is a 33 y.o. female with a history of diabetes, hypertension, and obesity, who presents to the ED for evaluation of 2 abscesses over her mons pubis.  She reports a history of many of these in the past that have required I&D.  These have been present for about 4 days they have become increasingly painful.  Pain is made worse with palpation as well as with walking or movement.  She has not noted any drainage she tried to pop them without success.  She is noted some surrounding redness.  Denies any vaginal discharge or bleeding.  No urinary symptoms.  Reports that she has been taking her medications and her diabetes has been under better control than previous.  Denies any associated fevers or chills, no nausea or vomiting.  No meds prior to arrival.     Past Medical History:  Diagnosis Date  . Abnormal Pap smear   . Diabetes mellitus    type II  . GBS (group B streptococcus) UTI complicating pregnancy   . Herpes simplex without mention of complication   . Hypertension   . Irregular menses   . Obesity     Patient Active Problem List   Diagnosis Date Noted  . Abdominal pain 03/14/2019  . Screening for malignant neoplasm of cervix 12/30/2018  . Type 2 diabetes mellitus without complication, without long-term current use of insulin ([place]) 12/30/2018  . Chest pain 05/20/2017  . History of tubal ligation   . Medication refill 07/22/2016  . Vaginal discharge 05/16/2016  . Constipation 03/04/2016  . Yeast infection 02/11/2016  . Alopecia areata 09/29/2013  . Type II diabetes mellitus, uncontrolled ([place]) 09/20/2007  . 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 Obesity 07/09/2006  . RHINITIS, ALLERGIC 07/09/2006   . PAPANICOLAOU SMEAR, ABNORMAL 07/09/2006    Past Surgical History:  Procedure Laterality Date  . CESAREAN SECTION  01/30/2011   Procedure: CESAREAN SECTION;  Surgeon: [name], MD;  Location: [facility];  Service: Gynecology;  Laterality: N/A;  . CESAREAN SECTION N/A 10/22/2014   Procedure: CESAREAN SECTION;  Surgeon: [name], MD;  Location: [facility];  Service: Obstetrics;  Laterality: N/A;  . TUBAL LIGATION    . WISDOM TOOTH EXTRACTION       OB History    Gravida  4   Para  2   Term  1   Preterm  1   AB  2   Living  2     SAB  2   TAB      Ectopic      Multiple  0   Live Births  2            Home Medications    Prior to Admission medications   Medication Sig Start Date End Date Taking? Authorizing Provider  acyclovir (ZOVIRAX) 400 MG tablet Take 1 tablet (400 mg total) by mouth 4 (four) times daily. 01/02/19   Margarita Mail, PA-C  doxycycline (VIBRAMYCIN) 100 MG capsule Take 1 capsule (100 mg total) by mouth 2 (two) times daily. One po bid x 7 days 03/22/19   Jacqlyn Larsen, PA-C  famotidine (PEPCID) 20 MG tablet Take  1 tablet (20 mg total) by mouth 2 (two) times daily. 03/10/19   Recardo Evangelist, PA-C  fluconazole (DIFLUCAN) 150 MG tablet Take 1 tablet (150 mg total) by mouth daily. Take the second tablet after 72 hours if symptoms persist. 12/28/18   Sherene Sires, DO  fluconazole (DIFLUCAN) 150 MG tablet Take one tablet every 72 hours for 3 doses 01/02/19   Harris, Abigail, PA-C  glucose blood (FREESTYLE TEST STRIPS) test strip Check glucose 1 time per day and write result in logbook 12/28/18   Sherene Sires, DO  glucose monitoring kit (FREESTYLE) monitoring kit 1 each by Does not apply route as needed for other. 12/28/18   Sherene Sires, DO  Insulin Glargine (LANTUS) 100 UNIT/ML Solostar Pen Inject 10-30 Units into the skin every morning. 01/06/19   Hensel, Jamal Collin, MD  ketoconazole (NIZORAL) 2 % cream Apply 1 application topically daily. 01/02/19    Margarita Mail, PA-C  Lancets (FREESTYLE) lancets Check glucose 1 time per day and write result in logbook 12/28/18   Sherene Sires, DO  metFORMIN (GLUCOPHAGE-XR) 500 MG 24 hr tablet Take 1 tablet (500 mg total) by mouth daily. 01/06/19   Zenia Resides, MD  ondansetron (ZOFRAN ODT) 4 MG disintegrating tablet Take 1 tablet (4 mg total) by mouth every 8 (eight) hours as needed for nausea or vomiting. 03/10/19   Recardo Evangelist, PA-C  Semaglutide,0.25 or 0.5MG/DOS, (OZEMPIC, 0.25 OR 0.5 MG/DOSE,) 2 MG/1.5ML SOPN Inject 0.25 mg into the skin once a week. 01/06/19   Zenia Resides, MD    Family History Family History  Problem Relation Age of Onset  . Diabetes Paternal Grandmother   . Hypertension Paternal Grandmother   . Diabetes Maternal Grandmother   . Diabetes Father   . Cancer Paternal Uncle     Social History Social History   Tobacco Use  . Smoking status: Former Research scientist (life sciences)  . Smokeless tobacco: Never Used  Substance Use Topics  . Alcohol use: Yes    Comment: "occassionally"  . Drug use: No     Allergies   Patient has no known allergies.   Review of Systems Review of Systems  Constitutional: Negative for chills and fever.  Gastrointestinal: Negative for nausea and vomiting.  Genitourinary: Negative for vaginal bleeding and vaginal discharge.  Skin: Positive for color change.       Abscess     Physical Exam Updated Vital Signs BP (!) 125/95 (BP Location: Right Arm)   Pulse 93   Temp 98.5 F (36.9 C) (Oral)   Resp 16   LMP 03/03/2019   SpO2 100%   Physical Exam Vitals signs and nursing note reviewed. Exam conducted with a chaperone present.  Constitutional:      General: She is not in acute distress.    Appearance: Normal appearance. She is well-developed. She is obese. She is not ill-appearing or diaphoretic.  HENT:     Head: Normocephalic and atraumatic.  Eyes:     General:        Right eye: No discharge.        Left eye: No discharge.  Pulmonary:      Effort: Pulmonary effort is normal. No respiratory distress.  Abdominal:     General: Abdomen is flat. Bowel sounds are normal. There is no distension.     Palpations: Abdomen is soft.     Tenderness: There is no abdominal tenderness. There is no guarding.  Genitourinary:    Comments: 2 areas of fluctuance with surrounding  erythema on the left side of the mons pubis, no producible drainage.  Does not extend into the labia. Skin:    General: Skin is warm and dry.  Neurological:     Mental Status: She is alert and oriented to person, place, and time.     Coordination: Coordination normal.  Psychiatric:        Mood and Affect: Mood normal.        Behavior: Behavior normal.      ED Treatments / Results  Labs (all labs ordered are listed, but only abnormal results are displayed) Labs Reviewed - No data to display  EKG None  Radiology No results found.  Procedures .Marland KitchenIncision and Drainage  Date/Time: 03/23/2019 10:27 PM Performed by: Jacqlyn Larsen, PA-C Authorized by: Jacqlyn Larsen, PA-C   Consent:    Consent obtained:  Verbal   Consent given by:  Patient   Risks discussed:  Bleeding, incomplete drainage, pain, damage to other organs and infection   Alternatives discussed:  No treatment Location:    Type:  Abscess   Size:  2 small adjacent abscesses both about 2 x 2 cm   Location:  Anogenital   Anogenital location: Mons pubis. Pre-procedure details:    Skin preparation:  Chloraprep Anesthesia (see MAR for exact dosages):    Anesthesia method:  Local infiltration   Local anesthetic:  Lidocaine 2% WITH epi Procedure type:    Complexity:  Simple Procedure details:    Incision types:  Single straight   Incision depth:  Dermal   Scalpel blade:  11   Wound management:  Probed and deloculated and irrigated with saline   Drainage:  Bloody and purulent   Drainage amount:  Moderate   Wound treatment:  Wound left open   Packing materials:  None Post-procedure  details:    Patient tolerance of procedure:  Tolerated well, no immediate complications Ultrasound ED Soft Tissue  Date/Time: 03/23/2019 10:30 PM Performed by: Jacqlyn Larsen, PA-C Authorized by: Jacqlyn Larsen, PA-C   Procedure details:    Indications: localization of abscess     Transverse view:  Visualized   Longitudinal view:  Visualized   Images: archived     Limitations:  Body habitus Location:    Location: groin     Side:  Left Findings:     abscess present   (including critical care time)  Medications Ordered in ED Medications  ibuprofen (ADVIL) tablet 600 mg (600 mg Oral Given 03/22/19 2005)  HYDROcodone-acetaminophen (NORCO/VICODIN) 5-325 MG per tablet 1 tablet (1 tablet Oral Given 03/22/19 2006)  doxycycline (VIBRA-TABS) tablet 100 mg (100 mg Oral Given 03/22/19 2105)     Initial Impression / Assessment and Plan / ED Course  I have reviewed the triage vital signs and the nursing notes.  Pertinent labs & imaging results that were available during my care of the patient were reviewed by me and considered in my medical decision making (see chart for details).  33 year old female with history of recurrent abscesses presents with 2 abscesses on the left side of the mons pubis there is a small amount of surrounding cellulitis, fluid collections confirmed with ultrasound.  The areas were anesthetized and incision and drainage performed with a moderate amount of bloody purulent fluid, the areas were deloculated and irrigated with normal saline.  Patient tolerated procedure well.  Given location and recurrence will place patient on doxycycline.  I have asked her to follow-up with her PCP regarding these recurrent abscesses.  Return  precautions discussed.  Patient expresses understanding and agreement with plan.  Discharged home in good condition.  Final Clinical Impressions(s) / ED Diagnoses   Final diagnoses:  Groin abscess    ED Discharge Orders         Ordered     doxycycline (VIBRAMYCIN) 100 MG capsule  2 times daily     03/22/19 2100           Jacqlyn Larsen, PA-C 03/23/19 [place], [place], DO 03/23/19 2253

## 2019-03-22 NOTE — ED Triage Notes (Signed)
Pt here from home with c/o two abscesses above her vagina pt has the same thing 1 month ago and had it drained

## 2019-03-23 ENCOUNTER — Ambulatory Visit: Payer: Self-pay | Admitting: Family Medicine

## 2019-04-05 ENCOUNTER — Telehealth: Payer: Self-pay | Admitting: Pharmacist

## 2019-04-05 NOTE — Telephone Encounter (Signed)
Call from patient requesting assistance with medication samples.   She reports taking on 5-10 units of Lantus in addition to the [place] which she reports tolerating well recently.   Blood sugars reported 110: 115  Medication Samples have been provided to the patient.  Drug name: Lantus        Strength: 100units/ml        Qty: 1 pen  LOT: [id number]  Exp.Date: 02/08/2021  Dosing instructions: 5-10 units daily  The patient has been instructed regarding the correct time, dose, and frequency of taking this medication, including desired effects and most common side effects.  Medication Samples have been provided to the patient.  Drug name: Ozempic            Qty: 2 pens  LOT: [id number], [id number]  Exp.Date:  03/11/2021  Dosing instructions: 0.25mg  PLUS three clicks starting on Thursday December 3rd.   The patient has been instructed regarding the correct time, dose, and frequency of taking this medication, including desired effects and most common side effects.   Janeann Forehand 9:32 AM 04/05/2019  Follow-up to evaluate GI tolerability AND glycemic control in 3-4 weeks.

## 2019-04-15 ENCOUNTER — Other Ambulatory Visit: Payer: Self-pay

## 2019-04-15 ENCOUNTER — Other Ambulatory Visit: Payer: Self-pay | Admitting: Family Medicine

## 2019-04-15 ENCOUNTER — Encounter: Payer: Self-pay | Admitting: Family Medicine

## 2019-04-15 DIAGNOSIS — D649 Anemia, unspecified: Secondary | ICD-10-CM

## 2019-04-15 NOTE — Addendum Note (Signed)
Addended by: [place] Pink on: 04/15/2019 03:28 PM   Modules accepted: Orders

## 2019-04-15 NOTE — Progress Notes (Signed)
Subjective  [name] [name] is a 33 y.o. female is presenting with the following   Chief Complaint noted Review of Symptoms - see HPI PMH - Smoking status noted.    Objective Vital Signs reviewed There were no vitals taken for this visit.  Assessments/Plans  No problem-specific Assessment & Plan notes found for this encounter.   See after visit summary for details of patient instructions

## 2019-04-15 NOTE — Addendum Note (Signed)
Addended by: [place] Pink on: 04/15/2019 03:55 PM   Modules accepted: Orders

## 2019-04-16 LAB — CBC
Hematocrit: 34.9 % (ref 34.0–46.6)
Hemoglobin: 10.6 g/dL — ABNORMAL LOW (ref 11.1–15.9)
MCH: 23.8 pg — ABNORMAL LOW (ref 26.6–33.0)
MCHC: 30.4 g/dL — ABNORMAL LOW (ref 31.5–35.7)
MCV: 78 fL — ABNORMAL LOW (ref 79–97)
Platelets: 369 10*3/uL (ref 150–450)
RBC: 4.45 x10E6/uL (ref 3.77–5.28)
RDW: 16.1 % — ABNORMAL HIGH (ref 11.7–15.4)
WBC: 7.2 10*3/uL (ref 3.4–10.8)

## 2019-04-28 ENCOUNTER — Telehealth: Payer: Self-pay | Admitting: Pharmacist

## 2019-04-28 NOTE — Telephone Encounter (Signed)
Contacted patient RE blood sugar control.    She reports excellent readings (low 100s) with the exception of when she missed (late) with her weekly shot of Ozempic.   Dose of Ozempic continues at 0.25mg  MINUS 3 clicks and she is tolerating this dose very well.  No change in treatment plan today.    Follow-up with PCP in early 2021 (2-3 months from now) encouraged.

## 2019-04-28 NOTE — Telephone Encounter (Signed)
-----   Message from Leavy Cella, [facility] sent at 04/05/2019  9:34 AM EST ----- Regarding: Diabetes - Ozempic

## 2019-05-09 NOTE — Telephone Encounter (Signed)
Noted and agree. 

## 2019-05-23 ENCOUNTER — Telehealth: Payer: Self-pay

## 2019-05-23 NOTE — Telephone Encounter (Signed)
Pt calls nurse line requesting refill of Lantus. Patient states that she is unable to afford insulin through pharmacy and that we have been giving her samples.  Please advise,   Delta Deshmukh C Britney Captain, RN  

## 2019-05-23 NOTE — Telephone Encounter (Signed)
Please assist patient in making an appointment to see provider or, probably more helpful with getting samples or prescription coverage help, Dr. Raymondo Band about this. Can be a virtual appointment.  Thank you.

## 2019-05-24 NOTE — Telephone Encounter (Signed)
Scheduled patient office visit with Dr. Raymondo Band to discuss medication management.   Veronda Prude, RN

## 2019-05-26 ENCOUNTER — Encounter: Payer: Self-pay | Admitting: Pharmacist

## 2019-05-26 ENCOUNTER — Ambulatory Visit (INDEPENDENT_AMBULATORY_CARE_PROVIDER_SITE_OTHER): Payer: Self-pay | Admitting: Pharmacist

## 2019-05-26 ENCOUNTER — Other Ambulatory Visit: Payer: Self-pay

## 2019-05-26 DIAGNOSIS — E119 Type 2 diabetes mellitus without complications: Secondary | ICD-10-CM

## 2019-05-26 LAB — POCT GLYCOSYLATED HEMOGLOBIN (HGB A1C): HbA1c, POC (controlled diabetic range): 11.3 % — AB (ref 0.0–7.0)

## 2019-05-26 MED ORDER — OZEMPIC (0.25 OR 0.5 MG/DOSE) 2 MG/1.5ML ~~LOC~~ SOPN: 0.2500 mg | PEN_INJECTOR | SUBCUTANEOUS | 3 refills | Status: DC

## 2019-05-26 MED ORDER — TRESIBA FLEXTOUCH 100 UNIT/ML ~~LOC~~ SOPN: 10.0000 [IU] | PEN_INJECTOR | Freq: Every day | SUBCUTANEOUS | 0 refills | Status: DC

## 2019-05-26 MED ORDER — TRESIBA FLEXTOUCH 100 UNIT/ML ~~LOC~~ SOPN: 10.0000 [IU] | PEN_INJECTOR | Freq: Every day | SUBCUTANEOUS | 11 refills | Status: DC

## 2019-05-26 MED ORDER — OZEMPIC (0.25 OR 0.5 MG/DOSE) 2 MG/1.5ML ~~LOC~~ SOPN: 0.2500 mg | PEN_INJECTOR | SUBCUTANEOUS | 0 refills | Status: DC

## 2019-05-26 NOTE — Progress Notes (Signed)
Reviewed: I agree with the documentation and management of Dr. Koval. 

## 2019-05-26 NOTE — Assessment & Plan Note (Signed)
>>  ASSESSMENT AND PLAN FOR TYPE 2 DIABETES MELLITUS WITHOUT COMPLICATION, WITHOUT LONG-TERM CURRENT USE OF INSULIN  (H[facility]) WRITTEN ON 05/26/2019 11:19 AM BY KOVAL, PETER G, RPH-CPP  Diabetes longstanding since 2007 currently uncontrolled. Patient is not currently experiencing hypoglycemia. She is able to verbalize appropriate hypoglycemia management plan. Patient is adherent with medication. Control is suboptimal due to likely elevated postprandial BG readings from carbohydrate intake at meals. -CHANGED basal insulin  Lantus  10 units daily to basal insulin  Tresiba  (insulin  degludec) 10 units daily due to sample supply -Continued GLP-1 Ozempic  (generic name semaglutide ) 0.25 mg weekly MINUS 2 clicks (Mondays) -Extensively discussed pathophysiology of diabetes, recommended lifestyle interventions, dietary effects on blood sugar control -Counseled on s/sx of and management of hypoglycemia.  Medication supply attempt to get through [place] [facility] program.  -Contact patient in 1 month (preferably in the mornings) via telephone -Next A1C anticipated 3-6 months

## 2019-05-26 NOTE — Assessment & Plan Note (Signed)
Diabetes longstanding since 2007 currently uncontrolled. Patient is not currently experiencing hypoglycemia. She is able to verbalize appropriate hypoglycemia management plan. Patient is adherent with medication. Control is suboptimal due to likely elevated postprandial BG readings from carbohydrate intake at meals. -CHANGED basal insulin Lantus 10 units daily to basal insulin Tresiba (insulin degludec) 10 units daily due to sample supply -Continued GLP-1 Ozempic (generic name semaglutide) 0.25 mg weekly MINUS 2 clicks (Mondays) -Extensively discussed pathophysiology of diabetes, recommended lifestyle interventions, dietary effects on blood sugar control -Counseled on s/sx of and management of hypoglycemia.  Medication supply attempt to get through [place] [facility] program.  -Contact patient in 1 month (preferably in the mornings) via telephone -Next A1C anticipated 3-6 months

## 2019-05-26 NOTE — Patient Instructions (Addendum)
Great to see you today!  Congratulations on the weight loss!    Your blood sugar control is much improved.   Keep up the great work.   We will plan to contact you in 1 month via phone call to reassess your control and progress.   Please schedule a phone/virtual visit with Dr. Dareen Piano in 2 months.

## 2019-05-26 NOTE — Progress Notes (Signed)
S:     Chief Complaint  Patient presents with  . Medication Management    Diabetes    Patient arrives in good spirits ambulating without assistance  Presents for diabetes evaluation, education, and management. Patient was referred and last seen by Primary Care Provider, Dr. Criss Rosales on 12/28/18. Her current PCP is Dr. Doristine Mango.  Patient reports Diabetes was diagnosed in ~2007 (pt was 34 yrs old).   Insurance coverage/medication affordability: no Science writer  -Pt is a part-time Teacher, early years/pre. Currently, she is offered 1 hour per week. Pt is married; husband receives ~$760/month for disability.  She was denied Medicaid.  Patient adherent with medications. Current diabetes medications include: Lantus 10 units daily, Ozempic 2.77 mg MINUS 2 clicks (injects on Mondays) Current hypertension medications include: none Current hyperlipidemia medications include: none  Patient denies hypoglycemic events.  Patient reported dietary habits: tries to eat baked foods; uses an air fryer. She reports eating breakfast and lunch. She often isn't hungry for dinner.  She has been drinking water and cranberry juice (reports more water than cranberry juice)  Patient-reported exercise habits: walks every evening with children    Patient reports nocturia (nighttime urination) once nightly  Patient denies neuropathy (nerve pain). Patient denies visual changes. Patient reports self foot exams.     O:  Physical Exam Constitutional:      Appearance: Normal appearance. She is obese.  Cardiovascular:     Rate and Rhythm: Normal rate and regular rhythm.  Psychiatric:        Mood and Affect: Mood normal.        Behavior: Behavior normal.        Thought Content: Thought content normal.        Judgment: Judgment normal.    Review of Systems  Genitourinary: Positive for frequency.       Important to note - genital infection has resolved     Lab Results  Component Value Date   HGBA1C 13.3 (A) 11/05/2018   Vitals:   05/26/19 0919  BP: 116/82  Pulse: 84  SpO2: 98%    Lipid Panel     Component Value Date/Time   CHOL 142 05/20/2017 0915   TRIG 71 05/20/2017 0915   HDL 42 05/20/2017 0915   CHOLHDL 3.4 05/20/2017 0915   CHOLHDL 3.4 08/02/2013 1518   VLDL 20 08/02/2013 1518   LDLCALC 86 05/20/2017 0915   LDLDIRECT 98 07/01/2012 0954    Home fasting blood sugars: "low 100s" (reports 105 this AM, 150 last week in afternoon 2 hours after lunch (also forgot Lantus dose that AM) -She states she is checking her BG in the AM and afternoon - states her BG are always in the "low 100s"    A/P: Diabetes longstanding since 2007 currently uncontrolled. Patient is not currently experiencing hypoglycemia. She is able to verbalize appropriate hypoglycemia management plan. Patient is adherent with medication. Control is suboptimal due to likely elevated postprandial BG readings from carbohydrate intake at meals. -CHANGED basal insulin Lantus 10 units daily to basal insulin Tresiba (insulin degludec) 10 units daily due to sample supply -Continued GLP-1 Ozempic (generic name semaglutide) 0.25 mg weekly MINUS 2 clicks (Mondays) -Extensively discussed pathophysiology of diabetes, recommended lifestyle interventions, dietary effects on blood sugar control -Counseled on s/sx of and management of hypoglycemia.  Medication supply attempt to get through [place] [facility] program.  -Contact patient in 1 month (preferably in the mornings) via telephone -Next A1C anticipated 3-6 months  ASCVD risk -  primary prevention in patient with diabetes. Last LDL is controlled. Unable to calculate ASCVD risk score. Initiation of moderate intensity statin could be considered, however, considering patient is young/relatively healthy and LDL is at goal will hold off at this time. Aspirin is not indicated.   Written patient instructions provided.  Total time in face to face counseling 30 minutes.     Follow up Pharmacist in 1 month via telephone and follow up with PCP in 2-3 months.   Patient seen with Dr. Raymondo Band and Zachery Conch, PharmD, PGY2 Ambulatory Care pharmacy resident.

## 2019-06-30 ENCOUNTER — Ambulatory Visit: Payer: Self-pay | Admitting: Pharmacist

## 2019-06-30 ENCOUNTER — Telehealth: Payer: Self-pay | Admitting: Pharmacist

## 2019-06-30 DIAGNOSIS — E119 Type 2 diabetes mellitus without complications: Secondary | ICD-10-CM

## 2019-06-30 NOTE — Telephone Encounter (Signed)
Noted and agree. 

## 2019-06-30 NOTE — Assessment & Plan Note (Signed)
>>  ASSESSMENT AND PLAN FOR TYPE 2 DIABETES MELLITUS WITHOUT COMPLICATION, WITHOUT LONG-TERM CURRENT USE OF INSULIN  ([facility]CC) WRITTEN ON 06/30/2019 10:49 AM BY KOVAL, Arthur Billing, RPH-CPP   Patient reports doing well.  Denies hypoglycemia, or medication side effects.   Blood sugar readings (fasting) reported as low 100s  (108-110) most days with a single exception when she missed her insulin  dose the previous day.  She was encouraged to increase the Ozempic  by 1 click (she has been taking 0.18minus 2 clicks) to assess potential tolerability.  She notes decreased satiety and some feeling of weight loss (has not weighed her self).

## 2019-06-30 NOTE — Assessment & Plan Note (Signed)
  Patient reports doing well.  Denies hypoglycemia, or medication side effects.   Blood sugar readings (fasting) reported as low 100s  (108-110) most days with a single exception when she missed her insulin dose the previous day.  She was encouraged to increase the Ozempic by 1 click (she has been taking 0.77minus 2 clicks) to assess potential tolerability.  She notes decreased satiety and some feeling of weight loss (has not weighed her self).

## 2019-06-30 NOTE — Telephone Encounter (Signed)
Contacted by phone as alternative to planned clinic visit which was cancelled by weather.   Patient reports doing well.  Denies hypoglycemia, or medication side effects.   Blood sugar readings (fasting) reported as low 100s  (108-110) most days with a single exception when she missed her insulin dose the previous day.  She was encouraged to increase the Ozempic by 1 click (she has been taking 0.47minus 2 clicks) to assess potential tolerability.  She notes decreased satiety and some feeling of weight loss (has not weighed her self).   Encouraged continued plan for gradual weight loss and increasing exercise as Spring weather permits. No change in medication therapy at this time.  Next follow-up with PCP, Dr. Dareen Piano in 2-3 months.

## 2019-07-19 ENCOUNTER — Ambulatory Visit: Payer: Self-pay | Admitting: Family Medicine

## 2019-08-26 ENCOUNTER — Encounter: Payer: Self-pay | Admitting: Family Medicine

## 2019-09-16 ENCOUNTER — Ambulatory Visit: Payer: Self-pay

## 2019-10-17 ENCOUNTER — Emergency Department (HOSPITAL_COMMUNITY)
Admission: EM | Admit: 2019-10-17 | Discharge: 2019-10-17 | Disposition: A | Discharge status: Home / Self Care | Payer: Medicaid Other | Attending: Emergency Medicine | Admitting: Emergency Medicine

## 2019-10-17 ENCOUNTER — Encounter (HOSPITAL_COMMUNITY): Payer: Self-pay | Admitting: Emergency Medicine

## 2019-10-17 ENCOUNTER — Other Ambulatory Visit: Payer: Self-pay

## 2019-10-17 DIAGNOSIS — Z79899 Other long term (current) drug therapy: Secondary | ICD-10-CM | POA: Insufficient documentation

## 2019-10-17 DIAGNOSIS — E119 Type 2 diabetes mellitus without complications: Secondary | ICD-10-CM | POA: Diagnosis not present

## 2019-10-17 DIAGNOSIS — R200 Anesthesia of skin: Secondary | ICD-10-CM | POA: Diagnosis not present

## 2019-10-17 DIAGNOSIS — R0789 Other chest pain: Secondary | ICD-10-CM | POA: Diagnosis not present

## 2019-10-17 DIAGNOSIS — Z794 Long term (current) use of insulin: Secondary | ICD-10-CM | POA: Insufficient documentation

## 2019-10-17 DIAGNOSIS — I10 Essential (primary) hypertension: Secondary | ICD-10-CM | POA: Insufficient documentation

## 2019-10-17 MED ORDER — NAPROXEN 500 MG PO TABS: 500.0000 mg | ORAL_TABLET | Freq: Two times a day (BID) | ORAL | 0 refills | Status: DC

## 2019-10-17 NOTE — Discharge Instructions (Signed)
Begin taking naproxen as prescribed.  Follow-up with your primary doctor if symptoms or not improving in the next week, and return to the ER if you develop worsening weakness/numbness, or other new and concerning symptoms.

## 2019-10-17 NOTE — ED Notes (Signed)
Pt verbalizes understanding of discharge instructions, ambulatory out of ED in NAD
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
## 2019-10-30 ENCOUNTER — Other Ambulatory Visit: Payer: Self-pay

## 2019-10-30 DIAGNOSIS — M79602 Pain in left arm: Secondary | ICD-10-CM | POA: Insufficient documentation

## 2019-10-30 DIAGNOSIS — Z5321 Procedure and treatment not carried out due to patient leaving prior to being seen by health care provider: Secondary | ICD-10-CM | POA: Insufficient documentation

## 2019-10-31 ENCOUNTER — Encounter: Payer: Self-pay | Admitting: Student in an Organized Health Care Education/Training Program

## 2019-10-31 ENCOUNTER — Emergency Department (HOSPITAL_COMMUNITY)
Admission: EM | Admit: 2019-10-31 | Discharge: 2019-10-31 | Disposition: A | Discharge status: Home / Self Care | Payer: Medicaid Other | Attending: Emergency Medicine | Admitting: Emergency Medicine

## 2019-10-31 ENCOUNTER — Emergency Department (HOSPITAL_COMMUNITY): Payer: Medicaid Other

## 2019-10-31 ENCOUNTER — Other Ambulatory Visit: Payer: Self-pay

## 2019-10-31 ENCOUNTER — Encounter (HOSPITAL_COMMUNITY): Payer: Self-pay | Admitting: Emergency Medicine

## 2019-10-31 NOTE — ED Triage Notes (Signed)
Patient reports left elbow pain worse when extending onset 2 days ago .  , denies injury .

## 2019-10-31 NOTE — ED Notes (Signed)
Pt LWBS. Pt stated she needed to take her husband to work and would be back tomorrow.

## 2019-12-09 ENCOUNTER — Ambulatory Visit: Payer: Self-pay | Admitting: Student in an Organized Health Care Education/Training Program

## 2019-12-23 ENCOUNTER — Other Ambulatory Visit: Payer: Self-pay

## 2019-12-23 ENCOUNTER — Emergency Department (HOSPITAL_COMMUNITY)
Admission: EM | Admit: 2019-12-23 | Discharge: 2019-12-23 | Disposition: A | Discharge status: Home / Self Care | Payer: HRSA Program | Attending: Emergency Medicine | Admitting: Emergency Medicine

## 2019-12-23 ENCOUNTER — Emergency Department (HOSPITAL_COMMUNITY): Payer: HRSA Program

## 2019-12-23 ENCOUNTER — Encounter (HOSPITAL_COMMUNITY): Payer: Self-pay | Admitting: *Deleted

## 2019-12-23 DIAGNOSIS — R067 Sneezing: Secondary | ICD-10-CM | POA: Diagnosis not present

## 2019-12-23 DIAGNOSIS — Z87891 Personal history of nicotine dependence: Secondary | ICD-10-CM | POA: Insufficient documentation

## 2019-12-23 DIAGNOSIS — Z79899 Other long term (current) drug therapy: Secondary | ICD-10-CM | POA: Insufficient documentation

## 2019-12-23 DIAGNOSIS — E119 Type 2 diabetes mellitus without complications: Secondary | ICD-10-CM | POA: Diagnosis not present

## 2019-12-23 DIAGNOSIS — R05 Cough: Secondary | ICD-10-CM | POA: Diagnosis present

## 2019-12-23 DIAGNOSIS — M791 Myalgia, unspecified site: Secondary | ICD-10-CM | POA: Diagnosis not present

## 2019-12-23 DIAGNOSIS — R0981 Nasal congestion: Secondary | ICD-10-CM | POA: Insufficient documentation

## 2019-12-23 DIAGNOSIS — Z794 Long term (current) use of insulin: Secondary | ICD-10-CM | POA: Insufficient documentation

## 2019-12-23 DIAGNOSIS — I10 Essential (primary) hypertension: Secondary | ICD-10-CM | POA: Insufficient documentation

## 2019-12-23 DIAGNOSIS — U071 COVID-19: Secondary | ICD-10-CM | POA: Diagnosis not present

## 2019-12-23 LAB — SARS CORONAVIRUS 2 BY RT PCR (HOSPITAL ORDER, PERFORMED IN ~~LOC~~ HOSPITAL LAB): SARS Coronavirus 2: POSITIVE — AB

## 2019-12-23 NOTE — ED Notes (Addendum)
Date and time results received: 12/23/19 1:01 PM  Test: Covid Critical Value: positive  Name of Provider Notified: Greta Doom PA  Orders Received? Or Actions Taken?: Actions Taken: PA made aware

## 2019-12-23 NOTE — ED Triage Notes (Signed)
Pt complains of cough, headache, loss of smell, body aches, for the past week. Pt is not vaccinated for COVID. Pt has been taking theraflu w/o relief.

## 2019-12-23 NOTE — Discharge Instructions (Addendum)
You have tested positive for COVID-19 infection.  Please follow instruction below.  Call reach out to the post Covid clinic for close follow-up.  Return if you have any concern.

## 2019-12-23 NOTE — ED Provider Notes (Signed)
[name] Provider [facility]   CSN: [id number] Arrival date & time: 12/23/19  1120     History Chief Complaint  Patient presents with  . Cough  . Generalized Body Aches    [name] is a 34 y.o. female.  The history is provided by the patient. No language interpreter was used.  Cough    34 year old female hx DM sent here from work for evaluation of covid sxs.  Pt report for the past 5 days she has had allergy sxs including sneezing, congestion, runny nose, dry cough.  No fever, cp, sob, pleuritic cp. Did report loss of smell.  Has tried theraflu and ibuprofen without releif.  Have not been vaccinated. No sick contact.  No sinus pain.      Past Medical History:  Diagnosis Date  . Abnormal Pap smear   . Diabetes mellitus    type II  . GBS (group B streptococcus) UTI complicating pregnancy   . Herpes simplex without mention of complication   . Hypertension   . Irregular menses   . Obesity     Patient Active Problem List   Diagnosis Date Noted  . Anemia 04/15/2019  . Abdominal pain 03/14/2019  . Screening for malignant neoplasm of cervix 12/30/2018  . Type 2 diabetes mellitus without complication, without long-term current use of insulin ([place]) 12/30/2018  . Chest pain 05/20/2017  . History of tubal ligation   . Medication refill 07/22/2016  . Vaginal discharge 05/16/2016  . Constipation 03/04/2016  . Yeast infection 02/11/2016  . Alopecia areata 09/29/2013  . Type II diabetes mellitus, uncontrolled ([place]) 09/20/2007  . 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 Obesity 07/09/2006  . RHINITIS, ALLERGIC 07/09/2006  . PAPANICOLAOU SMEAR, ABNORMAL 07/09/2006    Past Surgical History:  Procedure Laterality Date  . CESAREAN SECTION  01/30/2011   Procedure: CESAREAN SECTION;  Surgeon: [name], MD;  Location: [facility];  Service: Gynecology;  Laterality: N/A;  . CESAREAN SECTION N/A 10/22/2014   Procedure: CESAREAN  SECTION;  Surgeon: [name], MD;  Location: [facility];  Service: Obstetrics;  Laterality: N/A;  . TUBAL LIGATION    . WISDOM TOOTH EXTRACTION       OB History    Gravida  4   Para  2   Term  1   Preterm  1   AB  2   Living  2     SAB  2   TAB      Ectopic      Multiple  0   Live Births  2           Family History  Problem Relation Age of Onset  . Diabetes Paternal Grandmother   . Hypertension Paternal Grandmother   . Diabetes Maternal Grandmother   . Diabetes Father   . Cancer Paternal Uncle     Social History   Tobacco Use  . Smoking status: Former Research scientist (life sciences)  . Smokeless tobacco: Never Used  Vaping Use  . Vaping Use: Never used  Substance Use Topics  . Alcohol use: Yes    Comment: "occassionally"  . Drug use: No    Home Medications Prior to Admission medications   Medication Sig Start Date End Date Taking? Authorizing Provider  acyclovir (ZOVIRAX) 400 MG tablet Take 1 tablet (400 mg total) by mouth 4 (four) times daily. 01/02/19   Margarita Mail, PA-C  famotidine (PEPCID) 20 MG tablet Take 1 tablet (20  mg total) by mouth 2 (two) times daily. 03/10/19   Recardo Evangelist, PA-C  glucose blood (FREESTYLE TEST STRIPS) test strip Check glucose 1 time per day and write result in logbook 12/28/18   Sherene Sires, DO  glucose monitoring kit (FREESTYLE) monitoring kit 1 each by Does not apply route as needed for other. 12/28/18   Sherene Sires, DO  insulin degludec (TRESIBA FLEXTOUCH) 100 UNIT/ML SOPN FlexTouch Pen Inject 0.1 mLs (10 Units total) into the skin daily. 05/26/19   Zenia Resides, MD  Insulin Glargine (LANTUS) 100 UNIT/ML Solostar Pen Inject 10-30 Units into the skin every morning. 01/06/19   Zenia Resides, MD  Lancets (FREESTYLE) lancets Check glucose 1 time per day and write result in logbook 12/28/18   Sherene Sires, DO  naproxen (NAPROSYN) 500 MG tablet Take 1 tablet (500 mg total) by mouth 2 (two) times daily. 10/17/19   Veryl Speak, MD    ondansetron (ZOFRAN ODT) 4 MG disintegrating tablet Take 1 tablet (4 mg total) by mouth every 8 (eight) hours as needed for nausea or vomiting. Patient not taking: Reported on 05/26/2019 03/10/19   Recardo Evangelist, PA-C  Semaglutide,0.25 or 0.'5MG'$ /DOS, (OZEMPIC, 0.25 OR 0.5 MG/DOSE,) 2 MG/1.5ML SOPN Inject 0.25 mg into the skin once a week. 05/26/19   Zenia Resides, MD    Allergies    Metformin and related  Review of Systems   Review of Systems  Respiratory: Positive for cough.   All other systems reviewed and are negative.   Physical Exam Updated Vital Signs BP (!) 152/96 (BP Location: Left Arm)   Pulse 97   Temp 98.7 F (37.1 C) (Oral)   Resp 18   LMP 12/16/2019   SpO2 100%   Physical Exam Vitals and nursing note reviewed.  Constitutional:      General: She is not in acute distress.    Appearance: She is well-developed. She is obese.  HENT:     Head: Atraumatic.     Right Ear: Tympanic membrane normal.     Left Ear: Tympanic membrane normal.     Nose: Nose normal.     Mouth/Throat:     Mouth: Mucous membranes are moist.  Eyes:     Conjunctiva/sclera: Conjunctivae normal.  Cardiovascular:     Rate and Rhythm: Normal rate and regular rhythm.     Heart sounds: Normal heart sounds.  Pulmonary:     Effort: Pulmonary effort is normal.     Breath sounds: Normal breath sounds. No wheezing, rhonchi or rales.  Abdominal:     Palpations: Abdomen is soft.  Musculoskeletal:     Cervical back: Neck supple. No tenderness.  Skin:    Findings: No rash.  Neurological:     Mental Status: She is alert. Mental status is at baseline.  Psychiatric:        Mood and Affect: Mood normal.     ED Results / Procedures / Treatments   Labs (all labs ordered are listed, but only abnormal results are displayed) Labs Reviewed  SARS CORONAVIRUS 2 BY RT PCR (HOSPITAL ORDER, [facility]) - Abnormal; Notable for the following components:      Result Value    SARS Coronavirus 2 POSITIVE (*)    All other components within normal limits    EKG None  Radiology DG Chest Port 1 View  Result Date: 12/23/2019 CLINICAL DATA:  Cough EXAM: PORTABLE CHEST 1 VIEW COMPARISON:  October 25, 2014 FINDINGS: The  cardiomediastinal silhouette is normal in contour when accounting for technique. No pleural effusion. No pneumothorax. No acute pleuroparenchymal abnormality. Visualized abdomen is unremarkable. No acute osseous abnormality noted. IMPRESSION: No acute cardiopulmonary abnormality. Electronically Signed   By: Valentino Saxon MD   On: 12/23/2019 12:04    Procedures Procedures (including critical care time)  Medications Ordered in ED Medications - No data to display  ED Course  I have reviewed the triage vital signs and the nursing notes.  Pertinent labs & imaging results that were available during my care of the patient were reviewed by me and considered in my medical decision making (see chart for details).    MDM Rules/Calculators/A&P                          BP (!) 152/96 (BP Location: Left Arm)   Pulse 97   Temp 98.7 F (37.1 C) (Oral)   Resp 18   LMP 12/16/2019   SpO2 100%   Final Clinical Impression(s) / ED Diagnoses Final diagnoses:  COVID-19 virus infection    Rx / DC Orders ED Discharge Orders    None     12:50 PM Pt with cold and allergy sxs in the setting of COVID pandemic.  CXR unremarkable.  covid-19 screening test.    1:19 PM Covid+, recommend quarantine, monitor for sxs at home.  Work note provided.  Return precaution given.  Kanyon D Pinera was evaluated in Emergency Department on 12/23/2019 for the symptoms described in the history of present illness. She was evaluated in the context of the global COVID-19 pandemic, which necessitated consideration that the patient might be at risk for infection with the SARS-CoV-2 virus that causes COVID-19. Institutional protocols and algorithms that pertain to the evaluation of  patients at risk for COVID-19 are in a state of rapid change based on information released by regulatory bodies including the CDC and federal and state organizations. These policies and algorithms were followed during the patient's care in the ED.    Domenic Moras, PA-C 12/23/19 [place], MD 01/11/20 [phone]

## 2019-12-24 ENCOUNTER — Telehealth: Payer: Self-pay | Admitting: Adult Health

## 2019-12-24 NOTE — Telephone Encounter (Signed)
Called and LMOM regarding monoclonal antibody treatment for COVID 19 given to those who are at risk for complications and/or hospitalization of the virus.  Patient meets criteria based on: BMI greater than 25, diabetes  Call back number given: [phone]  My chart message: sent  Lillard Anes, NP

## 2019-12-25 ENCOUNTER — Other Ambulatory Visit: Payer: Self-pay

## 2019-12-25 ENCOUNTER — Emergency Department (HOSPITAL_COMMUNITY)
Admission: EM | Admit: 2019-12-25 | Discharge: 2019-12-25 | Disposition: A | Discharge status: Home / Self Care | Payer: HRSA Program | Attending: Emergency Medicine | Admitting: Emergency Medicine

## 2019-12-25 ENCOUNTER — Encounter (HOSPITAL_COMMUNITY): Payer: Self-pay | Admitting: Emergency Medicine

## 2019-12-25 DIAGNOSIS — U071 COVID-19: Secondary | ICD-10-CM | POA: Diagnosis not present

## 2019-12-25 DIAGNOSIS — R519 Headache, unspecified: Secondary | ICD-10-CM | POA: Diagnosis present

## 2019-12-25 DIAGNOSIS — Z794 Long term (current) use of insulin: Secondary | ICD-10-CM | POA: Diagnosis not present

## 2019-12-25 DIAGNOSIS — R112 Nausea with vomiting, unspecified: Secondary | ICD-10-CM

## 2019-12-25 DIAGNOSIS — Z87891 Personal history of nicotine dependence: Secondary | ICD-10-CM | POA: Diagnosis not present

## 2019-12-25 DIAGNOSIS — I10 Essential (primary) hypertension: Secondary | ICD-10-CM | POA: Insufficient documentation

## 2019-12-25 DIAGNOSIS — E119 Type 2 diabetes mellitus without complications: Secondary | ICD-10-CM | POA: Insufficient documentation

## 2019-12-25 LAB — COMPREHENSIVE METABOLIC PANEL
ALT: 20 U/L (ref 0–44)
AST: 21 U/L (ref 15–41)
Albumin: 3.7 g/dL (ref 3.5–5.0)
Alkaline Phosphatase: 92 U/L (ref 38–126)
Anion gap: 12 (ref 5–15)
BUN: 8 mg/dL (ref 6–20)
CO2: 19 mmol/L — ABNORMAL LOW (ref 22–32)
Calcium: 9.2 mg/dL (ref 8.9–10.3)
Chloride: 101 mmol/L (ref 98–111)
Creatinine, Ser: 0.72 mg/dL (ref 0.44–1.00)
GFR calc Af Amer: >60 mL/min (ref >60)
GFR calc non Af Amer: >60 mL/min (ref >60)
Glucose, Bld: 204 mg/dL — ABNORMAL HIGH (ref 70–99)
Potassium: 3.9 mmol/L (ref 3.5–5.1)
Sodium: 132 mmol/L — ABNORMAL LOW (ref 135–145)
Total Bilirubin: 0.4 mg/dL (ref 0.3–1.2)
Total Protein: 8 g/dL (ref 6.5–8.1)

## 2019-12-25 LAB — URINALYSIS, ROUTINE W REFLEX MICROSCOPIC
Bilirubin Urine: NEGATIVE
Glucose, UA: NEGATIVE mg/dL
Hgb urine dipstick: NEGATIVE
Ketones, ur: 5 mg/dL — AB
Nitrite: POSITIVE — AB
Protein, ur: 30 mg/dL — AB
Specific Gravity, Urine: 1.015 (ref 1.005–1.030)
pH: 5 (ref 5.0–8.0)

## 2019-12-25 LAB — CBC
HCT: 39.7 % (ref 36.0–46.0)
Hemoglobin: 11.8 g/dL — ABNORMAL LOW (ref 12.0–15.0)
MCH: 22.3 pg — ABNORMAL LOW (ref 26.0–34.0)
MCHC: 29.7 g/dL — ABNORMAL LOW (ref 30.0–36.0)
MCV: 75 fL — ABNORMAL LOW (ref 80.0–100.0)
Platelets: 368 10*3/uL (ref 150–400)
RBC: 5.29 MIL/uL — ABNORMAL HIGH (ref 3.87–5.11)
RDW: 16.5 % — ABNORMAL HIGH (ref 11.5–15.5)
WBC: 4.9 10*3/uL (ref 4.0–10.5)
nRBC: 0 % (ref 0.0–0.2)

## 2019-12-25 LAB — LIPASE, BLOOD: Lipase: 23 U/L (ref 11–51)

## 2019-12-25 LAB — I-STAT BETA HCG BLOOD, ED (MC, WL, AP ONLY): I-stat hCG, quantitative: <5 m[IU]/mL (ref <5)

## 2019-12-25 MED ORDER — ONDANSETRON 4 MG PO TBDP
4.0000 mg | ORAL_TABLET | Freq: Three times a day (TID) | ORAL | 0 refills | Status: DC | PRN
Start: 2019-12-25 — End: 2021-05-22

## 2019-12-25 MED ORDER — ACETAMINOPHEN 325 MG PO TABS
650.0000 mg | ORAL_TABLET | Freq: Once | ORAL | Status: AC
  Administered 2019-12-25: 650 mg via ORAL
  Filled 2019-12-25: qty 2

## 2019-12-25 MED ORDER — IBUPROFEN 400 MG PO TABS
600.0000 mg | ORAL_TABLET | Freq: Once | ORAL | Status: AC
  Administered 2019-12-25: 600 mg via ORAL
  Filled 2019-12-25: qty 1

## 2019-12-25 MED ORDER — ONDANSETRON 4 MG PO TBDP
8.0000 mg | ORAL_TABLET | Freq: Once | ORAL | Status: AC
  Administered 2019-12-25: 8 mg via ORAL
  Filled 2019-12-25: qty 2

## 2019-12-25 NOTE — ED Notes (Signed)
Patient given discharge instructions. Questions were answered. Patient verbalized understanding of discharge instructions and care at home.  

## 2019-12-25 NOTE — ED Triage Notes (Signed)
Tested positive for COVID 2 days ago. I have a headache, N/V started yesterday.

## 2019-12-25 NOTE — ED Provider Notes (Signed)
[facility] DEPARTMENT Provider Note   CSN: [id number] Arrival date & time: 12/25/19  0732     History Chief Complaint  Patient presents with  . Covid/weakness  . Nausea  . Headache    [name] [name] is a 34 y.o. female.  HPI      [name] [name] is a 34 y.o. female, with a history of DM, HTN, obesity, presenting to the ED with headache and nausea/vomiting beginning yesterday.  She originally states she has had symptoms of cough, congestion, fever, body aches for the past couple weeks, however, patient was seen in the ED on August 13 and reported the symptoms for the previous 5 days.  She was diagnosed with COVID-19 infection at that time. No Covid vaccination.  She states her nausea and vomiting have prevented her from taking medications to treat her fever and body aches.  She also states she has not been administering her insulin because she has not felt well.   Denies shortness of breath, chest pain, abdominal pain, diarrhea, hematochezia/melena, urinary symptoms, lower extremity swelling/pain, or any other complaints.   Past Medical History:  Diagnosis Date  . Abnormal Pap smear   . Diabetes mellitus    type II  . GBS (group B streptococcus) UTI complicating pregnancy   . Herpes simplex without mention of complication   . Hypertension   . Irregular menses   . Obesity     Patient Active Problem List   Diagnosis Date Noted  . Anemia 04/15/2019  . Abdominal pain 03/14/2019  . Screening for malignant neoplasm of cervix 12/30/2018  . Type 2 diabetes mellitus without complication, without long-term current use of insulin ([place]) 12/30/2018  . Chest pain 05/20/2017  . History of tubal ligation   . Medication refill 07/22/2016  . Vaginal discharge 05/16/2016  . Constipation 03/04/2016  . Yeast infection 02/11/2016  . Alopecia areata 09/29/2013  . Type II diabetes mellitus, uncontrolled ([place]) 09/20/2007  . Herpes simplex virus (HSV)  infection 10/23/2006  . Obesity 07/09/2006  . RHINITIS, ALLERGIC 07/09/2006  . PAPANICOLAOU SMEAR, ABNORMAL 07/09/2006    Past Surgical History:  Procedure Laterality Date  . CESAREAN SECTION  01/30/2011   Procedure: CESAREAN SECTION;  Surgeon: [name], MD;  Location: [facility];  Service: Gynecology;  Laterality: N/A;  . CESAREAN SECTION N/A 10/22/2014   Procedure: CESAREAN SECTION;  Surgeon: [name], MD;  Location: [facility];  Service: Obstetrics;  Laterality: N/A;  . TUBAL LIGATION    . WISDOM TOOTH EXTRACTION       OB History    Gravida  4   Para  2   Term  1   Preterm  1   AB  2   Living  2     SAB  2   TAB      Ectopic      Multiple  0   Live Births  2           Family History  Problem Relation Age of Onset  . Diabetes Paternal Grandmother   . Hypertension Paternal Grandmother   . Diabetes Maternal Grandmother   . Diabetes Father   . Cancer Paternal Uncle     Social History   Tobacco Use  . Smoking status: Former Research scientist (life sciences)  . Smokeless tobacco: Never Used  Vaping Use  . Vaping Use: Never used  Substance Use Topics  . Alcohol use: Yes    Comment: "occassionally"  . Drug use:  No    Home Medications Prior to Admission medications   Medication Sig Start Date End Date Taking? Authorizing Provider  Insulin Glargine (LANTUS) 100 UNIT/ML Solostar Pen Inject 10-30 Units into the skin every morning. Patient taking differently: Inject 10 Units into the skin in the morning.  01/06/19  Yes Hensel, Jamal Collin, MD  acyclovir (ZOVIRAX) 400 MG tablet Take 1 tablet (400 mg total) by mouth 4 (four) times daily. Patient not taking: Reported on 12/25/2019 01/02/19   Margarita Mail, PA-C  famotidine (PEPCID) 20 MG tablet Take 1 tablet (20 mg total) by mouth 2 (two) times daily. Patient not taking: Reported on 12/25/2019 03/10/19   Recardo Evangelist, PA-C  glucose blood (FREESTYLE TEST STRIPS) test strip Check glucose 1 time per day and write result in  logbook 12/28/18   Sherene Sires, DO  glucose monitoring kit (FREESTYLE) monitoring kit 1 each by Does not apply route as needed for other. 12/28/18   Sherene Sires, DO  insulin degludec (TRESIBA FLEXTOUCH) 100 UNIT/ML SOPN FlexTouch Pen Inject 0.1 mLs (10 Units total) into the skin daily. Patient not taking: Reported on 12/25/2019 05/26/19   Zenia Resides, MD  Lancets (FREESTYLE) lancets Check glucose 1 time per day and write result in logbook 12/28/18   Sherene Sires, DO  naproxen (NAPROSYN) 500 MG tablet Take 1 tablet (500 mg total) by mouth 2 (two) times daily. Patient not taking: Reported on 12/25/2019 10/17/19   Veryl Speak, MD  ondansetron (ZOFRAN ODT) 4 MG disintegrating tablet Take 1 tablet (4 mg total) by mouth every 8 (eight) hours as needed for nausea or vomiting. 12/25/19   Alicyn Klann C, PA-C  Semaglutide,0.25 or 0.5MG/DOS, (OZEMPIC, 0.25 OR 0.5 MG/DOSE,) 2 MG/1.5ML SOPN Inject 0.25 mg into the skin once a week. Patient not taking: Reported on 12/25/2019 05/26/19   Zenia Resides, MD    Allergies    Metformin and related  Review of Systems   Review of Systems  Constitutional: Positive for fever.  HENT: Negative for trouble swallowing and voice change.   Respiratory: Positive for cough. Negative for shortness of breath.   Cardiovascular: Negative for chest pain and leg swelling.  Gastrointestinal: Positive for nausea and vomiting. Negative for abdominal pain, blood in stool and diarrhea.  Genitourinary: Negative for dysuria, flank pain, frequency, hematuria and urgency.  Musculoskeletal: Positive for myalgias.  All other systems reviewed and are negative.   Physical Exam Updated Vital Signs BP 111/67 (BP Location: Right Arm)   Pulse 76   Temp (!) 101.8 F (38.8 C) (Oral)   Resp 20   Ht _0  (1.549 m)   Wt 95.3 kg   LMP 12/16/2019   SpO2 100%   BMI 39.68 kg/m   Physical Exam Vitals and nursing note reviewed.  Constitutional:      General: She is not in acute  distress.    Appearance: She is well-developed. She is obese. She is not diaphoretic.  HENT:     Head: Normocephalic and atraumatic.     Mouth/Throat:     Mouth: Mucous membranes are moist.     Pharynx: Oropharynx is clear.  Eyes:     Conjunctiva/sclera: Conjunctivae normal.  Cardiovascular:     Rate and Rhythm: Normal rate and regular rhythm.     Pulses: Normal pulses.          Radial pulses are 2+ on the right side and 2+ on the left side.       Posterior tibial pulses  are 2+ on the right side and 2+ on the left side.     Heart sounds: Normal heart sounds.     Comments: Tactile temperature in the extremities appropriate and equal bilaterally. Pulmonary:     Effort: Pulmonary effort is normal. No respiratory distress.     Breath sounds: Normal breath sounds.  Abdominal:     Palpations: Abdomen is soft.     Tenderness: There is no abdominal tenderness. There is no guarding.  Musculoskeletal:     Cervical back: Neck supple.     Right lower leg: No edema.     Left lower leg: No edema.  Lymphadenopathy:     Cervical: No cervical adenopathy.  Skin:    General: Skin is warm and dry.  Neurological:     Mental Status: She is alert.  Psychiatric:        Mood and Affect: Mood and affect normal.        Speech: Speech normal.        Behavior: Behavior normal.     ED Results / Procedures / Treatments   Labs (all labs ordered are listed, but only abnormal results are displayed) Labs Reviewed  COMPREHENSIVE METABOLIC PANEL - Abnormal; Notable for the following components:      Result Value   Sodium 132 (*)    CO2 19 (*)    Glucose, Bld 204 (*)    All other components within normal limits  CBC - Abnormal; Notable for the following components:   RBC 5.29 (*)    Hemoglobin 11.8 (*)    MCV 75.0 (*)    MCH 22.3 (*)    MCHC 29.7 (*)    RDW 16.5 (*)    All other components within normal limits  URINALYSIS, ROUTINE W REFLEX MICROSCOPIC - Abnormal; Notable for the following  components:   APPearance HAZY (*)    Ketones, ur 5 (*)    Protein, ur 30 (*)    Nitrite POSITIVE (*)    Leukocytes,Ua SMALL (*)    Bacteria, UA FEW (*)    All other components within normal limits  LIPASE, BLOOD  I-STAT BETA HCG BLOOD, ED ([facility], [facility], [facility] ONLY)    EKG None  Radiology No results found.  Procedures Procedures (including critical care time)  Medications Ordered in ED Medications  acetaminophen (TYLENOL) tablet 650 mg (650 mg Oral Given 12/25/19 0755)  ondansetron (ZOFRAN-ODT) disintegrating tablet 8 mg (8 mg Oral Given 12/25/19 1257)  ibuprofen (ADVIL) tablet 600 mg (600 mg Oral Given 12/25/19 1257)    ED Course  I have reviewed the triage vital signs and the nursing notes.  Pertinent labs & imaging results that were available during my care of the patient were reviewed by me and considered in my medical decision making (see chart for details).  Clinical Course as of Dec 26 1440  Nancy Fetter Dec 25, 2019  1241 Denies any urinary symptoms. Multiple previous instances of positive nitrites.   Urinalysis, Routine w reflex microscopic(!) [[facility]]    Clinical Course User Index [[facility]] Janyce Ellinger, Helane Gunther, PA-C   MDM Rules/Calculators/A&P                          Patient complains of onset of nausea and vomiting as well as headache in the setting of recently diagnosed COVID-19 infection. Patient is nontoxic appearing, not tachypneic, not hypotensive, maintains excellent SPO2 on room air, and is in no apparent distress.  Initially febrile and  tachycardic.  Tachycardia improved with improvement in fever.  No vomiting here in the ED.  Tolerating PO fluids during ED course and prior to discharge.   I have reviewed the patient's chart to obtain more information.   I reviewed and interpreted the patient's labs and radiological studies. She does have hyperglycemia, however, no elevated anion gap.  My suspicion for DKA is low in this patient. Sodium corrects to 134, indicating barely lower  than normal hyponatremia. Patient has had nitrites in her urine across several UAs despite having no urinary symptoms.  She is not pregnant.  Shared decision making on this matter regarding initiating treatment.  The patient was given instructions for home care as well as return precautions. Patient voices understanding of these instructions, accepts the plan, and is comfortable with discharge.   Findings and plan of care discussed with Blanchie Dessert, MD due to patient's return to the ED within 72 hours.  Vitals:   12/25/19 0752 12/25/19 1106 12/25/19 1253 12/25/19 1421  BP: 119/85 117/71 111/67 128/79  Pulse: (!) 122 (!) 116 76 100  Resp: _0 Temp: (!) 102 F (38.9 C) (!) 101.5 F (38.6 C) (!) 101.8 F (38.8 C) 99.6 F (37.6 C)  TempSrc:  Oral Oral Oral  SpO2: 97% 97% 100% 99%  Weight:      Height:         Final Clinical Impression(s) / ED Diagnoses Final diagnoses:  COVID-19  Non-intractable vomiting with nausea, unspecified vomiting type    Rx / DC Orders ED Discharge Orders         Ordered    ondansetron (ZOFRAN ODT) 4 MG disintegrating tablet  Every 8 hours PRN     Discontinue  Reprint     12/25/19 1454           Lorayne Bender, PA-C 12/27/19 1449    Blanchie Dessert, MD 12/29/19 2235

## 2019-12-25 NOTE — ED Notes (Addendum)
Pt tolerated water and crackers well and had no complaints when asked.

## 2019-12-25 NOTE — ED Notes (Addendum)
Pt provide cup of ice water and graham crackers for Fluid/PO challenge per EDP, Shawn Joy.

## 2019-12-25 NOTE — Discharge Instructions (Signed)
General Viral Syndrome Care Instructions:  Your symptoms are likely consistent typical COVID-19 infection.  COVID-19 is caused by a virus. Viruses do not require or respond to antibiotics. Treatment is symptomatic care and it is important to note that these symptoms may last for 7-14 days.   Hand washing: Wash your hands throughout the day, but especially before and after touching the face, using the restroom, sneezing, coughing, or touching surfaces that have been coughed or sneezed upon. Hydration: Symptoms of most illnesses will be intensified and complicated by dehydration. Dehydration can also extend the duration of symptoms. Drink plenty of fluids and get plenty of rest. You should be drinking at least half a liter of water an hour to stay hydrated. Electrolyte drinks (ex. Gatorade, Powerade, Pedialyte) are also encouraged. You should be drinking enough fluids to make your urine light yellow, almost clear. If this is not the case, you are not drinking enough water. Please note that some of the treatments indicated below will not be effective if you are not adequately hydrated. Diet: Please concentrate on hydration, however, you may introduce food slowly.  Start with a clear liquid diet, progressed to a full liquid diet, and then bland solids as you are able. Pain or fever: Ibuprofen, Naproxen, or acetaminophen (generic for Tylenol) for pain or fever.  Antiinflammatory medications: Take 600 mg of ibuprofen every 6 hours or 440 mg (over the counter dose) to 500 mg (prescription dose) of naproxen every 12 hours for the next 3 days. After this time, these medications may be used as needed for pain. Take these medications with food to avoid upset stomach. Choose only one of these medications, do not take them together. Acetaminophen (generic for Tylenol): Should you continue to have additional pain while taking the ibuprofen or naproxen, you may add in acetaminophen as needed. Your daily total maximum  amount of acetaminophen from all sources should be limited to 4000mg /day for persons without liver problems, or 2000mg /day for those with liver problems. Nausea/vomiting: Use the ondansetron (generic for Zofran) for nausea or vomiting.  This medication may not prevent all vomiting or nausea, but can help facilitate better hydration. Things that can help with nausea/vomiting also include peppermint/menthol candies, vitamin B12, and ginger. Diarrhea: May use medications such as loperamide (Imodium) or Bismuth subsalicylate (Pepto-Bismol). Zyrtec or Claritin: May add these medication daily to control underlying symptoms of congestion, sneezing, and other signs of allergies.  These medications are available over-the-counter. Generics: Cetirizine (generic for Zyrtec) and loratadine (generic for Claritin). Fluticasone: Use fluticasone (generic for Flonase), as directed, for nasal and sinus congestion.  This medication is available over-the-counter. Congestion: Plain guaifenesin (generic for plain Mucinex) may help relieve congestion. Saline sinus rinses and saline nasal sprays may also help relieve congestion. If you do not have high blood pressure, heart problems, or an allergy to such medications, you may also try phenylephrine or Sudafed. Sore throat: Warm liquids or Chloraseptic spray may help soothe a sore throat. Gargle twice a day with a salt water solution made from a half teaspoon of salt in a cup of warm water.  Follow up: Follow up with a primary care provider within the next two weeks should symptoms fail to resolve. Return: Return to the ED for significantly worsening symptoms, shortness of breath, persistent vomiting, large amounts of blood in stool, or any other major concerns.  For prescription assistance, may try using prescription discount sites or apps, such as goodrx.com  COVID-19 isolation recommendations  Patients who have symptoms  consistent with COVID-19 should self isolate  until: At least 3 days (72 hours) have passed since recovery, defined as resolution of fever without the use of fever reducing medications and improvement in respiratory symptoms (e.g., cough, shortness of breath), and At least 7 days have passed since symptoms first appeared. Retesting is not required and not recommended as patients can continue to test positive for several weeks despite lack of symptoms.

## 2019-12-25 NOTE — ED Notes (Signed)
Pt completed PO challenge with no nausea/vomiting at this time.

## 2019-12-28 ENCOUNTER — Inpatient Hospital Stay (HOSPITAL_COMMUNITY)
Admission: EM | Admit: 2019-12-28 | Discharge: 2020-01-01 | DRG: 871 | Discharge status: Against Medical Advice | Payer: Medicaid Other | Attending: Internal Medicine | Admitting: Internal Medicine

## 2019-12-28 ENCOUNTER — Other Ambulatory Visit: Payer: Self-pay

## 2019-12-28 ENCOUNTER — Encounter (HOSPITAL_COMMUNITY): Payer: Self-pay | Admitting: *Deleted

## 2019-12-28 ENCOUNTER — Emergency Department (HOSPITAL_COMMUNITY): Payer: Medicaid Other

## 2019-12-28 DIAGNOSIS — Z809 Family history of malignant neoplasm, unspecified: Secondary | ICD-10-CM | POA: Diagnosis not present

## 2019-12-28 DIAGNOSIS — R059 Cough, unspecified: Secondary | ICD-10-CM

## 2019-12-28 DIAGNOSIS — Z87891 Personal history of nicotine dependence: Secondary | ICD-10-CM

## 2019-12-28 DIAGNOSIS — Z8249 Family history of ischemic heart disease and other diseases of the circulatory system: Secondary | ICD-10-CM

## 2019-12-28 DIAGNOSIS — R112 Nausea with vomiting, unspecified: Secondary | ICD-10-CM | POA: Diagnosis present

## 2019-12-28 DIAGNOSIS — Z6841 Body Mass Index (BMI) 40.0 and over, adult: Secondary | ICD-10-CM | POA: Diagnosis not present

## 2019-12-28 DIAGNOSIS — R05 Cough: Secondary | ICD-10-CM | POA: Diagnosis present

## 2019-12-28 DIAGNOSIS — J9601 Acute respiratory failure with hypoxia: Secondary | ICD-10-CM | POA: Diagnosis present

## 2019-12-28 DIAGNOSIS — E119 Type 2 diabetes mellitus without complications: Secondary | ICD-10-CM

## 2019-12-28 DIAGNOSIS — A084 Viral intestinal infection, unspecified: Secondary | ICD-10-CM | POA: Diagnosis present

## 2019-12-28 DIAGNOSIS — J1282 Pneumonia due to coronavirus disease 2019: Secondary | ICD-10-CM | POA: Diagnosis present

## 2019-12-28 DIAGNOSIS — N390 Urinary tract infection, site not specified: Secondary | ICD-10-CM | POA: Diagnosis present

## 2019-12-28 DIAGNOSIS — N179 Acute kidney failure, unspecified: Secondary | ICD-10-CM | POA: Diagnosis present

## 2019-12-28 DIAGNOSIS — E860 Dehydration: Secondary | ICD-10-CM | POA: Diagnosis present

## 2019-12-28 DIAGNOSIS — E1110 Type 2 diabetes mellitus with ketoacidosis without coma: Secondary | ICD-10-CM

## 2019-12-28 DIAGNOSIS — N3000 Acute cystitis without hematuria: Secondary | ICD-10-CM | POA: Diagnosis not present

## 2019-12-28 DIAGNOSIS — R531 Weakness: Secondary | ICD-10-CM

## 2019-12-28 DIAGNOSIS — B9620 Unspecified Escherichia coli [E. coli] as the cause of diseases classified elsewhere: Secondary | ICD-10-CM | POA: Diagnosis present

## 2019-12-28 DIAGNOSIS — Z79899 Other long term (current) drug therapy: Secondary | ICD-10-CM

## 2019-12-28 DIAGNOSIS — Z833 Family history of diabetes mellitus: Secondary | ICD-10-CM

## 2019-12-28 DIAGNOSIS — R110 Nausea: Secondary | ICD-10-CM

## 2019-12-28 DIAGNOSIS — A4189 Other specified sepsis: Principal | ICD-10-CM | POA: Diagnosis present

## 2019-12-28 DIAGNOSIS — E1010 Type 1 diabetes mellitus with ketoacidosis without coma: Secondary | ICD-10-CM | POA: Diagnosis present

## 2019-12-28 DIAGNOSIS — R1110 Vomiting, unspecified: Secondary | ICD-10-CM

## 2019-12-28 DIAGNOSIS — Z5329 Procedure and treatment not carried out because of patient's decision for other reasons: Secondary | ICD-10-CM | POA: Diagnosis present

## 2019-12-28 DIAGNOSIS — Z794 Long term (current) use of insulin: Secondary | ICD-10-CM | POA: Diagnosis not present

## 2019-12-28 DIAGNOSIS — U071 COVID-19: Secondary | ICD-10-CM | POA: Diagnosis present

## 2019-12-28 DIAGNOSIS — E1165 Type 2 diabetes mellitus with hyperglycemia: Secondary | ICD-10-CM

## 2019-12-28 DIAGNOSIS — Z888 Allergy status to other drugs, medicaments and biological substances status: Secondary | ICD-10-CM

## 2019-12-28 DIAGNOSIS — A419 Sepsis, unspecified organism: Secondary | ICD-10-CM

## 2019-12-28 DIAGNOSIS — I10 Essential (primary) hypertension: Secondary | ICD-10-CM | POA: Diagnosis present

## 2019-12-28 DIAGNOSIS — T383X6A Underdosing of insulin and oral hypoglycemic [antidiabetic] drugs, initial encounter: Secondary | ICD-10-CM | POA: Diagnosis present

## 2019-12-28 DIAGNOSIS — R739 Hyperglycemia, unspecified: Secondary | ICD-10-CM

## 2019-12-28 LAB — URINALYSIS, ROUTINE W REFLEX MICROSCOPIC
Bilirubin Urine: NEGATIVE
Glucose, UA: >=500 mg/dL — AB
Hgb urine dipstick: NEGATIVE
Ketones, ur: 80 mg/dL — AB
Nitrite: NEGATIVE
Protein, ur: 100 mg/dL — AB
Specific Gravity, Urine: 1.013 (ref 1.005–1.030)
WBC, UA: >50 WBC/hpf — ABNORMAL HIGH (ref 0–5)
pH: 5 (ref 5.0–8.0)

## 2019-12-28 LAB — CBC WITH DIFFERENTIAL/PLATELET
Abs Immature Granulocytes: 0 10*3/uL (ref 0.00–0.07)
Basophils Absolute: 0 10*3/uL (ref 0.0–0.1)
Basophils Relative: 0 %
Eosinophils Absolute: 0.1 10*3/uL (ref 0.0–0.5)
Eosinophils Relative: 1 %
HCT: 39.8 % (ref 36.0–46.0)
Hemoglobin: 11.8 g/dL — ABNORMAL LOW (ref 12.0–15.0)
Lymphocytes Relative: 7 %
Lymphs Abs: 0.5 10*3/uL — ABNORMAL LOW (ref 0.7–4.0)
MCH: 22.2 pg — ABNORMAL LOW (ref 26.0–34.0)
MCHC: 29.6 g/dL — ABNORMAL LOW (ref 30.0–36.0)
MCV: 74.8 fL — ABNORMAL LOW (ref 80.0–100.0)
Monocytes Absolute: 0.1 10*3/uL (ref 0.1–1.0)
Monocytes Relative: 1 %
Neutro Abs: 5.9 10*3/uL (ref 1.7–7.7)
Neutrophils Relative %: 91 %
Platelets: 271 10*3/uL (ref 150–400)
RBC: 5.32 MIL/uL — ABNORMAL HIGH (ref 3.87–5.11)
RDW: 16.8 % — ABNORMAL HIGH (ref 11.5–15.5)
WBC: 6.5 10*3/uL (ref 4.0–10.5)
nRBC: 0 % (ref 0.0–0.2)
nRBC: 0 /100 WBC

## 2019-12-28 LAB — COMPREHENSIVE METABOLIC PANEL
ALT: 20 U/L (ref 0–44)
AST: 25 U/L (ref 15–41)
Albumin: 3.2 g/dL — ABNORMAL LOW (ref 3.5–5.0)
Alkaline Phosphatase: 80 U/L (ref 38–126)
Anion gap: 15 (ref 5–15)
BUN: 18 mg/dL (ref 6–20)
CO2: 16 mmol/L — ABNORMAL LOW (ref 22–32)
Calcium: 8.5 mg/dL — ABNORMAL LOW (ref 8.9–10.3)
Chloride: 102 mmol/L (ref 98–111)
Creatinine, Ser: 1.03 mg/dL — ABNORMAL HIGH (ref 0.44–1.00)
GFR calc Af Amer: >60 mL/min (ref >60)
GFR calc non Af Amer: >60 mL/min (ref >60)
Glucose, Bld: 365 mg/dL — ABNORMAL HIGH (ref 70–99)
Potassium: 4 mmol/L (ref 3.5–5.1)
Sodium: 133 mmol/L — ABNORMAL LOW (ref 135–145)
Total Bilirubin: 0.5 mg/dL (ref 0.3–1.2)
Total Protein: 7.5 g/dL (ref 6.5–8.1)

## 2019-12-28 LAB — I-STAT VENOUS BLOOD GAS, ED
Acid-base deficit: 7 mmol/L — ABNORMAL HIGH (ref 0.0–2.0)
Bicarbonate: 18.7 mmol/L — ABNORMAL LOW (ref 20.0–28.0)
Calcium, Ion: 0.96 mmol/L — ABNORMAL LOW (ref 1.15–1.40)
HCT: 36 % (ref 36.0–46.0)
Hemoglobin: 12.2 g/dL (ref 12.0–15.0)
O2 Saturation: 83 %
Potassium: 5.7 mmol/L — ABNORMAL HIGH (ref 3.5–5.1)
Sodium: 133 mmol/L — ABNORMAL LOW (ref 135–145)
TCO2: 20 mmol/L — ABNORMAL LOW (ref 22–32)
pCO2, Ven: 35.3 mmHg — ABNORMAL LOW (ref 44.0–60.0)
pH, Ven: 7.332 (ref 7.250–7.430)
pO2, Ven: 50 mmHg — ABNORMAL HIGH (ref 32.0–45.0)

## 2019-12-28 LAB — RAPID URINE DRUG SCREEN, HOSP PERFORMED
Amphetamines: NOT DETECTED
Barbiturates: NOT DETECTED
Benzodiazepines: NOT DETECTED
Cocaine: NOT DETECTED
Opiates: NOT DETECTED
Tetrahydrocannabinol: NOT DETECTED

## 2019-12-28 LAB — LACTIC ACID, PLASMA
Lactic Acid, Venous: 1.4 mmol/L (ref 0.5–1.9)
Lactic Acid, Venous: 2.8 mmol/L (ref 0.5–1.9)

## 2019-12-28 LAB — HEMOGLOBIN A1C
Hgb A1c MFr Bld: 11.3 % — ABNORMAL HIGH (ref 4.8–5.6)
Mean Plasma Glucose: 277.61 mg/dL

## 2019-12-28 LAB — CBG MONITORING, ED
Glucose-Capillary: 313 mg/dL — ABNORMAL HIGH (ref 70–99)
Glucose-Capillary: 193 mg/dL — ABNORMAL HIGH (ref 70–99)

## 2019-12-28 LAB — C-REACTIVE PROTEIN: CRP: 11.5 mg/dL — ABNORMAL HIGH (ref <1.0)

## 2019-12-28 LAB — BETA-HYDROXYBUTYRIC ACID: Beta-Hydroxybutyric Acid: 0.51 mmol/L — ABNORMAL HIGH (ref 0.05–0.27)

## 2019-12-28 LAB — FERRITIN: Ferritin: 258 ng/mL (ref 11–307)

## 2019-12-28 LAB — I-STAT BETA HCG BLOOD, ED (MC, WL, AP ONLY): I-stat hCG, quantitative: <5 m[IU]/mL (ref <5)

## 2019-12-28 LAB — ABO/RH: ABO/RH(D): B NEG

## 2019-12-28 LAB — FIBRINOGEN: Fibrinogen: 559 mg/dL — ABNORMAL HIGH (ref 210–475)

## 2019-12-28 LAB — D-DIMER, QUANTITATIVE: D-Dimer, Quant: 0.92 ug/mL-FEU — ABNORMAL HIGH (ref 0.00–0.50)

## 2019-12-28 MED ORDER — LACTATED RINGERS IV BOLUS
1000.0000 mL | Freq: Once | INTRAVENOUS | Status: AC
  Administered 2019-12-28: 1000 mL via INTRAVENOUS

## 2019-12-28 MED ORDER — DEXTROSE 50 % IV SOLN: 0.0000 mL | INTRAVENOUS | Status: DC | PRN

## 2019-12-28 MED ORDER — SODIUM CHLORIDE 0.9 % IV SOLN
200.0000 mg | Freq: Once | INTRAVENOUS | Status: AC
  Administered 2019-12-28: 200 mg via INTRAVENOUS
  Filled 2019-12-28: qty 200

## 2019-12-28 MED ORDER — ASCORBIC ACID 500 MG PO TABS
500.0000 mg | ORAL_TABLET | Freq: Every day | ORAL | Status: DC
  Administered 2019-12-28 – 2020-01-01 (×5): 500 mg via ORAL
  Filled 2019-12-28 (×5): qty 1

## 2019-12-28 MED ORDER — DEXTROSE-NACL 5-0.45 % IV SOLN: INTRAVENOUS | Status: DC

## 2019-12-28 MED ORDER — INSULIN REGULAR(HUMAN) IN NACL 100-0.9 UT/100ML-% IV SOLN
INTRAVENOUS | Status: AC
  Administered 2019-12-28: 9.5 [IU]/h via INTRAVENOUS
  Filled 2019-12-28: qty 100

## 2019-12-28 MED ORDER — LACTATED RINGERS IV BOLUS: 20.0000 mL/kg | Freq: Once | INTRAVENOUS | Status: DC

## 2019-12-28 MED ORDER — SODIUM CHLORIDE 0.9 % IV SOLN
1.0000 g | INTRAVENOUS | Status: AC
  Administered 2019-12-29 – 2019-12-30 (×2): 1 g via INTRAVENOUS
  Filled 2019-12-28: qty 10
  Filled 2019-12-28: qty 1

## 2019-12-28 MED ORDER — BARICITINIB 2 MG PO TABS
4.0000 mg | ORAL_TABLET | Freq: Every day | ORAL | Status: DC
  Administered 2019-12-28 – 2020-01-01 (×5): 4 mg via ORAL
  Filled 2019-12-28 (×6): qty 2

## 2019-12-28 MED ORDER — ZINC SULFATE 220 (50 ZN) MG PO CAPS
220.0000 mg | ORAL_CAPSULE | Freq: Every day | ORAL | Status: DC
  Administered 2019-12-28 – 2020-01-01 (×5): 220 mg via ORAL
  Filled 2019-12-28 (×5): qty 1

## 2019-12-28 MED ORDER — GUAIFENESIN-DM 100-10 MG/5ML PO SYRP: 10.0000 mL | ORAL_SOLUTION | ORAL | Status: DC | PRN

## 2019-12-28 MED ORDER — POTASSIUM CHLORIDE 10 MEQ/100ML IV SOLN
10.0000 meq | INTRAVENOUS | Status: AC
  Administered 2019-12-28: 10 meq via INTRAVENOUS
  Filled 2019-12-28: qty 100

## 2019-12-28 MED ORDER — DEXTROSE-NACL 5-0.9 % IV SOLN: INTRAVENOUS | Status: DC

## 2019-12-28 MED ORDER — METHYLPREDNISOLONE SODIUM SUCC 125 MG IJ SOLR
0.5000 mg/kg | Freq: Two times a day (BID) | INTRAMUSCULAR | Status: DC
  Administered 2019-12-28 – 2019-12-31 (×7): 47.5 mg via INTRAVENOUS
  Filled 2019-12-28 (×7): qty 2

## 2019-12-28 MED ORDER — SODIUM CHLORIDE 0.9 % IV SOLN
1.0000 g | Freq: Once | INTRAVENOUS | Status: AC
  Administered 2019-12-28: 1 g via INTRAVENOUS
  Filled 2019-12-28: qty 10

## 2019-12-28 MED ORDER — INSULIN ASPART 100 UNIT/ML ~~LOC~~ SOLN
7.0000 [IU] | Freq: Once | SUBCUTANEOUS | Status: AC
  Administered 2019-12-28: 7 [IU] via SUBCUTANEOUS

## 2019-12-28 MED ORDER — SODIUM CHLORIDE 0.9 % IV SOLN: INTRAVENOUS | Status: DC

## 2019-12-28 MED ORDER — PROMETHAZINE HCL 25 MG/ML IJ SOLN
25.0000 mg | Freq: Four times a day (QID) | INTRAMUSCULAR | Status: DC | PRN
  Administered 2019-12-28: 25 mg via INTRAVENOUS
  Filled 2019-12-28: qty 1

## 2019-12-28 MED ORDER — DEXTROSE IN LACTATED RINGERS 5 % IV SOLN: INTRAVENOUS | Status: DC

## 2019-12-28 MED ORDER — ACETAMINOPHEN 325 MG PO TABS
650.0000 mg | ORAL_TABLET | Freq: Once | ORAL | Status: AC | PRN
  Administered 2019-12-28: 650 mg via ORAL
  Filled 2019-12-28: qty 2

## 2019-12-28 MED ORDER — PROMETHAZINE HCL 25 MG/ML IJ SOLN
12.5000 mg | Freq: Once | INTRAMUSCULAR | Status: AC
  Administered 2019-12-28: 12.5 mg via INTRAVENOUS
  Filled 2019-12-28: qty 1

## 2019-12-28 MED ORDER — VITAMIN D 25 MCG (1000 UNIT) PO TABS
1000.0000 [IU] | ORAL_TABLET | Freq: Every day | ORAL | Status: DC
  Administered 2019-12-28 – 2020-01-01 (×5): 1000 [IU] via ORAL
  Filled 2019-12-28 (×5): qty 1

## 2019-12-28 MED ORDER — ENOXAPARIN SODIUM 40 MG/0.4ML ~~LOC~~ SOLN
40.0000 mg | SUBCUTANEOUS | Status: DC
  Administered 2019-12-28 – 2019-12-31 (×4): 40 mg via SUBCUTANEOUS
  Filled 2019-12-28 (×4): qty 0.4

## 2019-12-28 MED ORDER — SODIUM CHLORIDE 0.9 % IV SOLN
100.0000 mg | Freq: Every day | INTRAVENOUS | Status: AC
  Administered 2019-12-29 – 2020-01-01 (×4): 100 mg via INTRAVENOUS
  Filled 2019-12-28 (×4): qty 20

## 2019-12-28 MED ORDER — HYDROCOD POLST-CPM POLST ER 10-8 MG/5ML PO SUER
5.0000 mL | Freq: Two times a day (BID) | ORAL | Status: DC | PRN
  Administered 2019-12-29 – 2019-12-31 (×4): 5 mL via ORAL
  Filled 2019-12-28 (×4): qty 5

## 2019-12-28 MED ORDER — IBUPROFEN 400 MG PO TABS
600.0000 mg | ORAL_TABLET | Freq: Once | ORAL | Status: AC
  Administered 2019-12-28: 600 mg via ORAL
  Filled 2019-12-28: qty 1

## 2019-12-28 NOTE — ED Notes (Signed)
Unable to collect second cultures . Pt is a hard stick. Attempted 3x.

## 2019-12-28 NOTE — ED Provider Notes (Signed)
[facility] EMERGENCY DEPARTMENT Provider Note   CSN: [id number] Arrival date & time: 12/28/19  3570     History Chief Complaint  Patient presents with  . Chest Pain  . Emesis  . Hyperglycemia    [name] [name] is a 34 y.o. female.  HPI      [name] [name] is a 34 y.o. female, with a history of DM type II, obesity, HTN, presenting to the ED primarily complaining of generalized weakness as well as nausea and vomiting. She has been experiencing cough, fever, body aches, nausea, vomiting, intermittent headaches, intermittent chest soreness with coughing, for about the past 5 to 6 days. She was diagnosed with COVID-19 on August 13. A consult was placed for monoclonal antibody infusion and it appears a phone call was made to the patient, however, patient states she did not make contact with them.  She has not been taking her insulin stating, "I just have not been feeling well and I have been too weak to press the button on the pen." This is her third visit to the ED for her symptoms.  During her last visit, she was prescribed Zofran.  She states she has been taking this medication intermittently with last dose yesterday.  She voices frustration that the Zofran has not permanently resolved her nausea and vomiting.  Denies diarrhea, hematochezia/melena, urinary symptoms, or any other complaints.     Past Medical History:  Diagnosis Date  . Abnormal Pap smear   . Diabetes mellitus    type II  . GBS (group B streptococcus) UTI complicating pregnancy   . Herpes simplex without mention of complication   . Hypertension   . Irregular menses   . Obesity     Patient Active Problem List   Diagnosis Date Noted  . Anemia 04/15/2019  . Abdominal pain 03/14/2019  . Screening for malignant neoplasm of cervix 12/30/2018  . Type 2 diabetes mellitus without complication, without long-term current use of insulin ([place]) 12/30/2018  . Chest pain 05/20/2017  . History of  tubal ligation   . Medication refill 07/22/2016  . Vaginal discharge 05/16/2016  . Constipation 03/04/2016  . Yeast infection 02/11/2016  . Alopecia areata 09/29/2013  . Type II diabetes mellitus, uncontrolled ([place]) 09/20/2007  . 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 Obesity 07/09/2006  . RHINITIS, ALLERGIC 07/09/2006  . PAPANICOLAOU SMEAR, ABNORMAL 07/09/2006    Past Surgical History:  Procedure Laterality Date  . CESAREAN SECTION  01/30/2011   Procedure: CESAREAN SECTION;  Surgeon: [name], MD;  Location: [facility];  Service: Gynecology;  Laterality: N/A;  . CESAREAN SECTION N/A 10/22/2014   Procedure: CESAREAN SECTION;  Surgeon: [name], MD;  Location: [facility];  Service: Obstetrics;  Laterality: N/A;  . TUBAL LIGATION    . WISDOM TOOTH EXTRACTION       OB History    Gravida  4   Para  2   Term  1   Preterm  1   AB  2   Living  2     SAB  2   TAB      Ectopic      Multiple  0   Live Births  2           Family History  Problem Relation Age of Onset  . Diabetes Paternal Grandmother   . Hypertension Paternal Grandmother   . Diabetes Maternal Grandmother   . Diabetes Father   . Cancer  Paternal Uncle     Social History   Tobacco Use  . Smoking status: Former Research scientist (life sciences)  . Smokeless tobacco: Never Used  Vaping Use  . Vaping Use: Never used  Substance Use Topics  . Alcohol use: Yes    Comment: "occassionally"  . Drug use: No    Home Medications Prior to Admission medications   Medication Sig Start Date End Date Taking? Authorizing Provider  glucose blood (FREESTYLE TEST STRIPS) test strip Check glucose 1 time per day and write result in logbook Patient taking differently: 1 each by Other route See admin instructions. Check glucose 1 time per day and write result in logbook 12/28/18  Yes Sherene Sires, DO  glucose monitoring kit (FREESTYLE) monitoring kit 1 each by Does not apply route as needed for other. 12/28/18  Yes Sherene Sires, DO  Insulin Glargine (LANTUS) 100 UNIT/ML Solostar Pen Inject 10-30 Units into the skin every morning. Patient taking differently: Inject 10 Units into the skin in the morning.  01/06/19  Yes Hensel, Jamal Collin, MD  Lancets (FREESTYLE) lancets Check glucose 1 time per day and write result in logbook Patient taking differently: 1 each by Other route See admin instructions. Check glucose 1 time per day and write result in logbook 12/28/18  Yes Bland, Scott, DO  ondansetron (ZOFRAN ODT) 4 MG disintegrating tablet Take 1 tablet (4 mg total) by mouth every 8 (eight) hours as needed for nausea or vomiting. 12/25/19  Yes Willye Javier C, PA-C  acyclovir (ZOVIRAX) 400 MG tablet Take 1 tablet (400 mg total) by mouth 4 (four) times daily. Patient not taking: Reported on 12/25/2019 01/02/19   Margarita Mail, PA-C  famotidine (PEPCID) 20 MG tablet Take 1 tablet (20 mg total) by mouth 2 (two) times daily. Patient not taking: Reported on 12/25/2019 03/10/19   Recardo Evangelist, PA-C  insulin degludec (TRESIBA FLEXTOUCH) 100 UNIT/ML SOPN FlexTouch Pen Inject 0.1 mLs (10 Units total) into the skin daily. Patient not taking: Reported on 12/25/2019 05/26/19   Zenia Resides, MD  naproxen (NAPROSYN) 500 MG tablet Take 1 tablet (500 mg total) by mouth 2 (two) times daily. Patient not taking: Reported on 12/25/2019 10/17/19   Veryl Speak, MD  Semaglutide,0.25 or 0.5MG/DOS, (OZEMPIC, 0.25 OR 0.5 MG/DOSE,) 2 MG/1.5ML SOPN Inject 0.25 mg into the skin once a week. Patient not taking: Reported on 12/25/2019 05/26/19   Zenia Resides, MD    Allergies    Metformin and related  Review of Systems   Review of Systems  Constitutional: Positive for chills and fever.  Respiratory: Positive for cough. Negative for shortness of breath.   Cardiovascular: Negative for leg swelling.  Gastrointestinal: Positive for nausea and vomiting. Negative for abdominal pain, blood in stool and diarrhea.  Musculoskeletal: Positive for  myalgias.  Neurological: Positive for weakness (generalized). Negative for syncope.  All other systems reviewed and are negative.   Physical Exam Updated Vital Signs BP 124/76 (BP Location: Left Arm)   Pulse (!) 124   Temp (!) 101 F (38.3 C) (Oral)   Resp 18   LMP 12/16/2019   SpO2 94%   Physical Exam Vitals and nursing note reviewed.  Constitutional:      General: She is not in acute distress.    Appearance: She is well-developed. She is not diaphoretic.  HENT:     Head: Normocephalic and atraumatic.     Mouth/Throat:     Mouth: Mucous membranes are dry.     Pharynx: Oropharynx is  clear.  Eyes:     Conjunctiva/sclera: Conjunctivae normal.  Cardiovascular:     Rate and Rhythm: Regular rhythm. Tachycardia present.     Pulses: Normal pulses.          Radial pulses are 2+ on the right side and 2+ on the left side.       Posterior tibial pulses are 2+ on the right side and 2+ on the left side.     Heart sounds: Normal heart sounds.     Comments: Tactile temperature in the extremities appropriate and equal bilaterally. Pulmonary:     Effort: Pulmonary effort is normal. No respiratory distress.     Breath sounds: Normal breath sounds.     Comments: No increased work of breathing.  Speaks in full sentences without difficulty. Abdominal:     Palpations: Abdomen is soft.     Tenderness: There is no abdominal tenderness. There is no guarding.  Musculoskeletal:     Cervical back: Neck supple.     Right lower leg: No edema.     Left lower leg: No edema.     Comments: Patient has tenderness anywhere she has palpated whether it is in the extremities, chest, abdomen.  Lymphadenopathy:     Cervical: No cervical adenopathy.  Skin:    General: Skin is warm and dry.  Neurological:     Mental Status: She is alert.  Psychiatric:        Mood and Affect: Mood and affect normal.        Speech: Speech normal.        Behavior: Behavior normal.     ED Results / Procedures /  Treatments   Labs (all labs ordered are listed, but only abnormal results are displayed) Labs Reviewed  COMPREHENSIVE METABOLIC PANEL - Abnormal; Notable for the following components:      Result Value   Sodium 133 (*)    CO2 16 (*)    Glucose, Bld 365 (*)    Creatinine, Ser 1.03 (*)    Calcium 8.5 (*)    Albumin 3.2 (*)    All other components within normal limits  CBC WITH DIFFERENTIAL/PLATELET - Abnormal; Notable for the following components:   RBC 5.32 (*)    Hemoglobin 11.8 (*)    MCV 74.8 (*)    MCH 22.2 (*)    MCHC 29.6 (*)    RDW 16.8 (*)    Lymphs Abs 0.5 (*)    All other components within normal limits  URINALYSIS, ROUTINE W REFLEX MICROSCOPIC - Abnormal; Notable for the following components:   APPearance HAZY (*)    Glucose, UA >=500 (*)    Ketones, ur 80 (*)    Protein, ur 100 (*)    Leukocytes,Ua LARGE (*)    WBC, UA >50 (*)    Bacteria, UA MANY (*)    All other components within normal limits  I-STAT VENOUS BLOOD GAS, ED - Abnormal; Notable for the following components:   pCO2, Ven 35.3 (*)    pO2, Ven 50.0 (*)    Bicarbonate 18.7 (*)    TCO2 20 (*)    Acid-base deficit 7.0 (*)    Sodium 133 (*)    Potassium 5.7 (*)    Calcium, Ion 0.96 (*)    All other components within normal limits  CBG MONITORING, ED - Abnormal; Notable for the following components:   Glucose-Capillary 313 (*)    All other components within normal limits  URINE CULTURE  LACTIC ACID,  PLASMA  RAPID URINE DRUG SCREEN, HOSP PERFORMED  LACTIC ACID, PLASMA  I-STAT BETA HCG BLOOD, ED ([facility], [facility], [facility] ONLY)    EKG EKG Interpretation  Date/Time:  Wednesday December 28 2019 09:31:11 EDT Ventricular Rate:  129 PR Interval:  140 QRS Duration: 66 QT Interval:  314 QTC Calculation: 460 R Axis:   85 Text Interpretation: Sinus tachycardia Otherwise normal ECG Confirmed by Quintella Reichert [phone]) on 12/28/2019 3:24:38 PM   Radiology DG Chest 2 View  Result Date: 12/28/2019 CLINICAL DATA:   COVID.  Cough.  Weakness.  Nausea.  Vomiting. EXAM: CHEST - 2 VIEW COMPARISON:  Prior chest radiographs 12/23/2019 and earlier. FINDINGS: Shallow inspiration radiograph. Heart size within normal limits. New from the prior examination of 12/23/2019, there are fairly extensive multifocal bilateral airspace opacities. No evidence of pleural effusion or pneumothorax. No acute bony abnormality identified. IMPRESSION: Fairly extensive multifocal bilateral airspace opacities, new as compared to the prior examination of 12/23/2019. Findings likely reflect atypical/viral pneumonia given the provided history of COVID positivity. Electronically Signed   By: Kellie Simmering DO   On: 12/28/2019 10:14    Procedures Procedures (including critical care time)  Medications Ordered in ED Medications  acetaminophen (TYLENOL) tablet 650 mg (650 mg Oral Given 12/28/19 0942)  promethazine (PHENERGAN) injection 12.5 mg (12.5 mg Intravenous Given 12/28/19 1650)  lactated ringers bolus 1,000 mL (0 mLs Intravenous Stopped 12/28/19 1857)  ibuprofen (ADVIL) tablet 600 mg (600 mg Oral Given 12/28/19 1651)  insulin aspart (novoLOG) injection 7 Units (7 Units Subcutaneous Given 12/28/19 1740)    ED Course  I have reviewed the triage vital signs and the nursing notes.  Pertinent labs & imaging results that were available during my care of the patient were reviewed by me and considered in my medical decision making (see chart for details).  Clinical Course as of Dec 27 2012  Wed Dec 28, 2019  1946 Spoke with Dr. Marlowe Sax, hospitalist. Agrees to admit the patient.   [SJ]    Clinical Course User Index [SJ] Abass Misener, Helane Gunther, PA-C   MDM Rules/Calculators/A&P                          Patient presents for the third time with symptoms of COVID-19. She arrives febrile and tachycardic.  Nontoxic appearing, no tachypnea, no hypotension, maintains adequate SPO2 on room air. She has evidence that she has not been caring for herself, citing  generalized weakness and overall feeling unwell. She is hyperglycemic and dry on exam.  Low suspicion for DKA though she does have evidence of dehydration with decreased CO2 and elevation in her creatinine.  She continues to have abnormalities in her UA and continues to deny urinary symptoms, however, due to these persistent abnormalities, I decided to initiate antibiotic therapy.  Due to the patient's multiple ED visits for the same complaint and the fact that she is apparently unable to manage her symptoms at home while maintaining her baseline health, she may benefit from at least observation admission overnight.  Additionally, the ability for [place] to rehydrate the patient with IV fluids is reduced due to her Covid positive status.  Findings and plan of care discussed with Quintella Reichert, MD.   Vitals:   12/28/19 1316 12/28/19 1651 12/28/19 1655 12/28/19 1657  BP: 124/76  120/81   Pulse: (!) 124   (!) 114  Resp: 18     Temp: (!) 101 F (38.3 C) (!) 102.2 F (39  C)    TempSrc: Oral Oral    SpO2: 94%   92%    Final Clinical Impression(s) / ED Diagnoses Final diagnoses:  Cough  Nausea  Vomiting  Weakness  COVID-19    Rx / DC Orders ED Discharge Orders    None       Layla Maw 12/28/19 2018    Quintella Reichert, MD 12/28/19 2355

## 2019-12-28 NOTE — ED Notes (Signed)
Per MD verbal order: "It is okay to give medication by mouth"

## 2019-12-28 NOTE — ED Notes (Signed)
Date and time results received: 12/28/19 1957 (use smartphrase ".now" to insert current time)  Test: Lactic Acid  Critical Value: 2.8 Primary RN ,ade awar Name of Provider Notified: Joy, PA  Orders Received? Or Actions Taken?:

## 2019-12-28 NOTE — H&P (Signed)
History and Physical    [name] [name] MWU:[id number] DOB: [birth year] DOA: 12/28/2019  PCP: Richarda Osmond, DO Patient coming from: Home  Chief Complaint: Multiple complaints, Covid positive  HPI: [name] [name] is a 34 y.o. female with medical history significant of insulin-dependent type 2 diabetes, hypertension, obesity (BMI 39.68) presenting to the ED with complaints of chest pain, vomiting, cough, and generalized weakness.  EMS reported temperature 101.5 and CBG 400.  She was given 300 cc normal saline and Zofran in route. Patient was seen in the ED on 8/13 for symptoms consistent with an acute viral illness and tested positive for COVID-19 at that time.  She was discharged with plans to quarantine and monitor symptoms at home.  Patient states her symptoms started 5 days ago.  She is having fevers, chills, body aches, generalized weakness, cough, nausea, and vomiting.  Today she started having substernal chest pain.  Chest pain is worse when she coughs.  Denies shortness of breath, abdominal pain, or diarrhea.  Also reports having urinary frequency and urgency.  She has not been vaccinated against Covid.  Due to vomiting and not being able to eat, she has not taken her home insulin since her symptoms started.  ED Course: Febrile and tachycardic. Oxygen saturation 86% on room air.  Labs showing no leukocytosis (absolute lymphocyte count low).  Hemoglobin 11.8, at baseline.  Platelet count normal.  Corrected sodium 137, potassium 4.0, chloride 102, bicarb 16, BUN 18, creatinine 1.0, glucose 365, anion gap 15, and LFTs normal.  Lactic acid normal.  Beta hCG negative.  UA with evidence of glucosuria, significant ketones, and signs of infection (large amount of leukocytes, greater than 50 WBCs, and many bacteria).  Urine culture pending.  UDS negative.  VBG with pH 7.33.  Chest x-ray personally reviewed showing extensive multifocal bilateral airspace opacities, new as compared to prior x-ray  from 12/23/2019.  Patient was given ibuprofen, NovoLog 7 units, Phenergan, ceftriaxone, Tylenol, and 1 L LR bolus.  Review of Systems:  All systems reviewed and apart from history of presenting illness, are negative.  Past Medical History:  Diagnosis Date  . Abnormal Pap smear   . Diabetes mellitus    type II  . GBS (group B streptococcus) UTI complicating pregnancy   . Herpes simplex without mention of complication   . Hypertension   . Irregular menses   . Obesity     Past Surgical History:  Procedure Laterality Date  . CESAREAN SECTION  01/30/2011   Procedure: CESAREAN SECTION;  Surgeon: [name], MD;  Location: [facility];  Service: Gynecology;  Laterality: N/A;  . CESAREAN SECTION N/A 10/22/2014   Procedure: CESAREAN SECTION;  Surgeon: [name], MD;  Location: [facility];  Service: Obstetrics;  Laterality: N/A;  . TUBAL LIGATION    . WISDOM TOOTH EXTRACTION       reports that she has quit smoking. She has never used smokeless tobacco. She reports current alcohol use. She reports that she does not use drugs.  Allergies  Allergen Reactions  . Metformin And Related Nausea And Vomiting    Unable to tolerate 553m XR daily    Family History  Problem Relation Age of Onset  . Diabetes Paternal Grandmother   . Hypertension Paternal Grandmother   . Diabetes Maternal Grandmother   . Diabetes Father   . Cancer Paternal Uncle     Prior to Admission medications   Medication Sig Start Date End Date Taking? Authorizing Provider  glucose  blood (FREESTYLE TEST STRIPS) test strip Check glucose 1 time per day and write result in logbook Patient taking differently: 1 each by Other route See admin instructions. Check glucose 1 time per day and write result in logbook 12/28/18  Yes Sherene Sires, DO  glucose monitoring kit (FREESTYLE) monitoring kit 1 each by Does not apply route as needed for other. 12/28/18  Yes Sherene Sires, DO  Insulin Glargine (LANTUS) 100 UNIT/ML Solostar Pen  Inject 10-30 Units into the skin every morning. Patient taking differently: Inject 10 Units into the skin in the morning.  01/06/19  Yes Hensel, Jamal Collin, MD  Lancets (FREESTYLE) lancets Check glucose 1 time per day and write result in logbook Patient taking differently: 1 each by Other route See admin instructions. Check glucose 1 time per day and write result in logbook 12/28/18  Yes Bland, Scott, DO  ondansetron (ZOFRAN ODT) 4 MG disintegrating tablet Take 1 tablet (4 mg total) by mouth every 8 (eight) hours as needed for nausea or vomiting. 12/25/19  Yes Joy, Shawn C, PA-C  acyclovir (ZOVIRAX) 400 MG tablet Take 1 tablet (400 mg total) by mouth 4 (four) times daily. Patient not taking: Reported on 12/25/2019 01/02/19   Margarita Mail, PA-C  famotidine (PEPCID) 20 MG tablet Take 1 tablet (20 mg total) by mouth 2 (two) times daily. Patient not taking: Reported on 12/25/2019 03/10/19   Recardo Evangelist, PA-C  insulin degludec (TRESIBA FLEXTOUCH) 100 UNIT/ML SOPN FlexTouch Pen Inject 0.1 mLs (10 Units total) into the skin daily. Patient not taking: Reported on 12/25/2019 05/26/19   Zenia Resides, MD  naproxen (NAPROSYN) 500 MG tablet Take 1 tablet (500 mg total) by mouth 2 (two) times daily. Patient not taking: Reported on 12/25/2019 10/17/19   Veryl Speak, MD  Semaglutide,0.25 or 0.5MG/DOS, (OZEMPIC, 0.25 OR 0.5 MG/DOSE,) 2 MG/1.5ML SOPN Inject 0.25 mg into the skin once a week. Patient not taking: Reported on 12/25/2019 05/26/19   Zenia Resides, MD    Physical Exam: Vitals:   12/28/19 1316 12/28/19 1651 12/28/19 1655 12/28/19 1657  BP: 124/76  120/81   Pulse: (!) 124   (!) 114  Resp: 18     Temp: (!) 101 F (38.3 C) (!) 102.2 F (39 C)    TempSrc: Oral Oral    SpO2: 94%   92%    Physical Exam Constitutional:      Appearance: She is ill-appearing.     Comments: Appears very lethargic  HENT:     Head: Normocephalic and atraumatic.  Eyes:     Extraocular Movements: Extraocular  movements intact.     Conjunctiva/sclera: Conjunctivae normal.  Cardiovascular:     Rate and Rhythm: Regular rhythm. Tachycardia present.     Pulses: Normal pulses.     Comments: Slightly tachycardic Pulmonary:     Effort: Pulmonary effort is normal.     Breath sounds: Rales present. No wheezing.     Comments: Bibasilar rales Abdominal:     General: Bowel sounds are normal. There is no distension.     Palpations: Abdomen is soft.     Tenderness: There is no abdominal tenderness. There is no guarding or rebound.  Musculoskeletal:        General: No swelling or tenderness.     Cervical back: Normal range of motion and neck supple.  Skin:    General: Skin is warm and dry.  Neurological:     General: No focal deficit present.     Mental  Status: She is alert and oriented to person, place, and time.     Labs on Admission: I have personally reviewed following labs and imaging studies  CBC: Recent Labs  Lab 12/25/19 0754 12/28/19 0939 12/28/19 1902  WBC 4.9 6.5  --   NEUTROABS  --  5.9  --   HGB 11.8* 11.8* 12.2  HCT 39.7 39.8 36.0  MCV 75.0* 74.8*  --   PLT 368 271  --    Basic Metabolic Panel: Recent Labs  Lab 12/25/19 0754 12/28/19 0939 12/28/19 1902  NA 132* 133* 133*  K 3.9 4.0 5.7*  CL 101 102  --   CO2 19* 16*  --   GLUCOSE 204* 365*  --   BUN 8 18  --   CREATININE 0.72 1.03*  --   CALCIUM 9.2 8.5*  --    GFR: Estimated Creatinine Clearance: 82.7 mL/min (A) (by C-G formula based on SCr of 1.03 mg/dL (H)). Liver Function Tests: Recent Labs  Lab 12/25/19 0754 12/28/19 0939  AST 21 25  ALT 20 20  ALKPHOS 92 80  BILITOT 0.4 0.5  PROT 8.0 7.5  ALBUMIN 3.7 3.2*   Recent Labs  Lab 12/25/19 0754  LIPASE 23   No results for input(s): AMMONIA in the last 168 hours. Coagulation Profile: No results for input(s): INR, PROTIME in the last 168 hours. Cardiac Enzymes: No results for input(s): CKTOTAL, CKMB, CKMBINDEX, TROPONINI in the last 168 hours. BNP  (last 3 results) No results for input(s): PROBNP in the last 8760 hours. HbA1C: No results for input(s): HGBA1C in the last 72 hours. CBG: Recent Labs  Lab 12/28/19 1739  GLUCAP 313*   Lipid Profile: No results for input(s): CHOL, HDL, LDLCALC, TRIG, CHOLHDL, LDLDIRECT in the last 72 hours. Thyroid Function Tests: No results for input(s): TSH, T4TOTAL, FREET4, T3FREE, THYROIDAB in the last 72 hours. Anemia Panel: No results for input(s): VITAMINB12, FOLATE, FERRITIN, TIBC, IRON, RETICCTPCT in the last 72 hours. Urine analysis:    Component Value Date/Time   COLORURINE YELLOW 12/28/2019 1644   APPEARANCEUR HAZY (A) 12/28/2019 1644   LABSPEC 1.013 12/28/2019 1644   PHURINE 5.0 12/28/2019 1644   GLUCOSEU >=500 (A) 12/28/2019 1644   HGBUR NEGATIVE 12/28/2019 1644   HGBUR negative 11/23/2007 1349   BILIRUBINUR NEGATIVE 12/28/2019 1644   BILIRUBINUR NEG 05/16/2016 1055   KETONESUR 80 (A) 12/28/2019 1644   PROTEINUR 100 (A) 12/28/2019 1644   UROBILINOGEN 0.2 05/16/2016 1055   UROBILINOGEN 1.0 10/19/2014 0910   NITRITE NEGATIVE 12/28/2019 1644   LEUKOCYTESUR LARGE (A) 12/28/2019 1644    Radiological Exams on Admission: DG Chest 2 View  Result Date: 12/28/2019 CLINICAL DATA:  COVID.  Cough.  Weakness.  Nausea.  Vomiting. EXAM: CHEST - 2 VIEW COMPARISON:  Prior chest radiographs 12/23/2019 and earlier. FINDINGS: Shallow inspiration radiograph. Heart size within normal limits. New from the prior examination of 12/23/2019, there are fairly extensive multifocal bilateral airspace opacities. No evidence of pleural effusion or pneumothorax. No acute bony abnormality identified. IMPRESSION: Fairly extensive multifocal bilateral airspace opacities, new as compared to the prior examination of 12/23/2019. Findings likely reflect atypical/viral pneumonia given the provided history of COVID positivity. Electronically Signed   By: Kellie Simmering DO   On: 12/28/2019 10:14    EKG: Independently  reviewed.  Sinus tachycardia, no acute ischemic changes.  Assessment/Plan Principal Problem:   Pneumonia due to COVID-19 virus Active Problems:   Acute hypoxemic respiratory failure due to COVID-19 [facility])  UTI (urinary tract infection)   Sepsis ([place])   DKA (diabetic ketoacidoses) ([place])   Acute hypoxemic respiratory failure secondary to COVID-19 viral multifocal pneumonia: Tested positive for COVID-19 on 12/23/2019.  Oxygen saturation 86% on room air, currently satting above 92% on 2 L supplemental oxygen via nasal cannula.  No tachypnea or increased work of breathing. Febrile and tachycardic. Labs showing no leukocytosis (absolute lymphocyte count low).  Lactic acid normal. Chest x-ray personally reviewed showing extensive multifocal bilateral airspace opacities, new as compared to prior x-ray from 12/23/2019. -Remdesivir dosing per pharmacy -IV Solu-Medrol 0.5 mg/kg every 12 hours -Baricitinib 4 mg daily.  No known history of active TB (confirmed with the patient). -Vitamin C, zinc, vitamin D -Antitussives as needed -Tylenol as needed -Check inflammatory markers including ferritin, fibrinogen, D-dimer, CRP, LDH -Check procalcitonin level -Daily CBC with differential, CMP, CRP, D-dimer -Airborne and contact precautions -Continuous pulse ox -Supplemental oxygen as needed to keep oxygen saturation above 90% -Incentive spirometry, flutter valve -Order blood culture x2  UTI: UA with large amount of leukocytes, greater than 50 WBCs, and many bacteria.  Patient is endorsing urinary frequency and urgency. -Ceftriaxone.  Urine culture pending.  Sepsis: Febrile and tachycardic.  Source of infection - COVID-19 viral multifocal pneumonia and UTI. -IV fluid resuscitation, continue management of pneumonia and UTI as mentioned above.  Blood culture x2 ordered.  Hyperglycemia/ early DKA: Likely precipitated by acute viral illness/infection and patient not being able to take her insulin due to poor  oral intake/intractable nausea and vomiting.  Blood glucose 365.  Anion gap borderline elevated at 15 and bicarb low at 16.  UA with significant ketones.  VBG with pH 7.33. -Keep n.p.o. Start insulin infusion due to concern for early DKA and the fact that patient will be on high-dose steroids for Covid pneumonia which will make her hyperglycemic.  Received 1 L fluid bolus in the ED and 300 cc by EMS.  Continue IV fluid hydration with normal saline at 150 cc/h, when CBG less than 250 switch to D5-1/2 normal saline at 100 cc/h.  CBG checks every 1 hour.  Monitor BMP every 4 hours.  Check beta hydroxybutyric acid level.  Check A1c.  When DKA resolves, initiate diet and subcutaneous insulin, continue IV insulin for an additional 2 hours.  Consult diabetes coordinator.  Intractable nausea and vomiting: Likely multifactorial due to COVID-19 viral gastroenteritis and early DKA.  Beta hCG negative.  LFTs normal.  Abdominal exam benign. -Symptomatic management: IV fluid hydration, antiemetic as needed  AKI: Likely prerenal from dehydration.  Creatinine currently 1.0, baseline 0.5-0.6.  -IV fluid hydration.  Continue to monitor renal function and urine output.  Avoid nephrotoxic agents.  Chest pain: EKG without acute ischemic changes.  Currently chest pain-free. -Cardiac monitoring, check high-sensitivity troponin level  Hypertension: Blood pressure soft. -Hold antihypertensives at this time  DVT prophylaxis: Lovenox Code Status: Full code Family Communication: No family available at this time. Disposition Plan: Status is: Inpatient  Remains inpatient appropriate because:IV treatments appropriate due to intensity of illness or inability to take PO and Inpatient level of care appropriate due to severity of illness   Dispo: The patient is from: Home              Anticipated d/c is to: Home              Anticipated d/c date is: 3 days              Patient currently is not medically  stable to d/c.  The  medical decision making on this patient was of high complexity and the patient is at high risk for clinical deterioration, therefore this is a level 3 visit.  Shela Leff MD [facility]  If 7PM-7AM, please contact night-coverage [email or web address]  12/28/2019, 9:09 PM

## 2019-12-28 NOTE — ED Triage Notes (Signed)
Pt brought in by [facility]. Covid positive diagnosed and symptoms started on Friday.  Pt has not been vaccinated.  Pt states that symptoms are getting worse, she is vomiting, weak, cough, and chest is hurting her.  Pt was given Zofran 4mg  IV en route.  Temp 101.5 and she is not sure she can keep anything down.  CBG 400 for EMS and she was given 300 NS en route to ED.

## 2019-12-28 NOTE — ED Notes (Signed)
MD paged for NPO clarification.

## 2019-12-29 DIAGNOSIS — N3000 Acute cystitis without hematuria: Secondary | ICD-10-CM

## 2019-12-29 DIAGNOSIS — E1010 Type 1 diabetes mellitus with ketoacidosis without coma: Secondary | ICD-10-CM

## 2019-12-29 LAB — COMPREHENSIVE METABOLIC PANEL
ALT: 22 U/L (ref 0–44)
AST: 31 U/L (ref 15–41)
Albumin: 2.6 g/dL — ABNORMAL LOW (ref 3.5–5.0)
Alkaline Phosphatase: 66 U/L (ref 38–126)
Anion gap: 9 (ref 5–15)
BUN: 15 mg/dL (ref 6–20)
CO2: 20 mmol/L — ABNORMAL LOW (ref 22–32)
Calcium: 8 mg/dL — ABNORMAL LOW (ref 8.9–10.3)
Chloride: 107 mmol/L (ref 98–111)
Creatinine, Ser: 0.85 mg/dL (ref 0.44–1.00)
GFR calc Af Amer: >60 mL/min (ref >60)
GFR calc non Af Amer: >60 mL/min (ref >60)
Glucose, Bld: 171 mg/dL — ABNORMAL HIGH (ref 70–99)
Potassium: 3.9 mmol/L (ref 3.5–5.1)
Sodium: 136 mmol/L (ref 135–145)
Total Bilirubin: 0.1 mg/dL — ABNORMAL LOW (ref 0.3–1.2)
Total Protein: 6.3 g/dL — ABNORMAL LOW (ref 6.5–8.1)

## 2019-12-29 LAB — CBG MONITORING, ED
Glucose-Capillary: 125 mg/dL — ABNORMAL HIGH (ref 70–99)
Glucose-Capillary: 131 mg/dL — ABNORMAL HIGH (ref 70–99)
Glucose-Capillary: 154 mg/dL — ABNORMAL HIGH (ref 70–99)
Glucose-Capillary: 154 mg/dL — ABNORMAL HIGH (ref 70–99)
Glucose-Capillary: 163 mg/dL — ABNORMAL HIGH (ref 70–99)
Glucose-Capillary: 167 mg/dL — ABNORMAL HIGH (ref 70–99)
Glucose-Capillary: 168 mg/dL — ABNORMAL HIGH (ref 70–99)
Glucose-Capillary: 181 mg/dL — ABNORMAL HIGH (ref 70–99)

## 2019-12-29 LAB — BASIC METABOLIC PANEL
Anion gap: 13 (ref 5–15)
BUN: 18 mg/dL (ref 6–20)
CO2: 19 mmol/L — ABNORMAL LOW (ref 22–32)
Calcium: 8 mg/dL — ABNORMAL LOW (ref 8.9–10.3)
Chloride: 103 mmol/L (ref 98–111)
Creatinine, Ser: 1.1 mg/dL — ABNORMAL HIGH (ref 0.44–1.00)
GFR calc Af Amer: >60 mL/min (ref >60)
GFR calc non Af Amer: >60 mL/min (ref >60)
Glucose, Bld: 233 mg/dL — ABNORMAL HIGH (ref 70–99)
Potassium: 3.8 mmol/L (ref 3.5–5.1)
Sodium: 135 mmol/L (ref 135–145)

## 2019-12-29 LAB — TROPONIN I (HIGH SENSITIVITY): Troponin I (High Sensitivity): 4 ng/L (ref <18)

## 2019-12-29 LAB — CBC WITH DIFFERENTIAL/PLATELET
Abs Immature Granulocytes: 0.01 10*3/uL (ref 0.00–0.07)
Basophils Absolute: 0 10*3/uL (ref 0.0–0.1)
Basophils Relative: 0 %
Eosinophils Absolute: 0 10*3/uL (ref 0.0–0.5)
Eosinophils Relative: 0 %
HCT: 33.4 % — ABNORMAL LOW (ref 36.0–46.0)
Hemoglobin: 9.9 g/dL — ABNORMAL LOW (ref 12.0–15.0)
Immature Granulocytes: 0 %
Lymphocytes Relative: 14 %
Lymphs Abs: 0.5 10*3/uL — ABNORMAL LOW (ref 0.7–4.0)
MCH: 22.1 pg — ABNORMAL LOW (ref 26.0–34.0)
MCHC: 29.6 g/dL — ABNORMAL LOW (ref 30.0–36.0)
MCV: 74.7 fL — ABNORMAL LOW (ref 80.0–100.0)
Monocytes Absolute: 0.1 10*3/uL (ref 0.1–1.0)
Monocytes Relative: 3 %
Neutro Abs: 2.8 10*3/uL (ref 1.7–7.7)
Neutrophils Relative %: 83 %
Platelets: 234 10*3/uL (ref 150–400)
RBC: 4.47 MIL/uL (ref 3.87–5.11)
RDW: 16.9 % — ABNORMAL HIGH (ref 11.5–15.5)
WBC: 3.3 10*3/uL — ABNORMAL LOW (ref 4.0–10.5)
nRBC: 0 % (ref 0.0–0.2)

## 2019-12-29 LAB — LACTATE DEHYDROGENASE: LDH: 276 U/L — ABNORMAL HIGH (ref 98–192)

## 2019-12-29 LAB — LACTIC ACID, PLASMA: Lactic Acid, Venous: 1.9 mmol/L (ref 0.5–1.9)

## 2019-12-29 LAB — HIV ANTIBODY (ROUTINE TESTING W REFLEX): HIV Screen 4th Generation wRfx: NONREACTIVE

## 2019-12-29 LAB — D-DIMER, QUANTITATIVE: D-Dimer, Quant: 0.8 ug/mL-FEU — ABNORMAL HIGH (ref 0.00–0.50)

## 2019-12-29 LAB — C-REACTIVE PROTEIN: CRP: 10.6 mg/dL — ABNORMAL HIGH (ref <1.0)

## 2019-12-29 LAB — PROCALCITONIN: Procalcitonin: 0.18 ng/mL

## 2019-12-29 MED ORDER — SODIUM CHLORIDE 0.9 % IV BOLUS: 500.0000 mL | Freq: Once | INTRAVENOUS | Status: DC

## 2019-12-29 MED ORDER — INSULIN ASPART 100 UNIT/ML ~~LOC~~ SOLN
0.0000 [IU] | Freq: Three times a day (TID) | SUBCUTANEOUS | Status: DC
  Administered 2019-12-29: 4 [IU] via SUBCUTANEOUS
  Administered 2019-12-29: 3 [IU] via SUBCUTANEOUS
  Administered 2019-12-30: 11 [IU] via SUBCUTANEOUS
  Administered 2019-12-30: 15 [IU] via SUBCUTANEOUS
  Administered 2019-12-30: 11 [IU] via SUBCUTANEOUS
  Administered 2019-12-31: 15 [IU] via SUBCUTANEOUS
  Administered 2019-12-31: 14 [IU] via SUBCUTANEOUS
  Administered 2019-12-31 – 2020-01-01 (×2): 15 [IU] via SUBCUTANEOUS

## 2019-12-29 MED ORDER — ALBUTEROL SULFATE HFA 108 (90 BASE) MCG/ACT IN AERS
2.0000 | INHALATION_SPRAY | Freq: Four times a day (QID) | RESPIRATORY_TRACT | Status: DC
  Administered 2019-12-29 – 2020-01-01 (×14): 2 via RESPIRATORY_TRACT
  Filled 2019-12-29 (×4): qty 6.7

## 2019-12-29 MED ORDER — MENTHOL 3 MG MT LOZG
1.0000 | LOZENGE | OROMUCOSAL | Status: DC | PRN
  Administered 2019-12-31: 3 mg via ORAL
  Filled 2019-12-29 (×3): qty 9

## 2019-12-29 MED ORDER — INSULIN GLARGINE 100 UNIT/ML ~~LOC~~ SOLN
18.0000 [IU] | Freq: Every day | SUBCUTANEOUS | Status: DC
  Administered 2019-12-29 – 2019-12-31 (×3): 18 [IU] via SUBCUTANEOUS
  Filled 2019-12-29 (×4): qty 0.18

## 2019-12-29 MED ORDER — BENZONATATE 100 MG PO CAPS
200.0000 mg | ORAL_CAPSULE | Freq: Three times a day (TID) | ORAL | Status: DC
  Administered 2019-12-29 – 2020-01-01 (×10): 200 mg via ORAL
  Filled 2019-12-29 (×10): qty 2

## 2019-12-29 MED ORDER — ALUM & MAG HYDROXIDE-SIMETH 200-200-20 MG/5ML PO SUSP
30.0000 mL | Freq: Four times a day (QID) | ORAL | Status: DC | PRN
  Filled 2019-12-29: qty 30

## 2019-12-29 MED ORDER — SODIUM CHLORIDE 0.9 % IV BOLUS
1000.0000 mL | Freq: Once | INTRAVENOUS | Status: AC
  Administered 2019-12-29: 1000 mL via INTRAVENOUS

## 2019-12-29 MED ORDER — PANTOPRAZOLE SODIUM 40 MG PO TBEC
40.0000 mg | DELAYED_RELEASE_TABLET | Freq: Every day | ORAL | Status: DC
  Administered 2019-12-29 – 2020-01-01 (×4): 40 mg via ORAL
  Filled 2019-12-29 (×4): qty 1

## 2019-12-29 MED ORDER — INSULIN ASPART 100 UNIT/ML ~~LOC~~ SOLN
0.0000 [IU] | Freq: Every day | SUBCUTANEOUS | Status: DC
  Administered 2019-12-29: 2 [IU] via SUBCUTANEOUS
  Administered 2019-12-30: 4 [IU] via SUBCUTANEOUS
  Administered 2019-12-31: 5 [IU] via SUBCUTANEOUS

## 2019-12-29 NOTE — ED Notes (Signed)
CBG 125 °

## 2019-12-29 NOTE — ED Notes (Signed)
Paged IM to [facility]

## 2019-12-29 NOTE — Progress Notes (Signed)
PROGRESS NOTE                                                                                                                                                                                                             Patient Demographics:    [name] [name], is a 34 y.o. female, DOB - 03-24-1986, LOV:[id number]  Outpatient Primary MD for the patient is Leeroy Bock, DO   Admit date - 12/28/2019   LOS - 1  Chief Complaint  Patient presents with  . Chest Pain  . Emesis  . Hyperglycemia       Brief Narrative: Patient is a 34 y.o. female with PMHx of DM-1, HTN, morbid obesity-who was diagnosed with COVID-19 on 8/13-presented to the hospital with cough, worsening shortness of breath.  She was found to have acute hypoxic respiratory failure requiring oxygen supplementation in the setting of COVID-19 PNA.  COVID-19 vaccinated status: Unvaccinated  Significant Events: 8/18>> Admit to [facility] for hypoxia from COVID-19  Significant studies: 8/18>>Chest x-ray: Extensive multifocal bilateral airspace opacities  COVID-19 medications: Steroids:8/18>> Remdesivir: 8/18>> Baricitinib: 8/18  Antibiotics: Rocephin: 8/18>>  Microbiology data: 8/18 >>blood culture: Negative 8/18>> urine culture: Pending  Procedures: None  Consults: None  DVT prophylaxis: enoxaparin (LOVENOX) injection 40 mg Start: 12/28/19 2200    Subjective:    [name] [name] today continues to have persistent coughing spells-stable on just 1-2 L of oxygen.  Feels her shortness of breath is somewhat better.   Assessment  & Plan :   Sepsis and acute Hypoxic Resp Failure due to Covid 19 Viral pneumonia: Overall stable-coughing spells are her major issue.  On just 1-2 L of oxygen.  Appears comfortable.  Add scheduled Tessalon-continue as needed Tussionex.  Encourage incentive spirometry/flutter valve.  Continue steroids/remdesivir/baricitinib.  Follow  clinical course and inflammatory markers.  Attempt to titrate down oxygen.  Fever: afebrile O2 requirements:  SpO2: 92 %   COVID-19 Labs: Recent Labs    12/28/19 2249 12/29/19 0356  DDIMER 0.92* 0.80*  FERRITIN 258  --   LDH 276*  --   CRP 11.5* 10.6*    No results found for: BNP  Recent Labs  Lab 12/28/19 2249  PROCALCITON 0.18    Lab Results  Component Value Date   SARSCOV2NAA POSITIVE (A) 12/23/2019   SARSCOV2NAA NOT DETECTED 02/21/2019  Prone/Incentive Spirometry: encouraged  incentive spirometry use 3-4/hour.  Early DKA and type I DM (A1c 11.3 on 8/18): DKA resolved-stop insulin infusion-transition to Lantus/NovoLog/SSI.  Recent Labs    12/29/19 0519 12/29/19 0623 12/29/19 0846  GLUCAP 154* 163* 154*    Cystitis/UTI: Await urine culture-plan Rocephin for treatment days total.  AKI: Hemodynamically mediated-resolved.  HTN: BP stable-no need for antihypertensives-also does not appear to be on any antihypertensives in the outpatient setting.  Follow  Morbid obesity: Estimated body mass index is 39.68 kg/m as calculated from the following:   Height as of 12/25/19: 5\' 1"  (1.549 m).   Weight as of 12/25/19: 95.3 kg.    ABG:    Component Value Date/Time   HCO3 18.7 (L) 12/28/2019 1902   TCO2 20 (L) 12/28/2019 1902   ACIDBASEDEF 7.0 (H) 12/28/2019 1902   O2SAT 83.0 12/28/2019 1902    Vent Settings: N/A  Condition - Stable  Family Communication  :  Father [phone]) updated over the phone  Code Status :  Full Code  Diet :  Diet Order            Diet heart healthy/carb modified Room service appropriate? Yes; Fluid consistency: Thin  Diet effective now                  Disposition Plan  :   Status is: Inpatient  Remains inpatient appropriate because:Inpatient level of care appropriate due to severity of illness   Dispo: The patient is from: Home              Anticipated d/c is to: Home              Anticipated d/c date  is: 3 days              Patient currently is not medically stable to d/c.   Barriers to discharge: Hypoxia requiring O2 supplementation/complete 5 days of IV Remdesivir  Antimicorbials  :    Anti-infectives (From admission, onward)   Start     Dose/Rate Route Frequency Ordered Stop   12/29/19 2200  cefTRIAXone (ROCEPHIN) 1 g in sodium chloride 0.9 % 100 mL IVPB        1 g 200 mL/hr over 30 Minutes Intravenous Every 24 hours 12/28/19 2029     12/29/19 1000  remdesivir 100 mg in sodium chloride 0.9 % 100 mL IVPB       "Followed by" Linked Group Details   100 mg 200 mL/hr over 30 Minutes Intravenous Daily 12/28/19 2029 01/02/20 0959   12/28/19 2100  remdesivir 200 mg in sodium chloride 0.9% 250 mL IVPB       "Followed by" Linked Group Details   200 mg 580 mL/hr over 30 Minutes Intravenous Once 12/28/19 2029 12/28/19 2247   12/28/19 1945  cefTRIAXone (ROCEPHIN) 1 g in sodium chloride 0.9 % 100 mL IVPB        1 g 200 mL/hr over 30 Minutes Intravenous  Once 12/28/19 1936 12/28/19 2200      Inpatient Medications  Scheduled Meds: . albuterol  2 puff Inhalation QID  . vitamin C  500 mg Oral Daily  . baricitinib  4 mg Oral Daily  . benzonatate  200 mg Oral TID  . cholecalciferol  1,000 Units Oral Daily  . enoxaparin (LOVENOX) injection  40 mg Subcutaneous Q24H  . insulin aspart  0-20 Units Subcutaneous TID WC  . insulin aspart  0-5 Units Subcutaneous QHS  . insulin glargine  18 Units Subcutaneous Daily  . methylPREDNISolone (SOLU-MEDROL) injection  0.5 mg/kg Intravenous Q12H  . zinc sulfate  220 mg Oral Daily   Continuous Infusions: . sodium chloride 150 mL/hr at 12/28/19 2202  . cefTRIAXone (ROCEPHIN)  IV    . dextrose 5 % and 0.45% NaCl 100 mL/hr at 12/28/19 2235  . insulin 5 Units/hr (12/29/19 0847)  . remdesivir 100 mg in NS 100 mL     PRN Meds:.chlorpheniramine-HYDROcodone, dextrose, guaiFENesin-dextromethorphan, menthol-cetylpyridinium, promethazine   Time Spent in  minutes  25  See all Orders from today for further details   Jeoffrey Massed M.D on 12/29/2019 at 11:21 AM  To page go to [email or web address] - use universal password  [facility] -  Office  [phone]    Objective:   Vitals:   12/29/19 0730 12/29/19 0752 12/29/19 0800 12/29/19 0830  BP: 115/68 115/68 112/73 121/76  Pulse: 91 90 95 100  Resp: 18 18    Temp:      TempSrc:      SpO2: 92% 95% 94% 92%    Wt Readings from Last 3 Encounters:  12/25/19 95.3 kg  10/31/19 106 kg  10/17/19 97.5 kg     Intake/Output Summary (Last 24 hours) at 12/29/2019 1121 Last data filed at 12/29/2019 0844 Gross per 24 hour  Intake 1000 ml  Output 800 ml  Net 200 ml     Physical Exam Gen Exam:Alert awake-not in any distress HEENT:atraumatic, normocephalic Chest: B/L clear to auscultation anteriorly CVS:S1S2 regular Abdomen:soft non tender, non distended Extremities:no edema Neurology: Non focal Skin: no rash   Data Review:    CBC Recent Labs  Lab 12/25/19 0754 12/28/19 0939 12/28/19 1902 12/29/19 0356  WBC 4.9 6.5  --  3.3*  HGB 11.8* 11.8* 12.2 9.9*  HCT 39.7 39.8 36.0 33.4*  PLT 368 271  --  234  MCV 75.0* 74.8*  --  74.7*  MCH 22.3* 22.2*  --  22.1*  MCHC 29.7* 29.6*  --  29.6*  RDW 16.5* 16.8*  --  16.9*  LYMPHSABS  --  0.5*  --  0.5*  MONOABS  --  0.1  --  0.1  EOSABS  --  0.1  --  0.0  BASOSABS  --  0.0  --  0.0    Chemistries  Recent Labs  Lab 12/25/19 0754 12/28/19 0939 12/28/19 1902 12/28/19 2249 12/29/19 0356  NA 132* 133* 133* 135 136  K 3.9 4.0 5.7* 3.8 3.9  CL 101 102  --  103 107  CO2 19* 16*  --  19* 20*  GLUCOSE 204* 365*  --  233* 171*  BUN 8 18  --  18 15  CREATININE 0.72 1.03*  --  1.10* 0.85  CALCIUM 9.2 8.5*  --  8.0* 8.0*  AST 21 25  --   --  31  ALT 20 20  --   --  22  ALKPHOS 92 80  --   --  66  BILITOT 0.4 0.5  --   --  0.1*    ------------------------------------------------------------------------------------------------------------------ No results for input(s): CHOL, HDL, LDLCALC, TRIG, CHOLHDL, LDLDIRECT in the last 72 hours.  Lab Results  Component Value Date   HGBA1C 11.3 (H) 12/28/2019   ------------------------------------------------------------------------------------------------------------------ No results for input(s): TSH, T4TOTAL, T3FREE, THYROIDAB in the last 72 hours.  Invalid input(s): FREET3 ------------------------------------------------------------------------------------------------------------------ Recent Labs    12/28/19 2249  FERRITIN 258    Coagulation profile No results for input(s): INR, PROTIME in the last 168  hours.  Recent Labs    12/28/19 2249 12/29/19 0356  DDIMER 0.92* 0.80*    Cardiac Enzymes No results for input(s): CKMB, TROPONINI, MYOGLOBIN in the last 168 hours.  Invalid input(s): CK ------------------------------------------------------------------------------------------------------------------ No results found for: BNP  Micro Results Recent Results (from the past 240 hour(s))  SARS Coronavirus 2 by RT PCR (hospital order, performed in [facility] hospital lab) Nasopharyngeal Nasopharyngeal Swab     Status: Abnormal   Collection Time: 12/23/19 11:45 AM   Specimen: Nasopharyngeal Swab  Result Value Ref Range Status   SARS Coronavirus 2 POSITIVE (A) NEGATIVE Final    Comment: RESULT CALLED TO, READ BACK BY AND VERIFIED WITH: S.LEONARD RN AT 1257 ON 12/23/19 BY S.VANHOORNE Performed at [facility], [address] [place], [place] [address]   Culture, blood (Routine X 2) w Reflex to ID Panel     Status: None (Preliminary result)   Collection Time: 12/28/19 10:00 PM   Specimen: BLOOD  Result Value Ref Range Status   Specimen Description BLOOD SITE NOT SPECIFIED  Final   Special Requests   Final    BOTTLES DRAWN AEROBIC AND  ANAEROBIC Blood Culture results may not be optimal due to an inadequate volume of blood received in culture bottles   Culture   Final    NO GROWTH < 12 HOURS Performed at [facility], [address] [place], [place] [address]    Report Status PENDING  Incomplete    Radiology Reports DG Chest 2 View  Result Date: 12/28/2019 CLINICAL DATA:  COVID.  Cough.  Weakness.  Nausea.  Vomiting. EXAM: CHEST - 2 VIEW COMPARISON:  Prior chest radiographs 12/23/2019 and earlier. FINDINGS: Shallow inspiration radiograph. Heart size within normal limits. New from the prior examination of 12/23/2019, there are fairly extensive multifocal bilateral airspace opacities. No evidence of pleural effusion or pneumothorax. No acute bony abnormality identified. IMPRESSION: Fairly extensive multifocal bilateral airspace opacities, new as compared to the prior examination of 12/23/2019. Findings likely reflect atypical/viral pneumonia given the provided history of COVID positivity. Electronically Signed   By: Jackey LogeKyle  Golden DO   On: 12/28/2019 10:14   DG Chest Port 1 View  Result Date: 12/23/2019 CLINICAL DATA:  Cough EXAM: PORTABLE CHEST 1 VIEW COMPARISON:  October 25, 2014 FINDINGS: The cardiomediastinal silhouette is normal in contour when accounting for technique. No pleural effusion. No pneumothorax. No acute pleuroparenchymal abnormality. Visualized abdomen is unremarkable. No acute osseous abnormality noted. IMPRESSION: No acute cardiopulmonary abnormality. Electronically Signed   By: Meda KlinefelterStephanie  Peacock MD   On: 12/23/2019 12:04

## 2019-12-29 NOTE — ED Notes (Signed)
Computer is non functioning in this room. Error msg given to NS to report to IT.

## 2019-12-29 NOTE — ED Notes (Signed)
CBG 240 

## 2019-12-30 ENCOUNTER — Inpatient Hospital Stay (HOSPITAL_COMMUNITY): Payer: Medicaid Other

## 2019-12-30 DIAGNOSIS — M7989 Other specified soft tissue disorders: Secondary | ICD-10-CM

## 2019-12-30 LAB — CBC WITH DIFFERENTIAL/PLATELET
Abs Immature Granulocytes: 0 10*3/uL (ref 0.00–0.07)
Basophils Absolute: 0 10*3/uL (ref 0.0–0.1)
Basophils Relative: 0 %
Eosinophils Absolute: 0 10*3/uL (ref 0.0–0.5)
Eosinophils Relative: 0 %
HCT: 33 % — ABNORMAL LOW (ref 36.0–46.0)
Hemoglobin: 9.7 g/dL — ABNORMAL LOW (ref 12.0–15.0)
Lymphocytes Relative: 27 %
Lymphs Abs: 0.5 10*3/uL — ABNORMAL LOW (ref 0.7–4.0)
MCH: 22.4 pg — ABNORMAL LOW (ref 26.0–34.0)
MCHC: 29.4 g/dL — ABNORMAL LOW (ref 30.0–36.0)
MCV: 76 fL — ABNORMAL LOW (ref 80.0–100.0)
Monocytes Absolute: 0.1 10*3/uL (ref 0.1–1.0)
Monocytes Relative: 7 %
Neutro Abs: 1.2 10*3/uL — ABNORMAL LOW (ref 1.7–7.7)
Neutrophils Relative %: 66 %
Platelets: 238 10*3/uL (ref 150–400)
RBC: 4.34 MIL/uL (ref 3.87–5.11)
RDW: 17.2 % — ABNORMAL HIGH (ref 11.5–15.5)
WBC: 1.8 10*3/uL — ABNORMAL LOW (ref 4.0–10.5)
nRBC: 0 % (ref 0.0–0.2)
nRBC: 0 /100 WBC

## 2019-12-30 LAB — COMPREHENSIVE METABOLIC PANEL
ALT: 25 U/L (ref 0–44)
AST: 40 U/L (ref 15–41)
Albumin: 2.5 g/dL — ABNORMAL LOW (ref 3.5–5.0)
Alkaline Phosphatase: 62 U/L (ref 38–126)
Anion gap: 10 (ref 5–15)
BUN: 12 mg/dL (ref 6–20)
CO2: 19 mmol/L — ABNORMAL LOW (ref 22–32)
Calcium: 7.9 mg/dL — ABNORMAL LOW (ref 8.9–10.3)
Chloride: 110 mmol/L (ref 98–111)
Creatinine, Ser: 0.74 mg/dL (ref 0.44–1.00)
GFR calc Af Amer: >60 mL/min (ref >60)
GFR calc non Af Amer: >60 mL/min (ref >60)
Glucose, Bld: 345 mg/dL — ABNORMAL HIGH (ref 70–99)
Potassium: 4.4 mmol/L (ref 3.5–5.1)
Sodium: 139 mmol/L (ref 135–145)
Total Bilirubin: 0.2 mg/dL — ABNORMAL LOW (ref 0.3–1.2)
Total Protein: 6.4 g/dL — ABNORMAL LOW (ref 6.5–8.1)

## 2019-12-30 LAB — C-REACTIVE PROTEIN: CRP: 2.5 mg/dL — ABNORMAL HIGH (ref <1.0)

## 2019-12-30 LAB — D-DIMER, QUANTITATIVE: D-Dimer, Quant: 0.86 ug/mL-FEU — ABNORMAL HIGH (ref 0.00–0.50)

## 2019-12-30 LAB — GLUCOSE, CAPILLARY: Glucose-Capillary: 317 mg/dL — ABNORMAL HIGH (ref 70–99)

## 2019-12-30 LAB — URINE CULTURE: Culture: >=100000 — AB

## 2019-12-30 NOTE — ED Notes (Signed)
Tele   Breakfast ordered  

## 2019-12-30 NOTE — Progress Notes (Signed)
Lower extremity venous bilateral study completed.   Please see CV Proc for preliminary results.   [name] [name]  

## 2019-12-30 NOTE — ED Notes (Signed)
Pt sleeping and no distress noted. Will continue to monitor.  

## 2019-12-30 NOTE — ED Notes (Signed)
Pt resting in bed. No complaints at this time. VSS. Will continue to monitor.

## 2019-12-30 NOTE — Progress Notes (Signed)
Inpatient Diabetes Program Recommendations  AACE/ADA: New Consensus Statement on Inpatient Glycemic Control (2015)  Target Ranges:  Prepandial:   less than 140 mg/dL      Peak postprandial:   less than 180 mg/dL (1-2 hours)      Critically ill patients:  140 - 180 mg/dL   Lab Results  Component Value Date   GLUCAP 125 (H) 12/29/2019   HGBA1C 11.3 (H) 12/28/2019    Review of Glycemic Control  Diabetes history:type 2 Outpatient Diabetes medications: Lantus 10 units daily, Ozempic 0.25 mg weekly(not taking) Current orders for Inpatient glycemic control: Lantus 18 units daily, Novolog RESISTANT correction scale TID & HS scale  Inpatient Diabetes Program Recommendations:   Received diabetes coordinator consult. Agree with current insulin orders. Titrate dosages as needed.   Will continue to monitor blood sugars while in the hospital.   Smith Mince RN BSN CDE Diabetes Coordinator Pager: [phone]  8am-5pm

## 2019-12-30 NOTE — Progress Notes (Signed)
PROGRESS NOTE                                                                                                                                                                                                             Patient Demographics:    [name] [name], is a 34 y.o. female, DOB - [birth year], ZOX:[id number]  Outpatient Primary MD for the patient is Leeroy Bocknderson, Chelsey L, DO   Admit date - 12/28/2019   LOS - 2  Chief Complaint  Patient presents with   Chest Pain   Emesis   Hyperglycemia       Brief Narrative: Patient is a 34 y.o. female with PMHx of DM-1, HTN, morbid obesity-who was diagnosed with COVID-19 on 8/13-presented to the hospital with cough, worsening shortness of breath.  She was found to have acute hypoxic respiratory failure requiring oxygen supplementation in the setting of COVID-19 PNA.  COVID-19 vaccinated status: Unvaccinated  Significant Events: 8/18>> [facility] from COVID-19  Significant studies: 8/18>>Chest x-ray: Extensive multifocal bilateral airspace opacities  COVID-19 medications: Steroids:8/18>> Remdesivir: 8/18>> Baricitinib: 8/18  Antibiotics: Rocephin: 8/18>>  Microbiology data: 8/18 >>blood culture: Negative 8/18>> urine culture: E. coli (resistant to ampicillin)  Procedures: None  Consults: None  DVT prophylaxis: enoxaparin (LOVENOX) injection 40 mg Start: 12/28/19 2200    Subjective:   Still continues to have coughing spells-but thinks breathing is somewhat better.  Remains on 1-2 L of oxygen.   Assessment  & Plan :   Sepsis and acute Hypoxic Resp Failure due to Covid 19 Viral pneumonia: Essentially unchanged-stable oxygen requirements at around 2 L-but continues to have coughing spells.  Continue steroids/remdesivir/baricitinib and other supportive care.    Fever: afebrile O2 requirements:  SpO2: 90 % O2 Flow Rate (L/min): 2 L/min    COVID-19 Labs: Recent Labs    12/28/19 2249 12/29/19 0356 12/30/19 0451  DDIMER 0.92* 0.80* 0.86*  FERRITIN 258  --   --   LDH 276*  --   --   CRP 11.5* 10.6* 2.5*    No results found for: BNP  Recent Labs  Lab 12/28/19 2249  PROCALCITON 0.18    Lab Results  Component Value Date   SARSCOV2NAA POSITIVE (A) 12/23/2019   SARSCOV2NAA NOT DETECTED 02/21/2019     Prone/Incentive Spirometry: encouraged  incentive spirometry use 3-4/hour.  Early  DKA and type I DM (A1c 11.3 on 8/18): DKA resolved-required insulin infusion-CBGs now stable with Lantus 18 units daily and SSI.  Follow and adjust.    Recent Labs    12/29/19 0623 12/29/19 0846 12/29/19 1132  GLUCAP 163* 154* 125*    Cystitis/UTI: Await urine culture-plan Rocephin for treatment days total.  Leukopenia: Likely secondary to COVID-19-follow.  AKI: Hemodynamically mediated-resolved.  HTN: BP stable-no need for antihypertensives-also does not appear to be on any antihypertensives in the outpatient setting.  Follow  Morbid obesity: Estimated body mass index is 39.68 kg/m as calculated from the following:   Height as of 12/25/19: 5\' 1"  (1.549 m).   Weight as of 12/25/19: 95.3 kg.    ABG:    Component Value Date/Time   HCO3 18.7 (L) 12/28/2019 1902   TCO2 20 (L) 12/28/2019 1902   ACIDBASEDEF 7.0 (H) 12/28/2019 1902   O2SAT 83.0 12/28/2019 1902    Vent Settings: N/A  Condition - Stable  Family Communication  :  Father [phone]) updated over the phone 8/19  Code Status :  Full Code  Diet :  Diet Order            Diet heart healthy/carb modified Room service appropriate? Yes; Fluid consistency: Thin  Diet effective now                  Disposition Plan  :   Status is: Inpatient  Remains inpatient appropriate because:Inpatient level of care appropriate due to severity of illness   Dispo: The patient is from: Home              Anticipated d/c is to: Home               Anticipated d/c date is: 3 days              Patient currently is not medically stable to d/c.   Barriers to discharge: Hypoxia requiring O2 supplementation/complete 5 days of IV Remdesivir  Antimicorbials  :    Anti-infectives (From admission, onward)   Start     Dose/Rate Route Frequency Ordered Stop   12/29/19 2200  cefTRIAXone (ROCEPHIN) 1 g in sodium chloride 0.9 % 100 mL IVPB        1 g 200 mL/hr over 30 Minutes Intravenous Every 24 hours 12/28/19 2029 01/01/20 2159   12/29/19 1000  remdesivir 100 mg in sodium chloride 0.9 % 100 mL IVPB       "Followed by" Linked Group Details   100 mg 200 mL/hr over 30 Minutes Intravenous Daily 12/28/19 2029 01/02/20 0959   12/28/19 2100  remdesivir 200 mg in sodium chloride 0.9% 250 mL IVPB       "Followed by" Linked Group Details   200 mg 580 mL/hr over 30 Minutes Intravenous Once 12/28/19 2029 12/28/19 2247   12/28/19 1945  cefTRIAXone (ROCEPHIN) 1 g in sodium chloride 0.9 % 100 mL IVPB        1 g 200 mL/hr over 30 Minutes Intravenous  Once 12/28/19 1936 12/28/19 2200      Inpatient Medications  Scheduled Meds:  albuterol  2 puff Inhalation QID   vitamin C  500 mg Oral Daily   baricitinib  4 mg Oral Daily   benzonatate  200 mg Oral TID   cholecalciferol  1,000 Units Oral Daily   enoxaparin (LOVENOX) injection  40 mg Subcutaneous Q24H   insulin aspart  0-20 Units Subcutaneous TID WC   insulin aspart  0-5 Units Subcutaneous QHS   insulin glargine  18 Units Subcutaneous Daily   methylPREDNISolone (SOLU-MEDROL) injection  0.5 mg/kg Intravenous Q12H   pantoprazole  40 mg Oral Daily   zinc sulfate  220 mg Oral Daily   Continuous Infusions:  cefTRIAXone (ROCEPHIN)  IV Stopped (12/30/19 0114)   remdesivir 100 mg in NS 100 mL Stopped (12/29/19 1335)   PRN Meds:.alum & mag hydroxide-simeth, chlorpheniramine-HYDROcodone, dextrose, guaiFENesin-dextromethorphan, menthol-cetylpyridinium, promethazine   Time Spent in minutes   25  See all Orders from today for further details   Jeoffrey Massed M.D on 12/30/2019 at 7:42 AM  To page go to [email or web address] - use universal password  [facility] -  Office  [phone]    Objective:   Vitals:   12/29/19 2300 12/30/19 0005 12/30/19 0305 12/30/19 0600  BP: 125/86 (!) 131/92 120/77 138/79  Pulse: 97  91 86  Resp: (!) 22 (!) 22 20 (!) 30  Temp:      TempSrc:      SpO2: 92% 91% 92% 90%    Wt Readings from Last 3 Encounters:  12/25/19 95.3 kg  10/31/19 106 kg  10/17/19 97.5 kg     Intake/Output Summary (Last 24 hours) at 12/30/2019 0742 Last data filed at 12/30/2019 0114 Gross per 24 hour  Intake 2272.05 ml  Output 800 ml  Net 1472.05 ml     Physical Exam Gen Exam:Alert awake-not in any distress HEENT:atraumatic, normocephalic Chest: B/L clear to auscultation anteriorly CVS:S1S2 regular Abdomen:soft non tender, non distended Extremities:no edema Neurology: Non focal Skin: no rash   Data Review:    CBC Recent Labs  Lab 12/25/19 0754 12/28/19 0939 12/28/19 1902 12/29/19 0356 12/30/19 0451  WBC 4.9 6.5  --  3.3* 1.8*  HGB 11.8* 11.8* 12.2 9.9* 9.7*  HCT 39.7 39.8 36.0 33.4* 33.0*  PLT 368 271  --  234 238  MCV 75.0* 74.8*  --  74.7* 76.0*  MCH 22.3* 22.2*  --  22.1* 22.4*  MCHC 29.7* 29.6*  --  29.6* 29.4*  RDW 16.5* 16.8*  --  16.9* 17.2*  LYMPHSABS  --  0.5*  --  0.5* PENDING  MONOABS  --  0.1  --  0.1 PENDING  EOSABS  --  0.1  --  0.0 PENDING  BASOSABS  --  0.0  --  0.0 PENDING    Chemistries  Recent Labs  Lab 12/25/19 0754 12/25/19 0754 12/28/19 0939 12/28/19 1902 12/28/19 2249 12/29/19 0356 12/30/19 0451  NA 132*   < > 133* 133* 135 136 139  K 3.9   < > 4.0 5.7* 3.8 3.9 4.4  CL 101  --  102  --  103 107 110  CO2 19*  --  16*  --  19* 20* 19*  GLUCOSE 204*  --  365*  --  233* 171* 345*  BUN 8  --  18  --  18 15 12   CREATININE 0.72  --  1.03*  --  1.10* 0.85 0.74  CALCIUM 9.2  --  8.5*  --  8.0* 8.0* 7.9*   AST 21  --  25  --   --  31 40  ALT 20  --  20  --   --  22 25  ALKPHOS 92  --  80  --   --  66 62  BILITOT 0.4  --  0.5  --   --  0.1* 0.2*   < > = values in this interval not displayed.   ------------------------------------------------------------------------------------------------------------------  No results for input(s): CHOL, HDL, LDLCALC, TRIG, CHOLHDL, LDLDIRECT in the last 72 hours.  Lab Results  Component Value Date   HGBA1C 11.3 (H) 12/28/2019   ------------------------------------------------------------------------------------------------------------------ No results for input(s): TSH, T4TOTAL, T3FREE, THYROIDAB in the last 72 hours.  Invalid input(s): FREET3 ------------------------------------------------------------------------------------------------------------------ Recent Labs    12/28/19 2249  FERRITIN 258    Coagulation profile No results for input(s): INR, PROTIME in the last 168 hours.  Recent Labs    12/29/19 0356 12/30/19 0451  DDIMER 0.80* 0.86*    Cardiac Enzymes No results for input(s): CKMB, TROPONINI, MYOGLOBIN in the last 168 hours.  Invalid input(s): CK ------------------------------------------------------------------------------------------------------------------ No results found for: BNP  Micro Results Recent Results (from the past 240 hour(s))  SARS Coronavirus 2 by RT PCR (hospital order, performed in [facility] hospital lab) Nasopharyngeal Nasopharyngeal Swab     Status: Abnormal   Collection Time: 12/23/19 11:45 AM   Specimen: Nasopharyngeal Swab  Result Value Ref Range Status   SARS Coronavirus 2 POSITIVE (A) NEGATIVE Final    Comment: RESULT CALLED TO, READ BACK BY AND VERIFIED WITH: S.LEONARD RN AT 1257 ON 12/23/19 BY S.VANHOORNE Performed at [facility], [address], [place], [place] [address]   Urine culture     Status: Abnormal (Preliminary result)   Collection Time: 12/28/19  3:31 PM    Specimen: Urine, Random  Result Value Ref Range Status   Specimen Description URINE, RANDOM  Final   Special Requests   Final    NONE Performed at [facility], [address], [place], [place] [address]    Culture >=100,000 COLONIES/mL ESCHERICHIA COLI (A)  Final   Report Status PENDING  Incomplete  Culture, blood (Routine X 2) w Reflex to ID Panel     Status: None (Preliminary result)   Collection Time: 12/28/19 10:00 PM   Specimen: BLOOD  Result Value Ref Range Status   Specimen Description BLOOD SITE NOT SPECIFIED  Final   Special Requests   Final    BOTTLES DRAWN AEROBIC AND ANAEROBIC Blood Culture results may not be optimal due to an inadequate volume of blood received in culture bottles   Culture   Final    NO GROWTH 2 DAYS Performed at [facility], [address] [place], [place] [address]    Report Status PENDING  Incomplete    Radiology Reports DG Chest 2 View  Result Date: 12/28/2019 CLINICAL DATA:  COVID.  Cough.  Weakness.  Nausea.  Vomiting. EXAM: CHEST - 2 VIEW COMPARISON:  Prior chest radiographs 12/23/2019 and earlier. FINDINGS: Shallow inspiration radiograph. Heart size within normal limits. New from the prior examination of 12/23/2019, there are fairly extensive multifocal bilateral airspace opacities. No evidence of pleural effusion or pneumothorax. No acute bony abnormality identified. IMPRESSION: Fairly extensive multifocal bilateral airspace opacities, new as compared to the prior examination of 12/23/2019. Findings likely reflect atypical/viral pneumonia given the provided history of COVID positivity. Electronically Signed   By: Jackey Loge DO   On: 12/28/2019 10:14   DG Chest Port 1 View  Result Date: 12/23/2019 CLINICAL DATA:  Cough EXAM: PORTABLE CHEST 1 VIEW COMPARISON:  October 25, 2014 FINDINGS: The cardiomediastinal silhouette is normal in contour when accounting for technique. No pleural effusion. No pneumothorax. No acute pleuroparenchymal  abnormality. Visualized abdomen is unremarkable. No acute osseous abnormality noted. IMPRESSION: No acute cardiopulmonary abnormality. Electronically Signed   By: Meda Klinefelter MD   On: 12/23/2019 12:04

## 2019-12-30 NOTE — ED Notes (Signed)
Vascular at bedside

## 2019-12-30 NOTE — ED Notes (Signed)
Pt reports that she is now experiencing leg pain that is worse on L than R. Dorsalis pedis 2+ bilat, no obvious redness or swelling. Communicated this change with Dr. Jerral Ralph via secure chat, responded with plan for vascular study, orders placed.

## 2019-12-31 LAB — COMPREHENSIVE METABOLIC PANEL
ALT: 26 U/L (ref 0–44)
AST: 32 U/L (ref 15–41)
Albumin: 2.7 g/dL — ABNORMAL LOW (ref 3.5–5.0)
Alkaline Phosphatase: 63 U/L (ref 38–126)
Anion gap: 11 (ref 5–15)
BUN: 15 mg/dL (ref 6–20)
CO2: 19 mmol/L — ABNORMAL LOW (ref 22–32)
Calcium: 8.1 mg/dL — ABNORMAL LOW (ref 8.9–10.3)
Chloride: 108 mmol/L (ref 98–111)
Creatinine, Ser: 0.7 mg/dL (ref 0.44–1.00)
GFR calc Af Amer: >60 mL/min (ref >60)
GFR calc non Af Amer: >60 mL/min (ref >60)
Glucose, Bld: 308 mg/dL — ABNORMAL HIGH (ref 70–99)
Potassium: 4.1 mmol/L (ref 3.5–5.1)
Sodium: 138 mmol/L (ref 135–145)
Total Bilirubin: 0.3 mg/dL (ref 0.3–1.2)
Total Protein: 6.3 g/dL — ABNORMAL LOW (ref 6.5–8.1)

## 2019-12-31 LAB — CBC WITH DIFFERENTIAL/PLATELET
Abs Immature Granulocytes: 0.04 10*3/uL (ref 0.00–0.07)
Basophils Absolute: 0 10*3/uL (ref 0.0–0.1)
Basophils Relative: 0 %
Eosinophils Absolute: 0 10*3/uL (ref 0.0–0.5)
Eosinophils Relative: 0 %
HCT: 32 % — ABNORMAL LOW (ref 36.0–46.0)
Hemoglobin: 9.6 g/dL — ABNORMAL LOW (ref 12.0–15.0)
Immature Granulocytes: 1 %
Lymphocytes Relative: 20 %
Lymphs Abs: 0.8 10*3/uL (ref 0.7–4.0)
MCH: 22.2 pg — ABNORMAL LOW (ref 26.0–34.0)
MCHC: 30 g/dL (ref 30.0–36.0)
MCV: 73.9 fL — ABNORMAL LOW (ref 80.0–100.0)
Monocytes Absolute: 0.2 10*3/uL (ref 0.1–1.0)
Monocytes Relative: 5 %
Neutro Abs: 3.1 10*3/uL (ref 1.7–7.7)
Neutrophils Relative %: 74 %
Platelets: 264 10*3/uL (ref 150–400)
RBC: 4.33 MIL/uL (ref 3.87–5.11)
RDW: 17.3 % — ABNORMAL HIGH (ref 11.5–15.5)
WBC: 4 10*3/uL (ref 4.0–10.5)
nRBC: 0 % (ref 0.0–0.2)

## 2019-12-31 LAB — GLUCOSE, CAPILLARY
Glucose-Capillary: 314 mg/dL — ABNORMAL HIGH (ref 70–99)
Glucose-Capillary: 346 mg/dL — ABNORMAL HIGH (ref 70–99)
Glucose-Capillary: 315 mg/dL — ABNORMAL HIGH (ref 70–99)
Glucose-Capillary: 366 mg/dL — ABNORMAL HIGH (ref 70–99)

## 2019-12-31 LAB — D-DIMER, QUANTITATIVE: D-Dimer, Quant: 0.63 ug/mL-FEU — ABNORMAL HIGH (ref 0.00–0.50)

## 2019-12-31 LAB — C-REACTIVE PROTEIN: CRP: 0.7 mg/dL (ref <1.0)

## 2019-12-31 MED ORDER — ACETAMINOPHEN 325 MG PO TABS
650.0000 mg | ORAL_TABLET | Freq: Four times a day (QID) | ORAL | Status: DC | PRN
  Administered 2019-12-31: 650 mg via ORAL
  Filled 2019-12-31: qty 2

## 2019-12-31 MED ORDER — INSULIN GLARGINE 100 UNIT/ML ~~LOC~~ SOLN
24.0000 [IU] | Freq: Every day | SUBCUTANEOUS | Status: DC
  Administered 2020-01-01: 24 [IU] via SUBCUTANEOUS
  Filled 2019-12-31: qty 0.24

## 2019-12-31 MED ORDER — INSULIN ASPART 100 UNIT/ML ~~LOC~~ SOLN
4.0000 [IU] | Freq: Three times a day (TID) | SUBCUTANEOUS | Status: DC
  Administered 2020-01-01: 4 [IU] via SUBCUTANEOUS

## 2019-12-31 MED ORDER — INSULIN ASPART 100 UNIT/ML ~~LOC~~ SOLN
4.0000 [IU] | Freq: Three times a day (TID) | SUBCUTANEOUS | Status: DC
  Administered 2019-12-31 (×2): 4 [IU] via SUBCUTANEOUS

## 2019-12-31 NOTE — Plan of Care (Signed)
Discussed with patient plan of care for the evening, pain management and admission questions/process with some teach back displayed

## 2019-12-31 NOTE — Progress Notes (Signed)
PROGRESS NOTE                                                                                                                                                                                                             Patient Demographics:    [name] [name], is a 34 y.o. female, DOB - 1985-10-27, ZOX:[id number]  Outpatient Primary MD for the patient is Leeroy Bock, DO   Admit date - 12/28/2019   LOS - 3  Chief Complaint  Patient presents with  . Chest Pain  . Emesis  . Hyperglycemia       Brief Narrative: Patient is a 34 y.o. female with PMHx of DM-1, HTN, morbid obesity-who was diagnosed with COVID-19 on 8/13-presented to the hospital with cough, worsening shortness of breath.  She was found to have acute hypoxic respiratory failure requiring oxygen supplementation in the setting of COVID-19 PNA.  COVID-19 vaccinated status: Unvaccinated  Significant Events: 8/18>> Admit to [facility] for hypoxia from COVID-19  Significant studies: 8/18>>Chest x-ray: Extensive multifocal bilateral airspace opacities  COVID-19 medications: Steroids:8/18>> Remdesivir: 8/18>> Baricitinib: 8/18>>  Antibiotics: Rocephin: 8/18>>8/20  Microbiology data: 8/18 >>blood culture: Negative 8/18>> urine culture: E. coli (resistant to ampicillin)  Procedures: None  Consults: None  DVT prophylaxis: enoxaparin (LOVENOX) injection 40 mg Start: 12/28/19 2200    Subjective:   Less cough-feels better-inquiring when she can go home.  Bumped up to 5 L of oxygen overnight-have asked RN to start titrating down.   Assessment  & Plan :   Sepsis and acute Hypoxic Resp Failure due to Covid 19 Viral pneumonia: Improved-appears comfortable-attempt to titrate down FiO2-continue steroids/remdesivir/baricitinib.  Reassess tomorrow-if significantly better-could go home in the next day or so.  Fever: afebrile O2 requirements:  SpO2: 95 % O2  Flow Rate (L/min): 5 L/min   COVID-19 Labs: Recent Labs    12/28/19 2249 12/28/19 2249 12/29/19 0356 12/30/19 0451 12/31/19 0139  DDIMER 0.92*   < > 0.80* 0.86* 0.63*  FERRITIN 258  --   --   --   --   LDH 276*  --   --   --   --   CRP 11.5*   < > 10.6* 2.5* 0.7   < > = values in this interval not displayed.    No results found for: BNP  Recent Labs  Lab  12/28/19 2249  PROCALCITON 0.18    Lab Results  Component Value Date   SARSCOV2NAA POSITIVE (A) 12/23/2019   SARSCOV2NAA NOT DETECTED 02/21/2019     Prone/Incentive Spirometry: encouraged  incentive spirometry use 3-4/hour.  Early DKA and type I DM (A1c 11.3 on 8/18): DKA resolved-required insulin infusion-CBGs on the higher side due to steroid usage-increase Lantus to 24 units, add 400s of NovoLog with meals-continue SSI.  Follow and adjust.    Recent Labs    12/29/19 1132 12/30/19 2212 12/31/19 0729  GLUCAP 125* 317* 314*    Cystitis/UTI: Treated with 3 days of Rocephin-does not require any further antibiotics  Leukopenia: Likely secondary to COVID-19-improving  AKI: Hemodynamically mediated-resolved.  HTN: BP stable-no need for antihypertensives-also does not appear to be on any antihypertensives in the outpatient setting.  Follow  Morbid obesity: Estimated body mass index is 40.24 kg/m as calculated from the following:   Height as of this encounter: 5\' 1"  (1.549 m).   Weight as of this encounter: 96.6 kg.    ABG:    Component Value Date/Time   HCO3 18.7 (L) 12/28/2019 1902   TCO2 20 (L) 12/28/2019 1902   ACIDBASEDEF 7.0 (H) 12/28/2019 1902   O2SAT 83.0 12/28/2019 1902    Vent Settings: N/A  Condition - Stable  Family Communication  :  Father [phone]) updated over the phone 8/19-since relatively stable-we will update father on 8/21.  Code Status :  Full Code  Diet :  Diet Order            Diet heart healthy/carb modified Room service appropriate? Yes; Fluid consistency:  Thin  Diet effective now                  Disposition Plan  :   Status is: Inpatient  Remains inpatient appropriate because:Inpatient level of care appropriate due to severity of illness   Dispo: The patient is from: Home              Anticipated d/c is to: Home              Anticipated d/c date is: 3 days              Patient currently is not medically stable to d/c.   Barriers to discharge: Hypoxia requiring O2 supplementation/complete 5 days of IV Remdesivir  Antimicorbials  :    Anti-infectives (From admission, onward)   Start     Dose/Rate Route Frequency Ordered Stop   12/29/19 2200  cefTRIAXone (ROCEPHIN) 1 g in sodium chloride 0.9 % 100 mL IVPB        1 g 200 mL/hr over 30 Minutes Intravenous Every 24 hours 12/28/19 2029 12/30/19 2320   12/29/19 1000  remdesivir 100 mg in sodium chloride 0.9 % 100 mL IVPB       "Followed by" Linked Group Details   100 mg 200 mL/hr over 30 Minutes Intravenous Daily 12/28/19 2029 01/02/20 0959   12/28/19 2100  remdesivir 200 mg in sodium chloride 0.9% 250 mL IVPB       "Followed by" Linked Group Details   200 mg 580 mL/hr over 30 Minutes Intravenous Once 12/28/19 2029 12/28/19 2247   12/28/19 1945  cefTRIAXone (ROCEPHIN) 1 g in sodium chloride 0.9 % 100 mL IVPB        1 g 200 mL/hr over 30 Minutes Intravenous  Once 12/28/19 1936 12/28/19 2200      Inpatient Medications  Scheduled Meds: . albuterol  2 puff Inhalation QID  . vitamin C  500 mg Oral Daily  . baricitinib  4 mg Oral Daily  . benzonatate  200 mg Oral TID  . cholecalciferol  1,000 Units Oral Daily  . enoxaparin (LOVENOX) injection  40 mg Subcutaneous Q24H  . insulin aspart  0-20 Units Subcutaneous TID WC  . insulin aspart  0-5 Units Subcutaneous QHS  . insulin glargine  18 Units Subcutaneous Daily  . methylPREDNISolone (SOLU-MEDROL) injection  0.5 mg/kg Intravenous Q12H  . pantoprazole  40 mg Oral Daily  . zinc sulfate  220 mg Oral Daily   Continuous  Infusions: . remdesivir 100 mg in NS 100 mL 100 mg (12/31/19 0844)   PRN Meds:.alum & mag hydroxide-simeth, chlorpheniramine-HYDROcodone, dextrose, guaiFENesin-dextromethorphan, menthol-cetylpyridinium, promethazine   Time Spent in minutes  25  See all Orders from today for further details   Jeoffrey Massed M.D on 12/31/2019 at 10:19 AM  To page go to [email or web address] - use universal password  [facility] -  Office  [phone]    Objective:   Vitals:   12/31/19 0527 12/31/19 0600 12/31/19 0617 12/31/19 0735  BP: 114/71   (!) 155/100  Pulse: 79   78  Resp:   (!) 22 16  Temp: 98.1 F (36.7 C)   97.8 F (36.6 C)  TempSrc: Oral     SpO2: 95%  94% 95%  Weight:  96.6 kg    Height:  5\' 1"  (1.549 m)      Wt Readings from Last 3 Encounters:  12/31/19 96.6 kg  12/25/19 95.3 kg  10/31/19 106 kg     Intake/Output Summary (Last 24 hours) at 12/31/2019 1019 Last data filed at 12/31/2019 0500 Gross per 24 hour  Intake 580 ml  Output 1200 ml  Net -620 ml     Physical Exam Gen Exam:Alert awake-not in any distress HEENT:atraumatic, normocephalic Chest: B/L clear to auscultation anteriorly CVS:S1S2 regular Abdomen:soft non tender, non distended Extremities:no edema Neurology: Non focal Skin: no rash   Data Review:    CBC Recent Labs  Lab 12/25/19 0754 12/25/19 0754 12/28/19 0939 12/28/19 1902 12/29/19 0356 12/30/19 0451 12/31/19 0139  WBC 4.9  --  6.5  --  3.3* 1.8* 4.0  HGB 11.8*   < > 11.8* 12.2 9.9* 9.7* 9.6*  HCT 39.7   < > 39.8 36.0 33.4* 33.0* 32.0*  PLT 368  --  271  --  234 238 264  MCV 75.0*  --  74.8*  --  74.7* 76.0* 73.9*  MCH 22.3*  --  22.2*  --  22.1* 22.4* 22.2*  MCHC 29.7*  --  29.6*  --  29.6* 29.4* 30.0  RDW 16.5*  --  16.8*  --  16.9* 17.2* 17.3*  LYMPHSABS  --   --  0.5*  --  0.5* 0.5* 0.8  MONOABS  --   --  0.1  --  0.1 0.1 0.2  EOSABS  --   --  0.1  --  0.0 0.0 0.0  BASOSABS  --   --  0.0  --  0.0 0.0 0.0   < > = values in  this interval not displayed.    Chemistries  Recent Labs  Lab 12/25/19 0754 12/25/19 0754 12/28/19 0939 12/28/19 0939 12/28/19 1902 12/28/19 2249 12/29/19 0356 12/30/19 0451 12/31/19 0139  NA 132*   < > 133*   < > 133* 135 136 139 138  K 3.9   < > 4.0   < > 5.7*  3.8 3.9 4.4 4.1  CL 101   < > 102  --   --  103 107 110 108  CO2 19*   < > 16*  --   --  19* 20* 19* 19*  GLUCOSE 204*   < > 365*  --   --  233* 171* 345* 308*  BUN 8   < > 18  --   --  18 15 12 15   CREATININE 0.72   < > 1.03*  --   --  1.10* 0.85 0.74 0.70  CALCIUM 9.2   < > 8.5*  --   --  8.0* 8.0* 7.9* 8.1*  AST 21  --  25  --   --   --  31 40 32  ALT 20  --  20  --   --   --  22 25 26   ALKPHOS 92  --  80  --   --   --  66 62 63  BILITOT 0.4  --  0.5  --   --   --  0.1* 0.2* 0.3   < > = values in this interval not displayed.   ------------------------------------------------------------------------------------------------------------------ No results for input(s): CHOL, HDL, LDLCALC, TRIG, CHOLHDL, LDLDIRECT in the last 72 hours.  Lab Results  Component Value Date   HGBA1C 11.3 (H) 12/28/2019   ------------------------------------------------------------------------------------------------------------------ No results for input(s): TSH, T4TOTAL, T3FREE, THYROIDAB in the last 72 hours.  Invalid input(s): FREET3 ------------------------------------------------------------------------------------------------------------------ Recent Labs    12/28/19 2249  FERRITIN 258    Coagulation profile No results for input(s): INR, PROTIME in the last 168 hours.  Recent Labs    12/30/19 0451 12/31/19 0139  DDIMER 0.86* 0.63*    Cardiac Enzymes No results for input(s): CKMB, TROPONINI, MYOGLOBIN in the last 168 hours.  Invalid input(s): CK ------------------------------------------------------------------------------------------------------------------ No results found for: BNP  Micro Results Recent Results  (from the past 240 hour(s))  SARS Coronavirus 2 by RT PCR (hospital order, performed in [facility] hospital lab) Nasopharyngeal Nasopharyngeal Swab     Status: Abnormal   Collection Time: 12/23/19 11:45 AM   Specimen: Nasopharyngeal Swab  Result Value Ref Range Status   SARS Coronavirus 2 POSITIVE (A) NEGATIVE Final    Comment: RESULT CALLED TO, READ BACK BY AND VERIFIED WITH: S.LEONARD RN AT 1257 ON 12/23/19 BY S.VANHOORNE Performed at [facility], [address] [place], [place] [address]   Urine culture     Status: Abnormal   Collection Time: 12/28/19  3:31 PM   Specimen: Urine, Random  Result Value Ref Range Status   Specimen Description URINE, RANDOM  Final   Special Requests   Final    NONE Performed at [facility], [address], [place], [place] [address]    Culture >=100,000 COLONIES/mL ESCHERICHIA COLI (A)  Final   Report Status 12/30/2019 FINAL  Final   Organism ID, Bacteria ESCHERICHIA COLI (A)  Final      Susceptibility   Escherichia coli - MIC*    AMPICILLIN >=32 RESISTANT Resistant     CEFAZOLIN <=4 SENSITIVE Sensitive     CEFTRIAXONE <=0.25 SENSITIVE Sensitive     CIPROFLOXACIN <=0.25 SENSITIVE Sensitive     GENTAMICIN <=1 SENSITIVE Sensitive     IMIPENEM <=0.25 SENSITIVE Sensitive     NITROFURANTOIN <=16 SENSITIVE Sensitive     TRIMETH/SULFA <=20 SENSITIVE Sensitive     AMPICILLIN/SULBACTAM <=2 SENSITIVE Sensitive     PIP/TAZO <=4 SENSITIVE Sensitive     * >=  100,000 COLONIES/mL ESCHERICHIA COLI  Culture, blood (Routine X 2) w Reflex to ID Panel     Status: None (Preliminary result)   Collection Time: 12/28/19 10:00 PM   Specimen: BLOOD  Result Value Ref Range Status   Specimen Description BLOOD SITE NOT SPECIFIED  Final   Special Requests   Final    BOTTLES DRAWN AEROBIC AND ANAEROBIC Blood Culture results may not be optimal due to an inadequate volume of blood received in culture bottles   Culture   Final    NO GROWTH 2  DAYS Performed at [facility], [address], [place], [place] [address]    Report Status PENDING  Incomplete    Radiology Reports DG Chest 2 View  Result Date: 12/28/2019 CLINICAL DATA:  COVID.  Cough.  Weakness.  Nausea.  Vomiting. EXAM: CHEST - 2 VIEW COMPARISON:  Prior chest radiographs 12/23/2019 and earlier. FINDINGS: Shallow inspiration radiograph. Heart size within normal limits. New from the prior examination of 12/23/2019, there are fairly extensive multifocal bilateral airspace opacities. No evidence of pleural effusion or pneumothorax. No acute bony abnormality identified. IMPRESSION: Fairly extensive multifocal bilateral airspace opacities, new as compared to the prior examination of 12/23/2019. Findings likely reflect atypical/viral pneumonia given the provided history of COVID positivity. Electronically Signed   By: Jackey Loge DO   On: 12/28/2019 10:14   DG Chest Port 1 View  Result Date: 12/23/2019 CLINICAL DATA:  Cough EXAM: PORTABLE CHEST 1 VIEW COMPARISON:  October 25, 2014 FINDINGS: The cardiomediastinal silhouette is normal in contour when accounting for technique. No pleural effusion. No pneumothorax. No acute pleuroparenchymal abnormality. Visualized abdomen is unremarkable. No acute osseous abnormality noted. IMPRESSION: No acute cardiopulmonary abnormality. Electronically Signed   By: Meda Klinefelter MD   On: 12/23/2019 12:04   VAS [place] LOWER EXTREMITY VENOUS (DVT)  Result Date: 12/31/2019  Lower Venous DVTStudy Indications: Swelling.  Comparison Study: No prior studies. Performing Technologist: Jean Rosenthal  Examination Guidelines: A complete evaluation includes B-mode imaging, spectral Doppler, color Doppler, and power Doppler as needed of all accessible portions of each vessel. Bilateral testing is considered an integral part of a complete examination. Limited examinations for reoccurring indications may be performed as noted. The reflux portion of the exam is  performed with the patient in reverse Trendelenburg.  +---------+---------------+---------+-----------+----------+--------------+ RIGHT    CompressibilityPhasicitySpontaneityPropertiesThrombus Aging +---------+---------------+---------+-----------+----------+--------------+ CFV      Full           Yes      Yes                                 +---------+---------------+---------+-----------+----------+--------------+ SFJ      Full                                                        +---------+---------------+---------+-----------+----------+--------------+ FV Prox  Full                                                        +---------+---------------+---------+-----------+----------+--------------+ FV Mid   Full                                                        +---------+---------------+---------+-----------+----------+--------------+  FV DistalFull                                                        +---------+---------------+---------+-----------+----------+--------------+ PFV      Full                                                        +---------+---------------+---------+-----------+----------+--------------+ POP      Full           Yes      Yes                                 +---------+---------------+---------+-----------+----------+--------------+ PTV      Full                                                        +---------+---------------+---------+-----------+----------+--------------+ PERO     Full                                                        +---------+---------------+---------+-----------+----------+--------------+   +---------+---------------+---------+-----------+----------+--------------+ LEFT     CompressibilityPhasicitySpontaneityPropertiesThrombus Aging +---------+---------------+---------+-----------+----------+--------------+ CFV      Full           Yes      Yes                                  +---------+---------------+---------+-----------+----------+--------------+ SFJ      Full                                                        +---------+---------------+---------+-----------+----------+--------------+ FV Prox  Full                                                        +---------+---------------+---------+-----------+----------+--------------+ FV Mid   Full                                                        +---------+---------------+---------+-----------+----------+--------------+ FV DistalFull                                                        +---------+---------------+---------+-----------+----------+--------------+  PFV      Full                                                        +---------+---------------+---------+-----------+----------+--------------+ POP      Full           Yes      Yes                                 +---------+---------------+---------+-----------+----------+--------------+ PTV      Full                                                        +---------+---------------+---------+-----------+----------+--------------+ PERO     Full                                                        +---------+---------------+---------+-----------+----------+--------------+     Summary: RIGHT: - There is no evidence of deep vein thrombosis in the lower extremity.  - No cystic structure found in the popliteal fossa.  LEFT: - There is no evidence of deep vein thrombosis in the lower extremity.  - No cystic structure found in the popliteal fossa.  *See table(s) above for measurements and observations. Electronically signed by Waverly Ferrari MD on 12/31/2019 at 6:37:14 AM.    Final

## 2019-12-31 NOTE — Progress Notes (Signed)
SATURATION QUALIFICATIONS: (This note is used to comply with regulatory documentation for home oxygen)  Patient Saturations on Room Air at Rest =  87%  Patient Saturations on Room Air while Ambulating = 85%  Patient Saturations on 2 Liters of oxygen while Ambulating = 91%  Please briefly explain why patient needs home oxygen: pt said she was not having shortness of breath

## 2020-01-01 DIAGNOSIS — J9601 Acute respiratory failure with hypoxia: Secondary | ICD-10-CM

## 2020-01-01 LAB — D-DIMER, QUANTITATIVE: D-Dimer, Quant: 0.59 ug/mL-FEU — ABNORMAL HIGH (ref 0.00–0.50)

## 2020-01-01 LAB — COMPREHENSIVE METABOLIC PANEL
ALT: 24 U/L (ref 0–44)
AST: 21 U/L (ref 15–41)
Albumin: 2.7 g/dL — ABNORMAL LOW (ref 3.5–5.0)
Alkaline Phosphatase: 64 U/L (ref 38–126)
Anion gap: 9 (ref 5–15)
BUN: 16 mg/dL (ref 6–20)
CO2: 22 mmol/L (ref 22–32)
Calcium: 8.3 mg/dL — ABNORMAL LOW (ref 8.9–10.3)
Chloride: 104 mmol/L (ref 98–111)
Creatinine, Ser: 0.73 mg/dL (ref 0.44–1.00)
GFR calc Af Amer: >60 mL/min (ref >60)
GFR calc non Af Amer: >60 mL/min (ref >60)
Glucose, Bld: 338 mg/dL — ABNORMAL HIGH (ref 70–99)
Potassium: 3.7 mmol/L (ref 3.5–5.1)
Sodium: 135 mmol/L (ref 135–145)
Total Bilirubin: 0.1 mg/dL — ABNORMAL LOW (ref 0.3–1.2)
Total Protein: 6.2 g/dL — ABNORMAL LOW (ref 6.5–8.1)

## 2020-01-01 LAB — CBC WITH DIFFERENTIAL/PLATELET
Abs Immature Granulocytes: 0.04 10*3/uL (ref 0.00–0.07)
Basophils Absolute: 0 10*3/uL (ref 0.0–0.1)
Basophils Relative: 0 %
Eosinophils Absolute: 0 10*3/uL (ref 0.0–0.5)
Eosinophils Relative: 0 %
HCT: 31.9 % — ABNORMAL LOW (ref 36.0–46.0)
Hemoglobin: 9.6 g/dL — ABNORMAL LOW (ref 12.0–15.0)
Immature Granulocytes: 1 %
Lymphocytes Relative: 20 %
Lymphs Abs: 0.7 10*3/uL (ref 0.7–4.0)
MCH: 22.1 pg — ABNORMAL LOW (ref 26.0–34.0)
MCHC: 30.1 g/dL (ref 30.0–36.0)
MCV: 73.3 fL — ABNORMAL LOW (ref 80.0–100.0)
Monocytes Absolute: 0.3 10*3/uL (ref 0.1–1.0)
Monocytes Relative: 7 %
Neutro Abs: 2.6 10*3/uL (ref 1.7–7.7)
Neutrophils Relative %: 72 %
Platelets: 285 10*3/uL (ref 150–400)
RBC: 4.35 MIL/uL (ref 3.87–5.11)
RDW: 17.1 % — ABNORMAL HIGH (ref 11.5–15.5)
WBC: 3.5 10*3/uL — ABNORMAL LOW (ref 4.0–10.5)
nRBC: 0 % (ref 0.0–0.2)

## 2020-01-01 LAB — GLUCOSE, CAPILLARY
Glucose-Capillary: 186 mg/dL — ABNORMAL HIGH (ref 70–99)
Glucose-Capillary: 240 mg/dL — ABNORMAL HIGH (ref 70–99)
Glucose-Capillary: 344 mg/dL — ABNORMAL HIGH (ref 70–99)
Glucose-Capillary: 280 mg/dL — ABNORMAL HIGH (ref 70–99)
Glucose-Capillary: 263 mg/dL — ABNORMAL HIGH (ref 70–99)
Glucose-Capillary: 334 mg/dL — ABNORMAL HIGH (ref 70–99)

## 2020-01-01 LAB — C-REACTIVE PROTEIN: CRP: 0.6 mg/dL (ref <1.0)

## 2020-01-01 MED ORDER — ALBUTEROL SULFATE HFA 108 (90 BASE) MCG/ACT IN AERS: 2.0000 | INHALATION_SPRAY | RESPIRATORY_TRACT | 0 refills | Status: DC | PRN

## 2020-01-01 MED ORDER — MIDAZOLAM HCL 2 MG/2ML IJ SOLN
INTRAMUSCULAR | Status: AC
  Filled 2020-01-01: qty 2

## 2020-01-01 MED ORDER — FREESTYLE TEST VI STRP: 1.0000 | ORAL_STRIP | Status: DC

## 2020-01-01 MED ORDER — METHYLPREDNISOLONE SODIUM SUCC 40 MG IJ SOLR
40.0000 mg | Freq: Two times a day (BID) | INTRAMUSCULAR | Status: DC
  Administered 2020-01-01: 40 mg via INTRAVENOUS
  Filled 2020-01-01: qty 1

## 2020-01-01 MED ORDER — FREESTYLE LANCETS MISC: 1.0000 | Status: DC

## 2020-01-01 MED ORDER — HYDROCOD POLST-CPM POLST ER 10-8 MG/5ML PO SUER
5.0000 mL | Freq: Two times a day (BID) | ORAL | 0 refills | Status: DC | PRN
Start: 2020-01-01 — End: 2020-03-22

## 2020-01-01 MED ORDER — BENZONATATE 200 MG PO CAPS: 200.0000 mg | ORAL_CAPSULE | Freq: Three times a day (TID) | ORAL | 0 refills | Status: DC

## 2020-01-01 MED ORDER — INSULIN GLARGINE 100 UNIT/ML SOLOSTAR PEN: 18.0000 [IU] | PEN_INJECTOR | Freq: Every day | SUBCUTANEOUS | 0 refills | Status: DC

## 2020-01-01 MED ORDER — INSULIN ASPART 100 UNIT/ML FLEXPEN: PEN_INJECTOR | SUBCUTANEOUS | 0 refills | Status: DC

## 2020-01-01 MED ORDER — INSULIN PEN NEEDLE 32G X 8 MM MISC: 0 refills | Status: DC

## 2020-01-01 MED ORDER — PREDNISONE 10 MG PO TABS: ORAL_TABLET | ORAL | 0 refills | Status: DC

## 2020-01-01 NOTE — TOC Transition Note (Addendum)
Transition of Care [facility]) - CM/SW Discharge Note   Patient Details  Name: KAYCI BELLEVILLE MRN: [id number] Date of Birth: 1985-07-25  Transition of Care [facility]) CM/SW Contact:  Lawerance Sabal, RN Phone Number: 01/01/2020, 10:43 AM   Clinical Narrative:    [employer] letter explained to patient and left at nurse's station for nurse to give to patient with printed prescriptions. Nurse aware. Patient will need charity home oxygen, in process. Spoke w patient and explained that she would need to provide a card number as backup to [employer] as part of having it provided through their charity program. She said she did not have a card with her, it was at home. I instructed her to call her family to obtain number as she will need oxygen to be discharged. She said she didn't want to wait for oxygen, and it was "bullcrap" that she needed it to go home.  10:27 Request for charity O2 sent to [facility]  10:58  Request for charity O2 sent to [facility]  12:15 Notified by Gwynne Edinger that when she spoke to patient, she refused to provide card number and told her she didn't need the oxygen. I reported this to the bedside nurse and attending and requested they explain to her why she needed oxygen.     Final next level of care: Home/Self Care Barriers to Discharge: No Barriers Identified   Patient Goals and CMS Choice        Discharge Placement                       Discharge Plan and Services                DME Arranged: Oxygen DME Agency: [employer] Date DME Agency Contacted: 01/01/20 Time DME Agency Contacted: 1027 Representative spoke with at DME Agency: Ian Malkin            Social Determinants of Health (SDOH) Interventions     Readmission Risk Interventions No flowsheet data found.

## 2020-01-01 NOTE — Progress Notes (Signed)
01/01/2020 Rn was notified by the case manager to talk to the patient concerning her need for oxygen. When Rn went in the room she was told by the patient she does not have a credit card information concerning the oxygen, because she is not working and, she was asked if her husband had one she said no he was receiving disability. Patient said he does not need any oxygen and she need to go home. Notify case manager via chat and Dr Jerral Ralph was made aware that patient did not want the oxygen and still decided she was going leave the hospital. Patient did sign against medical advice form. [facility] RN.

## 2020-01-01 NOTE — Discharge Instructions (Signed)
Person Under Monitoring Name: [name] [name]  Location: [address] [place] [address]   Infection Prevention Recommendations for Individuals Confirmed to have, or Being Evaluated for, 2019 Novel Coronavirus (COVID-19) Infection Who Receive Care at Home  Individuals who are confirmed to have, or are being evaluated for, COVID-19 should follow the prevention steps below until a healthcare provider or local or state health department says they can return to normal activities.  Stay home except to get medical care You should restrict activities outside your home, except for getting medical care. Do not go to work, school, or public areas, and do not use public transportation or taxis.  Call ahead before visiting your doctor Before your medical appointment, call the healthcare provider and tell them that you have, or are being evaluated for, COVID-19 infection. This will help the healthcare provider's office take steps to keep other people from getting infected. Ask your healthcare provider to call the local or state health department.  Monitor your symptoms Seek prompt medical attention if your illness is worsening (e.g., difficulty breathing). Before going to your medical appointment, call the healthcare provider and tell them that you have, or are being evaluated for, COVID-19 infection. Ask your healthcare provider to call the local or state health department.  Wear a facemask You should wear a facemask that covers your nose and mouth when you are in the same room with other people and when you visit a healthcare provider. People who live with or visit you should also wear a facemask while they are in the same room with you.  Separate yourself from other people in your home As much as possible, you should stay in a different room from other people in your home. Also, you should use a separate bathroom, if available.  Avoid sharing household items You should not  share dishes, drinking glasses, cups, eating utensils, towels, bedding, or other items with other people in your home. After using these items, you should wash them thoroughly with soap and water.  Cover your coughs and sneezes Cover your mouth and nose with a tissue when you cough or sneeze, or you can cough or sneeze into your sleeve. Throw used tissues in a lined trash can, and immediately wash your hands with soap and water for at least 20 seconds or use an alcohol-based hand rub.  Wash your [employer] your hands often and thoroughly with soap and water for at least 20 seconds. You can use an alcohol-based hand sanitizer if soap and water are not available and if your hands are not visibly dirty. Avoid touching your eyes, nose, and mouth with unwashed hands.   Prevention Steps for Caregivers and Household Members of Individuals Confirmed to have, or Being Evaluated for, COVID-19 Infection Being Cared for in the Home  If you live with, or provide care at home for, a person confirmed to have, or being evaluated for, COVID-19 infection please follow these guidelines to prevent infection:  Follow healthcare provider's instructions Make sure that you understand and can help the patient follow any healthcare provider instructions for all care.  Provide for the patient's basic needs You should help the patient with basic needs in the home and provide support for getting groceries, prescriptions, and other personal needs.  Monitor the patient's symptoms If they are getting sicker, call his or her medical provider and tell them that the patient has, or is being evaluated for, COVID-19 infection. This will help the healthcare provider's office  take steps to keep other people from getting infected. Ask the healthcare provider to call the local or state health department.  Limit the number of people who have contact with the patient  If possible, have only one caregiver for the  patient.  Other household members should stay in another home or place of residence. If this is not possible, they should stay  in another room, or be separated from the patient as much as possible. Use a separate bathroom, if available.  Restrict visitors who do not have an essential need to be in the home.  Keep older adults, very young children, and other sick people away from the patient Keep older adults, very young children, and those who have compromised immune systems or chronic health conditions away from the patient. This includes people with chronic heart, lung, or kidney conditions, diabetes, and cancer.  Ensure good ventilation Make sure that shared spaces in the home have good air flow, such as from an air conditioner or an opened window, weather permitting.  Wash your hands often  Wash your hands often and thoroughly with soap and water for at least 20 seconds. You can use an alcohol based hand sanitizer if soap and water are not available and if your hands are not visibly dirty.  Avoid touching your eyes, nose, and mouth with unwashed hands.  Use disposable paper towels to dry your hands. If not available, use dedicated cloth towels and replace them when they become wet.  Wear a facemask and gloves  Wear a disposable facemask at all times in the room and gloves when you touch or have contact with the patient's blood, body fluids, and/or secretions or excretions, such as sweat, saliva, sputum, nasal mucus, vomit, urine, or feces.  Ensure the mask fits over your nose and mouth tightly, and do not touch it during use.  Throw out disposable facemasks and gloves after using them. Do not reuse.  Wash your hands immediately after removing your facemask and gloves.  If your personal clothing becomes contaminated, carefully remove clothing and launder. Wash your hands after handling contaminated clothing.  Place all used disposable facemasks, gloves, and other waste in a lined  container before disposing them with other household waste.  Remove gloves and wash your hands immediately after handling these items.  Do not share dishes, glasses, or other household items with the patient  Avoid sharing household items. You should not share dishes, drinking glasses, cups, eating utensils, towels, bedding, or other items with a patient who is confirmed to have, or being evaluated for, COVID-19 infection.  After the person uses these items, you should wash them thoroughly with soap and water.  Wash laundry thoroughly  Immediately remove and wash clothes or bedding that have blood, body fluids, and/or secretions or excretions, such as sweat, saliva, sputum, nasal mucus, vomit, urine, or feces, on them.  Wear gloves when handling laundry from the patient.  Read and follow directions on labels of laundry or clothing items and detergent. In general, wash and dry with the warmest temperatures recommended on the label.  Clean all areas the individual has used often  Clean all touchable surfaces, such as counters, tabletops, doorknobs, bathroom fixtures, toilets, phones, keyboards, tablets, and bedside tables, every day. Also, clean any surfaces that may have blood, body fluids, and/or secretions or excretions on them.  Wear gloves when cleaning surfaces the patient has come in contact with.  Use a diluted bleach solution (e.g., dilute bleach with 1 part  bleach and 10 parts water) or a household disinfectant with a label that says EPA-registered for coronaviruses. To make a bleach solution at home, add 1 tablespoon of bleach to 1 quart (4 cups) of water. For a larger supply, add  cup of bleach to 1 gallon (16 cups) of water.  Read labels of cleaning products and follow recommendations provided on product labels. Labels contain instructions for safe and effective use of the cleaning product including precautions you should take when applying the product, such as wearing gloves or  eye protection and making sure you have good ventilation during use of the product.  Remove gloves and wash hands immediately after cleaning.  Monitor yourself for signs and symptoms of illness Caregivers and household members are considered close contacts, should monitor their health, and will be asked to limit movement outside of the home to the extent possible. Follow the monitoring steps for close contacts listed on the symptom monitoring form.   ? If you have additional questions, contact your local health department or call the epidemiologist on call at [phone] (available 24/7). ? This guidance is subject to change. For the most up-to-date guidance from [facility], please refer to their website: [email or web address]

## 2020-01-01 NOTE — Progress Notes (Signed)
Informed by case management-that there are difficulties arranging her home O2-I called the patient's room-and explained to her-that the safest thing is to remain hospitalized and we are sure that she does not require oxygen.  Patient was very agitated to hear my recommendation-and said that she is getting depressed and wants to leave the hospital no matter what.  I explained the importance of hospitalization as case management is having difficulties (please see their note) arranging for home O2.  However patient is adamant on leaving-she understands the risks-including life-threatening and life disabling risk of hypoxemia-but still wants to leave-and hence will be signing out shortly AGAINST MEDICAL ADVICE.

## 2020-01-01 NOTE — Discharge Summary (Signed)
PATIENT DETAILS Name: [name] [name] Age: 34 y.o. Sex: female Date of Birth: 06-17-1985 MRN: [id number]. Admitting Physician: [name] Leff, MD DXI:[id number], [name] Herald, DO  Admit Date: 12/28/2019 Discharge date: 01/01/2020  Recommendations for Outpatient Follow-up:  1. Follow up with PCP in 1-2 weeks 2. Please obtain CMP/CBC in one week 3. Repeat Chest Xray in 4-6 week 4. Please reassess on follow up whether patient still requires Home O2  Admitted From:  Home  Disposition: [place]: No  Equipment/Devices: Oxygen 2L  Discharge Condition: Stable  CODE STATUS: FULL CODE  Diet recommendation:  Diet Order            Diet - low sodium heart healthy           Diet Carb Modified           Diet heart healthy/carb modified Room service appropriate? Yes; Fluid consistency: Thin  Diet effective now                 Brief Narrative: Patient is a 34 y.o. female with PMHx of DM-1, HTN, morbid obesity-who was diagnosed with COVID-19 on 8/13-presented to the hospital with cough, worsening shortness of breath.  She was found to have acute hypoxic respiratory failure requiring oxygen supplementation in the setting of COVID-19 PNA.  COVID-19 vaccinated status: Unvaccinated  Significant Events: 8/18>> Admit to [facility] for hypoxia from COVID-19  Significant studies: 8/18>>Chest x-ray: Extensive multifocal bilateral airspace opacities 8/22>> Lower Ext Doppler:no DVT  COVID-19 medications: Steroids:8/18>> Remdesivir: 8/18>>8/22 Baricitinib: 8/18>>8/22  Antibiotics: Rocephin: 8/18>>8/20  Microbiology data: 8/18 >>blood culture: Negative 8/18>> urine culture: E. coli (resistant to ampicillin)  Procedures: None  Consults: None  Brief Hospital Course: Sepsis and acute Hypoxic Resp Failure due to Covid 19 Viral pneumonia: significantly better-coughing spells have essentially resolved, treated with steroids/remdesivir/baricitinib.  Although  better-still needs around just 2L of O2 to maintain saturations. Very anxious to go home today-she will complete remdesivir x 5 dose today-following which she will be discharged on home O2 and tapering prednisone.    COVID-19 Labs:  Recent Labs    12/30/19 0451 12/31/19 0139 01/01/20 0142  DDIMER 0.86* 0.63* 0.59*  CRP 2.5* 0.7 0.6    Lab Results  Component Value Date   SARSCOV2NAA POSITIVE (A) 12/23/2019   SARSCOV2NAA NOT DETECTED 02/21/2019     Early DKA and type I DM (A1c 11.3 on 8/18) with steroid induced hyperglycemia: DKA resolved-required insulin infusion-CBGs on the higher side due to steroid usage-managed with Lantus/SSI-have asked patient to stay on around 18-20 units of lantus insulin while she is on steroids-and then to go back to her usual regimen. She claims she is very familiar with adjusting insulin-and checks her sugars several times a day. She is aware of the symptoms of hypoglycemia and needed interventions  Cystitis/UTI: Treated with 3 days of Rocephin-does not require any further antibiotics  Leukopenia: Likely secondary to COVID-19-improving  AKI: Hemodynamically mediated-resolved.  HTN: BP stable-no need for antihypertensives-also does not appear to be on any antihypertensives in the outpatient setting.  Follow  Morbid obesity: Estimated body mass index is 40.24 kg/m as calculated from the following:   Height as of this encounter: _0  (1.549 m).   Weight as of this encounter: 96.6 kg.    Discharge Diagnoses:  Principal Problem:   Pneumonia due to COVID-19 virus Active Problems:   Acute hypoxemic respiratory failure due to COVID-19 [facility])   UTI (urinary tract infection)   Sepsis ([place])  DKA (diabetic ketoacidoses) [facility])   Discharge Instructions:    Person Under Monitoring Name: [name] [name]  Location: [place] [address]   Infection Prevention Recommendations for Individuals Confirmed to have, or Being  Evaluated for, 2019 Novel Coronavirus (COVID-19) Infection Who Receive Care at Home  Individuals who are confirmed to have, or are being evaluated for, COVID-19 should follow the prevention steps below until a healthcare provider or local or state health department says they can return to normal activities.  Stay home except to get medical care You should restrict activities outside your home, except for getting medical care. Do not go to work, school, or public areas, and do not use public transportation or taxis.  Call ahead before visiting your doctor Before your medical appointment, call the healthcare provider and tell them that you have, or are being evaluated for, COVID-19 infection. This will help the healthcare provider's office take steps to keep other people from getting infected. Ask your healthcare provider to call the local or state health department.  Monitor your symptoms Seek prompt medical attention if your illness is worsening (e.g., difficulty breathing). Before going to your medical appointment, call the healthcare provider and tell them that you have, or are being evaluated for, COVID-19 infection. Ask your healthcare provider to call the local or state health department.  Wear a facemask You should wear a facemask that covers your nose and mouth when you are in the same room with other people and when you visit a healthcare provider. People who live with or visit you should also wear a facemask while they are in the same room with you.  Separate yourself from other people in your home As much as possible, you should stay in a different room from other people in your home. Also, you should use a separate bathroom, if available.  Avoid sharing household items You should not share dishes, drinking glasses, cups, eating utensils, towels, bedding, or other items with other people in your home. After using these items, you should wash them thoroughly with soap and  water.  Cover your coughs and sneezes Cover your mouth and nose with a tissue when you cough or sneeze, or you can cough or sneeze into your sleeve. Throw used tissues in a lined trash can, and immediately wash your hands with soap and water for at least 20 seconds or use an alcohol-based hand rub.  Wash your [facility] your hands often and thoroughly with soap and water for at least 20 seconds. You can use an alcohol-based hand sanitizer if soap and water are not available and if your hands are not visibly dirty. Avoid touching your eyes, nose, and mouth with unwashed hands.   Prevention Steps for Caregivers and Household Members of Individuals Confirmed to have, or Being Evaluated for, COVID-19 Infection Being Cared for in the Home  If you live with, or provide care at home for, a person confirmed to have, or being evaluated for, COVID-19 infection please follow these guidelines to prevent infection:  Follow healthcare provider's instructions Make sure that you understand and can help the patient follow any healthcare provider instructions for all care.  Provide for the patient's basic needs You should help the patient with basic needs in the home and provide support for getting groceries, prescriptions, and other personal needs.  Monitor the patient's symptoms If they are getting sicker, call his or her medical provider and tell them that the patient has, or is being evaluated  for, COVID-19 infection. This will help the healthcare provider's office take steps to keep other people from getting infected. Ask the healthcare provider to call the local or state health department.  Limit the number of people who have contact with the patient  If possible, have only one caregiver for the patient.  Other household members should stay in another home or place of residence. If this is not possible, they should stay  in another room, or be separated from the patient as much as possible.  Use a separate bathroom, if available.  Restrict visitors who do not have an essential need to be in the home.  Keep older adults, very young children, and other sick people away from the patient Keep older adults, very young children, and those who have compromised immune systems or chronic health conditions away from the patient. This includes people with chronic heart, lung, or kidney conditions, diabetes, and cancer.  Ensure good ventilation Make sure that shared spaces in the home have good air flow, such as from an air conditioner or an opened window, weather permitting.  Wash your hands often  Wash your hands often and thoroughly with soap and water for at least 20 seconds. You can use an alcohol based hand sanitizer if soap and water are not available and if your hands are not visibly dirty.  Avoid touching your eyes, nose, and mouth with unwashed hands.  Use disposable paper towels to dry your hands. If not available, use dedicated cloth towels and replace them when they become wet.  Wear a facemask and gloves  Wear a disposable facemask at all times in the room and gloves when you touch or have contact with the patient's blood, body fluids, and/or secretions or excretions, such as sweat, saliva, sputum, nasal mucus, vomit, urine, or feces.  Ensure the mask fits over your nose and mouth tightly, and do not touch it during use.  Throw out disposable facemasks and gloves after using them. Do not reuse.  Wash your hands immediately after removing your facemask and gloves.  If your personal clothing becomes contaminated, carefully remove clothing and launder. Wash your hands after handling contaminated clothing.  Place all used disposable facemasks, gloves, and other waste in a lined container before disposing them with other household waste.  Remove gloves and wash your hands immediately after handling these items.  Do not share dishes, glasses, or other household items with  the patient  Avoid sharing household items. You should not share dishes, drinking glasses, cups, eating utensils, towels, bedding, or other items with a patient who is confirmed to have, or being evaluated for, COVID-19 infection.  After the person uses these items, you should wash them thoroughly with soap and water.  Wash laundry thoroughly  Immediately remove and wash clothes or bedding that have blood, body fluids, and/or secretions or excretions, such as sweat, saliva, sputum, nasal mucus, vomit, urine, or feces, on them.  Wear gloves when handling laundry from the patient.  Read and follow directions on labels of laundry or clothing items and detergent. In general, wash and dry with the warmest temperatures recommended on the label.  Clean all areas the individual has used often  Clean all touchable surfaces, such as counters, tabletops, doorknobs, bathroom fixtures, toilets, phones, keyboards, tablets, and bedside tables, every day. Also, clean any surfaces that may have blood, body fluids, and/or secretions or excretions on them.  Wear gloves when cleaning surfaces the patient has come in contact with.  Use  a diluted bleach solution (e.g., dilute bleach with 1 part bleach and 10 parts water) or a household disinfectant with a label that says EPA-registered for coronaviruses. To make a bleach solution at home, add 1 tablespoon of bleach to 1 quart (4 cups) of water. For a larger supply, add  cup of bleach to 1 gallon (16 cups) of water.  Read labels of cleaning products and follow recommendations provided on product labels. Labels contain instructions for safe and effective use of the cleaning product including precautions you should take when applying the product, such as wearing gloves or eye protection and making sure you have good ventilation during use of the product.  Remove gloves and wash hands immediately after cleaning.  Monitor yourself for signs and symptoms of  illness Caregivers and household members are considered close contacts, should monitor their health, and will be asked to limit movement outside of the home to the extent possible. Follow the monitoring steps for close contacts listed on the symptom monitoring form.   ? If you have additional questions, contact your local health department or call the epidemiologist on call at [phone] (available 24/7). ? This guidance is subject to change. For the most up-to-date guidance from CDC, please refer to their website: YouBlogs.pl    Activity:  As tolerated   Discharge Instructions    Call MD for:  difficulty breathing, headache or visual disturbances   Complete by: As directed    Call MD for:  extreme fatigue   Complete by: As directed    Call MD for:  persistant nausea and vomiting   Complete by: As directed    Diet - low sodium heart healthy   Complete by: As directed    Diet Carb Modified   Complete by: As directed    Discharge instructions   Complete by: As directed    Follow with Primary MD  Ouida Sills, Chelsey L, DO in 1-2 weeks. Please ask your doctor to reassess whether you still require oxygen  3 weeks of isolation from 12/22/20  Please get a complete blood count and chemistry panel checked by your Primary MD at your next visit, and again as instructed by your Primary MD.  Get Medicines reviewed and adjusted: Please take all your medications with you for your next visit with your Primary MD  Laboratory/radiological data: Please request your Primary MD to go over all hospital tests and procedure/radiological results at the follow up, please ask your Primary MD to get all Hospital records sent to his/her office.  In some cases, they will be blood work, cultures and biopsy results pending at the time of your discharge. Please request that your primary care M.D. follows up on these results.  Also Note the  following: If you experience worsening of your admission symptoms, develop shortness of breath, life threatening emergency, suicidal or homicidal thoughts you must seek medical attention immediately by calling 911 or calling your MD immediately  if symptoms less severe.  You must read complete instructions/literature along with all the possible adverse reactions/side effects for all the Medicines you take and that have been prescribed to you. Take any new Medicines after you have completely understood and accpet all the possible adverse reactions/side effects.   Do not drive when taking Pain medications or sleeping medications (Benzodaizepines)  Do not take more than prescribed Pain, Sleep and Anxiety Medications. It is not advisable to combine anxiety,sleep and pain medications without talking with your primary care practitioner  Special Instructions: If you  have smoked or chewed Tobacco  in the last 2 yrs please stop smoking, stop any regular Alcohol  and or any Recreational drug use.  Wear Seat belts while driving.  Please note: You were cared for by a hospitalist during your hospital stay. Once you are discharged, your primary care physician will handle any further medical issues. Please note that NO REFILLS for any discharge medications will be authorized once you are discharged, as it is imperative that you return to your primary care physician (or establish a relationship with a primary care physician if you do not have one) for your post hospital discharge needs so that they can reassess your need for medications and monitor your lab values.   Increase activity slowly   Complete by: As directed      Allergies as of 01/01/2020      Reactions   Metformin And Related Nausea And Vomiting   Unable to tolerate 544m XR daily      Medication List    STOP taking these medications   acyclovir 400 MG tablet Commonly known as: ZOVIRAX   famotidine 20 MG tablet Commonly known as: PEPCID    naproxen 500 MG tablet Commonly known as: NAPROSYN   Ozempic (0.25 or 0.5 MG/DOSE) 2 MG/1.5ML Sopn Generic drug: Semaglutide(0.25 or 0.5MG/DOS)   TTyler AasFlexTouch 100 UNIT/ML FlexTouch Pen Generic drug: insulin degludec     TAKE these medications   albuterol 108 (90 Base) MCG/ACT inhaler Commonly known as: VENTOLIN HFA Inhale 2 puffs into the lungs every 4 (four) hours as needed for wheezing or shortness of breath.   benzonatate 200 MG capsule Commonly known as: TESSALON Take 1 capsule (200 mg total) by mouth 3 (three) times daily.   chlorpheniramine-HYDROcodone 10-8 MG/5ML Suer Commonly known as: TUSSIONEX Take 5 mLs by mouth every 12 (twelve) hours as needed for cough.   freestyle lancets 1 each by Other route See admin instructions for 30 doses. Check glucose 3 time per day and write result in logbook What changed:   how much to take  how to take this  when to take this  additional instructions   FREESTYLE TEST STRIPS test strip Generic drug: glucose blood 1 each by Other route See admin instructions. Check glucose 3 time per day and write result in logbook What changed:   how much to take  how to take this  when to take this  additional instructions   glucose monitoring kit monitoring kit 1 each by Does not apply route as needed for other.   insulin aspart 100 UNIT/ML FlexPen Commonly known as: NOVOLOG 0-20 Units, Subcutaneous, 3 times daily with meals CBG < 70: Implement Hypoglycemia measures CBG 70 - 120: 0 units CBG 121 - 150: 3 units CBG 151 - 200: 4 units CBG 201 - 250: 7 units CBG 251 - 300: 11 units CBG 301 - 350: 15 units CBG 351 - 400: 20 units CBG > 400: call MD   insulin glargine 100 UNIT/ML Solostar Pen Commonly known as: LANTUS Inject 18 Units into the skin daily. 18 units while on prednisone-go back to your usual regimen on 10 units when you complete the prednisone taper What changed:   how much to take  when to take  this  additional instructions   Insulin Pen Needle 32G X 8 MM Misc Use as directed   ondansetron 4 MG disintegrating tablet Commonly known as: Zofran ODT Take 1 tablet (4 mg total) by mouth every 8 (eight) hours as  needed for nausea or vomiting.   predniSONE 10 MG tablet Commonly known as: DELTASONE Take 40 mg daily for 1 day, 30 mg daily for 1 day, 20 mg daily for 1 days,10 mg daily for 1 day, then stop            Durable Medical Equipment  (From admission, onward)         Start     Ordered   01/01/20 0746  For home use only DME oxygen  Once       Question Answer Comment  Length of Need 6 Months   Mode or (Route) Nasal cannula   Liters per Minute 2   Frequency Continuous (stationary and portable oxygen unit needed)   Oxygen conserving device Yes   Oxygen delivery system Gas      01/01/20 0745          Follow-up Information    Anderson, Chelsey L, DO. Schedule an appointment as soon as possible for a visit in 1 week(s).   Specialty: Family Medicine Contact information: [address] [address] [phone]              Allergies  Allergen Reactions  . Metformin And Related Nausea And Vomiting    Unable to tolerate 511m XR daily      Other Procedures/Studies: DG Chest 2 View  Result Date: 12/28/2019 CLINICAL DATA:  COVID.  Cough.  Weakness.  Nausea.  Vomiting. EXAM: CHEST - 2 VIEW COMPARISON:  Prior chest radiographs 12/23/2019 and earlier. FINDINGS: Shallow inspiration radiograph. Heart size within normal limits. New from the prior examination of 12/23/2019, there are fairly extensive multifocal bilateral airspace opacities. No evidence of pleural effusion or pneumothorax. No acute bony abnormality identified. IMPRESSION: Fairly extensive multifocal bilateral airspace opacities, new as compared to the prior examination of 12/23/2019. Findings likely reflect atypical/viral pneumonia given the provided history of COVID positivity.  Electronically Signed   By: KKellie SimmeringDO   On: 12/28/2019 10:14   DG Chest Port 1 View  Result Date: 12/23/2019 CLINICAL DATA:  Cough EXAM: PORTABLE CHEST 1 VIEW COMPARISON:  October 25, 2014 FINDINGS: The cardiomediastinal silhouette is normal in contour when accounting for technique. No pleural effusion. No pneumothorax. No acute pleuroparenchymal abnormality. Visualized abdomen is unremarkable. No acute osseous abnormality noted. IMPRESSION: No acute cardiopulmonary abnormality. Electronically Signed   By: SValentino SaxonMD   On: 12/23/2019 12:04   VAS [place]LOWER EXTREMITY VENOUS (DVT)  Result Date: 12/31/2019  Lower Venous DVTStudy Indications: Swelling.  Comparison Study: No prior studies. Performing Technologist: RDarlin Coco Examination Guidelines: A complete evaluation includes B-mode imaging, spectral Doppler, color Doppler, and power Doppler as needed of all accessible portions of each vessel. Bilateral testing is considered an integral part of a complete examination. Limited examinations for reoccurring indications may be performed as noted. The reflux portion of the exam is performed with the patient in reverse Trendelenburg.  +---------+---------------+---------+-----------+----------+--------------+ RIGHT    CompressibilityPhasicitySpontaneityPropertiesThrombus Aging +---------+---------------+---------+-----------+----------+--------------+ CFV      Full           Yes      Yes                                 +---------+---------------+---------+-----------+----------+--------------+ SFJ      Full                                                        +---------+---------------+---------+-----------+----------+--------------+  FV Prox  Full                                                        +---------+---------------+---------+-----------+----------+--------------+ FV Mid   Full                                                         +---------+---------------+---------+-----------+----------+--------------+ FV DistalFull                                                        +---------+---------------+---------+-----------+----------+--------------+ PFV      Full                                                        +---------+---------------+---------+-----------+----------+--------------+ POP      Full           Yes      Yes                                 +---------+---------------+---------+-----------+----------+--------------+ PTV      Full                                                        +---------+---------------+---------+-----------+----------+--------------+ PERO     Full                                                        +---------+---------------+---------+-----------+----------+--------------+   +---------+---------------+---------+-----------+----------+--------------+ LEFT     CompressibilityPhasicitySpontaneityPropertiesThrombus Aging +---------+---------------+---------+-----------+----------+--------------+ CFV      Full           Yes      Yes                                 +---------+---------------+---------+-----------+----------+--------------+ SFJ      Full                                                        +---------+---------------+---------+-----------+----------+--------------+ FV Prox  Full                                                        +---------+---------------+---------+-----------+----------+--------------+  FV Mid   Full                                                        +---------+---------------+---------+-----------+----------+--------------+ FV DistalFull                                                        +---------+---------------+---------+-----------+----------+--------------+ PFV      Full                                                         +---------+---------------+---------+-----------+----------+--------------+ POP      Full           Yes      Yes                                 +---------+---------------+---------+-----------+----------+--------------+ PTV      Full                                                        +---------+---------------+---------+-----------+----------+--------------+ PERO     Full                                                        +---------+---------------+---------+-----------+----------+--------------+     Summary: RIGHT: - There is no evidence of deep vein thrombosis in the lower extremity.  - No cystic structure found in the popliteal fossa.  LEFT: - There is no evidence of deep vein thrombosis in the lower extremity.  - No cystic structure found in the popliteal fossa.  *See table(s) above for measurements and observations. Electronically signed by Deitra Mayo MD on 12/31/2019 at 37:37:14 AM.    Final      TODAY-DAY OF DISCHARGE:  Subjective:   [place] today has no headache,no chest abdominal pain,no new weakness tingling or numbness, feels much better wants to go home today.   Objective:   Blood pressure (!) 169/96, pulse 66, temperature 98.4 F (36.9 C), temperature source Oral, resp. rate 16, height _0  (1.549 m), weight 96.6 kg, last menstrual period 12/16/2019, SpO2 90 %. No intake or output data in the 24 hours ending 01/01/20 0924 Filed Weights   12/31/19 0600  Weight: 96.6 kg    Exam: Awake Alert, Oriented *3, No new F.N deficits, Normal affect [place].AT,PERRAL Supple Neck,No JVD, No cervical lymphadenopathy appriciated.  Symmetrical Chest wall movement, Good air movement bilaterally, CTAB RRR,No Gallops,Rubs or new Murmurs, No Parasternal Heave +ve B.Sounds, Abd Soft, Non tender, No organomegaly appriciated, No rebound -guarding or rigidity. No Cyanosis, Clubbing or edema, No new Rash or bruise  PERTINENT RADIOLOGIC STUDIES: DG Chest 2  View  Result Date: 12/28/2019 CLINICAL DATA:  COVID.  Cough.  Weakness.  Nausea.  Vomiting. EXAM: CHEST - 2 VIEW COMPARISON:  Prior chest radiographs 12/23/2019 and earlier. FINDINGS: Shallow inspiration radiograph. Heart size within normal limits. New from the prior examination of 12/23/2019, there are fairly extensive multifocal bilateral airspace opacities. No evidence of pleural effusion or pneumothorax. No acute bony abnormality identified. IMPRESSION: Fairly extensive multifocal bilateral airspace opacities, new as compared to the prior examination of 12/23/2019. Findings likely reflect atypical/viral pneumonia given the provided history of COVID positivity. Electronically Signed   By: Kellie Simmering DO   On: 12/28/2019 10:14   DG Chest Port 1 View  Result Date: 12/23/2019 CLINICAL DATA:  Cough EXAM: PORTABLE CHEST 1 VIEW COMPARISON:  October 25, 2014 FINDINGS: The cardiomediastinal silhouette is normal in contour when accounting for technique. No pleural effusion. No pneumothorax. No acute pleuroparenchymal abnormality. Visualized abdomen is unremarkable. No acute osseous abnormality noted. IMPRESSION: No acute cardiopulmonary abnormality. Electronically Signed   By: Valentino Saxon MD   On: 12/23/2019 12:04   VAS [place] LOWER EXTREMITY VENOUS (DVT)  Result Date: 12/31/2019  Lower Venous DVTStudy Indications: Swelling.  Comparison Study: No prior studies. Performing Technologist: Darlin Coco  Examination Guidelines: A complete evaluation includes B-mode imaging, spectral Doppler, color Doppler, and power Doppler as needed of all accessible portions of each vessel. Bilateral testing is considered an integral part of a complete examination. Limited examinations for reoccurring indications may be performed as noted. The reflux portion of the exam is performed with the patient in reverse Trendelenburg.  +---------+---------------+---------+-----------+----------+--------------+ RIGHT     CompressibilityPhasicitySpontaneityPropertiesThrombus Aging +---------+---------------+---------+-----------+----------+--------------+ CFV      Full           Yes      Yes                                 +---------+---------------+---------+-----------+----------+--------------+ SFJ      Full                                                        +---------+---------------+---------+-----------+----------+--------------+ FV Prox  Full                                                        +---------+---------------+---------+-----------+----------+--------------+ FV Mid   Full                                                        +---------+---------------+---------+-----------+----------+--------------+ FV DistalFull                                                        +---------+---------------+---------+-----------+----------+--------------+ PFV      Full                                                        +---------+---------------+---------+-----------+----------+--------------+  POP      Full           Yes      Yes                                 +---------+---------------+---------+-----------+----------+--------------+ PTV      Full                                                        +---------+---------------+---------+-----------+----------+--------------+ PERO     Full                                                        +---------+---------------+---------+-----------+----------+--------------+   +---------+---------------+---------+-----------+----------+--------------+ LEFT     CompressibilityPhasicitySpontaneityPropertiesThrombus Aging +---------+---------------+---------+-----------+----------+--------------+ CFV      Full           Yes      Yes                                 +---------+---------------+---------+-----------+----------+--------------+ SFJ      Full                                                         +---------+---------------+---------+-----------+----------+--------------+ FV Prox  Full                                                        +---------+---------------+---------+-----------+----------+--------------+ FV Mid   Full                                                        +---------+---------------+---------+-----------+----------+--------------+ FV DistalFull                                                        +---------+---------------+---------+-----------+----------+--------------+ PFV      Full                                                        +---------+---------------+---------+-----------+----------+--------------+ POP      Full           Yes      Yes                                 +---------+---------------+---------+-----------+----------+--------------+  PTV      Full                                                        +---------+---------------+---------+-----------+----------+--------------+ PERO     Full                                                        +---------+---------------+---------+-----------+----------+--------------+     Summary: RIGHT: - There is no evidence of deep vein thrombosis in the lower extremity.  - No cystic structure found in the popliteal fossa.  LEFT: - There is no evidence of deep vein thrombosis in the lower extremity.  - No cystic structure found in the popliteal fossa.  *See table(s) above for measurements and observations. Electronically signed by Deitra Mayo MD on 12/31/2019 at 6:37:14 AM.    Final      PERTINENT LAB RESULTS: CBC: Recent Labs    12/31/19 0139 01/01/20 0142  WBC 4.0 3.5*  HGB 9.6* 9.6*  HCT 32.0* 31.9*  PLT 264 285   CMET CMP     Component Value Date/Time   NA 135 01/01/2020 0142   NA 134 05/20/2017 0915   K 3.7 01/01/2020 0142   CL 104 01/01/2020 0142   CO2 22 01/01/2020 0142   GLUCOSE 338 (H) 01/01/2020 0142   BUN 16  01/01/2020 0142   BUN 9 05/20/2017 0915   CREATININE 0.73 01/01/2020 0142   CREATININE 0.53 05/15/2014 1455   CALCIUM 8.3 (L) 01/01/2020 0142   PROT 6.2 (L) 01/01/2020 0142   ALBUMIN 2.7 (L) 01/01/2020 0142   AST 21 01/01/2020 0142   ALT 24 01/01/2020 0142   ALKPHOS 64 01/01/2020 0142   BILITOT 0.1 (L) 01/01/2020 0142   GFRNONAA >60 01/01/2020 0142   GFRAA >60 01/01/2020 0142    GFR Estimated Creatinine Clearance: 107.3 mL/min (by C-G formula based on SCr of 0.73 mg/dL). No results for input(s): LIPASE, AMYLASE in the last 72 hours. No results for input(s): CKTOTAL, CKMB, CKMBINDEX, TROPONINI in the last 72 hours. Invalid input(s): POCBNP Recent Labs    12/31/19 0139 01/01/20 0142  DDIMER 0.63* 0.59*   No results for input(s): HGBA1C in the last 72 hours. No results for input(s): CHOL, HDL, LDLCALC, TRIG, CHOLHDL, LDLDIRECT in the last 72 hours. No results for input(s): TSH, T4TOTAL, T3FREE, THYROIDAB in the last 72 hours.  Invalid input(s): FREET3 No results for input(s): VITAMINB12, FOLATE, FERRITIN, TIBC, IRON, RETICCTPCT in the last 72 hours. Coags: No results for input(s): INR in the last 72 hours.  Invalid input(s): PT Microbiology: Recent Results (from the past 240 hour(s))  SARS Coronavirus 2 by RT PCR (hospital order, performed in [facility] hospital lab) Nasopharyngeal Nasopharyngeal Swab     Status: Abnormal   Collection Time: 12/23/19 11:45 AM   Specimen: Nasopharyngeal Swab  Result Value Ref Range Status   SARS Coronavirus 2 POSITIVE (A) NEGATIVE Final    Comment: RESULT CALLED TO, READ BACK BY AND VERIFIED WITH: S.LEONARD RN AT [id number] ON 12/23/19 BY S.VANHOORNE Performed at [facility], [place] [address] [place], [place] [address]   Urine culture  Status: Abnormal   Collection Time: 12/28/19  3:31 PM   Specimen: Urine, Random  Result Value Ref Range Status   Specimen Description URINE, RANDOM  Final   Special Requests   Final     NONE Performed at [facility], [address], [place], [place] [address]    Culture >=100,000 COLONIES/mL ESCHERICHIA COLI (A)  Final   Report Status 12/30/2019 FINAL  Final   Organism ID, Bacteria ESCHERICHIA COLI (A)  Final      Susceptibility   Escherichia coli - MIC*    AMPICILLIN >=32 RESISTANT Resistant     CEFAZOLIN <=4 SENSITIVE Sensitive     CEFTRIAXONE <=0.25 SENSITIVE Sensitive     CIPROFLOXACIN <=0.25 SENSITIVE Sensitive     GENTAMICIN <=1 SENSITIVE Sensitive     IMIPENEM <=0.25 SENSITIVE Sensitive     NITROFURANTOIN <=16 SENSITIVE Sensitive     TRIMETH/SULFA <=20 SENSITIVE Sensitive     AMPICILLIN/SULBACTAM <=2 SENSITIVE Sensitive     PIP/TAZO <=4 SENSITIVE Sensitive     * >=100,000 COLONIES/mL ESCHERICHIA COLI  Culture, blood (Routine X 2) w Reflex to ID Panel     Status: None (Preliminary result)   Collection Time: 12/28/19 10:00 PM   Specimen: BLOOD  Result Value Ref Range Status   Specimen Description BLOOD SITE NOT SPECIFIED  Final   Special Requests   Final    BOTTLES DRAWN AEROBIC AND ANAEROBIC Blood Culture results may not be optimal due to an inadequate volume of blood received in culture bottles   Culture   Final    NO GROWTH 3 DAYS Performed at [facility], [place] [address] [place], [place] [address]    Report Status PENDING  Incomplete    FURTHER DISCHARGE INSTRUCTIONS:  Get Medicines reviewed and adjusted: Please take all your medications with you for your next visit with your Primary MD  Laboratory/radiological data: Please request your Primary MD to go over all hospital tests and procedure/radiological results at the follow up, please ask your Primary MD to get all Hospital records sent to his/her office.  In some cases, they will be blood work, cultures and biopsy results pending at the time of your discharge. Please request that your primary care M.D. goes through all the records of your hospital data and follows up on these  results.  Also Note the following: If you experience worsening of your admission symptoms, develop shortness of breath, life threatening emergency, suicidal or homicidal thoughts you must seek medical attention immediately by calling 911 or calling your MD immediately  if symptoms less severe.  You must read complete instructions/literature along with all the possible adverse reactions/side effects for all the Medicines you take and that have been prescribed to you. Take any new Medicines after you have completely understood and accpet all the possible adverse reactions/side effects.   Do not drive when taking Pain medications or sleeping medications (Benzodaizepines)  Do not take more than prescribed Pain, Sleep and Anxiety Medications. It is not advisable to combine anxiety,sleep and pain medications without talking with your primary care practitioner  Special Instructions: If you have smoked or chewed Tobacco  in the last 2 yrs please stop smoking, stop any regular Alcohol  and or any Recreational drug use.  Wear Seat belts while driving.  Please note: You were cared for by a hospitalist during your hospital stay. Once you are discharged, your primary care physician will handle any further medical issues. Please note that NO REFILLS for  any discharge medications will be authorized once you are discharged, as it is imperative that you return to your primary care physician (or establish a relationship with a primary care physician if you do not have one) for your post hospital discharge needs so that they can reassess your need for medications and monitor your lab values.  Total Time spent coordinating discharge including counseling, education and face to face time equals 35 minutes.  SignedOren Binet 01/01/2020 9:24 AM

## 2020-01-02 LAB — CULTURE, BLOOD (ROUTINE X 2): Culture: NO GROWTH

## 2020-01-04 ENCOUNTER — Ambulatory Visit: Payer: Self-pay | Admitting: Student in an Organized Health Care Education/Training Program

## 2020-01-17 ENCOUNTER — Ambulatory Visit: Payer: Self-pay | Admitting: Student in an Organized Health Care Education/Training Program

## 2020-03-03 ENCOUNTER — Ambulatory Visit
Admission: RE | Admit: 2020-03-03 | Discharge: 2020-03-03 | Disposition: A | Discharge status: Home / Self Care | Payer: Self-pay | Source: Ambulatory Visit | Attending: Nurse Practitioner | Admitting: Nurse Practitioner

## 2020-03-03 ENCOUNTER — Other Ambulatory Visit: Payer: Self-pay | Admitting: Student in an Organized Health Care Education/Training Program

## 2020-03-03 ENCOUNTER — Other Ambulatory Visit: Payer: Self-pay

## 2020-03-03 DIAGNOSIS — Z1231 Encounter for screening mammogram for malignant neoplasm of breast: Secondary | ICD-10-CM

## 2020-03-14 ENCOUNTER — Ambulatory Visit (INDEPENDENT_AMBULATORY_CARE_PROVIDER_SITE_OTHER): Payer: Medicaid Other | Admitting: Student in an Organized Health Care Education/Training Program

## 2020-03-14 ENCOUNTER — Encounter: Payer: Self-pay | Admitting: Student in an Organized Health Care Education/Training Program

## 2020-03-14 ENCOUNTER — Other Ambulatory Visit: Payer: Self-pay

## 2020-03-14 VITALS — BP 104/62 | HR 92 | Ht 61.0 in | Wt 208.2 lb

## 2020-03-14 DIAGNOSIS — Z8742 Personal history of other diseases of the female genital tract: Secondary | ICD-10-CM | POA: Diagnosis not present

## 2020-03-14 DIAGNOSIS — E1140 Type 2 diabetes mellitus with diabetic neuropathy, unspecified: Secondary | ICD-10-CM | POA: Insufficient documentation

## 2020-03-14 DIAGNOSIS — G6289 Other specified polyneuropathies: Secondary | ICD-10-CM | POA: Diagnosis present

## 2020-03-14 DIAGNOSIS — N898 Other specified noninflammatory disorders of vagina: Secondary | ICD-10-CM

## 2020-03-14 DIAGNOSIS — Z794 Long term (current) use of insulin: Secondary | ICD-10-CM

## 2020-03-14 MED ORDER — GABAPENTIN 300 MG PO CAPS: 300.0000 mg | ORAL_CAPSULE | Freq: Every day | ORAL | 0 refills | Status: DC

## 2020-03-14 NOTE — Assessment & Plan Note (Signed)
Symptoms suspicious for peripheral neuropathy in uncontrolled diabetic.  Discussed tighter control of blood sugars with increasing insulin 2u daily with goal less than 120 fasting consistently. Will recheck in 1 month

## 2020-03-14 NOTE — Patient Instructions (Signed)
It was a pleasure to see you today!  To summarize our discussion for this visit:  We need to treat your diabetes to help improve your other symptoms like neuropathy and frequent yeast infections. Please increase your insulin up 2 units per day until your morning fasting blood sugars are between 70-120 consistently. Please let me know what unit you end up at.  I will prescribe a suppository for the yeast infection. If not improved, we will need to do an exam.  I would recommend a birth control option for your menstrual cycles and we can discuss your choice at next appointment  I'm prescribing gabapentin to take at night to help with the leg pain.   Some additional health maintenance measures we should update are: Health Maintenance Due  Topic Date Due  . Hepatitis C Screening  Never done  . PNEUMOCOCCAL POLYSACCHARIDE VACCINE AGE 39-64 HIGH RISK  Never done  . OPHTHALMOLOGY EXAM  08/03/2014  . FOOT EXAM  02/10/2017  . URINE MICROALBUMIN  11/05/2019  . INFLUENZA VACCINE  12/11/2019  . PAP SMEAR-Modifier  12/28/2019  .   Please return to our clinic to see me in about a month.  Call the clinic at [phone] if your symptoms worsen or you have any concerns.   Thank you for allowing me to take part in your care,  Dr. Jamelle Rushing   Peripheral Neuropathy Peripheral neuropathy is a type of nerve damage. It affects nerves that carry signals between the spinal cord and the arms, legs, and the rest of the body (peripheral nerves). It does not affect nerves in the spinal cord or brain. In peripheral neuropathy, one nerve or a group of nerves may be damaged. Peripheral neuropathy is a broad category that includes many specific nerve disorders, like diabetic neuropathy, hereditary neuropathy, and carpal tunnel syndrome. What are the causes? This condition may be caused by:  Diabetes. This is the most common cause of peripheral neuropathy.  Nerve injury.  Pressure or stress on a nerve  that lasts a long time.  Lack (deficiency) of B vitamins. This can result from alcoholism, poor diet, or a restricted diet.  Infections.  Autoimmune diseases, such as rheumatoid arthritis and systemic lupus erythematosus.  Nerve diseases that are passed from parent to child (inherited).  Some medicines, such as cancer medicines (chemotherapy).  Poisonous (toxic) substances, such as lead and mercury.  Too little blood flowing to the legs.  Kidney disease.  Thyroid disease. In some cases, the cause of this condition is not known. What are the signs or symptoms? Symptoms of this condition depend on which of your nerves is damaged. Common symptoms include:  Loss of feeling (numbness) in the feet, hands, or both.  Tingling in the feet, hands, or both.  Burning pain.  Very sensitive skin.  Weakness.  Not being able to move a part of the body (paralysis).  Muscle twitching.  Clumsiness or poor coordination.  Loss of balance.  Not being able to control your bladder.  Feeling dizzy.  Sexual problems. How is this diagnosed? Diagnosing and finding the cause of peripheral neuropathy can be difficult. Your health care provider will take your medical history and do a physical exam. A neurological exam will also be done. This involves checking things that are affected by your brain, spinal cord, and nerves (nervous system). For example, your health care provider will check your reflexes, how you move, and what you can feel. You may have other tests, such as:  Blood  tests.  Electromyogram (EMG) and nerve conduction tests. These tests check nerve function and how well the nerves are controlling the muscles.  Imaging tests, such as CT scans or MRI to rule out other causes of your symptoms.  Removing a small piece of nerve to be examined in a lab (nerve biopsy). This is rare.  Removing and examining a small amount of the fluid that surrounds the brain and spinal cord (lumbar  puncture). This is rare. How is this treated? Treatment for this condition may involve:  Treating the underlying cause of the neuropathy, such as diabetes, kidney disease, or vitamin deficiencies.  Stopping medicines that can cause neuropathy, such as chemotherapy.  Medicine to relieve pain. Medicines may include: ? Prescription or over-the-counter pain medicine. ? Antiseizure medicine. ? Antidepressants. ? Pain-relieving patches that are applied to painful areas of skin.  Surgery to relieve pressure on a nerve or to destroy a nerve that is causing pain.  Physical therapy to help improve movement and balance.  Devices to help you move around (assistive devices). Follow these instructions at home: Medicines  Take over-the-counter and prescription medicines only as told by your health care provider. Do not take any other medicines without first asking your health care provider.  Do not drive or use heavy machinery while taking prescription pain medicine. Lifestyle   Do not use any products that contain nicotine or tobacco, such as cigarettes and e-cigarettes. Smoking keeps blood from reaching damaged nerves. If you need help quitting, ask your health care provider.  Avoid or limit alcohol. Too much alcohol can cause a vitamin B deficiency, and vitamin B is needed for healthy nerves.  Eat a healthy diet. This includes: ? Eating foods that are high in fiber, such as fresh fruits and vegetables, whole grains, and beans. ? Limiting foods that are high in fat and processed sugars, such as fried or sweet foods. General instructions   If you have diabetes, work closely with your health care provider to keep your blood sugar under control.  If you have numbness in your feet: ? Check every day for signs of injury or infection. Watch for redness, warmth, and swelling. ? Wear padded socks and comfortable shoes. These help protect your feet.  Develop a good support system. Living with  peripheral neuropathy can be stressful. Consider talking with a mental health specialist or joining a support group.  Use assistive devices and attend physical therapy as told by your health care provider. This may include using a walker or a cane.  Keep all follow-up visits as told by your health care provider. This is important. Contact a health care provider if:  You have new signs or symptoms of peripheral neuropathy.  You are struggling emotionally from dealing with peripheral neuropathy.  Your pain is not well-controlled. Get help right away if:  You have an injury or infection that is not healing normally.  You develop new weakness in an arm or leg.  You fall frequently. Summary  Peripheral neuropathy is when the nerves in the arms, or legs are damaged, resulting in numbness, weakness, or pain.  There are many causes of peripheral neuropathy, including diabetes, pinched nerves, vitamin deficiencies, autoimmune disease, and hereditary conditions.  Diagnosing and finding the cause of peripheral neuropathy can be difficult. Your health care provider will take your medical history, do a physical exam, and do tests, including blood tests and nerve function tests.  Treatment involves treating the underlying cause of the neuropathy and taking medicines  to help control pain. Physical therapy and assistive devices may also help. This information is not intended to replace advice given to you by your health care provider. Make sure you discuss any questions you have with your health care provider. Document Revised: 04/10/2017 Document Reviewed: 07/07/2016 Elsevier Patient Education  2020 [employer].

## 2020-03-14 NOTE — Progress Notes (Signed)
   SUBJECTIVE:   CHIEF COMPLAINT / HPI: leg pain, heavy periods, yeast infection  Leg pain- a week and a half started having burning and tingling in both legs when she woke up. 10/10 at night and 5/10 during the day. Endorses some numbness/weakness. Denies any injuries or heavy lifting. No back pain. Has not had something like this happen before.  Has been taking diabetes medications as prescribed. Blood sugars have been around 104-203. No rash or itch. hgb A1c 11.3 12/28/19  Menstrual cycles- 4-7days. Occurs about once a month. Associated with pelvic cramps. Goes through a pad every hour or so sometimes for a day or two. Has not changed recently. Has h/o BTL. Has not been on birth control for >5 years, is infrequently sexually active with her husband.  Does not smoke.   Yeast infection rx- vaginal discharge and burning. Clumpy white discharge. Tried OTC Suppository with mild improvement.   OBJECTIVE:   BP 104/62   Pulse 92   Ht 5\' 1"  (1.549 m)   Wt 208 lb 3.2 oz (94.4 kg)   LMP 03/02/2020 (Approximate)   SpO2 100%   BMI 39.34 kg/m   General: NAD, pleasant, able to participate in exam Extremities: no edema. WWP. Skin: warm and dry, no rashes noted Neuro: alert and oriented, no focal deficits Psych: Normal affect and mood  ASSESSMENT/PLAN:   Type 2 diabetes mellitus with diabetic neuropathy, unspecified (HCC) Symptoms suspicious for peripheral neuropathy in uncontrolled diabetic.  Discussed tighter control of blood sugars with increasing insulin 2u daily with goal less than 120 fasting consistently. Will recheck in 1 month  Hx of menorrhagia Continue to follow with OB/GYN  Vaginal discharge Patient declined a vaginal exam today.  Prescribed vaginal suppository anti-candidal agent Must return for an exam and wet prep for further treatment if not resolved. Patient aware and agrees.     03/04/2020, DO [facility]

## 2020-03-15 DIAGNOSIS — Z8742 Personal history of other diseases of the female genital tract: Secondary | ICD-10-CM | POA: Insufficient documentation

## 2020-03-15 MED ORDER — MICONAZOLE NITRATE 200 MG VA SUPP: 200.0000 mg | Freq: Every day | VAGINAL | 0 refills | Status: DC

## 2020-03-15 NOTE — Assessment & Plan Note (Addendum)
Patient declined a vaginal exam today.  Prescribed vaginal suppository anti-candidal agent Must return for an exam and wet prep for further treatment if not resolved. Patient aware and agrees.

## 2020-03-15 NOTE — Assessment & Plan Note (Signed)
Continue to follow with OBGYN

## 2020-03-21 ENCOUNTER — Other Ambulatory Visit: Payer: Self-pay

## 2020-03-21 ENCOUNTER — Encounter: Payer: Self-pay | Admitting: Family Medicine

## 2020-03-21 ENCOUNTER — Ambulatory Visit (INDEPENDENT_AMBULATORY_CARE_PROVIDER_SITE_OTHER): Payer: Medicaid Other | Admitting: Family Medicine

## 2020-03-21 VITALS — BP 104/60 | HR 91 | Wt 209.8 lb

## 2020-03-21 DIAGNOSIS — B379 Candidiasis, unspecified: Secondary | ICD-10-CM | POA: Diagnosis not present

## 2020-03-21 DIAGNOSIS — F339 Major depressive disorder, recurrent, unspecified: Secondary | ICD-10-CM | POA: Diagnosis not present

## 2020-03-21 DIAGNOSIS — Z23 Encounter for immunization: Secondary | ICD-10-CM | POA: Diagnosis not present

## 2020-03-21 DIAGNOSIS — B009 Herpesviral infection, unspecified: Secondary | ICD-10-CM | POA: Diagnosis not present

## 2020-03-21 MED ORDER — TERCONAZOLE 0.4 % VA CREA: 1.0000 | TOPICAL_CREAM | Freq: Every day | VAGINAL | 6 refills | Status: DC

## 2020-03-21 MED ORDER — VALACYCLOVIR HCL 500 MG PO TABS: 500.0000 mg | ORAL_TABLET | Freq: Two times a day (BID) | ORAL | 6 refills | Status: AC

## 2020-03-21 MED ORDER — FLUCONAZOLE 150 MG PO TABS: 150.0000 mg | ORAL_TABLET | Freq: Once | ORAL | 0 refills | Status: AC

## 2020-03-21 NOTE — Patient Instructions (Addendum)
I sent in the diflucan pill to treat this yeast outbreak.  Future outbreaks can be treated with the terazol cream.   I sent in refills on the valtrex.  It is important to start the medicine ASAP when the symptoms start.   Someone should call to set up counseling.

## 2020-03-21 NOTE — Assessment & Plan Note (Signed)
Patient's yeast infection is not responding to OTC creams and is worsening symptoms. Prescribed fluconazole to take today. Prescribed terconazole for future infections to be applied externally.

## 2020-03-21 NOTE — Assessment & Plan Note (Signed)
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 Prescribed refills for future outbreaks to ensure as quick a response to initial outbreak as possible.

## 2020-03-21 NOTE — Progress Notes (Signed)
    SUBJECTIVE:   CHIEF COMPLAINT / HPI:   [name] [name] (MRN: [id number]) is a 34 y.o. female with a history of yeast infections, HSV, and T2DM who presents with active yeast infection and HSV outbreak.  Yeast infection Patient states her yeast infection began internally 3 days ago. She used suppositories to clear it up, but it spread externally over time. She states the pain from the infection is 10/10 and describes it as burning and itching. She has tried OTC cream from [facility], but that has made the burning worse and has not helped the infection. She states she was given another medication at the hospital that worked, and she would like that again.  HSV outbreak She says that the HSV outbreak occurred two days ago. She began taking Valtrex immediately, but it has not helped yet. She states it has taken 3-4 days to help in the past, so she is just waiting for the medication to take effect. She has been taking Tylenol as needed for pain, and it has helped some.  Depression Patient states she has been dealing with a lot of stress lately with the pandemic, her job, paying bills, and taking care of her family while she is in this pain. She says she has also not been able to sleep much at night due to over-thinking. She has been talking with her family about how she is feeling, but she is amenable to speaking with a mental health counselor.  Health maintenance She wants to get her flu and pneumococcal vaccines today while she is in the office.  PERTINENT  PMH / PSH: recurrent yeast infections, HSV, T2DM  OBJECTIVE:   BP 104/60   Pulse 91   Wt 209 lb 12.8 oz (95.2 kg)   LMP 03/02/2020 (Approximate)   SpO2 99%   BMI 39.64 kg/m    PHYSICAL EXAM  GEN: well developed, well-nourished, in NAD, yawning throughout the encounter EXT: Moves all extremities equally   PSY: PHQ-9 score of 19.  ASSESSMENT/PLAN:   Yeast infection Patient's yeast infection is not responding to OTC creams  and is worsening symptoms. Prescribed fluconazole to take today. Prescribed terconazole for future infections to be applied externally.   Herpes simplex virus (HSV) infection Patient has been taking Valtrex as prescribed after noticing HSV outbreak. Prescribed refills for future outbreaks to ensure as quick a response to initial outbreak as possible.  Depression, recurrent (HCC) Patient scored 19 on PHQ-9 and is amenable to speaking with a mental health counselor. Referred to [facility] mental health counseling for further evaluation and treatment.   Health maintenance Patient asked for flu and pneumococcal vaccines. Both vaccines administered today.  Samantha Crimes, Medical Student [facility]

## 2020-03-21 NOTE — Assessment & Plan Note (Signed)
Patient scored 19 on PHQ-9 and is amenable to speaking with a mental health counselor. Referred to [facility] mental health counseling for further evaluation and treatment.

## 2020-03-22 ENCOUNTER — Encounter: Payer: Self-pay | Admitting: Family Medicine

## 2020-03-22 ENCOUNTER — Telehealth: Payer: Self-pay | Admitting: *Deleted

## 2020-03-22 NOTE — Progress Notes (Signed)
Brief synopsis. 1. Recurrent herpes simplex.  Valtrex Rx appropriate.  I wrote a refillable prescription so that she can begin with early symptoms and not need to wait for a refill from our office. 2. Recurrent presumed candida vaginitis.  Diabetic with poor control.  Last A1C=11.  Puts her at risk.  States she gets a couple of infections per month.  Will treat with diflucan now and send refillable prescription for Terazol.  I do not want repeated diflucan for concerns about liver toxicity. 3. Depression. High PHQ9.  Wants counseling.  Does not want meds at this point.  FU 1-2 months with PCP to focus on DM control.

## 2020-03-22 NOTE — Chronic Care Management (AMB) (Signed)
  Care Management   Note  03/22/2020 Name: [name] [name] MRN: [id number] DOB: [birth year]  [name] is a 34 y.o. year old female who is a primary care patient of Leeroy Bock, DO. I reached out to Serena Colonel by phone today in response to a referral sent by [name] health plan.    Ms. Arvanitis was given information about care management services today including:  1. Care management services include personalized support from designated clinical staff supervised by her physician, including individualized plan of care and coordination with other care providers 2. 24/7 contact phone numbers for assistance for urgent and routine care needs. 3. The patient may stop care management services at any time by phone call to the office staff.  Patient agreed to services and verbal consent obtained.   Follow up plan: Telephone appointment with care management team member scheduled for:03/30/2020  [facility] Guide, Embedded Care Coordination [facility]  [place], [place] [address] Direct Dial: [phone]

## 2020-03-30 ENCOUNTER — Ambulatory Visit: Payer: Medicaid Other | Admitting: Licensed Clinical Social Worker

## 2020-03-30 DIAGNOSIS — R4589 Other symptoms and signs involving emotional state: Secondary | ICD-10-CM

## 2020-03-30 DIAGNOSIS — Z7189 Other specified counseling: Secondary | ICD-10-CM

## 2020-03-30 NOTE — Chronic Care Management (AMB) (Signed)
Care Management   Initial Clinical Social Work General Note  03/30/2020 Name: [name] [name] MRN: [id number] DOB: May 22, 1985  [name] [name] is enrolled in a Managed Medicaid plan: Yes. Outreach attempt today was successful.   [name] [name] is a 34 y.o. year old female who is a primary care patient of Richarda Osmond, DO. The Care Management team was consulted to assist the patient with Mental Health Counseling and Resources.   Assessment: Patient is experiencing symptoms of depression which seems to be exacerbated when others make comments about how she is raising her children. History of depression. Counseling years ago when she was younger.  Has been managing for years without medication or therapy. Recent life changes: none reported;  Recommendation: Patient may benefit from, and is in agreement to work with LCSW to locate a therapist.   SDOH (Social Determinants of Health) screening performed today: Depression  . See Care Plan for related entries.  SDOH Interventions     Most Recent Value  SDOH Interventions  SDOH Interventions for the Following Domains Depression  Stress Interventions Provide Counseling  Depression Interventions/Treatment  Counseling  [discussed long-term counseling options]      Advanced Directives Status: not addressed in this encounter   Patient Care Plan: General Social Work (Adult)  Problem Identified: Depression Identification (Depression)   Goal: Reduce and manage symptoms   Start Date: 03/30/2020  Expected End Date: 06/11/2020  Priority: High  Objective: PHQ-9 score of 19 is an indication of severe symptoms of depression. Depression screen [facility] 2/9 03/21/2020 03/14/2020 03/14/2019  Decreased Interest 3 - 0  Down, Depressed, Hopeless 3 2 0  PHQ - 2 Score 6 2 0  Altered sleeping 2 3 -  Tired, decreased energy 3 3 -  Change in appetite 3 2 -  Feeling bad or failure about yourself  2 2 -  Trouble concentrating 2 0 -  Moving slowly or  fidgety/restless 1 1 -  Suicidal thoughts 0 0 -  PHQ-9 Score 19 13 -  Difficult doing work/chores - Somewhat difficult -      Current Barriers:   . Lacks knowledge of where to start for counseling. Needs Support, Education, and Care Coordination in order to meet unmet need  Clinical Goal(s): Over the next 30 days, patient will work with SW to reduce or manage symptoms of depression until connected for ongoing counseling.  Interventions:  . Assessed patient's previous treatment, needs, coping skills, current treatment, support system and barriers to care . Provided basic mental health support, education and interventions ( EEMI education :Breathing to relax and Relieving stress) . Discussed several options for long term counseling based on need and insurance. E-mailed list of therapist for patient to review . Other interventions include: Motivational Interviewing Mindfulness or Psychologist, educational ;Psychoeducation and/or Health Education    Patient Goals/Self-Care Activities: Over the next 14 days . implement interventions discussed today to decreases symptoms of depression and stress and increase knowledge and/or ability of: coping skills, self-management skills, and stress reduction. . select one of the agencies from the list provided and call to schedule an appointment  . Review EMMI educational information (Breathing to relax and Relieving stress) Remember this will come from [facility] . Relaxed breathing 3 times a day Follow Up Plan:  Marland Kitchen Appointment scheduled for SW follow up with client by phone on:  Dec. 3rd at 9:00     Outpatient Encounter Medications as of 03/30/2020  Medication Sig  . albuterol (VENTOLIN  HFA) 108 (90 Base) MCG/ACT inhaler Inhale 2 puffs into the lungs every 4 (four) hours as needed for wheezing or shortness of breath.  . gabapentin (NEURONTIN) 300 MG capsule Take 1 capsule (300 mg total) by mouth at bedtime.  Marland Kitchen glucose blood (FREESTYLE TEST  STRIPS) test strip 1 each by Other route See admin instructions. Check glucose 3 time per day and write result in logbook  . glucose monitoring kit (FREESTYLE) monitoring kit 1 each by Does not apply route as needed for other.  . insulin aspart (NOVOLOG) 100 UNIT/ML FlexPen 0-20 Units, Subcutaneous, 3 times daily with meals CBG < 70: Implement Hypoglycemia measures CBG 70 - 120: 0 units CBG 121 - 150: 3 units CBG 151 - 200: 4 units CBG 201 - 250: 7 units CBG 251 - 300: 11 units CBG 301 - 350: 15 units CBG 351 - 400: 20 units CBG > 400: call MD  . insulin glargine (LANTUS) 100 UNIT/ML Solostar Pen Inject 18 Units into the skin daily. 18 units while on prednisone-go back to your usual regimen on 10 units when you complete the prednisone taper  . Insulin Pen Needle 32G X 8 MM MISC Use as directed  . Lancets (FREESTYLE) lancets 1 each by Other route See admin instructions for 30 doses. Check glucose 3 time per day and write result in logbook  . ondansetron (ZOFRAN ODT) 4 MG disintegrating tablet Take 1 tablet (4 mg total) by mouth every 8 (eight) hours as needed for nausea or vomiting.  Marland Kitchen terconazole (TERAZOL 7) 0.4 % vaginal cream Place 1 applicator vaginally at bedtime.   No facility-administered encounter medications on file as of 03/30/2020.   [name] [name] was given information about Care Management services today including:  1. Care Management services include personalized support from designated clinical staff supervised by her physician, including individualized plan of care and coordination with other care providers 2. 24/7 contact phone numbers for assistance for urgent and routine care needs. 3. The patient may stop care management services at any time (effective at the end of the month) by phone call to the office staff.  Patient agreed to services and verbal consent obtained.   Casimer Lanius, [place]    [phone] 9:57 AM

## 2020-03-30 NOTE — Patient Instructions (Signed)
  Ms. Twardowski  it was nice speaking with you. Please call me directly [phone] if you have questions about the goals we discussed. Goals Addressed            This Visit's Progress   . Manage Sysmptoms of Depression       Patient Goals/Self-Care Activities: Over the next 14 days . implement interventions discussed today to decreases symptoms of depression and stress and increase knowledge and/or ability of: coping skills, self-management skills, and stress reduction. . select one of the agencies from the list provided and call to schedule an appointment  . Review EMMI educational information (Breathing to relax and Relieving stress) Remember this will come from [facility] . Relaxed breathing 3 times a day      Ms. Paganelli received Care Management services today:  1. Care Management services include personalized support from designated clinical staff supervised by her physician, including individualized plan of care and coordination with other care providers 2. 24/7 contact [phone] for assistance for urgent and routine care needs. 3. Care Management are voluntary services and be declined at any time by calling the office. Patient verbalizes understanding of instructions provided today.    Follow up plan: SW will follow up with patient by phone  Dec. 3rd at 9:00 Soundra Pilon, LCSW

## 2020-04-02 ENCOUNTER — Other Ambulatory Visit: Payer: Self-pay

## 2020-04-02 DIAGNOSIS — E119 Type 2 diabetes mellitus without complications: Secondary | ICD-10-CM

## 2020-04-03 MED ORDER — INSULIN GLARGINE 100 UNIT/ML SOLOSTAR PEN: 18.0000 [IU] | PEN_INJECTOR | Freq: Every day | SUBCUTANEOUS | 0 refills | Status: DC

## 2020-04-03 MED ORDER — INSULIN ASPART 100 UNIT/ML FLEXPEN: PEN_INJECTOR | SUBCUTANEOUS | 0 refills | Status: DC

## 2020-04-13 IMAGING — US US ABDOMEN LIMITED
1 series · 14 of 25 positions shown · non-contrast
Comparison: No prior.

CLINICAL DATA: Right upper quadrant pain.

EXAM:
ULTRASOUND ABDOMEN LIMITED RIGHT UPPER QUADRANT

[Series 1: us abdomen limited · 14 of 29 slices shown]
[im 1/29]
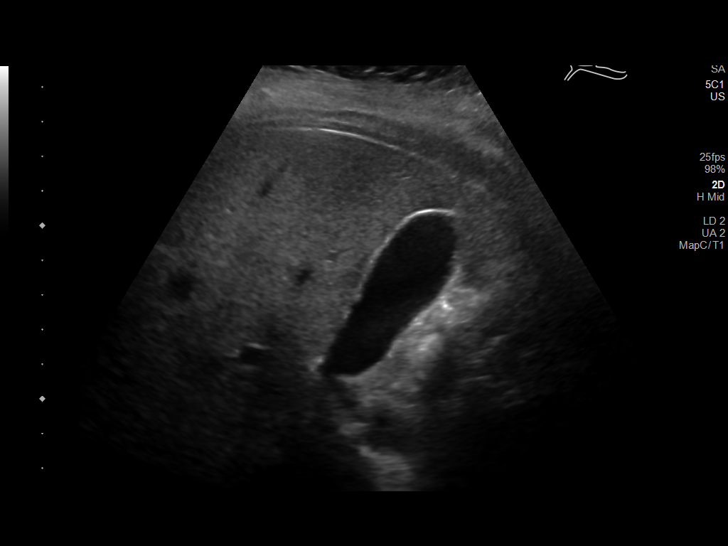
[im 3/29]
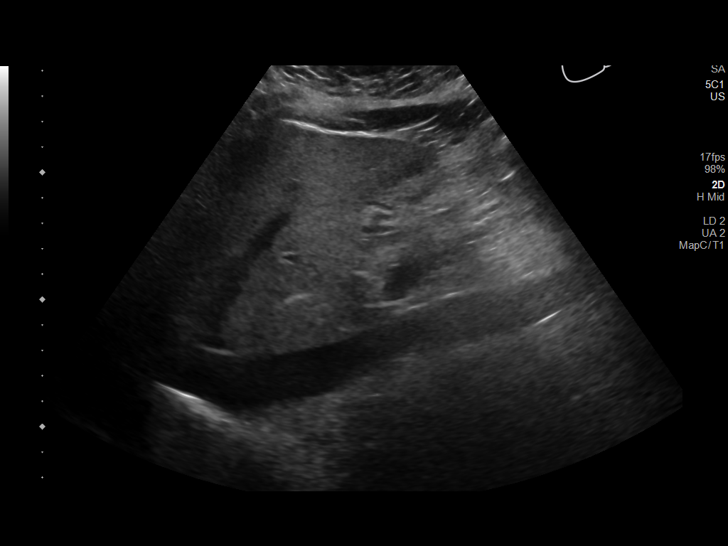
[im 5/29]
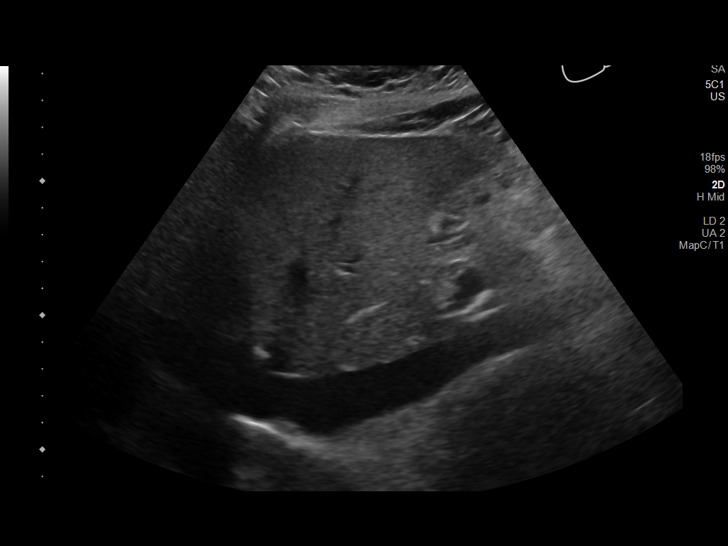
[im 8/29]
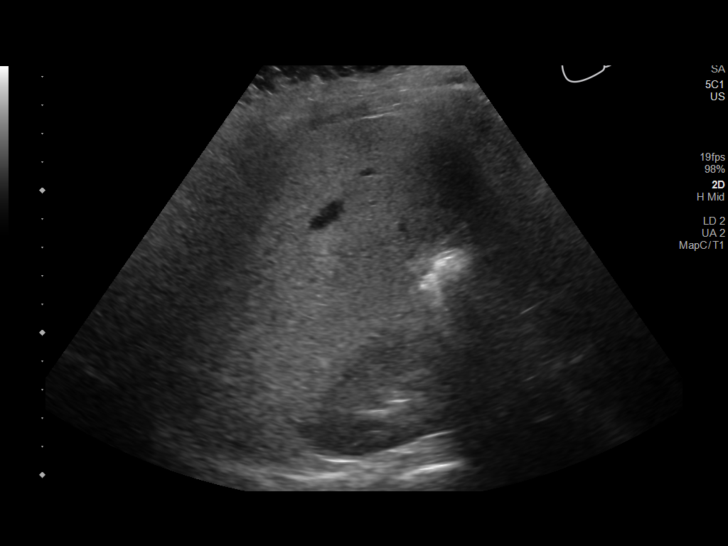
[im 10/29]
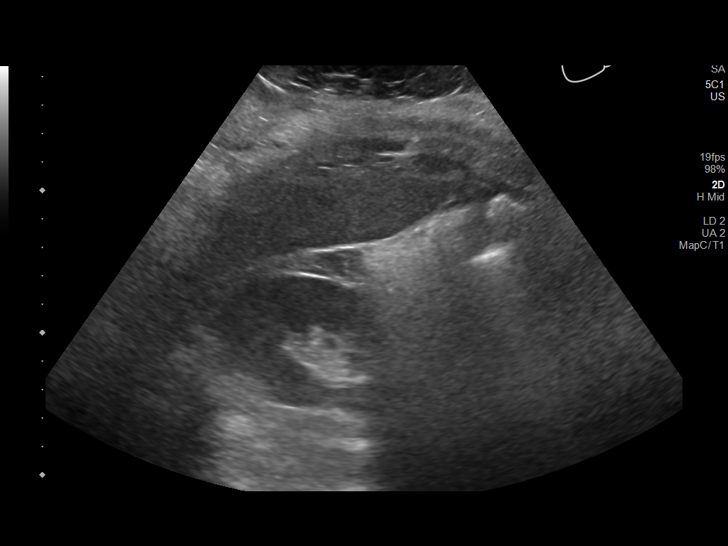
[im 11/29]
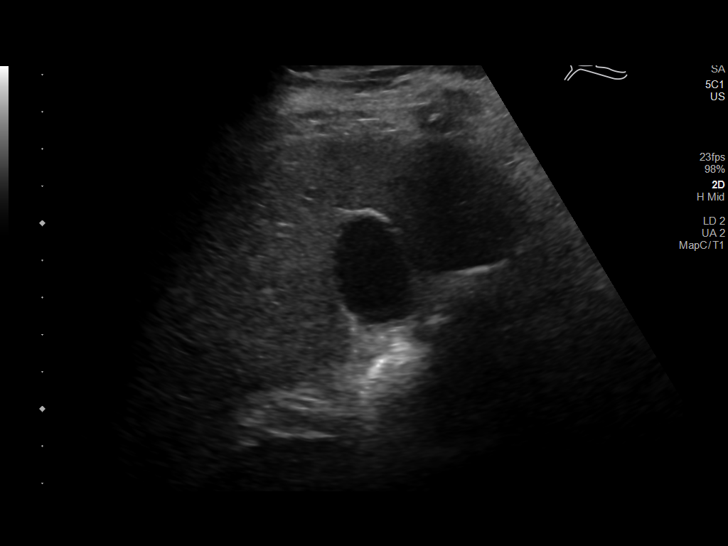
[im 13/29]
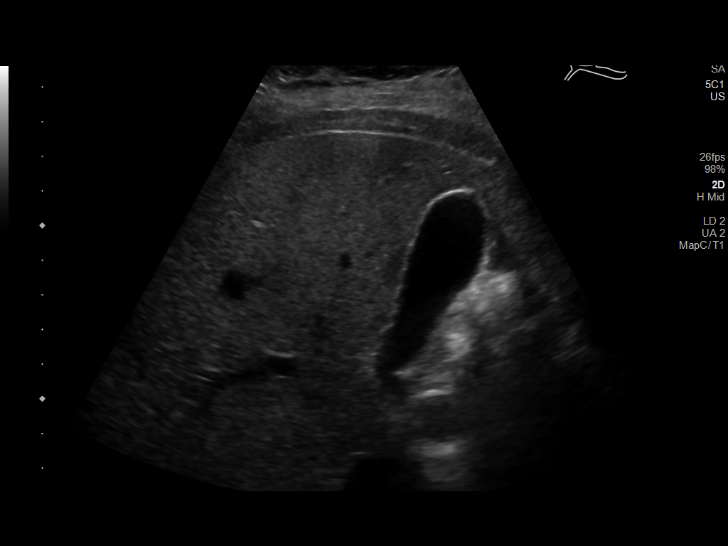
[im 16/29]
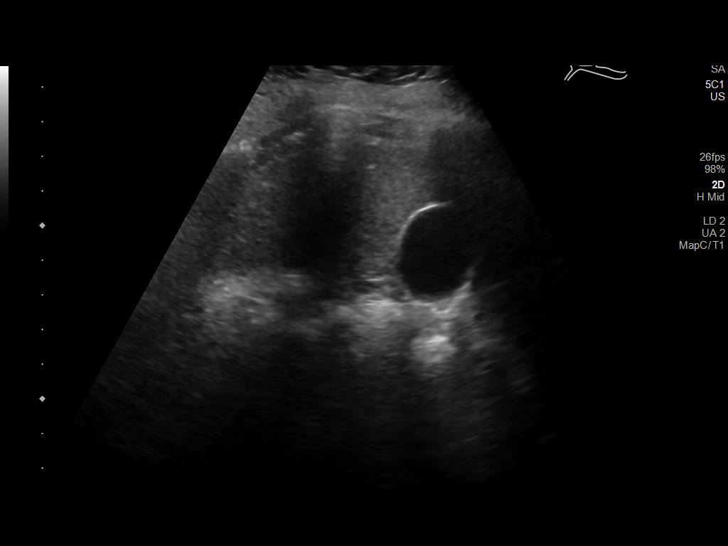
[im 18/29]
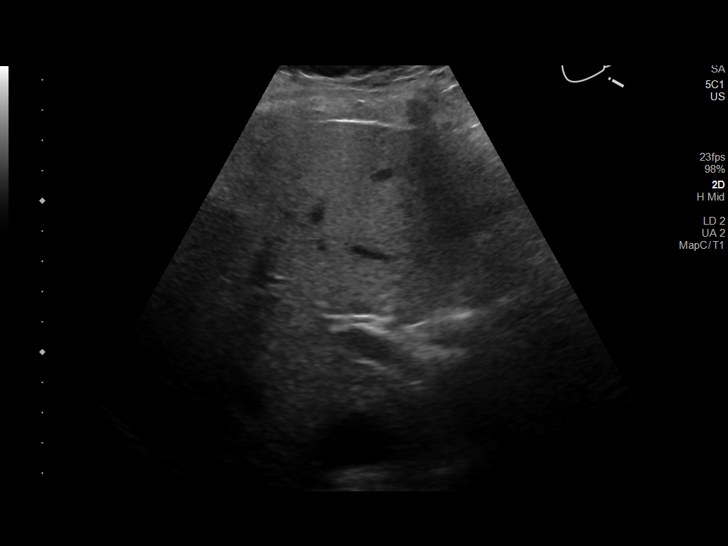
[im 19/29]
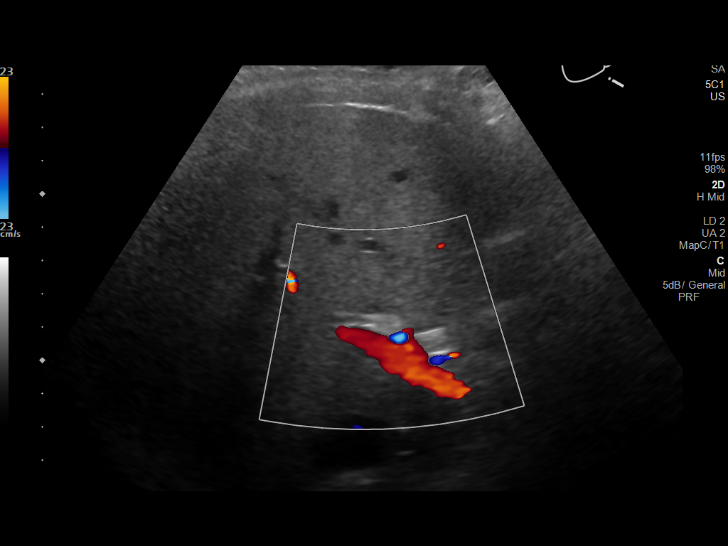
[im 22/29]
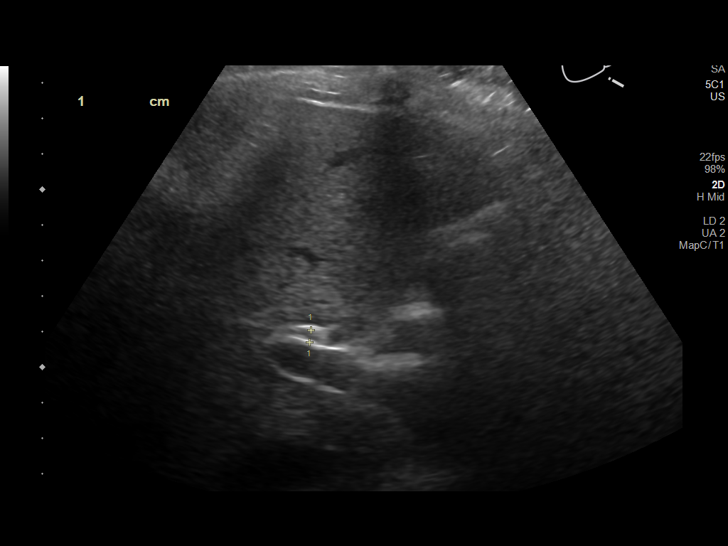
[im 24/29]
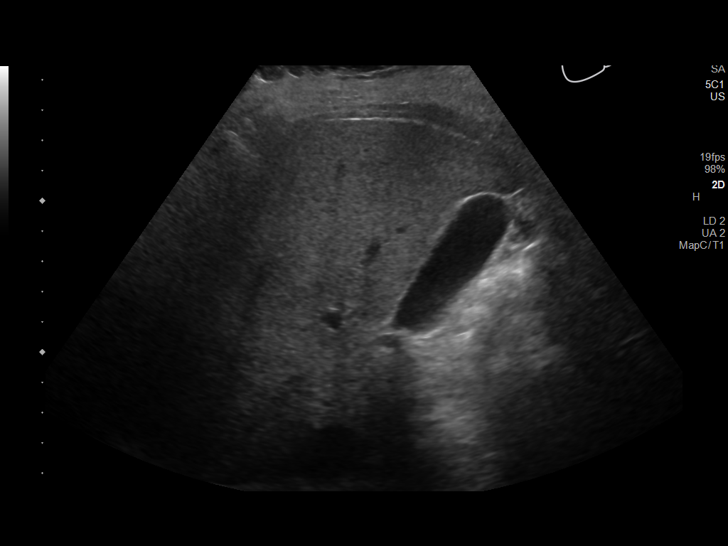
[im 26/29]
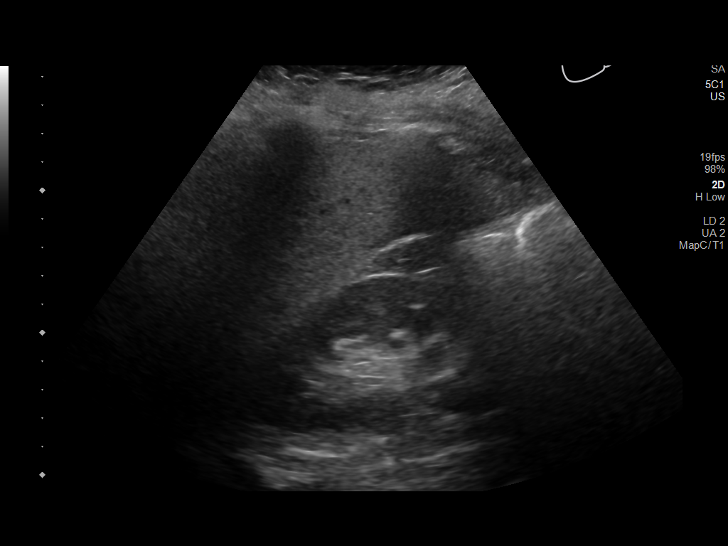
[im 29/29]
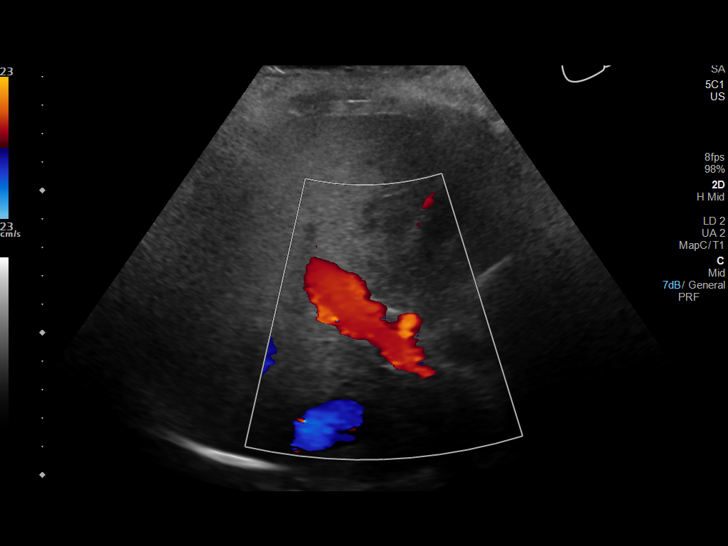

[14 of 25 positions shown; findings below may reference images not displayed]

FINDINGS: Gallbladder:

No gallstones or wall thickening visualized. No sonographic Murphy
sign noted by sonographer.

Common bile duct:

Diameter: 3.4 mm

Liver:

Increased echogenicity consistent fatty infiltration or
hepatocellular disease. Portal vein is patent on color Doppler
imaging with normal direction of blood flow towards the liver.

Other: Limited exam due to body habitus.
IMPRESSION: No acute abnormality identified.

## 2020-04-18 ENCOUNTER — Telehealth: Payer: Self-pay | Admitting: *Deleted

## 2020-04-18 NOTE — Chronic Care Management (AMB) (Signed)
  Care Management   Note  04/18/2020 Name: TALYA QUAIN MRN: [id number] DOB: [birth year]  [name] [name] is a 34 y.o. year old female who is a primary care patient of Leeroy Bock, DO and is actively engaged with the care management team. I reached out to Serena Colonel by phone today to assist with re-scheduling a follow up visit with the Licensed Clinical Social Worker  Follow up plan: Unsuccessful telephone outreach attempt made. A HIPAA compliant phone message was left for the patient providing contact information and requesting a return call.  The care management team will reach out to the patient again over the next 7 days.  If patient returns call to provider office, please advise to call Embedded Care Management Care Guide Gwenevere Ghazi at [phone].  Gwenevere Ghazi  Care Guide, Embedded Care Coordination [facility] Management  Direct Dial [phone]

## 2020-04-20 ENCOUNTER — Telehealth: Payer: Self-pay

## 2020-04-20 DIAGNOSIS — E119 Type 2 diabetes mellitus without complications: Secondary | ICD-10-CM

## 2020-04-20 MED ORDER — INSULIN ASPART 100 UNIT/ML FLEXPEN: PEN_INJECTOR | SUBCUTANEOUS | 0 refills | Status: DC

## 2020-04-20 MED ORDER — INSULIN GLARGINE 100 UNIT/ML SOLOSTAR PEN: 18.0000 [IU] | PEN_INJECTOR | Freq: Every day | SUBCUTANEOUS | 0 refills | Status: DC

## 2020-04-20 NOTE — Telephone Encounter (Signed)
Patient calls nurse line reporting she has been out of insulin for almost 1 week. Patient reports [facility] never received anything from [place]. Looks like the 11/23 prescription as set to "print." Will resend in as "normal" to patients pharmacy.

## 2020-04-23 NOTE — Chronic Care Management (AMB) (Signed)
  Care Management   Note  04/23/2020 Name: [name] [name] MRN: [id number] DOB: 1985/10/18  [name] [name] is a 34 y.o. year old female who is a primary care patient of Leeroy Bock, DO and is actively engaged with the care management team. I reached out to [name] [name] by phone today to assist with re-scheduling a follow up visit with the Licensed Clinical Social Worker  Follow up plan: Telephone appointment with care management team member scheduled for:04/27/2020  [name] [name]  Care Guide, Embedded Care Coordination [facility]  Care Management

## 2020-04-27 ENCOUNTER — Telehealth: Payer: Self-pay | Admitting: Licensed Clinical Social Worker

## 2020-04-27 ENCOUNTER — Telehealth: Payer: Medicaid Other

## 2020-04-27 NOTE — Chronic Care Management (AMB) (Signed)
    Clinical Social Work  Care Management  Unsuccessful Phone Appointment   04/27/2020 Name: [name] [name] MRN: [id number] DOB: 1985-09-03  [name] [name] is a 34 y.o. year old female who is a primary care patient of Jamelle Rushing L, DO . Patient is enrolled in a Managed Medicaid Health Plan: Yes  A second unsuccessful telephone outreach was attempted today. The patient was referred to the case management team for assistance with care management and care coordination.  F/U phone appointment to Serena Colonel to assess needs, and progress with care plan goals.  A HIPPA compliant phone message was left for the patient providing contact information and requesting a return call.   Plan: LCSW will wait for return call.   Review of patient status, including review of consultants reports, relevant laboratory and other test results, and collaboration with appropriate care team members and the patient's provider was performed as part of comprehensive patient evaluation and provision of care management services.    Sammuel Hines, LCSW Care Management & Coordination  [facility] / [facility]   [phone] 10:45 AM

## 2020-05-16 ENCOUNTER — Telehealth: Payer: Medicaid Other

## 2020-05-16 ENCOUNTER — Telehealth: Payer: Self-pay | Admitting: Licensed Clinical Social Worker

## 2020-05-16 NOTE — Chronic Care Management (AMB) (Signed)
    Clinical Social Work  Care Management  2nd Unsuccessful Phone Outreach    05/16/2020 Name: [name] [name] MRN: [id number] DOB: [birth year]  [name] [name] is a 35 y.o. year old female who is a primary care patient of Jamelle Rushing L, DO . Patient is enrolled in a Managed Medicaid Health Plan: Yes  F/U phone appointment scheduled with Serena Colonel  to assess needs, and progress with care plan goals.  Unable to leave a HIPPA compliant phone message due to voice mail not set up or no answer. Intervention: called twice today  Plan: will try calling again within the next 3 to 5 days  Review of patient status, including review of consultants reports, relevant laboratory and other test results, and collaboration with appropriate care team members and the patient's provider was performed as part of comprehensive patient evaluation and provision of care management services.    Sammuel Hines, LCSW Care Management & Coordination  [facility] / [facility]   [phone] 10:50 AM

## 2020-05-18 ENCOUNTER — Telehealth: Payer: Self-pay | Admitting: Licensed Clinical Social Worker

## 2020-05-18 ENCOUNTER — Telehealth: Payer: Self-pay | Admitting: *Deleted

## 2020-05-18 NOTE — Chronic Care Management (AMB) (Signed)
    Clinical Social Work  Care Management  Unsuccessful Phone Outreach    05/18/2020 Name: [name] [name] MRN: [id number] DOB: 29-Jan-1986  [name] is a 34 y.o. year old female who is a primary care patient of Jamelle Rushing L, DO . Patient is enrolled in a Managed Medicaid Health Plan: Yes  Third unsuccessful telephone outreach was attempted today. The patient was referred to the case management team for assistance with care management and care coordination. The patient's primary care provider has been notified of our unsuccessful attempts to make or maintain contact with the patient. The care management team is pleased to engage with this patient at any time in the future should he/she be interested in assistance from the care management team.   Unable to leave a HIPPA compliant phone message due to voice mail not set and statement " call cannot be completed at this time" .  Plan: LCSW will discontinue outreach will ask care team to reach out to patient to reschedule when they are able to reach her  Review of patient status, including review of consultants reports, relevant laboratory and other test results, and collaboration with appropriate care team members and the patient's provider was performed as part of comprehensive patient evaluation and provision of care management services.    Sammuel Hines, LCSW Care Management & Coordination  [facility] / [facility]   [phone] 11:50 AM

## 2020-05-18 NOTE — Chronic Care Management (AMB) (Signed)
  Care Management   Note  05/18/2020 Name: [name] [name] MRN: [id number] DOB: Feb 29, 1986  [name] [name] is a 35 y.o. year old female who is a primary care patient of Leeroy Bock, DO and is actively engaged with the care management team. I reached out to Serena Colonel by phone today to assist with re-scheduling a follow up visit with the Licensed Clinical Social Worker  Follow up plan: Unsuccessful telephone outreach attempt made. The care management team will reach out to the patient again over the next 7 days. If patient returns call to provider office, please advise to call Embedded Care Management Care Guide Gwenevere Ghazi at [phone].  Gwenevere Ghazi  Care Guide, Embedded Care Coordination [facility] Management

## 2020-05-24 NOTE — Chronic Care Management (AMB) (Signed)
  Care Management   Note  05/24/2020 Name: [name] MRN: [id number] DOB: 15-Jul-1985  [name] [name] is a 35 y.o. year old female who is a primary care patient of Leeroy Bock, DO and is actively engaged with the care management team. I reached out to Serena Colonel by phone today to assist with re-scheduling a follow up visit with the Licensed Clinical Child psychotherapist.  Follow up plan: Unsuccessful telephone outreach attempt made. The care management team will reach out to the patient again over the next 7 days. If patient returns call to provider office, please advise to call Embedded Care Management Care Guide Gwenevere Ghazi at [phone].  Gwenevere Ghazi  Care Guide, Embedded Care Coordination [facility] Management

## 2020-05-29 NOTE — Chronic Care Management (AMB) (Signed)
  Care Management   Note  05/29/2020 Name: [name] MRN: [id number] DOB: 1985/11/08  [name] [name] is a 35 y.o. year old female who is a primary care patient of Leeroy Bock, DO and is actively engaged with the care management team. I reached out to Serena Colonel by phone today to assist with re-scheduling a follow up visit with the Licensed Clinical Social Worker  Follow up plan: Unsuccessful telephone outreach attempt made. A HIPAA compliant phone message was left for the patient providing contact information and requesting a return call. Unable to make contact on outreach attempts x 3. PCP Leeroy Bock, DO notified via routed documentation in medical record. Appropriate care team members and provider have been notified via electronic communication. We have been unable to make contact with the patient for follow up. The care management team is available to follow up with the patient after provider conversation with the patient regarding recommendation for care management engagement and subsequent re-referral to the care management team. If patient returns call to provider office, please advise to call Embedded Care Management Care Guide Gwenevere Ghazi at [phone]  [facility] Guide, Embedded Care Coordination [facility]  Care Management

## 2020-08-08 ENCOUNTER — Telehealth: Payer: Self-pay

## 2020-08-08 NOTE — Telephone Encounter (Signed)
Patient calls nurse line regarding UTI symptoms. Patient states that she has been having burning with urination. Patient reports that she took OTC UTI test that was positive. Patient also recently took pre-employment drug screening with [employer]. Patient states this was positive for UTI as well.   Patient will bring in copy of results for provider to review. Informed patient that she may still need an appointment, however, I would discuss results with provider once we receive them.   Veronda Prude, RN

## 2020-08-16 ENCOUNTER — Encounter: Payer: Self-pay | Admitting: Student in an Organized Health Care Education/Training Program

## 2020-08-16 ENCOUNTER — Other Ambulatory Visit: Payer: Self-pay

## 2020-08-16 ENCOUNTER — Other Ambulatory Visit (HOSPITAL_COMMUNITY)
Admission: RE | Admit: 2020-08-16 | Discharge: 2020-08-16 | Disposition: A | Discharge status: Home / Self Care | Payer: Medicaid Other | Source: Ambulatory Visit | Attending: Family Medicine | Admitting: Family Medicine

## 2020-08-16 ENCOUNTER — Ambulatory Visit (INDEPENDENT_AMBULATORY_CARE_PROVIDER_SITE_OTHER): Payer: Medicaid Other | Admitting: Student in an Organized Health Care Education/Training Program

## 2020-08-16 ENCOUNTER — Telehealth: Payer: Self-pay | Admitting: Pharmacist

## 2020-08-16 VITALS — BP 105/80 | HR 93 | Ht 61.0 in | Wt 206.8 lb

## 2020-08-16 DIAGNOSIS — Z124 Encounter for screening for malignant neoplasm of cervix: Secondary | ICD-10-CM | POA: Diagnosis not present

## 2020-08-16 DIAGNOSIS — E119 Type 2 diabetes mellitus without complications: Secondary | ICD-10-CM

## 2020-08-16 DIAGNOSIS — E1165 Type 2 diabetes mellitus with hyperglycemia: Secondary | ICD-10-CM

## 2020-08-16 DIAGNOSIS — B3731 Acute candidiasis of vulva and vagina: Secondary | ICD-10-CM

## 2020-08-16 DIAGNOSIS — B373 Candidiasis of vulva and vagina: Secondary | ICD-10-CM | POA: Diagnosis not present

## 2020-08-16 LAB — POCT GLYCOSYLATED HEMOGLOBIN (HGB A1C): HbA1c, POC (controlled diabetic range): 12.1 % — AB (ref 0.0–7.0)

## 2020-08-16 MED ORDER — INSULIN GLARGINE 100 UNIT/ML SOLOSTAR PEN: 60.0000 [IU] | PEN_INJECTOR | Freq: Every day | SUBCUTANEOUS | 1 refills | Status: DC

## 2020-08-16 MED ORDER — TRULICITY 0.75 MG/0.5ML ~~LOC~~ SOAJ: 0.7500 mg | SUBCUTANEOUS | 1 refills | Status: DC

## 2020-08-16 MED ORDER — FLUCONAZOLE 150 MG PO TABS: 150.0000 mg | ORAL_TABLET | Freq: Once | ORAL | 0 refills | Status: AC

## 2020-08-16 NOTE — Assessment & Plan Note (Signed)
Diflucan x2 plus suggested continued topical agent

## 2020-08-16 NOTE — Patient Instructions (Signed)
It was a pleasure to see you today!  To summarize our discussion for this visit:  We completed a pap smear today. I will send in a yeast treatment and recommend using a topical as well.  I will reach out to our pharmacy team to see if there is assistance we can offer with affording your insulin. We need to get it under much better control to help prevent complications such as yeast infections, heart disease, kidney failure, etc.   With your long acting insulin, please start with 10units and then increase by 2 every day until your morning fasting blood sugar is 100.  Come back to see me in about 4 weeks.  Some additional health maintenance measures we should update are: Health Maintenance Due  Topic Date Due  . Hepatitis C Screening  Never done  . OPHTHALMOLOGY EXAM  08/03/2014  . FOOT EXAM  02/10/2017  . URINE MICROALBUMIN  11/05/2019  . PAP SMEAR-Modifier  12/28/2019  .    Call the clinic at [phone] if your symptoms worsen or you have any concerns.   Thank you for allowing me to take part in your care,  Dr. Jamelle Rushing   Hyperglycemia Hyperglycemia is when the sugar (glucose) level in your blood is too high. High blood sugar can happen to people who have or do not have diabetes. High blood sugar can happen quickly. It can be an emergency. What are the causes? If you have diabetes, high blood sugar may be caused by:  Medicines that increase blood sugar or affect your control of diabetes.  Getting less physical activity.  Overeating.  Being sick or injured or having an infection.  Having surgery.  Stress.  Not giving yourself enough insulin (if you are taking it). You may have high blood sugar because you have diabetes that has not been diagnosed yet. If you do not have diabetes, high blood sugar may be caused by:  Certain medicines.  Stress.  A bad illness.  An infection.  Having surgery.  Diseases of the pancreas. What increases the risk? This  condition is more likely to develop in people who have risk factors for diabetes, such as:  Having a family member with diabetes.  Certain conditions in which the body's defense system (immune system) attacks itself. These are called autoimmune disorders.  Being overweight.  Not being active.  Having a condition called insulin resistance.  Having a history of: ? Prediabetes. ? Diabetes when pregnant. ? Polycystic ovarian syndrome (PCOS). What are the signs or symptoms? This condition may not cause symptoms. If you do have symptoms, they may include:  Feeling more thirsty than normal.  Needing to pee (urinate) more often than normal.  Hunger.  Feeling very tired.  Blurry eyesight (vision). You may get other symptoms as the condition gets worse, such as:  Dry mouth.  Pain in your belly (abdomen).  Not being hungry (loss of appetite).  Breath that smells fruity.  Weakness.  Weight loss that is not planned.  A tingling or numb feeling in your hands or feet.  A headache.  Cuts or bruises that heal slowly. How is this treated? Treatment depends on the cause of your condition. Treatment may include:  Taking medicine to control your blood sugar levels.  Changing your medicine or dosage if you take insulin or other diabetes medicines.  Lifestyle changes. These may include: ? Exercising more. ? Eating healthier foods. ? Losing weight.  Treating an illness or infection.  Checking your blood  sugar more often.  Stopping or reducing steroid medicines. If your condition gets very bad, you will need to be treated in the hospital. Follow these instructions at home: General instructions  Take over-the-counter and prescription medicines only as told by your doctor.  Do not smoke or use any products that contain nicotine or tobacco. If you need help quitting, ask your doctor.  If you drink alcohol: ? Limit how much you have to:  0-1 drink a day for women who are  not pregnant.  0-2 drinks a day for men. ? Know how much alcohol is in a drink. In the [place] one drink equals one 12 oz bottle of beer (355 mL), one 5 oz glass of wine (148 mL), or one 1 oz glass of hard liquor (44 mL).  Manage stress. If you need help with this, ask your doctor.  Do exercises as told by your doctor.  Keep all follow-up visits. Eating and drinking  Stay at a healthy weight.  Make sure you drink enough fluid when you: ? Exercise. ? Get sick. ? Are in hot temperatures.  Drink enough fluid to keep your pee (urine) pale yellow.   If you have diabetes:  Know the symptoms of high blood sugar.  Follow your diabetes management plan as told by your doctor. Make sure you: ? Take insulin and medicines as told. ? Follow your exercise plan. ? Follow your meal plan. Eat on time. Do not skip meals. ? Check your blood sugar as often as told. Make sure you check before and after exercise. If you exercise longer or in a different way, check your blood sugar more often. ? Follow your sick day plan whenever you cannot eat or drink normally. Make this plan ahead of time with your doctor.  Share your diabetes management plan with people in your workplace, school, and household.  Check your pee for ketones when you are ill and as told by your doctor.  Carry a card or wear jewelry that says that you have diabetes.   Where to find more information American Diabetes Association: www.diabetes.org Contact a doctor if:  Your blood sugar level is at or above 240 mg/dL (87.8 mmol/L) for 2 days in a row.  You have problems keeping your blood sugar in your target range.  You have high blood pressure often.  You have signs of illness, such as: ? Feeling like you may vomit (feeling nauseous). ? Vomiting. ? A fever. Get help right away if:  Your blood sugar monitor reads "high" even when you are taking insulin.  You have trouble breathing.  You have a change in how you think,  feel, or act (mental status).  You feel like you may vomit, and the feeling does not go away.  You cannot stop vomiting. These symptoms may be an emergency. Get medical help right away. Call your local emergency services (911 in the [place]  Do not wait to see if the symptoms will go away.  Do not drive yourself to the hospital. Summary  Hyperglycemia is when the sugar (glucose) level in your blood is too high.  High blood sugar can happen to people who have or do not have diabetes.  Make sure you drink enough fluids and follow your meal plan. Exercise as often as told by your doctor.  Contact your doctor if you have problems keeping your blood sugar in your target range. This information is not intended to replace advice given to you by your  health care provider. Make sure you discuss any questions you have with your health care provider. Document Revised: 02/10/2020 Document Reviewed: 02/10/2020 Elsevier Patient Education  2021 [employer].

## 2020-08-16 NOTE — Assessment & Plan Note (Addendum)
Patient was previously on 10 units Tresiba and Ozempic and tolerating well.  Last follow-up was approximately 1 year ago.  A1c today is increased to 12.1 due to nonadherence to medications with affordability cited as the main cause. Urine microalbumin collected today Patient is symptomatic today with polyuria and polydipsia and severe vaginal yeast infection -Spoke with pharmacy team in regards to assistance to patient for medication coverage.  Forwarding chart to them.  Appreciate your assistance with medication assistance and patient education. -Discussed with patient that she can restart her insulin at 10 units daily and increase by 2 units/day until she reaches a morning fasting glucose of 100 or starts to have symptoms of hypoglycemia. -Patient endorses Metformin intolerance but should consider possibly restarting in the future -Refill Ozempic and Tresiba and consider adding on SLG 2 s/p yeast tx -BMP and lipid panel at follow-up appointment in 4 weeks - discuss contraception at f/u in consideration of adding on other disease modifying agents if patient able to afford additional medications

## 2020-08-16 NOTE — Telephone Encounter (Signed)
Contacted patient and explained coverage of insulin and GLPs would be different with Medicaid.    We discussed new plan:   Previous basal insulin will now be Lantus 10 units increasing by 2 units per day if fasting >100  Previous GLP Ozempic will now be  Trulicity 0.75mg  once weekly - informed patient she will need instruction on proper use when picking this medication up at her pharmacy.    Patient reports follow-up plan with PCP, Dr. Dareen Piano.

## 2020-08-16 NOTE — Telephone Encounter (Signed)
-----   Message from Leeroy Bock, DO sent at 08/16/2020  3:05 PM EDT ----- Please assist patient with samples and/or assistance with medication affordability and education. Thank you!

## 2020-08-16 NOTE — Assessment & Plan Note (Signed)
>>  ASSESSMENT AND PLAN FOR TYPE 2 DIABETES MELLITUS WITHOUT COMPLICATION, WITHOUT LONG-TERM CURRENT USE OF INSULIN  (HCC) WRITTEN ON 08/16/2020  3:00 PM BY ANDERSON, CHELSEY L, DO  Patient was previously on 10 units Tresiba  and Ozempic  and tolerating well.  Last follow-up was approximately 1 year ago.  A1c today is increased to 12.1 due to nonadherence to medications with affordability cited as the main cause. Urine microalbumin collected today Patient is symptomatic today with polyuria and polydipsia and severe vaginal yeast infection -Spoke with pharmacy team in regards to assistance to patient for medication coverage.  Forwarding chart to them.  Appreciate your assistance with medication assistance and patient education. -Discussed with patient that she can restart her insulin  at 10 units daily and increase by 2 units/day until she reaches a morning fasting glucose of 100 or starts to have symptoms of hypoglycemia. -Patient endorses Metformin  intolerance but should consider possibly restarting in the future -Refill Ozempic  and Tresiba  and consider adding on SLG 2 s/p yeast tx -BMP and lipid panel at follow-up appointment in 4 weeks - discuss contraception at f/u in consideration of adding on other disease modifying agents if patient able to afford additional medications

## 2020-08-16 NOTE — Progress Notes (Signed)
   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  Returns here for repeat check.  Diabetes-patient endorses being without insulin for 2 to 3 months due to losing her job and not being able to afford the medication.  Per patient, her out-of-pocket is $30 per insulin pen. She states that she was previously taking 10 units of Lantus daily.  She denies N/V, abdominal pain.  She does endorse Polyuria, polydipsia, yeast infection. Has tried topical yeast treatment without success. Fasting blood sugars have been 150-200s.   OBJECTIVE:   BP 105/80   Pulse 93   Ht 5\' 1"  (1.549 m)   Wt 206 lb 12.8 oz (93.8 kg)   LMP 08/06/2020 (Approximate)   SpO2 99%   BMI 39.07 kg/m   Physical Exam Vitals and nursing note reviewed. Exam conducted with a chaperone present.  Constitutional:      Appearance: She is obese.  Genitourinary:    Exam position: Lithotomy position.     Labia:        Right: Rash present.        Left: Rash present.      Cervix: Cervical bleeding present.     Uterus: Normal.      Comments: Extensive beefy red and friable cutaneous lesions throughout groin consistent with severe yeast infection Skin:    General: Skin is warm and dry.  Neurological:     General: No focal deficit present.     Mental Status: She is alert.  Psychiatric:        Mood and Affect: Mood normal.    ASSESSMENT/PLAN:   Type 2 diabetes mellitus without complication, without long-term current use of insulin (HCC) Patient was previously on 10 units Tresiba and Ozempic and tolerating well.  Last follow-up was approximately 1 year ago.  A1c today is increased to 12.1 due to nonadherence to medications with affordability cited as the main cause. Urine microalbumin collected today Patient is symptomatic today with polyuria and polydipsia and severe vaginal yeast infection -Spoke with pharmacy team in regards to  assistance to patient for medication coverage.  Forwarding chart to them.  Appreciate your assistance with medication assistance and patient education. -Discussed with patient that she can restart her insulin at 10 units daily and increase by 2 units/day until she reaches a morning fasting glucose of 100 or starts to have symptoms of hypoglycemia. -Patient endorses Metformin intolerance but should consider possibly restarting in the future -Refill Ozempic and Tresiba and consider adding on SLG 2 s/p yeast tx -BMP and lipid panel at follow-up appointment in 4 weeks - discuss contraception at f/u in consideration of adding on other disease modifying agents if patient able to afford additional medications  Vagina, candidiasis Diflucan x2 plus suggested continued topical agent     08/08/2020, DO [facility]

## 2020-08-17 LAB — MICROALBUMIN / CREATININE URINE RATIO
Creatinine, Urine: 47.4 mg/dL
Microalb/Creat Ratio: 19 mg/g creat (ref 0–29)
Microalbumin, Urine: 9.2 ug/mL

## 2020-08-17 NOTE — Progress Notes (Signed)
Thanks

## 2020-08-17 NOTE — Telephone Encounter (Signed)
Noted and agree. 

## 2020-08-17 NOTE — Telephone Encounter (Signed)
Thank you for following up. For my learning, do you have an estimate of her out of pocket costs for those meds?

## 2020-08-20 LAB — CYTOLOGY - PAP
Diagnosis: NEGATIVE
Diagnosis: REACTIVE

## 2020-09-07 ENCOUNTER — Emergency Department (HOSPITAL_COMMUNITY)
Admission: EM | Admit: 2020-09-07 | Discharge: 2020-09-07 | Discharge status: Against Medical Advice | Payer: Medicaid Other

## 2020-09-07 NOTE — ED Notes (Signed)
Pt checked out AMA. 

## 2020-09-16 ENCOUNTER — Emergency Department (HOSPITAL_COMMUNITY): Payer: Medicaid Other

## 2020-09-16 ENCOUNTER — Emergency Department (HOSPITAL_COMMUNITY)
Admission: EM | Admit: 2020-09-16 | Discharge: 2020-09-16 | Disposition: A | Discharge status: Home / Self Care | Payer: Medicaid Other | Attending: Emergency Medicine | Admitting: Emergency Medicine

## 2020-09-16 ENCOUNTER — Other Ambulatory Visit: Payer: Self-pay

## 2020-09-16 ENCOUNTER — Encounter (HOSPITAL_COMMUNITY): Payer: Self-pay

## 2020-09-16 DIAGNOSIS — Z20822 Contact with and (suspected) exposure to covid-19: Secondary | ICD-10-CM | POA: Diagnosis not present

## 2020-09-16 DIAGNOSIS — Z79899 Other long term (current) drug therapy: Secondary | ICD-10-CM | POA: Diagnosis not present

## 2020-09-16 DIAGNOSIS — J189 Pneumonia, unspecified organism: Secondary | ICD-10-CM

## 2020-09-16 DIAGNOSIS — Z794 Long term (current) use of insulin: Secondary | ICD-10-CM | POA: Insufficient documentation

## 2020-09-16 DIAGNOSIS — Z87891 Personal history of nicotine dependence: Secondary | ICD-10-CM | POA: Diagnosis not present

## 2020-09-16 DIAGNOSIS — Z8616 Personal history of COVID-19: Secondary | ICD-10-CM | POA: Diagnosis not present

## 2020-09-16 DIAGNOSIS — E119 Type 2 diabetes mellitus without complications: Secondary | ICD-10-CM | POA: Diagnosis not present

## 2020-09-16 DIAGNOSIS — J188 Other pneumonia, unspecified organism: Secondary | ICD-10-CM | POA: Diagnosis not present

## 2020-09-16 DIAGNOSIS — R0789 Other chest pain: Secondary | ICD-10-CM | POA: Diagnosis not present

## 2020-09-16 DIAGNOSIS — I10 Essential (primary) hypertension: Secondary | ICD-10-CM | POA: Insufficient documentation

## 2020-09-16 DIAGNOSIS — R059 Cough, unspecified: Secondary | ICD-10-CM | POA: Diagnosis not present

## 2020-09-16 LAB — CBC WITH DIFFERENTIAL/PLATELET
Abs Immature Granulocytes: 0.03 10*3/uL (ref 0.00–0.07)
Basophils Absolute: 0 10*3/uL (ref 0.0–0.1)
Basophils Relative: 0 %
Eosinophils Absolute: 0.2 10*3/uL (ref 0.0–0.5)
Eosinophils Relative: 3 %
HCT: 34.4 % — ABNORMAL LOW (ref 36.0–46.0)
Hemoglobin: 9.9 g/dL — ABNORMAL LOW (ref 12.0–15.0)
Immature Granulocytes: 1 %
Lymphocytes Relative: 18 %
Lymphs Abs: 1.1 10*3/uL (ref 0.7–4.0)
MCH: 22.3 pg — ABNORMAL LOW (ref 26.0–34.0)
MCHC: 28.8 g/dL — ABNORMAL LOW (ref 30.0–36.0)
MCV: 77.5 fL — ABNORMAL LOW (ref 80.0–100.0)
Monocytes Absolute: 0.3 10*3/uL (ref 0.1–1.0)
Monocytes Relative: 5 %
Neutro Abs: 4.6 10*3/uL (ref 1.7–7.7)
Neutrophils Relative %: 73 %
Platelets: 417 10*3/uL — ABNORMAL HIGH (ref 150–400)
RBC: 4.44 MIL/uL (ref 3.87–5.11)
RDW: 16.2 % — ABNORMAL HIGH (ref 11.5–15.5)
WBC: 6.3 10*3/uL (ref 4.0–10.5)
nRBC: 0 % (ref 0.0–0.2)

## 2020-09-16 LAB — COMPREHENSIVE METABOLIC PANEL
ALT: 26 U/L (ref 0–44)
AST: 19 U/L (ref 15–41)
Albumin: 3.1 g/dL — ABNORMAL LOW (ref 3.5–5.0)
Alkaline Phosphatase: 85 U/L (ref 38–126)
Anion gap: 8 (ref 5–15)
BUN: <5 mg/dL — ABNORMAL LOW (ref 6–20)
CO2: 22 mmol/L (ref 22–32)
Calcium: 8.6 mg/dL — ABNORMAL LOW (ref 8.9–10.3)
Chloride: 106 mmol/L (ref 98–111)
Creatinine, Ser: 0.64 mg/dL (ref 0.44–1.00)
GFR, Estimated: >60 mL/min (ref >60)
Glucose, Bld: 135 mg/dL — ABNORMAL HIGH (ref 70–99)
Potassium: 3.6 mmol/L (ref 3.5–5.1)
Sodium: 136 mmol/L (ref 135–145)
Total Bilirubin: 0.4 mg/dL (ref 0.3–1.2)
Total Protein: 6.7 g/dL (ref 6.5–8.1)

## 2020-09-16 LAB — RESP PANEL BY RT-PCR (FLU A&B, COVID) ARPGX2
Influenza A by PCR: NEGATIVE
Influenza B by PCR: NEGATIVE
SARS Coronavirus 2 by RT PCR: NEGATIVE

## 2020-09-16 MED ORDER — AMOXICILLIN-POT CLAVULANATE ER 1000-62.5 MG PO TB12: 2.0000 | ORAL_TABLET | Freq: Two times a day (BID) | ORAL | 0 refills | Status: AC

## 2020-09-16 MED ORDER — AZITHROMYCIN 250 MG PO TABS: ORAL_TABLET | ORAL | 0 refills | Status: DC

## 2020-09-16 MED ORDER — ALBUTEROL SULFATE HFA 108 (90 BASE) MCG/ACT IN AERS
3.0000 | INHALATION_SPRAY | Freq: Once | RESPIRATORY_TRACT | Status: AC
  Administered 2020-09-16: 3 via RESPIRATORY_TRACT
  Filled 2020-09-16: qty 6.7

## 2020-09-16 MED ORDER — ACETAMINOPHEN 500 MG PO TABS
1000.0000 mg | ORAL_TABLET | Freq: Once | ORAL | Status: AC
  Administered 2020-09-16: 1000 mg via ORAL
  Filled 2020-09-16: qty 2

## 2020-09-16 NOTE — Discharge Instructions (Addendum)
  You were evaluated in the Emergency Department and after careful evaluation, we did not find any emergent condition requiring admission or further testing in the hospital.   Your exam/testing today was overall reassuring.  Symptoms seem to be due to multifocal pneumonia, I want you to take the 2 antibiotics as directed.  Make sure to also take Tylenol as directed on the bottle for pain.  If your symptoms become worse or if you have shortness of breath need to come back to the emergency department.  Please follow-up with your primary care in the next couple of days.  Your COVID test and flu test were negative. Please return to the Emergency Department if you experience any worsening of your condition.  Thank you for allowing [place] to be a part of your care. Please speak to your pharmacist about any new medications prescribed today in regards to side effects or interactions with other medications.     Get help right away if: You are short of breath and this gets worse. You have more chest pain. Your sickness gets worse. This is very serious if: You are an older adult. Your body's defense system is weak. You cough up blood.

## 2020-09-16 NOTE — ED Triage Notes (Addendum)
Patient arrives with c/o of worsening cough x4 days. Patient reports rib discomfort due to strong cough. Patient reports taking cough medicine at home with minimal relief.

## 2020-09-16 NOTE — ED Provider Notes (Signed)
[facility] EMERGENCY DEPARTMENT Provider Note   CSN: [id number] Arrival date & time: 09/16/20  6761     History Chief Complaint  Patient presents with  . Cough    [name] [name] is a 35 y.o. female with past medical history of hypertension, type 2 diabetes, obesity that presents to the emergency department today for coughing.  Patient states that coughing started 4 days ago, has been staying consistent.  States that she is unable to shake the cough, has been trying some home cough medications without relief.  Patient denies any other symptoms, no fevers, chills, nausea, vomiting, diarrhea, congestion, sore throat, myalgias, headache.  States that cough is nonproductive, no hemoptysis.  Has been vaccinated against COVID.Patient denies any ACE or ARB.  States that she has coughing attacks when these occur, denies any asthma history.  Patient also admits to some bilateral rib pain every time she coughs.  Denies any chest pain or shortness of breath.  No other complaints at this time.  No abdominal pain or back pain.  States that she is generally healthy, does have diabetes, has been compliant with her insulin.     HPI     Past Medical History:  Diagnosis Date  . Abnormal Pap smear   . Diabetes mellitus    type II  . GBS (group B streptococcus) UTI complicating pregnancy   . Herpes simplex without mention of complication   . Hypertension   . Irregular menses   . Obesity     Patient Active Problem List   Diagnosis Date Noted  . Depression, recurrent ([place]) 03/21/2020  . Hx of menorrhagia 03/15/2020  . Type 2 diabetes mellitus with diabetic neuropathy, unspecified ([place]) 03/14/2020  . Pneumonia due to COVID-19 virus 12/28/2019  . Acute hypoxemic respiratory failure due to COVID-19 ([place]) 12/28/2019  . UTI (urinary tract infection) 12/28/2019  . Sepsis ([place]) 12/28/2019  . DKA (diabetic ketoacidoses) 12/28/2019  . Anemia 04/15/2019  . Abdominal pain 03/14/2019   . Screening for malignant neoplasm of cervix 12/30/2018  . Type 2 diabetes mellitus without complication, without long-term current use of insulin ([place]) 12/30/2018  . Chest pain 05/20/2017  . History of tubal ligation   . Medication refill 07/22/2016  . Vaginal discharge 05/16/2016  . Constipation 03/04/2016  . Yeast infection 02/11/2016  . Vagina, candidiasis 03/24/2014  . Alopecia areata 09/29/2013  . Type II diabetes mellitus, uncontrolled ([place]) 09/20/2007  . 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 Obesity 07/09/2006  . RHINITIS, ALLERGIC 07/09/2006  . PAPANICOLAOU SMEAR, ABNORMAL 07/09/2006    Past Surgical History:  Procedure Laterality Date  . CESAREAN SECTION  01/30/2011   Procedure: CESAREAN SECTION;  Surgeon: [name], MD;  Location: [facility];  Service: Gynecology;  Laterality: N/A;  . CESAREAN SECTION N/A 10/22/2014   Procedure: CESAREAN SECTION;  Surgeon: [name], MD;  Location: [facility];  Service: Obstetrics;  Laterality: N/A;  . TUBAL LIGATION    . WISDOM TOOTH EXTRACTION       OB History    Gravida  4   Para  2   Term  1   Preterm  1   AB  2   Living  2     SAB  2   IAB      Ectopic      Multiple  0   Live Births  2           Family History  Problem Relation Age  of Onset  . Diabetes Paternal Grandmother   . Hypertension Paternal Grandmother   . Diabetes Maternal Grandmother   . Diabetes Father   . Cancer Paternal Uncle   . Breast cancer Maternal Aunt   . Breast cancer Paternal Aunt     Social History   Tobacco Use  . Smoking status: Former Research scientist (life sciences)  . Smokeless tobacco: Never Used  Vaping Use  . Vaping Use: Never used  Substance Use Topics  . Alcohol use: Yes    Comment: "occassionally"  . Drug use: No    Home Medications Prior to Admission medications   Medication Sig Start Date End Date Taking? Authorizing Provider  amoxicillin-clavulanate (AUGMENTIN XR) 1000-62.5 MG 12 hr tablet Take 2 tablets by  mouth 2 (two) times daily for 7 days. 09/16/20 09/23/20 Yes Alfredia Client, PA-C  azithromycin (ZITHROMAX Z-PAK) 250 MG tablet Take as directed 09/16/20  Yes Alfredia Client, PA-C  albuterol (VENTOLIN HFA) 108 (90 Base) MCG/ACT inhaler Inhale 2 puffs into the lungs every 4 (four) hours as needed for wheezing or shortness of breath. 01/01/20   Ghimire, Henreitta Leber, MD  Dulaglutide (TRULICITY) 6.76 PP/5.0DT SOPN Inject 0.75 mg into the skin once a week. 08/16/20   Leavy Cella, RPH-CPP  gabapentin (NEURONTIN) 300 MG capsule Take 1 capsule (300 mg total) by mouth at bedtime. 03/14/20   Anderson, Chelsey L, DO  glucose blood (FREESTYLE TEST STRIPS) test strip 1 each by Other route See admin instructions. Check glucose 3 time per day and write result in logbook 01/01/20   Jonetta Osgood, MD  glucose monitoring kit (FREESTYLE) monitoring kit 1 each by Does not apply route as needed for other. 12/28/18   Sherene Sires, DO  insulin aspart (NOVOLOG) 100 UNIT/ML FlexPen 0-20 Units, Subcutaneous, 3 times daily with meals CBG < 70: Implement Hypoglycemia measures CBG 70 - 120: 0 units CBG 121 - 150: 3 units CBG 151 - 200: 4 units CBG 201 - 250: 7 units CBG 251 - 300: 11 units CBG 301 - 350: 15 units CBG 351 - 400: 20 units CBG > 400: call MD Patient not taking: Reported on 08/16/2020 04/20/20   Doristine Mango L, DO  insulin glargine (LANTUS) 100 UNIT/ML Solostar Pen Inject 60 Units into the skin daily. Restart 10 units once daily and increase dose 2 units per day. Stop titration when fasting BG  137m/dl 08/16/20   KLeavy Cella RPH-CPP  Insulin Pen Needle 32G X 8 MM MISC Use as directed 01/01/20   GJonetta Osgood MD  Lancets (FREESTYLE) lancets 1 each by Other route See admin instructions for 30 doses. Check glucose 3 time per day and write result in logbook 01/01/20   GJonetta Osgood MD  ondansetron (ZOFRAN ODT) 4 MG disintegrating tablet Take 1 tablet (4 mg total) by mouth every 8 (eight) hours as needed for  nausea or vomiting. 12/25/19   Joy, Shawn C, PA-C  terconazole (TERAZOL 7) 0.4 % vaginal cream Place 1 applicator vaginally at bedtime. 03/21/20   HZenia Resides MD    Allergies    Metformin and related  Review of Systems   Review of Systems  Constitutional: Negative for chills, diaphoresis, fatigue and fever.  HENT: Negative for congestion, sore throat and trouble swallowing.   Eyes: Negative for pain and visual disturbance.  Respiratory: Positive for cough. Negative for shortness of breath and wheezing.   Cardiovascular: Negative for chest pain, palpitations and leg swelling.  Gastrointestinal: Negative for abdominal distention,  abdominal pain, diarrhea, nausea and vomiting.  Genitourinary: Negative for difficulty urinating.  Musculoskeletal: Negative for back pain, neck pain and neck stiffness.  Skin: Negative for pallor.  Neurological: Negative for dizziness, speech difficulty, weakness and headaches.  Psychiatric/Behavioral: Negative for confusion.    Physical Exam Updated Vital Signs BP 136/81 (BP Location: Right Arm)   Pulse 87   Temp 99.1 F (37.3 C) (Oral)   Resp 16   LMP 09/14/2020 (Within Days)   SpO2 99%   Physical Exam Constitutional:      General: She is not in acute distress.    Appearance: Normal appearance. She is not ill-appearing, toxic-appearing or diaphoretic.     Comments: Patient without acute respiratory stress.  Patient is sitting comfortably in bed, no tripoding, use of accessory muscles.  Patient is speaking to me in full sentences.  Patient will have coughing attacks where she constantly coughs, last for about a minute and then will resolve.  No shortness of breath at that time.  HENT:     Head: Normocephalic and atraumatic.     Jaw: There is normal jaw occlusion. No trismus, swelling or malocclusion.     Nose: No congestion or rhinorrhea.     Right Sinus: No maxillary sinus tenderness or frontal sinus tenderness.     Left Sinus: No maxillary  sinus tenderness or frontal sinus tenderness.     Mouth/Throat:     Mouth: Mucous membranes are moist. No oral lesions.     Dentition: Normal dentition.     Tongue: No lesions.     Palate: No mass and lesions.     Pharynx: Oropharynx is clear. Uvula midline. No pharyngeal swelling, oropharyngeal exudate, posterior oropharyngeal erythema or uvula swelling.     Tonsils: No tonsillar exudate or tonsillar abscesses. 1+ on the right. 1+ on the left.     Comments: Patient without tonsillar enlargement or exudate.  No signs of peritonsillar abscess, palate without any tenderness or masses palpated.  No swelling under the tongue, uvula is midline without any inflammation. Eyes:     General: No visual field deficit.       Right eye: No discharge.        Left eye: No discharge.     Extraocular Movements: Extraocular movements intact.     Conjunctiva/sclera: Conjunctivae normal.     Pupils: Pupils are equal, round, and reactive to light.  Cardiovascular:     Rate and Rhythm: Normal rate and regular rhythm.     Pulses: Normal pulses.     Heart sounds: Normal heart sounds. No murmur heard. No friction rub. No gallop.   Pulmonary:     Effort: Pulmonary effort is normal. No respiratory distress.     Breath sounds: Normal breath sounds. No stridor. No wheezing, rhonchi or rales.  Chest:     Chest wall: No tenderness.  Abdominal:     General: Abdomen is flat. Bowel sounds are normal. There is no distension.     Palpations: Abdomen is soft.     Tenderness: There is no abdominal tenderness. There is no right CVA tenderness or left CVA tenderness.  Musculoskeletal:        General: No swelling or tenderness. Normal range of motion.     Cervical back: Normal range of motion. No rigidity or tenderness.     Right lower leg: No edema.     Left lower leg: No edema.  Lymphadenopathy:     Cervical: No cervical adenopathy.  Skin:  General: Skin is warm and dry.     Capillary Refill: Capillary refill  takes less than 2 seconds.     Findings: No erythema or rash.  Neurological:     General: No focal deficit present.     Mental Status: She is alert and oriented to person, place, and time.     Cranial Nerves: Cranial nerves are intact. No cranial nerve deficit or facial asymmetry.     Motor: Motor function is intact. No weakness.     Coordination: Coordination is intact.     Gait: Gait is intact. Gait normal.  Psychiatric:        Mood and Affect: Mood normal.     ED Results / Procedures / Treatments   Labs (all labs ordered are listed, but only abnormal results are displayed) Labs Reviewed  CBC WITH DIFFERENTIAL/PLATELET - Abnormal; Notable for the following components:      Result Value   Hemoglobin 9.9 (*)    HCT 34.4 (*)    MCV 77.5 (*)    MCH 22.3 (*)    MCHC 28.8 (*)    RDW 16.2 (*)    Platelets 417 (*)    All other components within normal limits  COMPREHENSIVE METABOLIC PANEL - Abnormal; Notable for the following components:   Glucose, Bld 135 (*)    BUN <5 (*)    Calcium 8.6 (*)    Albumin 3.1 (*)    All other components within normal limits  RESP PANEL BY RT-PCR (FLU A&B, COVID) ARPGX2    EKG None  Radiology DG Chest Port 1 View  Result Date: 09/16/2020 CLINICAL DATA:  Cough.  Chest wall pain. EXAM: PORTABLE CHEST 1 VIEW COMPARISON:  12/28/2019 FINDINGS: Cardiac silhouette is normal in size. Normal mediastinal and hilar contours. Patchy airspace opacities are noted in the right mid to upper lung with subtle opacity suggested in the left mid to lower lung. Opacities were present in a similar distribution on the prior chest radiograph. No pleural effusion or pneumothorax. Skeletal structures are grossly intact. IMPRESSION: 1. Patchy airspace lung opacities in the right mid to upper lung, minimally on the left, consistent with multifocal pneumonia. Electronically Signed   By: Lajean Manes M.D.   On: 09/16/2020 10:27    Procedures Procedures   Medications Ordered  in ED Medications  acetaminophen (TYLENOL) tablet 1,000 mg (1,000 mg Oral Given 09/16/20 0938)  albuterol (VENTOLIN HFA) 108 (90 Base) MCG/ACT inhaler 3 puff (3 puffs Inhalation Given 09/16/20 9373)    ED Course  I have reviewed the triage vital signs and the nursing notes.  Pertinent labs & imaging results that were available during my care of the patient were reviewed by me and considered in my medical decision making (see chart for details).    MDM Rules/Calculators/A&P                          [name] [name] is a 35 y.o. female with past medical history of hypertension, type 2 diabetes, obesity that presents to the emergency department today for coughing.  Patient is nontoxic, no respiratory distress.  Patient denies any shortness of breath or chest pain.  Vitals unremarkable, temperature 100.1.  Will give Tylenol and albuterol for bronchospasms.  No wheezing heard on exam.  Differential to include COVID, viral illness, bronchitis.  EKG sinus rhythm, interpreted by me.  Work-up today with no leukocytosis, hemoglobin 9.9, this appears to be patient's baseline.  CMP  unremarkable.  COVID and flu negative.  Patient does have chest x-ray with findings showing multifocal pneumonia, discussed this with patient.  Will prescribe double CAP Coverage since patient does have diabetes and have patient follow-up with PCP in the next couple of days.  Patient otherwise generally healthy.  Strict return precautions given including shortness of breath.  Doubt need for further emergent work up at this time. I explained the diagnosis and have given explicit precautions to return to the ER including for any other new or worsening symptoms. The patient understands and accepts the medical plan as it's been dictated and I have answered their questions. Discharge instructions concerning home care and prescriptions have been given. The patient is STABLE and is discharged to home in good condition.    Final Clinical  Impression(s) / ED Diagnoses Final diagnoses:  Multifocal pneumonia    Rx / DC Orders ED Discharge Orders         Ordered    amoxicillin-clavulanate (AUGMENTIN XR) 1000-62.5 MG 12 hr tablet  2 times daily        09/16/20 1139    azithromycin (ZITHROMAX Z-PAK) 250 MG tablet        09/16/20 1139           Alfredia Client, PA-C 09/16/20 1147    Charlesetta Shanks, MD 09/18/20 [phone]

## 2020-09-17 ENCOUNTER — Telehealth: Payer: Self-pay

## 2020-09-17 NOTE — Telephone Encounter (Signed)
Transition Care Management Unsuccessful Follow-up Telephone Call  Date of discharge and from where:  09/16/2020 from [facility]  Attempts:  1st Attempt  Reason for unsuccessful TCM follow-up call:  Left voice message

## 2020-09-18 NOTE — Telephone Encounter (Signed)
Transition Care Management Follow-up Telephone Call  Date of discharge and from where: 09/16/2020 from [facility]  How have you been since you were released from the hospital? Pt stated that she is feeling much better today and has no questions or concerns.   Any questions or concerns? No  Items Reviewed:  Did the pt receive and understand the discharge instructions provided? Yes   Medications obtained and verified? Yes   Other? No   Any new allergies since your discharge? No   Dietary orders reviewed? n/a  Do you have support at home? Yes   Functional Questionnaire: (I = Independent and D = Dependent) ADLs: I  Bathing/Dressing- I  Meal Prep- I  Eating- I  Maintaining continence- I  Transferring/Ambulation- I  Managing Meds- I   Follow up appointments reviewed:   PCP Hospital f/u appt confirmed? No  .  Specialist Hospital f/u appt confirmed? No  .  Are transportation arrangements needed? No   If their condition worsens, is the pt aware to call PCP or go to the Emergency Dept.? Yes  Was the patient provided with contact information for the PCP's office or ED? Yes  Was to pt encouraged to call back with questions or concerns? Yes

## 2020-10-10 ENCOUNTER — Ambulatory Visit (INDEPENDENT_AMBULATORY_CARE_PROVIDER_SITE_OTHER): Payer: Medicaid Other | Admitting: Family Medicine

## 2020-10-10 ENCOUNTER — Encounter: Payer: Self-pay | Admitting: Family Medicine

## 2020-10-10 ENCOUNTER — Other Ambulatory Visit: Payer: Self-pay

## 2020-10-10 DIAGNOSIS — J320 Chronic maxillary sinusitis: Secondary | ICD-10-CM

## 2020-10-10 DIAGNOSIS — E1140 Type 2 diabetes mellitus with diabetic neuropathy, unspecified: Secondary | ICD-10-CM

## 2020-10-10 DIAGNOSIS — Z794 Long term (current) use of insulin: Secondary | ICD-10-CM | POA: Diagnosis not present

## 2020-10-10 LAB — GLUCOSE, POCT (MANUAL RESULT ENTRY): POC Glucose: 298 mg/dl — AB (ref 70–99)

## 2020-10-10 MED ORDER — AFRIN NASAL SPRAY 0.05 % NA SOLN: 1.0000 | Freq: Two times a day (BID) | NASAL | 0 refills | Status: AC

## 2020-10-10 NOTE — Patient Instructions (Signed)
It was a pleasure to see you today!  Thank you for choosing [facility] for your primary care.   [name] [name] was seen for blurry vision and headache.   Our plans for today were:  I believe you are having a headache related to some inflammation in your sinuses.   Please use the nasal spray that I have prescribed to help with the nasal  Congestion which may help relieve some of the sinus pressure.   You can also try over the counter sinus medication such as alka seltzer to see if that helps to relieve your symptoms. Please make sure to stay hydrated and monitoring your blood sugars. Your blood pressure was within normal range today.   If you experience worsening headache, dizziness or lightheadedness, develop vomiting, fever or difficulty moving your neck. Please return to care for evaluation.    Please plan to follow up with your PCP in one week to check on your vision and headache symptoms.      Sinus Headache  A sinus headache occurs when your sinuses become clogged or swollen. Sinuses are air-filled spaces in your skull that are behind the bones of your face and forehead. Sinus headaches can range from mild to severe. What are the causes? A sinus headache can result from various conditions that affect the sinuses. Common causes include: Colds. Sinus infections. Allergies. Many people confuse sinus headaches with migraines or tension headaches because both headaches can cause facial pain and nasal symptoms. What are the signs or symptoms? The main symptom of this condition is a headache that may feel like pain or pressure in your face, forehead, ears, or upper teeth. People who have a sinus headache often have other symptoms, such as: Congested or runny nose. Fever. Inability to smell. Weather changes can make symptoms worse. How is this diagnosed? This condition may be diagnosed based on: A physical exam and medical history. Imaging tests, such as a CT scan or  MRI, to check for problems with the sinuses. Examination of the sinuses using a thin tool with a camera that is inserted through your nose (endoscopy). How is this treated? Treatment for this condition depends on the cause. Sinus pain that is caused by a sinus infection may be treated with antibiotic medicine. Sinus pain that is caused by congestion may be helped by rinsing out (flushing) the nose and sinuses with saline solution. Sinus pain that is caused by allergies may be helped by allergy medicines (antihistamines) and medicated nasal sprays. Sinus surgery may be needed in some cases if other treatments do not help. Follow these instructions at home: General instructions If directed: Apply a warm, moist washcloth to your face to help relieve pain. Use a nasal saline wash. Hydrate and humidify Drink enough water to keep your urine clear or pale yellow. Staying hydrated will help to thin your mucus. Use a cool mist humidifier to keep the humidity level in your home above 50%. Inhale steam for 10-15 minutes, 3-4 times a day or as told by your health care provider. You can do this in the bathroom while a hot shower is running. Limit your exposure to cool or dry air. Medicines Take over-the-counter and prescription medicines only as told by your health care provider. If you were prescribed an antibiotic medicine, take it as told by your health care provider. Do not stop taking the antibiotic even if you start to feel better. If you have congestion, use a nasal spray to help lessen  pressure.   Contact a health care provider if: You have a headache more than one time a week. You have sensitivity to light or sound. You develop a fever. You feel nauseous or you vomit. Your headaches do not get better with treatment. Many people think that they have a sinus headache when they actually have a migraine or a tension headache. Get help right away if: You have vision problems. You have sudden,  severe pain in your face or head. You have a seizure. You are confused. You have a stiff neck. Summary A sinus headache occurs when your sinuses become clogged or swollen. A sinus headache can result from various conditions that affect the sinuses, such as a cold, a sinus infection, or an allergy. Treatment for this condition depends on the cause. It may include medicine, such as antibiotics or antihistamines. This information is not intended to replace advice given to you by your health care provider. Make sure you discuss any questions you have with your health care provider. Document Revised: 02/07/2020 Document Reviewed: 02/07/2020 Elsevier Patient Education  2021 [employer].       Best Wishes,   Dr. Neita Garnet

## 2020-10-10 NOTE — Progress Notes (Signed)
    SUBJECTIVE:   CHIEF COMPLAINT / HPI: blurry vision  Patient reports that her BP has been elevated. She reports that she has been taking her insulin as prescribed and reports BG of 102 and ranging from 80s. She reports a current HA and is having a lot of pain due to current menses. She reports taht the blurry vision has been present for two days. She reports that her vision looks blurry including green lights when driving. Reprts that it has been several years since seeing an eye doctor. HA coincides with blurry vision. Tried ibuprofen that did not relieve her pain. She reports taking 800mg .   She reports that sleeping helps the HA but it does not completely resolve. Denies weakness and HA returns soon after waking. Reports a stuffy nose, no sore throat. No neck pain.  No SOB or chest pain.  Reports that it feels different than a migraine but worries this could turn into one. Feels better when lying in the dark. Denies nausea.  Localized to front and in frontal sinus area.   PERTINENT  PMH / PSH:  T2DM    OBJECTIVE:   LMP 09/14/2020 (Within Days)   General: female appearing stated age in no acute distress HEENT: MMM, no oral lesions noted,Neck non-tender without lymphadenopathy, masses or thyromegaly, enlarged turbinates, frontal and maxillary tenderness to palpation, no rhinorrhea noted Cardio: Normal S1 and S2, no S3 or S4. Rhythm is regular. No murmurs or rubs.  Bilateral radial pulses palpable Pulm: Clear to auscultation bilaterally, no crackles, wheezing, or diminished breath sounds. Normal respiratory effort, stable on RA Abdomen: Bowel sounds normal. Abdomen soft and non-tender.  Extremities: No peripheral edema. Warm/ well perfused.  Neuro: pt alert and oriented x4, 5/5 strength in bilateral upper and lower extremities, sensation in tact to light touch throughout    ASSESSMENT/PLAN:   Sinusitis, maxillary, chronic Symptoms appear to be consistent with sinus pressure HA. Will  treat for sinusitis with nasal afrin spray. Patient has no neck tenderness or rigidity to raise suspicion for meningeal irritation contributing to HA.  Discussed follow up with patient  Also given hx of DM, checked BG as potential contributor for HA      11/14/2020, MD [facility]

## 2020-10-13 DIAGNOSIS — J320 Chronic maxillary sinusitis: Secondary | ICD-10-CM | POA: Insufficient documentation

## 2020-10-13 NOTE — Assessment & Plan Note (Signed)
Symptoms appear to be consistent with sinus pressure HA. Will treat for sinusitis with nasal afrin spray. Patient has no neck tenderness or rigidity to raise suspicion for meningeal irritation contributing to HA.  Discussed follow up with patient  Also given hx of DM, checked BG as potential contributor for HA

## 2021-01-07 ENCOUNTER — Other Ambulatory Visit: Payer: Self-pay

## 2021-01-07 ENCOUNTER — Ambulatory Visit (INDEPENDENT_AMBULATORY_CARE_PROVIDER_SITE_OTHER): Payer: Medicaid Other | Admitting: Student

## 2021-01-07 ENCOUNTER — Encounter: Payer: Self-pay | Admitting: Student

## 2021-01-07 VITALS — BP 132/67 | HR 100 | Wt 219.0 lb

## 2021-01-07 DIAGNOSIS — R5383 Other fatigue: Secondary | ICD-10-CM

## 2021-01-07 DIAGNOSIS — E119 Type 2 diabetes mellitus without complications: Secondary | ICD-10-CM | POA: Diagnosis not present

## 2021-01-07 DIAGNOSIS — E611 Iron deficiency: Secondary | ICD-10-CM

## 2021-01-07 LAB — POCT GLYCOSYLATED HEMOGLOBIN (HGB A1C): HbA1c, POC (controlled diabetic range): 11.6 % — AB (ref 0.0–7.0)

## 2021-01-07 LAB — POCT HEMOGLOBIN: Hemoglobin: 9.2 g/dL — AB (ref 11–14.6)

## 2021-01-07 MED ORDER — INSULIN GLARGINE 100 UNIT/ML SOLOSTAR PEN: 10.0000 [IU] | PEN_INJECTOR | SUBCUTANEOUS | 1 refills | Status: DC

## 2021-01-07 MED ORDER — INSULIN GLARGINE 100 UNIT/ML SOLOSTAR PEN: 60.0000 [IU] | PEN_INJECTOR | Freq: Every day | SUBCUTANEOUS | 1 refills | Status: DC

## 2021-01-07 MED ORDER — INSULIN GLARGINE 100 UNIT/ML SOLOSTAR PEN: 20.0000 [IU] | PEN_INJECTOR | SUBCUTANEOUS | 1 refills | Status: DC

## 2021-01-07 MED ORDER — BLOOD GLUCOSE MONITOR KIT: PACK | 0 refills | Status: DC

## 2021-01-07 NOTE — Progress Notes (Signed)
    SUBJECTIVE:   CHIEF COMPLAINT / HPI: Menorrhagia and follow up for T2DM  PERTINENT  PMH / PSH: Type 2 diabetes, HSV, obesity, HPV positive on pap smear in 2020, depression  Menorrhagia LMP was 12/17/2020. Periods are monthly, last 7 days, goes through 6 super pads in an hour. Has severe cramps she rates at a 10/10.  She has been anemic in the past and states she is currently feeling very fatigued. It has been over 9 years since she took birth control.  In the past has had nexplanon, depo. She liked the nexplanon. She is interested in getting it again.   Type 2 diabetes mellitus Hemoglobin A1c today is 11.6, was 12.14 months ago. Does not like metformin, took it for a year in the past, gave her diarrhea and made her nauseous Was prescribed trulicity in April on 2022 but never got it.  She thinks it may have been too expensive.  She hasn't been able to check her blood sugars in over a month, lost monitor and strips when she moved about a month ago.  She has not been taking her Lantus lately but states she has been taking her short acting insulin. When she was taking her Lantus she took 10 units/day.  OBJECTIVE:   BP 132/67   Pulse 100   Wt 219 lb (99.3 kg)   SpO2 100%   BMI 41.38 kg/m    General: NAD, pleasant, able to participate in exam Cardiac: RRR, no murmurs. Respiratory: CTAB, normal effort, No wheezes, rales or rhonchi Neuro: alert, no obvious focal deficits Psych: Normal affect and mood  ASSESSMENT/PLAN:   Menorrhagia, iron deficiency anemia Patient has a hemoglobin today of 9.2.  It was 9.9 three months ago with a low MCV indicating a microcytic anemia.  Due to her report of menorrhagia, I suspect this is a contributing cause to her anemia. -Take 1 over-the-counter iron pill daily on empty stomach -CBC -TSH -Patient to schedule appointment for Nexplanon placement   Type 2 diabetes -Refer to pharmacology team to assist in optimizing treatment -Resume 10 units of  Lantus daily -Continue short acting insulin -Glucose monitor and  test strips ordered -Patient given strict precautions to call if she begins to feel by and her sugars get below 100. -Patient given strict precautions to seek help at the emergency department if she begins to feel lightheaded, experience abdominal pain, extreme fatigue.   Patient requested a work note to excuse her from work on 8/30 as she is feeling fatigued.  Considering her iron deficiency anemia I wrote her a note stating it would benefit her to have a day to rest. I also think she could use a day to get used to using her test strips again to monitor her blood glucose.  -Return for follow up in 2 weeks  Dr. Erick Alley, DO  [facility]

## 2021-01-07 NOTE — Progress Notes (Signed)
116

## 2021-01-07 NOTE — Patient Instructions (Signed)
It was great to see you! Thank you for allowing me to participate in your care!  I recommend that you always bring your medications to each appointment as this makes it easy to ensure you are on the correct medications and helps [place] not miss when refills are needed.  Our plans for today:  -Your previous labs from this past May showed you have iron deficiency anemia.  Please purchase an over-the-counter iron supplement.  Take this on empty stomach once a day. -We will recheck for anemia today -I have reordered your Lantus prescription.  Please pick this up at your pharmacy -Please call to schedule an appointment to have a Nexplanon placed at your convenience -You will be contacted by our pharmacy group to schedule an appointment to discuss medications to treat diabetes  We are checking some labs today, I will call you if they are abnormal will send you a MyChart message or a letter if they are normal.  If you do not hear about your labs in the next 2 weeks please let us know.  Take care and seek immediate care sooner if you develop any concerns.   Dr. Erick Alley, DO [facility]

## 2021-01-08 ENCOUNTER — Telehealth: Payer: Self-pay | Admitting: Student

## 2021-01-08 LAB — CBC
Hematocrit: 33.1 % — ABNORMAL LOW (ref 34.0–46.6)
Hemoglobin: 10 g/dL — ABNORMAL LOW (ref 11.1–15.9)
MCH: 21.9 pg — ABNORMAL LOW (ref 26.6–33.0)
MCHC: 30.2 g/dL — ABNORMAL LOW (ref 31.5–35.7)
MCV: 72 fL — ABNORMAL LOW (ref 79–97)
Platelets: 515 10*3/uL — ABNORMAL HIGH (ref 150–450)
RBC: 4.57 x10E6/uL (ref 3.77–5.28)
RDW: 16.4 % — ABNORMAL HIGH (ref 11.7–15.4)
WBC: 9.4 10*3/uL (ref 3.4–10.8)

## 2021-01-08 LAB — TSH: TSH: 0.641 u[IU]/mL (ref 0.450–4.500)

## 2021-01-08 NOTE — Telephone Encounter (Signed)
Spoke with patient to go over her CBC results.  Informed the patient that I have sent out a ferritin level to help determine if her anemia is iron deficiency anemia versus anemia of chronic disease.    Also clarified with patient that she is not currently taking her short acting insulin as was miscommunicated at her visit yesterday.  She has not been on any insulin for the past 2 months.  Patient was able to pick up her long-acting insulin, Lantus yesterday and will begin taking it.  She will follow-up with the pharmacy team to optimize her medication.

## 2021-01-08 NOTE — Addendum Note (Signed)
Addended by: Burley Saver E on: 01/08/2021 02:18 PM   Modules accepted: Orders

## 2021-01-08 NOTE — Telephone Encounter (Signed)
I called to check on her and help schedule her follow-up appointment.  I was able to schedule pharmacy appointment for her.  She prefers to call back to schedule her PCP f/u in the next 4 weeks.  She was appreciative of the call.

## 2021-01-11 LAB — SPECIMEN STATUS REPORT

## 2021-01-11 LAB — FERRITIN: Ferritin: 22 ng/mL (ref 15–150)

## 2021-01-15 ENCOUNTER — Encounter: Payer: Self-pay | Admitting: Student

## 2021-01-16 MED ORDER — FLUCONAZOLE 150 MG PO TABS: 150.0000 mg | ORAL_TABLET | Freq: Every day | ORAL | 0 refills | Status: DC

## 2021-01-21 ENCOUNTER — Ambulatory Visit: Payer: Medicaid Other | Admitting: Pharmacist

## 2021-01-27 NOTE — Progress Notes (Deleted)
    SUBJECTIVE:   CHIEF COMPLAINT / HPI:   Nexplanon Insertion She recently saw her PCP on 8/29 and discussed menorrhagia symptoms.  PCP discussed different birth control options with patient.  Patient presents today for Nexplanon insertion.  Her LMP was ***.   Patient denies history of blood clots, liver disease, breast cancer, undiagnosed abnormal vaginal bleeding, allergy or hypersensitivity to any of the implant materials.***  PERTINENT  PMH / PSH:  Past Medical History:  Diagnosis Date   Abnormal Pap smear    Diabetes mellitus    type II   GBS (group B streptococcus) UTI complicating pregnancy    Herpes simplex without mention of complication    Hypertension    Irregular menses    Obesity      OBJECTIVE:   There were no vitals taken for this visit. ***  General: NAD, pleasant, able to participate in exam Respiratory: Breathing comfortably on room air, no respiratory distress Extremities: no edema or cyanosis. Skin: warm and dry, no rashes noted Psych: Normal affect and mood  PRE-OP DIAGNOSIS: desired long-term, reversible contraception  POST-OP DIAGNOSIS: Same  PROCEDURE: Nexplanon  placement Performing Physician: Dr. Melba Coon Supervising Physician (if applicable): ***   PROCEDURE:  ICON :  _  Negative Site (check):       [_]      Right Arm        [_]     Left Arm        Serial # _ Sterile Preparation:    [_]      Betadine        [_]     Chloraprep          Expiration Date [_]   Insertion site was selected 8 - 10 cm from medial epicondyle and marked along with guiding site using sterile marker Procedure area was prepped and draped in a sterile fashion. *** mL of 1% lidocaine ***withORwithout*** epinephrine used for subcutaneous anesthesia. Anesthesia confirmed.  Nexplanon  trocar was inserted subcutaneously and then Nexplanon  capsule delivered subcutaneously Trocar was removed from the insertion site. Nexplanon  capsule was palpated by provider and patient to  assure satisfactory placement. Estimated blood loss of ***  mL Dressings applied:     _   Adhesive Dressing     _  Gauze/Tape     _   Biocclusive Followup: The patient tolerated the procedure well without complications.  Standard post-procedure care is explained and return precautions are given.   ASSESSMENT/PLAN:   No problem-specific Assessment & Plan notes found for this encounter.    Sabino Dick, DO [facility]

## 2021-01-28 ENCOUNTER — Ambulatory Visit: Payer: Medicaid Other | Admitting: Family Medicine

## 2021-01-29 ENCOUNTER — Encounter (HOSPITAL_COMMUNITY): Payer: Self-pay | Admitting: *Deleted

## 2021-01-29 ENCOUNTER — Other Ambulatory Visit: Payer: Self-pay

## 2021-01-29 ENCOUNTER — Emergency Department (HOSPITAL_COMMUNITY)
Admission: EM | Admit: 2021-01-29 | Discharge: 2021-01-29 | Disposition: A | Discharge status: Home / Self Care | Payer: Medicaid Other | Attending: Emergency Medicine | Admitting: Emergency Medicine

## 2021-01-29 DIAGNOSIS — Z5321 Procedure and treatment not carried out due to patient leaving prior to being seen by health care provider: Secondary | ICD-10-CM | POA: Insufficient documentation

## 2021-01-29 DIAGNOSIS — B9689 Other specified bacterial agents as the cause of diseases classified elsewhere: Secondary | ICD-10-CM | POA: Diagnosis not present

## 2021-01-29 DIAGNOSIS — B379 Candidiasis, unspecified: Secondary | ICD-10-CM | POA: Insufficient documentation

## 2021-01-29 DIAGNOSIS — N760 Acute vaginitis: Secondary | ICD-10-CM | POA: Insufficient documentation

## 2021-01-29 DIAGNOSIS — N898 Other specified noninflammatory disorders of vagina: Secondary | ICD-10-CM | POA: Diagnosis not present

## 2021-01-29 NOTE — ED Provider Notes (Signed)
Emergency Medicine Provider Triage Evaluation Note  [name] [name] , a 35 y.o. female  was evaluated in triage.  Pt complains of yeast infection.  She states she regularly gets these 3-4 times per month.  She recently changed her diabetes medications.  She is complaining of burning, itching and irritation of her vulva.  Been trying ibuprofen and OTC creams without relief.  Review of Systems  Positive: Vaginal burning, itching, irritation Negative: Dysuria, abdominal pain  Physical Exam  BP (!) 151/103   Pulse 90   Temp 98 F (36.7 C)   Resp 16   LMP 01/15/2021   SpO2 100%  Gen:   Awake, no distress   Resp:  Normal effort  MSK:   Moves extremities without difficulty  Other:    Medical Decision Making  Medically screening exam initiated at 8:49 PM.  Appropriate orders placed.  [name] [name] was informed that the remainder of the evaluation will be completed by another provider, this initial triage assessment does not replace that evaluation, and the importance of remaining in the ED until their evaluation is complete.     Jeanella Flattery 01/29/21 2049    Tegeler, Canary Brim, MD 01/29/21 2245

## 2021-01-29 NOTE — ED Triage Notes (Signed)
Pt c/o yeast infection x 3 days. Has been using OTC creams without relief. Burning, Itching and irritation.

## 2021-01-30 ENCOUNTER — Telehealth: Payer: Self-pay

## 2021-01-30 NOTE — Telephone Encounter (Signed)
Transition Care Management Unsuccessful Follow-up Telephone Call  Date of discharge and from where:  01/29/2021-[place]  Attempts:  1st Attempt  Reason for unsuccessful TCM follow-up call:  Missing or invalid number

## 2021-01-31 ENCOUNTER — Ambulatory Visit (HOSPITAL_COMMUNITY)
Admission: EM | Admit: 2021-01-31 | Discharge: 2021-01-31 | Disposition: A | Discharge status: Home / Self Care | Payer: Medicaid Other | Attending: Emergency Medicine | Admitting: Emergency Medicine

## 2021-01-31 ENCOUNTER — Encounter (HOSPITAL_COMMUNITY): Payer: Self-pay

## 2021-01-31 ENCOUNTER — Other Ambulatory Visit: Payer: Self-pay

## 2021-01-31 DIAGNOSIS — B3731 Acute candidiasis of vulva and vagina: Secondary | ICD-10-CM

## 2021-01-31 DIAGNOSIS — B373 Candidiasis of vulva and vagina: Secondary | ICD-10-CM

## 2021-01-31 MED ORDER — TRIAMCINOLONE ACETONIDE 0.1 % EX CREA: 1.0000 "application " | TOPICAL_CREAM | Freq: Two times a day (BID) | CUTANEOUS | 0 refills | Status: DC

## 2021-01-31 MED ORDER — NYSTATIN 100000 UNIT/GM EX CREA: TOPICAL_CREAM | CUTANEOUS | 0 refills | Status: DC

## 2021-01-31 MED ORDER — FLUCONAZOLE 150 MG PO TABS: ORAL_TABLET | ORAL | 0 refills | Status: DC

## 2021-01-31 NOTE — ED Provider Notes (Signed)
[place]    CSN: [id number] Arrival date & time: 01/31/21  0802      History   Chief Complaint Chief Complaint  Patient presents with   Vaginitis    HPI [name] [name] is a 35 y.o. female.   Patient here for evaluation of vaginal irritation, itching, and discharge that has been ongoing for the past several days.  Patient reports a history of yeast infections due to diabetes.  Reports recently did change medications for her diabetes.  Reports using an OTC yeast cream with minimal symptom relief.  Reports symptoms are similar to yeast infections in the past.  Denies any trauma, injury, or other precipitating event.  Denies any specific alleviating or aggravating factors.  Denies any fevers, chest pain, shortness of breath, N/V/D, numbness, tingling, weakness, abdominal pain, or headaches.    The history is provided by the patient.   Past Medical History:  Diagnosis Date   Abnormal Pap smear    Diabetes mellitus    type II   GBS (group B streptococcus) UTI complicating pregnancy    Herpes simplex without mention of complication    Hypertension    Irregular menses    Obesity     Patient Active Problem List   Diagnosis Date Noted   Sinusitis, maxillary, chronic 10/13/2020   Depression, recurrent ([place]) 03/21/2020   Hx of menorrhagia 03/15/2020   Type 2 diabetes mellitus with diabetic neuropathy, unspecified ([place]) 03/14/2020   Pneumonia due to COVID-19 virus 12/28/2019   Acute hypoxemic respiratory failure due to COVID-19 ([place]) 12/28/2019   UTI (urinary tract infection) 12/28/2019   Sepsis ([place]) 12/28/2019   DKA (diabetic ketoacidoses) 12/28/2019   Anemia 04/15/2019   Abdominal pain 03/14/2019   Screening for malignant neoplasm of cervix 12/30/2018   Type 2 diabetes mellitus without complication, without long-term current use of insulin ([place]) 12/30/2018   Chest pain 05/20/2017   History of tubal ligation    Medication refill 07/22/2016   Vaginal discharge  05/16/2016   Constipation 03/04/2016   Yeast infection 02/11/2016   Vagina, candidiasis 03/24/2014   Alopecia areata 09/29/2013   Type II diabetes mellitus, uncontrolled ([place]) 09/20/2007   Herpes simplex virus (HSV) infection 10/23/2006   Obesity 07/09/2006   RHINITIS, ALLERGIC 07/09/2006   PAPANICOLAOU SMEAR, ABNORMAL 07/09/2006    Past Surgical History:  Procedure Laterality Date   CESAREAN SECTION  01/30/2011   Procedure: CESAREAN SECTION;  Surgeon: [name], MD;  Location: [facility];  Service: Gynecology;  Laterality: N/A;   CESAREAN SECTION N/A 10/22/2014   Procedure: CESAREAN SECTION;  Surgeon: [name], MD;  Location: [facility];  Service: Obstetrics;  Laterality: N/A;   TUBAL LIGATION     WISDOM TOOTH EXTRACTION      OB History     Gravida  4   Para  2   Term  1   Preterm  1   AB  2   Living  2      SAB  2   IAB      Ectopic      Multiple  0   Live Births  2            Home Medications    Prior to Admission medications   Medication Sig Start Date End Date Taking? Authorizing Provider  fluconazole (DIFLUCAN) 150 MG tablet Take one pill today.  Take the second pill in 3 days if you are are still having symptoms. 01/31/21  Yes Tamala Julian,  Eulah Pont, NP  nystatin cream (MYCOSTATIN) Apply to affected area 2 times daily 01/31/21  Yes Pearson Forster, NP  triamcinolone cream (KENALOG) 0.1 % Apply 1 application topically 2 (two) times daily. 01/31/21  Yes Pearson Forster, NP  albuterol (VENTOLIN HFA) 108 (90 Base) MCG/ACT inhaler Inhale 2 puffs into the lungs every 4 (four) hours as needed for wheezing or shortness of breath. Patient not taking: Reported on 01/07/2021 01/01/20   Jonetta Osgood, MD  azithromycin (ZITHROMAX Z-PAK) 250 MG tablet Take as directed Patient not taking: Reported on 01/07/2021 09/16/20   Alfredia Client, PA-C  blood glucose meter kit and supplies KIT Dispense based on patient and insurance preference. Use up to four times daily as  directed. 01/07/21   Kinnie Feil, MD  Dulaglutide (TRULICITY) 1.61 WR/6.0AV SOPN Inject 0.75 mg into the skin once a week. Patient not taking: Reported on 01/07/2021 08/16/20   Leavy Cella, RPH-CPP  gabapentin (NEURONTIN) 300 MG capsule Take 1 capsule (300 mg total) by mouth at bedtime. Patient not taking: Reported on 01/07/2021 03/14/20   Richarda Osmond, MD  glucose blood (FREESTYLE TEST STRIPS) test strip 1 each by Other route See admin instructions. Check glucose 3 time per day and write result in logbook Patient not taking: Reported on 01/07/2021 01/01/20   Jonetta Osgood, MD  glucose monitoring kit (FREESTYLE) monitoring kit 1 each by Does not apply route as needed for other. Patient not taking: Reported on 01/07/2021 12/28/18   Sherene Sires, DO  insulin aspart (NOVOLOG) 100 UNIT/ML FlexPen 0-20 Units, Subcutaneous, 3 times daily with meals CBG < 70: Implement Hypoglycemia measures CBG 70 - 120: 0 units CBG 121 - 150: 3 units CBG 151 - 200: 4 units CBG 201 - 250: 7 units CBG 251 - 300: 11 units CBG 301 - 350: 15 units CBG 351 - 400: 20 units CBG > 400: call MD 04/20/20   Richarda Osmond, MD  insulin glargine (LANTUS) 100 UNIT/ML Solostar Pen Inject 10 Units into the skin every morning. 01/07/21   Precious Gilding, DO  Insulin Pen Needle 32G X 8 MM MISC Use as directed 01/01/20   Jonetta Osgood, MD  Lancets (FREESTYLE) lancets 1 each by Other route See admin instructions for 30 doses. Check glucose 3 time per day and write result in logbook 01/01/20   Jonetta Osgood, MD  ondansetron (ZOFRAN ODT) 4 MG disintegrating tablet Take 1 tablet (4 mg total) by mouth every 8 (eight) hours as needed for nausea or vomiting. Patient not taking: Reported on 01/07/2021 12/25/19   Lorayne Bender, PA-C  terconazole (TERAZOL 7) 0.4 % vaginal cream Place 1 applicator vaginally at bedtime. Patient not taking: Reported on 01/07/2021 03/21/20   Zenia Resides, MD    Family History Family  History  Problem Relation Age of Onset   Diabetes Paternal Grandmother    Hypertension Paternal Grandmother    Diabetes Maternal Grandmother    Diabetes Father    Cancer Paternal Uncle    Breast cancer Maternal Aunt    Breast cancer Paternal Aunt     Social History Social History   Tobacco Use   Smoking status: Former   Smokeless tobacco: Never  Scientific laboratory technician Use: Never used  Substance Use Topics   Alcohol use: Yes    Comment: "occassionally"   Drug use: No     Allergies   Metformin and related   Review of Systems Review of  Systems  Genitourinary:  Positive for vaginal discharge and vaginal pain. Negative for dysuria, frequency and urgency.  All other systems reviewed and are negative.   Physical Exam Triage Vital Signs ED Triage Vitals  Enc Vitals Group     BP 01/31/21 0814 131/88     Pulse Rate 01/31/21 0814 76     Resp 01/31/21 0814 14     Temp 01/31/21 0814 98.3 F (36.8 C)     Temp Source 01/31/21 0814 Oral     SpO2 01/31/21 0814 98 %     Weight --      Height --      Head Circumference --      Peak Flow --      Pain Score 01/31/21 0816 2     Pain Loc --      Pain Edu? --      Excl. in [place]? --    No data found.  Updated Vital Signs BP 131/88 (BP Location: Right Arm)   Pulse 76   Temp 98.3 F (36.8 C) (Oral)   Resp 14   LMP 01/15/2021   SpO2 98%   Visual Acuity Right Eye Distance:   Left Eye Distance:   Bilateral Distance:    Right Eye Near:   Left Eye Near:    Bilateral Near:     Physical Exam Vitals and nursing note reviewed.  Constitutional:      General: She is not in acute distress.    Appearance: Normal appearance. She is not ill-appearing, toxic-appearing or diaphoretic.  HENT:     Head: Normocephalic and atraumatic.  Eyes:     Conjunctiva/sclera: Conjunctivae normal.  Cardiovascular:     Rate and Rhythm: Normal rate.     Pulses: Normal pulses.  Pulmonary:     Effort: Pulmonary effort is normal.  Abdominal:      General: Abdomen is flat.  Genitourinary:    Comments: declines Musculoskeletal:        General: Normal range of motion.     Cervical back: Normal range of motion.  Skin:    General: Skin is warm and dry.  Neurological:     General: No focal deficit present.     Mental Status: She is alert and oriented to person, place, and time.  Psychiatric:        Mood and Affect: Mood normal.     UC Treatments / Results  Labs (all labs ordered are listed, but only abnormal results are displayed) Labs Reviewed - No data to display  EKG   Radiology No results found.  Procedures Procedures (including critical care time)  Medications Ordered in UC Medications - No data to display  Initial Impression / Assessment and Plan / UC Course  I have reviewed the triage vital signs and the nursing notes.  Pertinent labs & imaging results that were available during my care of the patient were reviewed by me and considered in my medical decision making (see chart for details).    Assessment negative for red flags or concerns.  Likely yeast vaginitis.  Take Diflucan 1 pill today and a second in 3 days if still having symptoms.  May try nystatin and triamcinolone cream twice a day as needed.  Recommend keeping skin clean and dry and avoid tight fitting clothing.  Recommend breathable fabrics such as cotton.  Follow-up with primary care for reevaluation of soon as possible. Final Clinical Impressions(s) / UC Diagnoses   Final diagnoses:  Yeast vaginitis  Discharge Instructions      Take the Diflucan 1 pill today.  You can take the second pill in 3 days if you are still having symptoms.   You can apply the nystatin and triamcinolone cream twice a day as needed for itching.    Try to keep your skin clean and dry.  Avoid tight fitting clothing.  Wear breathable fabrics such as cotton.    Follow up with your primary care provider for re-evaluation.      ED Prescriptions     Medication  Sig Dispense Auth. Provider   fluconazole (DIFLUCAN) 150 MG tablet Take one pill today.  Take the second pill in 3 days if you are are still having symptoms. 2 tablet Pearson Forster, NP   nystatin cream (MYCOSTATIN) Apply to affected area 2 times daily 30 g Pearson Forster, NP   triamcinolone cream (KENALOG) 0.1 % Apply 1 application topically 2 (two) times daily. 30 g Pearson Forster, NP      PDMP not reviewed this encounter.   Pearson Forster, NP 01/31/21 [id number]

## 2021-01-31 NOTE — Telephone Encounter (Signed)
Transition Care Management Unsuccessful Follow-up Telephone Call  Date of discharge and from where:  01/29/2021- [facility]  Attempts:  2nd Attempt  Reason for unsuccessful TCM follow-up call:  Missing or invalid number

## 2021-01-31 NOTE — Discharge Instructions (Addendum)
Take the Diflucan 1 pill today.  You can take the second pill in 3 days if you are still having symptoms.   You can apply the nystatin and triamcinolone cream twice a day as needed for itching.    Try to keep your skin clean and dry.  Avoid tight fitting clothing.  Wear breathable fabrics such as cotton.    Follow up with your primary care provider for re-evaluation.

## 2021-01-31 NOTE — ED Triage Notes (Signed)
Pt presents w/ complaints of exterior vaginal yeast infection x2d. Pt states the infection is itchy. She is using OTC yeast cream that is not effective.

## 2021-02-01 NOTE — Telephone Encounter (Signed)
Transition Care Management Unsuccessful Follow-up Telephone Call  Date of discharge and from where:  01/29/2021-[facility]   Attempts:  3rd Attempt  Reason for unsuccessful TCM follow-up call:  Missing or invalid number

## 2021-05-03 ENCOUNTER — Other Ambulatory Visit: Payer: Self-pay

## 2021-05-03 ENCOUNTER — Encounter: Payer: Self-pay | Admitting: Student

## 2021-05-03 ENCOUNTER — Ambulatory Visit (INDEPENDENT_AMBULATORY_CARE_PROVIDER_SITE_OTHER): Payer: Medicaid Other | Admitting: Student

## 2021-05-03 VITALS — BP 122/75 | HR 86 | Ht 61.0 in | Wt 215.8 lb

## 2021-05-03 DIAGNOSIS — E119 Type 2 diabetes mellitus without complications: Secondary | ICD-10-CM

## 2021-05-03 DIAGNOSIS — Z3046 Encounter for surveillance of implantable subdermal contraceptive: Secondary | ICD-10-CM | POA: Diagnosis not present

## 2021-05-03 DIAGNOSIS — Z30017 Encounter for initial prescription of implantable subdermal contraceptive: Secondary | ICD-10-CM

## 2021-05-03 LAB — POCT GLYCOSYLATED HEMOGLOBIN (HGB A1C): HbA1c, POC (controlled diabetic range): 12.5 % — AB (ref 0.0–7.0)

## 2021-05-03 LAB — POCT URINE PREGNANCY: Preg Test, Ur: NEGATIVE

## 2021-05-03 MED ORDER — ETONOGESTREL 68 MG ~~LOC~~ IMPL
68.0000 mg | DRUG_IMPLANT | Freq: Once | SUBCUTANEOUS | Status: AC
  Administered 2021-05-03: 11:00:00 68 mg via SUBCUTANEOUS

## 2021-05-03 MED ORDER — ETONOGESTREL 68 MG ~~LOC~~ IMPL: 68.0000 mg | DRUG_IMPLANT | Freq: Once | SUBCUTANEOUS | Status: AC

## 2021-05-03 MED ORDER — INSULIN GLARGINE 100 UNIT/ML SOLOSTAR PEN: 10.0000 [IU] | PEN_INJECTOR | SUBCUTANEOUS | 1 refills | Status: DC

## 2021-05-03 MED ORDER — BLOOD GLUCOSE MONITOR KIT: PACK | 0 refills | Status: DC

## 2021-05-03 NOTE — Patient Instructions (Addendum)
It was great to see you! Thank you for allowing me to participate in your care!  I recommend that you always bring your medications to each appointment as this makes it easy to ensure you are on the correct medications and helps [place] not miss when refills are needed.  Our plans for today:  - I have refilled your lantus and sent in a prescription for a glucometer. - Please inject 10 units of lantus daily and check your blood sugar daily or if you are feeling unwell -If you blood sugars are less than 100 and you feel unwell OR if they are less than 70, please STOP taking your insulin and make an appointment or go to the ED -If your blood sugars are above 180, continue taking your insulin and make a sooner apt.  -Please go to your appointment with the pharmacy team in January -You had a nexplanon inserted today for birth control which is good for 3 years.    Take care and seek immediate care sooner if you develop any concerns.   Dr. Erick Alley, DO [facility]

## 2021-05-03 NOTE — Progress Notes (Signed)
° ° °  SUBJECTIVE:   CHIEF COMPLAINT / HPI: Here for Nexplanon insertion, type 2 diabetes follow-up  Contraception Patient desires long-term reversible contraception and requests Nexplanon insertion today.  Type 2 diabetes Patient was unable to come to last 2 appointments for diabetes management, one with pharmacy team and one with a resident physician.  Patient gets frequent yeast infections likely due to high sugars.  Today pt states she sometimes misses doses of lantus and it has not been refilled since August.  Pt was unable to pick up glucometer after last visit in August.    PERTINENT  PMH / PSH: Uncontrolled type 2 diabetes  OBJECTIVE:   Vitals:   05/03/21 1020  BP: 122/75  Pulse: 86  SpO2: 100%     General: NAD, pleasant, able to participate in exam Cardiac: Well perfused Respiratory: Breathing comfortably on room air Skin: warm and dry, no rashes noted Neuro: alert, no obvious focal deficits Psych: Normal affect and mood  ASSESSMENT/PLAN:   Birth control Urine pregnancy test negative today Patient desires long-term reversible contraception Nexplanon insertion procedure: Left Arm was prepped by measuring and marking an insertion site which was 5 cm below the sulcus and 10 cm proximal to medial epicondyle of the humerus. Two alcohol wipes were used to cleanse the insertion site and 5 mL of lidocaine was injected to numb the area. 3 betadine swabs were used to cleanse the area of insertion and surrounding area with a circular motion from inside to outside.  CMA assisting with procedure accidentally grabbed sterile Nexplanon applicator to hand to me, which I did not accept as it was no longer sterile.  A new Nexplanon  was obtained and was only handled by myself with sterile gloves.  Nexplanon trocar was inserted subcutaneously and the Nexplanon capsule delivered subcutaneously.  Trocar was removed from insertion site.  Both ends of Nexplanon capsule were palpated after  insertion.  CMA applied dressing.   Type 2 diabetes Hemoglobin A1c today of 12.5, up from 11.6 on 01/07/21.  Patient has not been taking her Lantus regularly since it was last prescribed in August.  This has been reordered along with a new glucometer that patient was unable to pick up in August.  Patient states understanding of the importance to follow-up with pharmacy regarding diabetes management and states she will get her appointment with them in January. -Glucometer ordered -Lantus 10 mg every morning -If blood sugars are less than 100 and patient is symptomatic or if blood sugars are less than 70, patient should stop taking Lantus and seek care at the office or ED -If blood sugars are greater than 180, patient should continue taking Lantus and make a sooner appointment to be seen.  Dr. Erick Alley, DO [facility]

## 2021-05-22 ENCOUNTER — Other Ambulatory Visit: Payer: Self-pay

## 2021-05-22 ENCOUNTER — Ambulatory Visit (INDEPENDENT_AMBULATORY_CARE_PROVIDER_SITE_OTHER): Payer: Medicaid Other | Admitting: Pharmacist

## 2021-05-22 ENCOUNTER — Ambulatory Visit (INDEPENDENT_AMBULATORY_CARE_PROVIDER_SITE_OTHER): Payer: Medicaid Other

## 2021-05-22 DIAGNOSIS — Z111 Encounter for screening for respiratory tuberculosis: Secondary | ICD-10-CM | POA: Diagnosis not present

## 2021-05-22 DIAGNOSIS — E119 Type 2 diabetes mellitus without complications: Secondary | ICD-10-CM

## 2021-05-22 MED ORDER — ACCU-CHEK GUIDE VI STRP: ORAL_STRIP | 12 refills | Status: DC

## 2021-05-22 MED ORDER — ACCU-CHEK SOFTCLIX LANCETS MISC: 12 refills | Status: DC

## 2021-05-22 MED ORDER — OZEMPIC (0.25 OR 0.5 MG/DOSE) 2 MG/1.5ML ~~LOC~~ SOPN: 0.2500 mg | PEN_INJECTOR | SUBCUTANEOUS | 0 refills | Status: DC

## 2021-05-22 MED ORDER — ACCU-CHEK GUIDE W/DEVICE KIT: PACK | 0 refills | Status: DC

## 2021-05-22 MED ORDER — PEN NEEDLES 32G X 4 MM MISC: 12 refills | Status: AC

## 2021-05-22 NOTE — Progress Notes (Signed)
Subjective:    Patient ID: [name] [name], female    DOB: Aug 19, 1985, 36 y.o.   MRN: [id number]  HPI Patient is a 36 y.o. female who presents for diabetes management. She is in good spirits and presents without assistance. Patient was referred and last seen by Primary Care Provider on 05/03/21.  Insurance coverage/medication affordability: Medicaid  Current diabetes medications include: none, patient unable to obtain Lantus from pharmacy Patient states that She is not taking her medications as prescribed. Patient denies adherence with medications. Patient states that She misses her medications 2 times per week, on average.  Do you feel that your medications are working for you?  Unable to assess  Have you been experiencing any side effects to the medications prescribed? N/a  Do you have any problems obtaining medications due to transportation or finances?  Yes, issues obtaining Lantus from pharmacy   Patient denies hypoglycemic events. Patient reports polyuria (increased urination).  Patient denies polyphagia (increased appetite).  Patient denies polydipsia (increased thirst).  Patient denies neuropathy (nerve pain). Patient reports visual changes. Patient reports self foot exams.   Patient unable to check as she does not have glucometer  Objective:   Labs:   Physical Exam Neurological:     Mental Status: She is alert and oriented to person, place, and time.    Review of Systems  Gastrointestinal:  Negative for nausea and vomiting.  Genitourinary:  Positive for frequency.   Lab Results  Component Value Date   HGBA1C 12.5 (A) 05/03/2021   HGBA1C 11.6 (A) 01/07/2021   HGBA1C 12.1 (A) 08/16/2020    Lab Results  Component Value Date   MICRALBCREAT 19 08/16/2020    Lipid Panel     Component Value Date/Time   CHOL 142 05/20/2017 0915   TRIG 71 05/20/2017 0915   HDL 42 05/20/2017 0915   CHOLHDL 3.4 05/20/2017 0915   CHOLHDL 3.4 08/02/2013 1518   VLDL 20  08/02/2013 1518   LDLCALC 86 05/20/2017 0915   LDLDIRECT 98 07/01/2012 0954    Clinical Atherosclerotic Cardiovascular Disease (ASCVD): No  The ASCVD Risk score (Arnett DK, et al., 2019) failed to calculate for the following reasons:   The 2019 ASCVD risk score is only valid for ages 29 to 54    Assessment/Plan:   T2DM is not controlled likely due to issues with accessing medications. Medication adherence appears suboptimal. Unable to assess blood glucose readings as patient was unable to start medications and pick up glucometer due to issues with pharmacy. Provided patient sample of Lantus to start 10 units once daily. Also sent in prescription for Ozempic 0.25mg  once weekly. Patient educated on purpose, proper use and potential adverse effects of Lantus and Ozempic.  Following instruction patient verbalized understanding of treatment plan.    Gave samples of basal insulin Lantus 10 units once daily  Started GLP-1 Ozempic 0.25mg  once weekly  Sent in new prescription for glucometer and supplies Extensively discussed pathophysiology of diabetes, dietary effects on blood sugar control, and recommended lifestyle interventions. Counseled on s/sx of and management of hypoglycemia Next A1C anticipated March 2023.   Follow-up appointment three weeks to review sugar readings. Written patient instructions provided.  This appointment required 30 minutes of direct patient care.  Thank you for involving pharmacy to assist in providing this patient's care.  Medication Samples have been provided to the patient.  Drug name: Lantus       Strength: 100 units/mL        Qty: 1  pen  LOT: [id number]  Exp.Date: 06/11/21   Cheral Almas 2:30 PM 05/22/2021

## 2021-05-22 NOTE — Patient Instructions (Signed)
Miss Berling it was a pleasure seeing you today.   Please do the following:  Start Ozempic 0.25mg  once weekly and start Lantus 10 units once daily as directed today during your appointment. If you have any questions or if you believe something has occurred because of this change, call me or your doctor to let one of [place] know.  Continue checking blood sugars at home. It's really important that you record these and bring these in to your next doctor's appointment.  Continue making the lifestyle changes we've discussed together during our visit. Diet and exercise play a significant role in improving your blood sugars.  Follow-up with me in three weeks.    Hypoglycemia or low blood sugar:   Low blood sugar can happen quickly and may become an emergency if not treated right away.   While this shouldn't happen often, it can be brought upon if you skip a meal or do not eat enough. Also, if your insulin or other diabetes medications are dosed too high, this can cause your blood sugar to go to low.   Warning signs of low blood sugar include: Feeling shaky or dizzy Feeling weak or tired  Excessive hunger Feeling anxious or upset  Sweating even when you aren't exercising  What to do if I experience low blood sugar? Follow the Rule of 15 Check your blood sugar with your meter. If lower than 70, proceed to step 2.  Treat with 15 grams of fast acting carbs which is found in 3-4 glucose tablets. If none are available you can try hard candy, 1 tablespoon of sugar or honey,4 ounces of fruit juice, or 6 ounces of REGULAR soda.  Re-check your sugar in 15 minutes. If it is still below 70, do what you did in step 2 again. If your blood sugar has come back up, go ahead and eat a snack or small meal made up of complex carbs (ex. Whole grains) and protein at this time to avoid recurrence of low blood sugar.

## 2021-05-23 NOTE — Progress Notes (Signed)
Patient is here for a PPD placement.  PPD placed in left forearm @ 3:00 pm.  Patient will return 05/24/2021 to have PPD read. Veronda Prude, RN

## 2021-05-24 ENCOUNTER — Telehealth: Payer: Self-pay | Admitting: Student

## 2021-05-24 ENCOUNTER — Ambulatory Visit: Payer: Medicaid Other

## 2021-05-24 ENCOUNTER — Other Ambulatory Visit: Payer: Self-pay

## 2021-05-24 DIAGNOSIS — Z111 Encounter for screening for respiratory tuberculosis: Secondary | ICD-10-CM

## 2021-05-24 LAB — TB SKIN TEST
Induration: 0 mm
TB Skin Test: NEGATIVE

## 2021-05-24 NOTE — Progress Notes (Signed)
PPD RESULTS: Result: Negative Induration: 0 mm Allergic reaction: No

## 2021-05-24 NOTE — Telephone Encounter (Signed)
Patient came in stating that her prescriptions that were sent in on Wednesday by Dr. Nicholaus Bloom were not ready yet. They were for her insulin and meter.

## 2021-05-27 NOTE — Telephone Encounter (Signed)
Called and spoke to pharmacy at [facility] concerning medication for patient. Was informed that Ozempic requires a pre authorization.  Lawernce Keas states that she will refax the sheet again to [facility].  Spoke to [place] and she will work on the Engineer, water.  Glennie Hawk, CMA

## 2021-05-28 NOTE — Telephone Encounter (Signed)
Received fax from pharmacy, PA needed on Ozempic.  Clinical questions submitted via Cover My Meds.  Waiting on response, could take up to 72 hours.  Cover My Meds info: Key: [id number]  Veronda Prude, RN

## 2021-05-28 NOTE — Telephone Encounter (Signed)
Medication approved through 05/28/2022.  Called pharmacy with approval. Medication cost will be $4.   Called patient with update.   Veronda Prude, RN

## 2021-06-03 NOTE — Assessment & Plan Note (Signed)
>>  ASSESSMENT AND PLAN FOR TYPE 2 DIABETES MELLITUS WITHOUT COMPLICATION, WITHOUT LONG-TERM CURRENT USE OF INSULIN  (HCC) WRITTEN ON 06/03/2021  9:56 AM BY KELLEY, RACHELLE, RPH-CPP  T2DM is not controlled likely due to issues with accessing medications. Medication adherence appears suboptimal. Unable to assess blood glucose readings as patient was unable to start medications and pick up glucometer due to issues with pharmacy. Provided patient sample of Lantus  to start 10 units once daily. Also sent in prescription for Ozempic  0.25mg  once weekly. Patient educated on purpose, proper use and potential adverse effects of Lantus  and Ozempic .  Following instruction patient verbalized understanding of treatment plan.    Gave samples of basal insulin  Lantus  10 units once daily  Started GLP-1 Ozempic  0.25mg  once weekly  Extensively discussed pathophysiology of diabetes, dietary effects on blood sugar control, and recommended lifestyle interventions. Counseled on s/sx of and management of hypoglycemia Next A1C anticipated March 2023.

## 2021-06-03 NOTE — Assessment & Plan Note (Signed)
T2DM is not controlled likely due to issues with accessing medications. Medication adherence appears suboptimal. Unable to assess blood glucose readings as patient was unable to start medications and pick up glucometer due to issues with pharmacy. Provided patient sample of Lantus to start 10 units once daily. Also sent in prescription for Ozempic 0.25mg  once weekly. Patient educated on purpose, proper use and potential adverse effects of Lantus and Ozempic.  Following instruction patient verbalized understanding of treatment plan.    1. Gave samples of basal insulin Lantus 10 units once daily  2. Started GLP-1 Ozempic 0.25mg  once weekly  3. Extensively discussed pathophysiology of diabetes, dietary effects on blood sugar control, and recommended lifestyle interventions. 4. Counseled on s/sx of and management of hypoglycemia 5. Next A1C anticipated March 2023.

## 2021-06-12 ENCOUNTER — Ambulatory Visit: Payer: Medicaid Other | Admitting: Pharmacist

## 2021-06-17 ENCOUNTER — Encounter (HOSPITAL_COMMUNITY): Payer: Self-pay

## 2021-06-17 ENCOUNTER — Ambulatory Visit: Payer: Medicaid Other | Admitting: Pharmacist

## 2021-06-17 ENCOUNTER — Other Ambulatory Visit: Payer: Self-pay

## 2021-06-17 ENCOUNTER — Ambulatory Visit (HOSPITAL_COMMUNITY)
Admission: EM | Admit: 2021-06-17 | Discharge: 2021-06-17 | Disposition: A | Discharge status: Home / Self Care | Payer: Medicaid Other | Attending: Family Medicine | Admitting: Family Medicine

## 2021-06-17 DIAGNOSIS — S0083XA Contusion of other part of head, initial encounter: Secondary | ICD-10-CM | POA: Diagnosis not present

## 2021-06-17 DIAGNOSIS — B3731 Acute candidiasis of vulva and vagina: Secondary | ICD-10-CM

## 2021-06-17 MED ORDER — IBUPROFEN 800 MG PO TABS: 800.0000 mg | ORAL_TABLET | Freq: Three times a day (TID) | ORAL | 0 refills | Status: DC | PRN

## 2021-06-17 MED ORDER — FLUCONAZOLE 150 MG PO TABS: 150.0000 mg | ORAL_TABLET | Freq: Once | ORAL | 0 refills | Status: AC

## 2021-06-17 NOTE — ED Provider Notes (Signed)
[facility]    CSN: [id number] Arrival date & time: 06/17/21  9381      History   Chief Complaint Chief Complaint  Patient presents with   Vaginitis   face numbness    HPI [name] [name] is a 36 y.o. female.   HPI Here for white vaginal discharge and vaginal itching.  She states it is consistent with her usual yeast vaginitis that she gets often due to her diabetes not being well controlled.  No fever and no dysuria  She also slipped on the soapy area in the shower yesterday and hit her right jaw on the water faucet.  It is swollen and painful there and it feels numb around that area.  She has been able to open and close her mouth and due to.  She does chew on the other side though   Past Medical History:  Diagnosis Date   Abnormal Pap smear    Diabetes mellitus    type II   GBS (group B streptococcus) UTI complicating pregnancy    Herpes simplex without mention of complication    Hypertension    Irregular menses    Obesity     Patient Active Problem List   Diagnosis Date Noted   Sinusitis, maxillary, chronic 10/13/2020   Depression, recurrent ([place]) 03/21/2020   Hx of menorrhagia 03/15/2020   Type 2 diabetes mellitus with diabetic neuropathy, unspecified ([place]) 03/14/2020   Pneumonia due to COVID-19 virus 12/28/2019   Acute hypoxemic respiratory failure due to COVID-19 ([place]) 12/28/2019   UTI (urinary tract infection) 12/28/2019   Sepsis ([place]) 12/28/2019   DKA (diabetic ketoacidoses) 12/28/2019   Anemia 04/15/2019   Abdominal pain 03/14/2019   Screening for malignant neoplasm of cervix 12/30/2018   Type 2 diabetes mellitus without complication, without long-term current use of insulin ([place]) 12/30/2018   Chest pain 05/20/2017   History of tubal ligation    Medication refill 07/22/2016   Vaginal discharge 05/16/2016   Constipation 03/04/2016   Yeast infection 02/11/2016   Vagina, candidiasis 03/24/2014   Alopecia areata 09/29/2013   Type II diabetes  mellitus, uncontrolled 09/20/2007   Herpes simplex virus (HSV) infection 10/23/2006   Obesity 07/09/2006   RHINITIS, ALLERGIC 07/09/2006   PAPANICOLAOU SMEAR, ABNORMAL 07/09/2006    Past Surgical History:  Procedure Laterality Date   CESAREAN SECTION  01/30/2011   Procedure: CESAREAN SECTION;  Surgeon: [name], MD;  Location: [facility];  Service: Gynecology;  Laterality: N/A;   CESAREAN SECTION N/A 10/22/2014   Procedure: CESAREAN SECTION;  Surgeon: [name], MD;  Location: [facility];  Service: Obstetrics;  Laterality: N/A;   TUBAL LIGATION     WISDOM TOOTH EXTRACTION      OB History     Gravida  4   Para  2   Term  1   Preterm  1   AB  2   Living  2      SAB  2   IAB      Ectopic      Multiple  0   Live Births  2            Home Medications    Prior to Admission medications   Medication Sig Start Date End Date Taking? Authorizing Provider  fluconazole (DIFLUCAN) 150 MG tablet Take 1 tablet (150 mg total) by mouth once for 1 dose. And you can repeat the dose in 3 days if you still have symptoms 06/17/21 06/17/21  Yes Barrett Henle, MD  ibuprofen (ADVIL) 800 MG tablet Take 1 tablet (800 mg total) by mouth every 8 (eight) hours as needed (pain). 06/17/21  Yes Barrett Henle, MD  Accu-Chek Softclix Lancets lancets Use as directed to check blood glucose once daily 05/22/21   Lenoria Chime, MD  albuterol (VENTOLIN HFA) 108 (90 Base) MCG/ACT inhaler Inhale 2 puffs into the lungs every 4 (four) hours as needed for wheezing or shortness of breath. Patient not taking: Reported on 01/07/2021 01/01/20   Jonetta Osgood, MD  blood glucose meter kit and supplies KIT Dispense based on patient and insurance preference. Use up to four times daily as directed. 05/03/21   Precious Gilding, DO  Blood Glucose Monitoring Suppl (ACCU-CHEK GUIDE) w/Device KIT Use as directed to check blood glucose once daily 05/22/21   Lenoria Chime, MD  glucose blood (ACCU-CHEK GUIDE)  test strip Use as directed to check blood glucose once daily 05/22/21   Lenoria Chime, MD  insulin aspart (NOVOLOG) 100 UNIT/ML FlexPen 0-20 Units, Subcutaneous, 3 times daily with meals CBG < 70: Implement Hypoglycemia measures CBG 70 - 120: 0 units CBG 121 - 150: 3 units CBG 151 - 200: 4 units CBG 201 - 250: 7 units CBG 251 - 300: 11 units CBG 301 - 350: 15 units CBG 351 - 400: 20 units CBG > 400: call MD Patient not taking: Reported on 05/22/2021 04/20/20   Richarda Osmond, MD  insulin glargine (LANTUS) 100 UNIT/ML Solostar Pen Inject 10 Units into the skin every morning. Patient not taking: Reported on 05/22/2021 05/03/21   Precious Gilding, DO  Insulin Pen Needle (PEN NEEDLES) 32G X 4 MM MISC Use to inject insulin once daily 05/22/21   Lenoria Chime, MD  Lancets (FREESTYLE) lancets 1 each by Other route See admin instructions for 30 doses. Check glucose 3 time per day and write result in logbook 01/01/20   Jonetta Osgood, MD  nystatin cream (MYCOSTATIN) Apply to affected area 2 times daily Patient not taking: Reported on 05/22/2021 01/31/21   Pearson Forster, NP  Semaglutide,0.25 or 0.5MG/DOS, (OZEMPIC, 0.25 OR 0.5 MG/DOSE,) 2 MG/1.5ML SOPN Inject 0.25 mg into the skin once a week. After 4 doses increase to 0.52m once weekly 05/22/21   PLenoria Chime MD    Family History Family History  Problem Relation Age of Onset   Diabetes Paternal Grandmother    Hypertension Paternal Grandmother    Diabetes Maternal Grandmother    Diabetes Father    Cancer Paternal Uncle    Breast cancer Maternal Aunt    Breast cancer Paternal Aunt     Social History Social History   Tobacco Use   Smoking status: Former   Smokeless tobacco: Never  VScientific laboratory technicianUse: Never used  Substance Use Topics   Alcohol use: Yes    Comment: "occassionally"   Drug use: No     Allergies   Metformin and related   Review of Systems Review of Systems   Physical Exam Triage Vital Signs ED  Triage Vitals [06/17/21 0942]  Enc Vitals Group     BP 129/84     Pulse Rate 84     Resp 16     Temp 98.7 F (37.1 C)     Temp Source Oral     SpO2 100 %     Weight      Height      Head Circumference  Peak Flow      Pain Score      Pain Loc      Pain Edu?      Excl. in [place]?    No data found.  Updated Vital Signs BP 129/84 (BP Location: Left Arm)    Pulse 84    Temp 98.7 F (37.1 C) (Oral)    Resp 16    LMP 06/10/2021    SpO2 100%   Visual Acuity Right Eye Distance:   Left Eye Distance:   Bilateral Distance:    Right Eye Near:   Left Eye Near:    Bilateral Near:     Physical Exam Constitutional:      General: She is not in acute distress.    Appearance: She is not toxic-appearing.  HENT:     Head:     Comments: There is some tenderness and soft tissue swelling along the right jaw and under right jaw. No erythema or warmth    Nose: Nose normal.     Mouth/Throat:     Mouth: Mucous membranes are moist.     Pharynx: No oropharyngeal exudate or posterior oropharyngeal erythema.  Eyes:     Extraocular Movements: Extraocular movements intact.     Pupils: Pupils are equal, round, and reactive to light.  Cardiovascular:     Rate and Rhythm: Normal rate and regular rhythm.  Pulmonary:     Effort: Pulmonary effort is normal.     Breath sounds: Normal breath sounds.  Skin:    Coloration: Skin is not jaundiced or pale.  Neurological:     Mental Status: She is alert and oriented to person, place, and time.  Psychiatric:        Behavior: Behavior normal.     UC Treatments / Results  Labs (all labs ordered are listed, but only abnormal results are displayed) Labs Reviewed - No data to display  EKG   Radiology No results found.  Procedures Procedures (including critical care time)  Medications Ordered in UC Medications - No data to display  Initial Impression / Assessment and Plan / UC Course  I have reviewed the triage vital signs and the nursing  notes.  Pertinent labs & imaging results that were available during my care of the patient were reviewed by me and considered in my medical decision making (see chart for details).     She declines testing for other causes of vaginal discharge.  We will treat with Diflucan as she usually takes.  She will be seeing the pharmacist at her primary clinic later today.  We do not have the ability of doing jaw or Panorex x-rays here in our clinic.  We will treat with some ibuprofen and ice. Final Clinical Impressions(s) / UC Diagnoses   Final diagnoses:  Yeast vaginitis  Contusion of jawline, initial encounter     Discharge Instructions      Take fluconazole 150 mg 1 time.  After 3 more days if you are still having yeast symptoms, you can take the second dose.  Keep your appointments with your primary clinic about your diabetes.  Ice the sore area along the jaw.  Take ibuprofen 800 mg 1 every 8 hours as needed for pain.     ED Prescriptions     Medication Sig Dispense Auth. Provider   fluconazole (DIFLUCAN) 150 MG tablet Take 1 tablet (150 mg total) by mouth once for 1 dose. And you can repeat the dose in 3 days if you still  have symptoms 2 tablet Windy Carina, Gwenlyn Perking, MD   ibuprofen (ADVIL) 800 MG tablet Take 1 tablet (800 mg total) by mouth every 8 (eight) hours as needed (pain). 21 tablet , Gwenlyn Perking, MD      PDMP not reviewed this encounter.   Barrett Henle, MD 06/17/21 1000

## 2021-06-17 NOTE — ED Triage Notes (Signed)
Pt thinks she has a yeast infection due to her being diabetic.  Pt reports falling in the shower yesterday and hitting her face. She reports her face is numb.

## 2021-06-17 NOTE — Discharge Instructions (Addendum)
Take fluconazole 150 mg 1 time.  After 3 more days if you are still having yeast symptoms, you can take the second dose.  Keep your appointments with your primary clinic about your diabetes.  Ice the sore area along the jaw.  Take ibuprofen 800 mg 1 every 8 hours as needed for pain.

## 2021-06-20 ENCOUNTER — Ambulatory Visit (INDEPENDENT_AMBULATORY_CARE_PROVIDER_SITE_OTHER): Payer: Medicaid Other

## 2021-06-20 ENCOUNTER — Ambulatory Visit (INDEPENDENT_AMBULATORY_CARE_PROVIDER_SITE_OTHER): Payer: Medicaid Other | Admitting: Family Medicine

## 2021-06-20 ENCOUNTER — Other Ambulatory Visit: Payer: Self-pay

## 2021-06-20 VITALS — BP 129/86 | HR 87 | Ht 61.0 in | Wt 209.5 lb

## 2021-06-20 DIAGNOSIS — Z23 Encounter for immunization: Secondary | ICD-10-CM

## 2021-06-20 DIAGNOSIS — E119 Type 2 diabetes mellitus without complications: Secondary | ICD-10-CM | POA: Diagnosis not present

## 2021-06-20 NOTE — Assessment & Plan Note (Signed)
>>  ASSESSMENT AND PLAN FOR TYPE 2 DIABETES MELLITUS WITHOUT COMPLICATION, WITHOUT LONG-TERM CURRENT USE OF INSULIN  (HCC) WRITTEN ON 06/20/2021  5:05 PM BY LILLAND, ALANA, DO  Patient not able to obtain Ozempic  due to shortage at the pharmacy.  Counseled patient to call other [facility] pharmacies and see if there is any in stock there.  Patient states that she is taking her Lantus . - Patient given Dr. Florinda Hush information for nutrition counseling for type 2 diabetes - Patient to follow-up if unable to obtain Ozempic

## 2021-06-20 NOTE — Assessment & Plan Note (Signed)
Patient not able to obtain Ozempic due to shortage at the pharmacy.  Counseled patient to call other [facility] pharmacies and see if there is any in stock there.  Patient states that she is taking her Lantus. - Patient given Dr. Jenne Campus information for nutrition counseling for type 2 diabetes - Patient to follow-up if unable to obtain Ozempic

## 2021-06-20 NOTE — Patient Instructions (Addendum)
It was so great seeing you today! Today we discussed the following:  -I recommend talking with our nutritionist, her contact information is attached  -We recommend getting the MMR vaccine, please come back in about a week for a nurse visit to get that done and we will let you get the records.    Please make sure to bring any medications you take to your appointments. If you have any questions or concerns please call the office at [phone].

## 2021-06-20 NOTE — Progress Notes (Signed)
° ° °  SUBJECTIVE:   CHIEF COMPLAINT / HPI:   Physical for job Patient notes that she needs paperwork filled out for a physical for her job.  She has no current limitations physically and has no communicable diseases that are known.  Had a TB test that was completed last month.  Type 2 diabetes Patient reports that she has her insulin.  She was attempted to get Ozempic, but the pharmacy does not have any in stock and is unsure of when it will be available.  PERTINENT  PMH / PSH: Reviewed  OBJECTIVE:   BP 129/86    Pulse 87    Ht _0  (1.549 m)    Wt 209 lb 8 oz (95 kg)    LMP 06/10/2021    SpO2 99%    BMI 39.58 kg/m   Gen: well-appearing, NAD CV: RRR, no m/r/g appreciated, no peripheral edema Pulm: CTAB, no wheezes/crackles GI: soft, non-tender, non-distended  ASSESSMENT/PLAN:   Type 2 diabetes mellitus without complication, without long-term current use of insulin ([place]) Patient not able to obtain Ozempic due to shortage at the pharmacy.  Counseled patient to call other [place] pharmacies and see if there is any in stock there.  Patient states that she is taking her Lantus. - Patient given Dr. Jenne Campus information for nutrition counseling for type 2 diabetes - Patient to follow-up if unable to obtain Ozempic   Form completion for job physical Form was completed for the patient.  Did note on the form that the patient does not have any documented MMR vaccinations, patient received tetanus/COVID/flu vaccines today.  We will hold off on giving the MMR vaccine until next week, nursing appointment made. - MMR vaccine to be given next week.   [name] [name], [place]

## 2021-06-27 ENCOUNTER — Other Ambulatory Visit: Payer: Self-pay

## 2021-06-27 ENCOUNTER — Ambulatory Visit (INDEPENDENT_AMBULATORY_CARE_PROVIDER_SITE_OTHER): Payer: Medicaid Other

## 2021-06-27 DIAGNOSIS — Z23 Encounter for immunization: Secondary | ICD-10-CM | POA: Diagnosis not present

## 2021-06-27 LAB — POCT URINE PREGNANCY: Preg Test, Ur: NEGATIVE

## 2021-06-28 NOTE — Progress Notes (Signed)
Patient presents to nurse clinic for MMR vaccination, per Dr. Marica Otter orders.   Spoke with Dr. Andria Frames regarding patient receiving two dose series vs one dose. 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   Urine pregnancy obtained and was negative, patient also has nexplanon that was placed in Dec. 2023.   Administered in right arm, SQ. Patient tolerated injection well. Scheduled for next appointment on 3/17.  Talbot Grumbling, RN

## 2021-07-26 ENCOUNTER — Ambulatory Visit: Payer: Medicaid Other

## 2021-07-29 ENCOUNTER — Ambulatory Visit (INDEPENDENT_AMBULATORY_CARE_PROVIDER_SITE_OTHER): Payer: Medicaid Other

## 2021-07-29 ENCOUNTER — Other Ambulatory Visit: Payer: Self-pay

## 2021-07-29 DIAGNOSIS — Z23 Encounter for immunization: Secondary | ICD-10-CM

## 2021-07-29 NOTE — Progress Notes (Signed)
Patient presents to nurse clinic for second MMR. ? ?Administered in right arm, SQ. Site unremarkable, pt tolerated injection well.  ? ?Talbot Grumbling, RN ? ?

## 2021-07-31 ENCOUNTER — Encounter: Payer: Self-pay | Admitting: Student

## 2021-08-01 ENCOUNTER — Other Ambulatory Visit: Payer: Self-pay | Admitting: Student

## 2021-08-01 MED ORDER — FLUCONAZOLE 150 MG PO TABS: 150.0000 mg | ORAL_TABLET | Freq: Once | ORAL | 0 refills | Status: AC

## 2021-08-01 NOTE — Progress Notes (Signed)
Spoke with pt on the phone regarding symptoms of vaginal yeast infection. Pt complains of thick white chunky discharge and itchiness. Sent in diflucan 150 mg 2 tablets with instructions to take one and if in 3 days symptoms persist take the second.  ? ?Recurrent yeast infections likely d/t uncontrolled T2DM. Pt had been following with pharmacy team but missed last two appointments. I scheduled an appointment for her for 08/05/21 with Dr. Raymondo Band.  ?

## 2021-08-17 ENCOUNTER — Emergency Department (HOSPITAL_COMMUNITY)
Admission: EM | Admit: 2021-08-17 | Discharge: 2021-08-17 | Disposition: A | Discharge status: Home / Self Care | Payer: Medicaid Other | Attending: Emergency Medicine | Admitting: Emergency Medicine

## 2021-08-17 DIAGNOSIS — I10 Essential (primary) hypertension: Secondary | ICD-10-CM | POA: Diagnosis not present

## 2021-08-17 DIAGNOSIS — Z041 Encounter for examination and observation following transport accident: Secondary | ICD-10-CM | POA: Diagnosis not present

## 2021-08-17 DIAGNOSIS — Z794 Long term (current) use of insulin: Secondary | ICD-10-CM | POA: Diagnosis not present

## 2021-08-17 DIAGNOSIS — R531 Weakness: Secondary | ICD-10-CM | POA: Insufficient documentation

## 2021-08-17 DIAGNOSIS — R202 Paresthesia of skin: Secondary | ICD-10-CM | POA: Diagnosis not present

## 2021-08-17 MED ORDER — IBUPROFEN 600 MG PO TABS: 600.0000 mg | ORAL_TABLET | Freq: Four times a day (QID) | ORAL | 0 refills | Status: DC | PRN

## 2021-08-17 MED ORDER — METHOCARBAMOL 500 MG PO TABS: 500.0000 mg | ORAL_TABLET | Freq: Two times a day (BID) | ORAL | 0 refills | Status: DC
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
## 2021-08-17 NOTE — ED Notes (Signed)
E-signature pad unavailable at time of pt discharge. This RN discussed discharge materials with pt and answered all pt questions. Pt stated understanding of discharge material. ? ?

## 2021-08-19 ENCOUNTER — Telehealth: Payer: Self-pay

## 2021-08-19 NOTE — Telephone Encounter (Signed)
Transition Care Management Follow-up Telephone Call ?Date of discharge and from where: 08/17/2021-  ?How have you been since you were released from the hospital? Pt stated she is doing fine and will pick up medications from the pharmacy today.  ?Any questions or concerns? No ? ?Items Reviewed: ?Did the pt receive and understand the discharge instructions provided? Yes  ?Medications obtained and verified? Yes  ?Other? No  ?Any new allergies since your discharge? No  ?Dietary orders reviewed? No ?Do you have support at home? Yes  ? ?Home Care and Equipment/Supplies: ?Were home health services ordered? not applicable ?If so, what is the name of the agency? N/A  ?Has the agency set up a time to come to the patient's home? not applicable ?Were any new equipment or medical supplies ordered?  No ?What is the name of the medical supply agency? N/A ?Were you able to get the supplies/equipment? not applicable ?Do you have any questions related to the use of the equipment or supplies? No ? ?Functional Questionnaire: (I = Independent and D = Dependent) ?ADLs: I ? ?Bathing/Dressing- I ? ?Meal Prep- I ? ?Eating- I ? ?Maintaining continence- I ? ?Transferring/Ambulation- I ? ?Managing Meds- I ? ?Follow up appointments reviewed: ? ?PCP Hospital f/u appt confirmed? No   ?Specialist Hospital f/u appt confirmed? No   ?Are transportation arrangements needed? No  ?If their condition worsens, is the pt aware to call PCP or go to the Emergency Dept.? Yes ?Was the patient provided with contact information for the PCP's office or ED? Yes ?Was to pt encouraged to call back with questions or concerns? Yes  ?

## 2021-08-22 ENCOUNTER — Other Ambulatory Visit: Payer: Self-pay

## 2021-08-22 ENCOUNTER — Ambulatory Visit (INDEPENDENT_AMBULATORY_CARE_PROVIDER_SITE_OTHER): Payer: Medicaid Other | Admitting: Family Medicine

## 2021-08-22 VITALS — BP 118/88 | HR 93 | Wt 203.4 lb

## 2021-08-22 DIAGNOSIS — E119 Type 2 diabetes mellitus without complications: Secondary | ICD-10-CM | POA: Diagnosis not present

## 2021-08-22 DIAGNOSIS — N938 Other specified abnormal uterine and vaginal bleeding: Secondary | ICD-10-CM

## 2021-08-22 LAB — POCT HEMOGLOBIN: Hemoglobin: 10.1 g/dL — AB (ref 11–14.6)

## 2021-08-22 LAB — POCT GLYCOSYLATED HEMOGLOBIN (HGB A1C): HbA1c, POC (controlled diabetic range): 12.6 % — AB (ref 0.0–7.0)

## 2021-08-22 MED ORDER — INSULIN GLARGINE 100 UNIT/ML SOLOSTAR PEN: 15.0000 [IU] | PEN_INJECTOR | SUBCUTANEOUS | 1 refills | Status: DC

## 2021-08-22 MED ORDER — NORGESTIMATE-ETH ESTRADIOL 0.25-35 MG-MCG PO TABS: 1.0000 | ORAL_TABLET | Freq: Every day | ORAL | 11 refills | Status: DC

## 2021-08-22 NOTE — Progress Notes (Signed)
? ? ?SUBJECTIVE:  ? ?CHIEF COMPLAINT / HPI:  ? ?Menstrual bleeding ?Experiencing 3 weeks of menstrual bleeding.  Prior to this she was amenorrheic.  Nexplanon placed 05/03/2021 to decrease menstrual bleeding.  She is having to wear a tampon and a pad and changing out 4-5 times daily.  She denies vaginal odor, discharge, discharge, lightheadedness or dizziness, shortness of breath or chest pain.  She denies new sexual partner.  She does however endorse bilateral lower abdominal discomfort. ? ?Diabetes ?Current Regimen: Lantus 10 units daily, Ozempic 0.5 mg weekly ?CBGs: She is not checking her blood sugars at home, her reasoning being as the finger pricks are hurting.  She does endorse breaking her finger at the finger pad and not on the lateral side of the finger. ?Last A1c: 12.5 on 05/03/2021 ?Denies polyuria, polydipsia, hypoglycemia ?Last Eye Exam: 2015, needs to schedule ? ?PERTINENT  PMH / PSH: History of tubal ligation 2016, history of hemorrhagic cyst 2012 ? ?OBJECTIVE:  ? ?BP 118/88   Pulse 93   Wt 203 lb 6.4 oz (92.3 kg)   SpO2 100%   BMI 38.43 kg/m?   ?Results for orders placed or performed in visit on 08/22/21 (from the past 24 hour(s))  ?HgB A1c     Status: Abnormal  ? Collection Time: 08/22/21  9:40 AM  ?Result Value Ref Range  ? Hemoglobin A1C    ? HbA1c POC (<> result, manual entry)    ? HbA1c, POC (prediabetic range)    ? HbA1c, POC (controlled diabetic range) 12.6 (A) 0.0 - 7.0 %  ?Hemoglobin     Status: Abnormal  ? Collection Time: 08/22/21 10:20 AM  ?Result Value Ref Range  ? Hemoglobin 10.1 (A) 11 - 14.6 g/dL  ? ? ?Physical Exam ?Vitals reviewed.  ?Constitutional:   ?   General: She is not in acute distress. ?   Appearance: She is not ill-appearing, toxic-appearing or diaphoretic.  ?Cardiovascular:  ?   Rate and Rhythm: Normal rate and regular rhythm.  ?Pulmonary:  ?   Effort: Pulmonary effort is normal.  ?   Breath sounds: Normal breath sounds.  ?Abdominal:  ?   General: There is no  distension.  ?   Palpations: Abdomen is soft. There is no mass.  ?   Tenderness: There is abdominal tenderness. There is no guarding or rebound.  ?   Hernia: No hernia is present.  ?Neurological:  ?   Mental Status: She is alert and oriented to person, place, and time.  ?Psychiatric:     ?   Mood and Affect: Mood normal.     ?   Behavior: Behavior normal.  ? ?ASSESSMENT/PLAN:  ? ?Type 2 diabetes mellitus without complication, without long-term current use of insulin ([facility]) ?Uncontrolled.  A1c unchanged from last.  Not checking CBGs at home due to finger discomfort.  Discussed pricking finger on sides and rotating fingers for tolerability.  CBG log given today.  Encouraged to log CBGs and bring to follow-up with Dr. Raymondo Band.  Increase Lantus from 10 to 15 units.  Follow-up in 3 months for repeat A1c. ?- HgB A1c ?- insulin glargine (LANTUS) 100 UNIT/ML Solostar Pen; Inject 15 Units into the skin every morning.  Dispense: 3 mL; Refill: 1 ? ?2. Dysfunctional uterine bleeding ?POC hemoglobin 10.1. Three weeks of continual menstrual bleeding with patient stating only 1 day in between without menstrual bleeding.  Denies symptoms of anemia, infection.  At this time ectopic pregnancy less likely.  History of tubal  ligation so pregnancy less likely especially with the placement of Nexplanon in addition.  Nexplanon originally placed to decrease menstrual bleeding in which she was without until 3 weeks ago. Could consider ovarian torsion but less likely with bilateral lower abdominal discomfort and nonacute abdomen.  Could consider uterine fibroids but reviewed previous pelvic ultrasounds and these were not present at the time.  We will attempt to treat excessive menstrual bleeding with OCPs for short time. For definitive treatment could consider referral to OB/GYN for ablation versus hysterectomy if issue continues.  Return precautions given. ? - norgestimate-ethinyl estradiol (SPRINTEC 28) 0.25-35 MG-MCG tablet; Take 1 tablet by  mouth daily.  Dispense: 28 tablet; Refill: 11 ?- Hemoglobin ? ?Arkeem Harts Autry-Lott, DO ?[place]  ?

## 2021-08-22 NOTE — Patient Instructions (Signed)
It was wonderful to see you today. ? ?Please bring ALL of your medications with you to every visit.  ? ?Today we talked about: ? ?Continual menstrual bleeding-I have started you on oral contraceptive pills to minimize this.  Follow-up in 2 to 4 weeks unless needed sooner. ?Diabetes-your A1c remains elevated, I have increased your Lantus to 15 units continue at your Ozempic as is.  We discussed starting to take blood sugar readings at least 2-3 times a day.  Follow-up with Dr. Raymondo Band. ? ?Please be sure to schedule follow up at the front  desk before you leave today.  ? ?If you haven't already, sign up for My Chart to have easy access to your labs results, and communication with your primary care physician. ? ?Please call the clinic at [phone] if your symptoms worsen or you have any concerns. It was our pleasure to serve you. ? ?Dr. Salvadore Dom ? ?

## 2021-09-05 ENCOUNTER — Ambulatory Visit: Payer: Medicaid Other | Admitting: Pharmacist

## 2021-09-07 ENCOUNTER — Other Ambulatory Visit: Payer: Self-pay | Admitting: Family Medicine

## 2021-09-07 ENCOUNTER — Telehealth: Payer: Medicaid Other | Admitting: Nurse Practitioner

## 2021-09-07 DIAGNOSIS — E119 Type 2 diabetes mellitus without complications: Secondary | ICD-10-CM

## 2021-09-07 DIAGNOSIS — E1065 Type 1 diabetes mellitus with hyperglycemia: Secondary | ICD-10-CM | POA: Diagnosis not present

## 2021-09-07 NOTE — Progress Notes (Signed)
? ?Virtual Visit Consent  ? ?[name] [name], you are scheduled for a virtual visit with Mary-Margaret Hassell Done, FNP, a [facility] provider, today.   ?  ?Just as with appointments in the office, your consent must be obtained to participate.  Your consent will be active for this visit and any virtual visit you may have with one of our providers in the next 365 days.   ?  ?If you have a MyChart account, a copy of this consent can be sent to you electronically.  All virtual visits are billed to your insurance company just like a traditional visit in the office.   ? ?As this is a virtual visit, video technology does not allow for your provider to perform a traditional examination.  This may limit your provider's ability to fully assess your condition.  If your provider identifies any concerns that need to be evaluated in person or the need to arrange testing (such as labs, EKG, etc.), we will make arrangements to do so.   ?  ?Although advances in technology are sophisticated, we cannot ensure that it will always work on either your end or our end.  If the connection with a video visit is poor, the visit may have to be switched to a telephone visit.  With either a video or telephone visit, we are not always able to ensure that we have a secure connection.    ? ?I need to obtain your verbal consent now.   Are you willing to proceed with your visit today? YES ?  ?[name] [name] has provided verbal consent on 09/07/2021 for a virtual visit (video or telephone). ?  ?Mary-Margaret Hassell Done, FNP  ? ?Date: 09/07/2021 1:11 PM ? ? ?Virtual Visit via Video Note  ? ?I, Mary-Margaret Hassell Done, connected with [name] [name] ([id number], 1986/03/13) on 09/07/21 at  1:15 PM EDT by a video-enabled telemedicine application and verified that I am speaking with the correct person using two identifiers. ? ?Location: ?Patient: Virtual Visit Location Patient: Home ?Provider: Virtual Visit Location Provider: Mobile ?  ?I discussed the limitations  of evaluation and management by telemedicine and the availability of in person appointments. The patient expressed understanding and agreed to proceed.   ? ?History of Present Illness: ?[name] [name] is a 36 y.o. who identifies as a female who was assigned female at birth, and is being seen today for hyperglycemia. ? ?HPI: Patient does video visit stating  that her blood sugar is 418. She has been a diabetic since she was 36 yers old. She still has to prick her finger to check her blood sugar. Her current meds include; ozempic 0.25mg  weekly, Lantus 15mg  qhs and novalog sliding scale 3x a day with base of 3u and goes up based on her blood sugars. She did not check her blood sugar this morning nor did her sliding scale dosing.  ?  ?Review of Systems  ?Constitutional:  Negative for diaphoresis and weight loss.  ?Eyes:  Negative for blurred vision, double vision and pain.  ?Respiratory:  Negative for shortness of breath.   ?Cardiovascular:  Negative for chest pain, palpitations, orthopnea and leg swelling.  ?Gastrointestinal:  Negative for abdominal pain.  ?Skin:  Negative for rash.  ?Neurological:  Negative for dizziness, sensory change, loss of consciousness, weakness and headaches.  ?Endo/Heme/Allergies:  Negative for polydipsia. Does not bruise/bleed easily.  ?Psychiatric/Behavioral:  Negative for memory loss. The patient does not have insomnia.   ?All other systems reviewed and are negative. ? ?  Problems:  ?Patient Active Problem List  ? Diagnosis Date Noted  ? Sinusitis, maxillary, chronic 10/13/2020  ? Depression, recurrent ([place]) 03/21/2020  ? Hx of menorrhagia 03/15/2020  ? Type 2 diabetes mellitus with diabetic neuropathy, unspecified ([place]) 03/14/2020  ? Pneumonia due to COVID-19 virus 12/28/2019  ? Acute hypoxemic respiratory failure due to COVID-19 [facility]) 12/28/2019  ? UTI (urinary tract infection) 12/28/2019  ? Sepsis ([place]) 12/28/2019  ? DKA (diabetic ketoacidoses) 12/28/2019  ? Anemia 04/15/2019  ?  Abdominal pain 03/14/2019  ? Screening for malignant neoplasm of cervix 12/30/2018  ? Type 2 diabetes mellitus without complication, without long-term current use of insulin ([place]) 12/30/2018  ? Chest pain 05/20/2017  ? History of tubal ligation   ? Medication refill 07/22/2016  ? Vaginal discharge 05/16/2016  ? Constipation 03/04/2016  ? Yeast infection 02/11/2016  ? Vagina, candidiasis 03/24/2014  ? Alopecia areata 09/29/2013  ? Type II diabetes mellitus, uncontrolled 09/20/2007  ? 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 Obesity 07/09/2006  ? RHINITIS, ALLERGIC 07/09/2006  ? PAPANICOLAOU SMEAR, ABNORMAL 07/09/2006  ?  ?Allergies:  ?Allergies  ?Allergen Reactions  ? Metformin And Related Nausea And Vomiting  ?  Unable to tolerate $RemoveBef'500mg'lMAqXUCQhj$  XR daily  ? ?Medications:  ?Current Outpatient Medications:  ?  Accu-Chek Softclix Lancets lancets, Use as directed to check blood glucose once daily, Disp: 100 each, Rfl: 12 ?  albuterol (VENTOLIN HFA) 108 (90 Base) MCG/ACT inhaler, Inhale 2 puffs into the lungs every 4 (four) hours as needed for wheezing or shortness of breath. (Patient not taking: Reported on 01/07/2021), Disp: 6.7 g, Rfl: 0 ?  blood glucose meter kit and supplies KIT, Dispense based on patient and insurance preference. Use up to four times daily as directed., Disp: 1 each, Rfl: 0 ?  Blood Glucose Monitoring Suppl (ACCU-CHEK GUIDE) w/Device KIT, Use as directed to check blood glucose once daily, Disp: 1 kit, Rfl: 0 ?  glucose blood (ACCU-CHEK GUIDE) test strip, Use as directed to check blood glucose once daily, Disp: 100 each, Rfl: 12 ?  ibuprofen (ADVIL) 600 MG tablet, Take 1 tablet (600 mg total) by mouth every 6 (six) hours as needed., Disp: 30 tablet, Rfl: 0 ?  ibuprofen (ADVIL) 800 MG tablet, Take 1 tablet (800 mg total) by mouth every 8 (eight) hours as needed (pain)., Disp: 21 tablet, Rfl: 0 ?  insulin aspart (NOVOLOG) 100 UNIT/ML FlexPen, 0-20 Units, Subcutaneous, 3 times daily with meals CBG <  70: Implement Hypoglycemia measures CBG 70 - 120: 0 units CBG 121 - 150: 3 units CBG 151 - 200: 4 units CBG 201 - 250: 7 units CBG 251 - 300: 11 units CBG 301 - 350: 15 units CBG 351 - 400: 20 units CBG > 400: call MD (Patient not taking: Reported on 05/22/2021), Disp: 15 mL, Rfl: 0 ?  insulin glargine (LANTUS) 100 UNIT/ML Solostar Pen, Inject 15 Units into the skin every morning., Disp: 3 mL, Rfl: 1 ?  Insulin Pen Needle (PEN NEEDLES) 32G X 4 MM MISC, Use to inject insulin once daily, Disp: 100 each, Rfl: 12 ?  Lancets (FREESTYLE) lancets, 1 each by Other route See admin instructions for 30 doses. Check glucose 3 time per day and write result in logbook, Disp: , Rfl:  ?  methocarbamol (ROBAXIN) 500 MG tablet, Take 1 tablet (500 mg total) by mouth 2 (two) times daily., Disp: 20 tablet, Rfl: 0 ?  norgestimate-ethinyl estradiol (SPRINTEC 28) 0.25-35 MG-MCG tablet, Take 1 tablet by  mouth daily., Disp: 28 tablet, Rfl: 11 ?  nystatin cream (MYCOSTATIN), Apply to affected area 2 times daily (Patient not taking: Reported on 05/22/2021), Disp: 30 g, Rfl: 0 ?  Semaglutide,0.25 or 0.5MG/DOS, (OZEMPIC, 0.25 OR 0.5 MG/DOSE,) 2 MG/1.5ML SOPN, Inject 0.25 mg into the skin once a week. After 4 doses increase to 0.45m once weekly, Disp: 1.5 mL, Rfl: 0 ? ?Current Facility-Administered Medications:  ?  etonogestrel (NEXPLANON) implant 68 mg, 68 mg, Subdermal, Once, JPrecious Gilding DO ? ?Observations/Objective: ?Patient is well-developed, well-nourished in no acute distress.  ?Resting comfortably  at home.  ?Head is normocephalic, atraumatic.  ?No labored breathing.  ?Speech is clear and coherent with logical content.  ?Patient is alert and oriented at baseline.  ? ? ?Assessment and Plan: ? ?[name] [name] in today with chief complaint of Hyperglycemia ? ? ?1. Type 1 diabetes mellitus with hyperglycemia (HCC) ?Novalog 10u now ?Eat spoon full of peanut butter ?Drink 16oz of water ?Recheck blood sugar in 20-30 minutes ?If not coming down  let [place] ?Continue lantus at 15u qhs ?Ozempic 0.238mweekly ?Try to avoid skipping sliding scale novalog ?Talk with PCP about Libre or dexcom CGM ? ? ? ? ?Follow Up Instructions: ?I discussed the assessment an

## 2021-09-07 NOTE — Patient Instructions (Signed)
Hyperglycemia Hyperglycemia is when the sugar (glucose) level in your blood is too high. High blood sugar can happen to people who have or do not have diabetes. High blood sugar can happen quickly. It can be an emergency. What are the causes? If you have diabetes, high blood sugar may be caused by: Medicines that increase blood sugar or affect your control of diabetes. Getting less physical activity. Overeating. Being sick or injured or having an infection. Having surgery. Stress. Not giving yourself enough insulin (if you are taking it). You may have high blood sugar because you have diabetes that has not been diagnosed yet. If you do not have diabetes, high blood sugar may be caused by: Certain medicines. Stress. A bad illness. An infection. Having surgery. Diseases of the pancreas. What increases the risk? This condition is more likely to develop in people who have risk factors for diabetes, such as: Having a family member with diabetes. Certain conditions in which the body's defense system (immune system) attacks itself. These are called autoimmune disorders. Being overweight. Not being active. Having a condition called insulin resistance. Having a history of: Prediabetes. Diabetes when pregnant. Polycystic ovarian syndrome (PCOS). What are the signs or symptoms? This condition may not cause symptoms. If you do have symptoms, they may include: Feeling more thirsty than normal. Needing to pee (urinate) more often than normal. Hunger. Feeling very tired. Blurry eyesight (vision). You may get other symptoms as the condition gets worse, such as: Dry mouth. Pain in your belly (abdomen). Not being hungry (loss of appetite). Breath that smells fruity. Weakness. Weight loss that is not planned. A tingling or numb feeling in your hands or feet. A headache. Cuts or bruises that heal slowly. How is this treated? Treatment depends on the cause of your condition. Treatment may  include: Taking medicine to control your blood sugar levels. Changing your medicine or dosage if you take insulin or other diabetes medicines. Lifestyle changes. These may include: Exercising more. Eating healthier foods. Losing weight. Treating an illness or infection. Checking your blood sugar more often. Stopping or reducing steroid medicines. If your condition gets very bad, you will need to be treated in the hospital. Follow these instructions at home: General instructions Take over-the-counter and prescription medicines only as told by your doctor. Do not smoke or use any products that contain nicotine or tobacco. If you need help quitting, ask your doctor. If you drink alcohol: Limit how much you have to: 0-1 drink a day for women who are not pregnant. 0-2 drinks a day for men. Know how much alcohol is in a drink. In the [place] one drink equals one 12 oz bottle of beer (355 mL), one 5 oz glass of wine (148 mL), or one 1 oz glass of hard liquor (44 mL). Manage stress. If you need help with this, ask your doctor. Do exercises as told by your doctor. Keep all follow-up visits. Eating and drinking  Stay at a healthy weight. Make sure you drink enough fluid when you: Exercise. Get sick. Are in hot temperatures. Drink enough fluid to keep your pee (urine) pale yellow. If you have diabetes:  Know the symptoms of high blood sugar. Follow your diabetes management plan as told by your doctor. Make sure you: Take insulin and medicines as told. Follow your exercise plan. Follow your meal plan. Eat on time. Do not skip meals. Check your blood sugar as often as told. Make sure you check before and after exercise. If you   exercise longer or in a different way, check your blood sugar more often. Follow your sick day plan whenever you cannot eat or drink normally. Make this plan ahead of time with your doctor. Share your diabetes management plan with people in your workplace, school,  and household. Check your pee for ketones when you are ill and as told by your doctor. Carry a card or wear jewelry that says that you have diabetes. Where to find more information American Diabetes Association: www.diabetes.org Contact a doctor if: Your blood sugar level is at or above 240 mg/dL (13.3 mmol/L) for 2 days in a row. You have problems keeping your blood sugar in your target range. You have high blood pressure often. You have signs of illness, such as: Feeling like you may vomit (feeling nauseous). Vomiting. A fever. Get help right away if: Your blood sugar monitor reads "high" even when you are taking insulin. You have trouble breathing. You have a change in how you think, feel, or act (mental status). You feel like you may vomit, and the feeling does not go away. You cannot stop vomiting. These symptoms may be an emergency. Get medical help right away. Call your local emergency services (911 in the [place] Do not wait to see if the symptoms will go away. Do not drive yourself to the hospital. Summary Hyperglycemia is when the sugar (glucose) level in your blood is too high. High blood sugar can happen to people who have or do not have diabetes. Make sure you drink enough fluids and follow your meal plan. Exercise as often as told by your doctor. Contact your doctor if you have problems keeping your blood sugar in your target range. This information is not intended to replace advice given to you by your health care provider. Make sure you discuss any questions you have with your health care provider. Document Revised: 02/10/2020 Document Reviewed: 02/10/2020 Elsevier Patient Education  2023 Elsevier Inc.  

## 2021-10-04 ENCOUNTER — Other Ambulatory Visit: Payer: Self-pay | Admitting: Student

## 2021-10-04 ENCOUNTER — Telehealth: Payer: Self-pay | Admitting: Student

## 2021-10-04 DIAGNOSIS — E119 Type 2 diabetes mellitus without complications: Secondary | ICD-10-CM

## 2021-10-04 MED ORDER — INSULIN ASPART 100 UNIT/ML FLEXPEN: PEN_INJECTOR | SUBCUTANEOUS | 0 refills | Status: DC

## 2021-10-04 MED ORDER — OZEMPIC (0.25 OR 0.5 MG/DOSE) 2 MG/1.5ML ~~LOC~~ SOPN: PEN_INJECTOR | SUBCUTANEOUS | 0 refills | Status: DC

## 2021-10-04 MED ORDER — INSULIN GLARGINE 100 UNIT/ML SOLOSTAR PEN: 15.0000 [IU] | PEN_INJECTOR | SUBCUTANEOUS | 1 refills | Status: DC

## 2021-10-04 NOTE — Telephone Encounter (Signed)
Patient came by out of meds for diabetes novolog,Lantus and ozempic.  Please call to see if she can get refill [facility] on [address]  [phone] Has appt with PCP on 10/24/2021

## 2021-10-04 NOTE — Progress Notes (Signed)
Diabetes medications refilled. Attempted to call pt to let her know, she did not answer and was unable to leave voice mail.

## 2021-10-15 ENCOUNTER — Encounter: Payer: Self-pay | Admitting: *Deleted

## 2021-10-22 NOTE — Progress Notes (Deleted)
    SUBJECTIVE:   CHIEF COMPLAINT / HPI:   T2DM Last A1c 08/22/21 of 12.6. Medications include Lantus 10 units daily, ozempic 0.5 mg weekly   PERTINENT  PMH / PSH: T2DM,   OBJECTIVE:   There were no vitals taken for this visit. ***  General: NAD, pleasant, able to participate in exam Cardiac: RRR, no murmurs. Respiratory: CTAB, normal effort, No wheezes, rales or rhonchi Abdomen: Bowel sounds present, nontender, nondistended, no hepatosplenomegaly. Extremities: no edema or cyanosis. Skin: warm and dry, no rashes noted Neuro: alert, no obvious focal deficits Psych: Normal affect and mood  ASSESSMENT/PLAN:   No problem-specific Assessment & Plan notes found for this encounter.     Dr. Erick Alley, DO [facility]    {    This will disappear when note is signed, click to select method of visit    :1}

## 2021-10-24 ENCOUNTER — Ambulatory Visit: Payer: Medicaid Other | Admitting: Student

## 2021-11-01 ENCOUNTER — Other Ambulatory Visit: Payer: Self-pay | Admitting: Student

## 2021-11-01 DIAGNOSIS — E119 Type 2 diabetes mellitus without complications: Secondary | ICD-10-CM

## 2021-11-04 MED ORDER — OZEMPIC (0.25 OR 0.5 MG/DOSE) 2 MG/1.5ML ~~LOC~~ SOPN: PEN_INJECTOR | SUBCUTANEOUS | 0 refills | Status: DC

## 2022-02-06 ENCOUNTER — Ambulatory Visit (INDEPENDENT_AMBULATORY_CARE_PROVIDER_SITE_OTHER): Payer: Medicaid Other | Admitting: Family Medicine

## 2022-02-06 ENCOUNTER — Encounter: Payer: Self-pay | Admitting: Family Medicine

## 2022-02-06 ENCOUNTER — Other Ambulatory Visit: Payer: Self-pay

## 2022-02-06 VITALS — BP 140/81 | HR 100 | Wt 209.8 lb

## 2022-02-06 DIAGNOSIS — E119 Type 2 diabetes mellitus without complications: Secondary | ICD-10-CM | POA: Diagnosis not present

## 2022-02-06 DIAGNOSIS — E1159 Type 2 diabetes mellitus with other circulatory complications: Secondary | ICD-10-CM

## 2022-02-06 DIAGNOSIS — I152 Hypertension secondary to endocrine disorders: Secondary | ICD-10-CM

## 2022-02-06 LAB — POCT GLYCOSYLATED HEMOGLOBIN (HGB A1C): HbA1c, POC (controlled diabetic range): 12.4 % — AB (ref 0.0–7.0)

## 2022-02-06 MED ORDER — OZEMPIC (0.25 OR 0.5 MG/DOSE) 2 MG/1.5ML ~~LOC~~ SOPN: PEN_INJECTOR | SUBCUTANEOUS | 0 refills | Status: DC

## 2022-02-06 MED ORDER — INSULIN GLARGINE 100 UNITS/ML SOLOSTAR PEN: 20.0000 [IU] | PEN_INJECTOR | Freq: Every day | SUBCUTANEOUS | 11 refills | Status: DC

## 2022-02-06 MED ORDER — LOSARTAN POTASSIUM 25 MG PO TABS: 25.0000 mg | ORAL_TABLET | Freq: Every day | ORAL | 3 refills | Status: DC

## 2022-02-06 NOTE — Patient Instructions (Addendum)
It was great seeing you today!  You came in for diabetes follow up and we checked your A1c which was 12.6 so it will be important to get this number lowered   We have increased your Lantus to 20 units each morning.  Take your sugars first thing in the morning so that we know how to adjust your Lantus moving forward.  You can send me a MyChart message once a week with which ever mornings you are able to check.  Also review this with you when you follow-up with me in 2 weeks at the clinic  I have refilled your Ozempic .5mg  weekly   For your blood pressure we started you on losartan 25 mg at night.  Check your blood work when you come in in 2 weeks  Please check-out at the front desk before leaving the clinic. I'd like to see you back in 2 weeks for follow up,  but if you need to be seen earlier than that for any new issues we're happy to fit you in, just give [place] a call!  Visit Reminders: - Stop by the pharmacy to pick up your prescriptions  - Continue to work on your healthy eating habits and incorporating exercise into your daily life.    Feel free to call with any questions or concerns at any time, at [phone].   Take care,  Dr. Shary Key [facility]

## 2022-02-06 NOTE — Progress Notes (Signed)
    SUBJECTIVE:   CHIEF COMPLAINT / HPI:   Presents for Diabetes follow up, wants to wait for physical and pap smear due to being on her cycle.   Last A1c in April was 10.1.  She states she has been trying to eat well States she was on sliding scale but hasnt been doing it Lantus 15 units every morning   Does endorse frequent urination and states she drinks a lot of water. Denies syncopal episodes   Tired has 2 kids and sometimes forgets to take her medication  Has not been consistent with checking sugars and writing it down   States she ran out of Ozempic last week   PERTINENT  PMH / PSH: Reviewed   OBJECTIVE:   BP (!) 140/81   Pulse 100   Wt 209 lb 12.8 oz (95.2 kg)   SpO2 100%   BMI 39.64 kg/m    Physical exam General: well appearing, NAD Cardiovascular: mildly tachycardic, no murmurs Lungs: CTAB. Normal WOB Abdomen: soft, non-distended, non-tender Skin: warm, dry. No edema  ASSESSMENT/PLAN:   Type II diabetes mellitus, uncontrolled A1c currently 12.4, similar to 5 months ago.  Currently on Lantus 15 units in the morning.  Endorses having difficulty checking her blood sugars due to her schedule, and has not been doing the sliding scale.  She is on Ozempic 0.5 mg weekly, and ran out a week ago.  Discussed letting us know before that happens again so we can make sure she does not run out of medication.  We will refill Ozempic with the goal of increasing dose to 1mg  next month.  We will increase Lantus to 20 units daily and discontinue sliding scale.  Asked her to check 1 fasting glucose in the mornings, she states that will be hard but she will try.  I asked her to check as many mornings as she can in a week and write that down and send me a MyChart message once a week with those numbers so we can titrate her insulin accordingly. Wasn't able to leave urine to check microalbumin/Cr but will check at follow up. Advised to follow-up in 2 weeks in the clinic.  Hypertension  associated with diabetes ([place]) BP 158/81 and on repeat 140/81.  She has had some high blood pressures in the past as well.  Not on medication.  Discussed starting her on a medication which she was agreeable to.  We will start losartan 25 mg daily, but discussed that we would have to have her come back in 2 weeks to check her blood work and she was agreeable with that plan.   [place]

## 2022-02-06 NOTE — Progress Notes (Deleted)
    SUBJECTIVE:   CHIEF COMPLAINT / HPI:   ***  PERTINENT  PMH / PSH: ***  OBJECTIVE:   There were no vitals taken for this visit.  Physical exam General: well appearing, NAD Cardiovascular: RRR, no murmurs Lungs: CTAB. Normal WOB Abdomen: soft, non-distended, non-tender Skin: warm, dry. No edema  ASSESSMENT/PLAN:   No problem-specific Assessment & Plan notes found for this encounter.     [place]

## 2022-02-07 NOTE — Assessment & Plan Note (Signed)
BP 158/81 and on repeat 140/81.  She has had some high blood pressures in the past as well.  Not on medication.  Discussed starting her on a medication which she was agreeable to.  We will start losartan 25 mg daily, but discussed that we would have to have her come back in 2 weeks to check her blood work and she was agreeable with that plan.

## 2022-02-07 NOTE — Assessment & Plan Note (Signed)
A1c currently 12.4, similar to 5 months ago.  Currently on Lantus 15 units in the morning.  Endorses having difficulty checking her blood sugars due to her schedule, and has not been doing the sliding scale.  She is on Ozempic 0.5 mg weekly, and ran out a week ago.  Discussed letting us know before that happens again so we can make sure she does not run out of medication.  We will refill Ozempic with the goal of increasing dose to 1mg  next month.  We will increase Lantus to 20 units daily and discontinue sliding scale.  Asked her to check 1 fasting glucose in the mornings, she states that will be hard but she will try.  I asked her to check as many mornings as she can in a week and write that down and send me a MyChart message once a week with those numbers so we can titrate her insulin accordingly. Wasn't able to leave urine to check microalbumin/Cr but will check at follow up. Advised to follow-up in 2 weeks in the clinic.

## 2022-02-18 ENCOUNTER — Ambulatory Visit: Payer: Medicaid Other | Admitting: Family Medicine

## 2022-04-21 ENCOUNTER — Encounter (HOSPITAL_COMMUNITY): Payer: Self-pay | Admitting: Emergency Medicine

## 2022-04-21 ENCOUNTER — Other Ambulatory Visit: Payer: Self-pay

## 2022-04-21 ENCOUNTER — Ambulatory Visit (HOSPITAL_COMMUNITY)
Admission: EM | Admit: 2022-04-21 | Discharge: 2022-04-21 | Disposition: A | Discharge status: Home / Self Care | Payer: Medicaid Other | Attending: Emergency Medicine | Admitting: Emergency Medicine

## 2022-04-21 DIAGNOSIS — M436 Torticollis: Secondary | ICD-10-CM

## 2022-04-21 MED ORDER — KETOROLAC TROMETHAMINE 30 MG/ML IJ SOLN
INTRAMUSCULAR | Status: AC
  Filled 2022-04-21: qty 1

## 2022-04-21 MED ORDER — NAPROXEN 375 MG PO TABS: 375.0000 mg | ORAL_TABLET | Freq: Two times a day (BID) | ORAL | 0 refills | Status: DC

## 2022-04-21 MED ORDER — IPRATROPIUM-ALBUTEROL 0.5-2.5 (3) MG/3ML IN SOLN
RESPIRATORY_TRACT | Status: AC
  Filled 2022-04-21: qty 3

## 2022-04-21 MED ORDER — CYCLOBENZAPRINE HCL 5 MG PO TABS: 5.0000 mg | ORAL_TABLET | Freq: Three times a day (TID) | ORAL | 0 refills | Status: DC | PRN

## 2022-04-21 MED ORDER — KETOROLAC TROMETHAMINE 30 MG/ML IJ SOLN
30.0000 mg | Freq: Once | INTRAMUSCULAR | Status: AC
  Administered 2022-04-21: 30 mg via INTRAMUSCULAR

## 2022-04-21 NOTE — Discharge Instructions (Addendum)
Use the cyclobenzaprine up to 3 times a day if needed but if you have to drive or work, only use it at night.   Don't start taking naproxen until tomorrow morning.   Use ice to your neck for the first 48 hours of injury/pain. Then you can switch to heat.   If you feel you are not getting better, call one of the sports medicine clinics below for an appointment

## 2022-04-21 NOTE — ED Provider Notes (Signed)
[place]    CSN: [id number] Arrival date & time: 04/21/22  1122      History   Chief Complaint Chief Complaint  Patient presents with   Neck Pain    HPI Adelin D Dapolito is a 36 y.o. female. Last night she fell asleep around 7pm. Woke up at 10pm with L neck and arm pain and numbness. Numbness is intermittent and worse with some positions. Also feels like L hand and fingers are alternating cold and warm, different than her R hand. Denies injury or fall.      Neck Pain   Past Medical History:  Diagnosis Date   Abnormal Pap smear    Diabetes mellitus    type II   GBS (group B streptococcus) UTI complicating pregnancy    Herpes simplex without mention of complication    Hypertension    Irregular menses    Obesity     Patient Active Problem List   Diagnosis Date Noted   Sinusitis, maxillary, chronic 10/13/2020   Depression, recurrent ([place]) 03/21/2020   Hx of menorrhagia 03/15/2020   Type 2 diabetes mellitus with diabetic neuropathy, unspecified ([place]) 03/14/2020   Pneumonia due to COVID-19 virus 12/28/2019   Acute hypoxemic respiratory failure due to COVID-19 ([place]) 12/28/2019   UTI (urinary tract infection) 12/28/2019   Sepsis ([place]) 12/28/2019   DKA (diabetic ketoacidoses) 12/28/2019   Anemia 04/15/2019   Abdominal pain 03/14/2019   Screening for malignant neoplasm of cervix 12/30/2018   Type 2 diabetes mellitus without complication, without long-term current use of insulin ([place]) 12/30/2018   Chest pain 05/20/2017   History of tubal ligation    Medication refill 07/22/2016   Vaginal discharge 05/16/2016   Constipation 03/04/2016   Yeast infection 02/11/2016   Vagina, candidiasis 03/24/2014   Alopecia areata 09/29/2013   Hypertension associated with diabetes ([place]) 08/09/2009   Type II diabetes mellitus, uncontrolled 09/20/2007   Herpes simplex virus (HSV) infection 10/23/2006   Obesity 07/09/2006   RHINITIS, ALLERGIC 07/09/2006   PAPANICOLAOU  SMEAR, ABNORMAL 07/09/2006    Past Surgical History:  Procedure Laterality Date   CESAREAN SECTION  01/30/2011   Procedure: CESAREAN SECTION;  Surgeon: [name], MD;  Location: [facility];  Service: Gynecology;  Laterality: N/A;   CESAREAN SECTION N/A 10/22/2014   Procedure: CESAREAN SECTION;  Surgeon: [name], MD;  Location: [facility];  Service: Obstetrics;  Laterality: N/A;   TUBAL LIGATION     WISDOM TOOTH EXTRACTION      OB History     Gravida  4   Para  2   Term  1   Preterm  1   AB  2   Living  2      SAB  2   IAB      Ectopic      Multiple  0   Live Births  2            Home Medications    Prior to Admission medications   Medication Sig Start Date End Date Taking? Authorizing Provider  cyclobenzaprine (FLEXERIL) 5 MG tablet Take 1 tablet (5 mg total) by mouth 3 (three) times daily as needed for muscle spasms. 04/21/22  Yes Carvel Getting, NP  naproxen (NAPROSYN) 375 MG tablet Take 1 tablet (375 mg total) by mouth 2 (two) times daily. 04/21/22  Yes Carvel Getting, NP  Accu-Chek Softclix Lancets lancets Use as directed to check blood glucose once daily 05/22/21   Pray,  Norwood Levo, MD  blood glucose meter kit and supplies KIT Dispense based on patient and insurance preference. Use up to four times daily as directed. 05/03/21   Precious Gilding, DO  Blood Glucose Monitoring Suppl (ACCU-CHEK GUIDE) w/Device KIT Use as directed to check blood glucose once daily 05/22/21   Lenoria Chime, MD  glucose blood (ACCU-CHEK GUIDE) test strip Use as directed to check blood glucose once daily 05/22/21   Lenoria Chime, MD  ibuprofen (ADVIL) 600 MG tablet Take 1 tablet (600 mg total) by mouth every 6 (six) hours as needed. 08/17/21   Lacretia Leigh, MD  ibuprofen (ADVIL) 800 MG tablet Take 1 tablet (800 mg total) by mouth every 8 (eight) hours as needed (pain). 06/17/21   Barrett Henle, MD  insulin aspart (NOVOLOG) 100 UNIT/ML FlexPen 0-20 Units, Subcutaneous, 3  times daily with meals CBG < 70: Implement Hypoglycemia measures CBG 70 - 120: 0 units CBG 121 - 150: 3 units CBG 151 - 200: 4 units CBG 201 - 250: 7 units CBG 251 - 300: 11 units CBG 301 - 350: 15 units CBG 351 - 400: 20 units CBG > 400: call MD 10/04/21   Precious Gilding, DO  insulin glargine (LANTUS) 100 unit/mL SOPN Inject 20 Units into the skin daily. 02/06/22   Shary Key, DO  Insulin Pen Needle (PEN NEEDLES) 32G X 4 MM MISC Use to inject insulin once daily 05/22/21   Lenoria Chime, MD  Lancets (FREESTYLE) lancets 1 each by Other route See admin instructions for 30 doses. Check glucose 3 time per day and write result in logbook 01/01/20   Jonetta Osgood, MD  losartan (COZAAR) 25 MG tablet Take 1 tablet (25 mg total) by mouth at bedtime. 02/06/22   Shary Key, DO  Semaglutide,0.25 or 0.5MG/DOS, (OZEMPIC, 0.25 OR 0.5 MG/DOSE,) 2 MG/1.5ML SOPN INJECT 0.25MG INTO THE SKIN ONCE WEEKLY. AFTER 4 DOSES, INCREASE TO 0.5MG ONCE WEEKLY 02/06/22   Shary Key, DO    Family History Family History  Problem Relation Age of Onset   Diabetes Paternal Grandmother    Hypertension Paternal Grandmother    Diabetes Maternal Grandmother    Diabetes Father    Cancer Paternal Uncle    Breast cancer Maternal Aunt    Breast cancer Paternal Aunt     Social History Social History   Tobacco Use   Smoking status: Former   Smokeless tobacco: Never  Scientific laboratory technician Use: Never used  Substance Use Topics   Alcohol use: Yes    Comment: "occassionally"   Drug use: No     Allergies   Metformin and related   Review of Systems Review of Systems  Musculoskeletal:  Positive for neck pain.     Physical Exam Triage Vital Signs ED Triage Vitals  Enc Vitals Group     BP 04/21/22 1532 131/87     Pulse Rate 04/21/22 1532 86     Resp 04/21/22 1532 20     Temp 04/21/22 1532 98.7 F (37.1 C)     Temp Source 04/21/22 1532 Oral     SpO2 04/21/22 1532 98 %     Weight --       Height --      Head Circumference --      Peak Flow --      Pain Score 04/21/22 1529 8     Pain Loc --      Pain Edu? --  Excl. in GC? --    No data found.  Updated Vital Signs BP 131/87 (BP Location: Right Arm) Comment (BP Location): large cuff  Pulse 86   Temp 98.7 F (37.1 C) (Oral)   Resp 20   SpO2 98%   Visual Acuity Right Eye Distance:   Left Eye Distance:   Bilateral Distance:    Right Eye Near:   Left Eye Near:    Bilateral Near:     Physical Exam Constitutional:      Appearance: Normal appearance. She is not ill-appearing.  Cardiovascular:     Pulses:          Radial pulses are 2+ on the right side and 2+ on the left side.  Pulmonary:     Effort: Pulmonary effort is normal.  Musculoskeletal:     Cervical back: Torticollis and tenderness present. No bony tenderness. Decreased range of motion.     Comments: Pain with palpation to L neck/trapezius area. Decreased neck rotation to the right side.   Skin:    General: Skin is warm and dry.     Capillary Refill: Capillary refill takes less than 2 seconds.  Neurological:     Mental Status: She is alert.     Sensory: Sensation is intact.     Comments: L hand sensation intact and pt can differentiate soft and sharp touch      UC Treatments / Results  Labs (all labs ordered are listed, but only abnormal results are displayed) Labs Reviewed - No data to display  EKG   Radiology No results found.  Procedures Procedures (including critical care time)  Medications Ordered in UC Medications  ketorolac (TORADOL) 30 MG/ML injection 30 mg (30 mg Intramuscular Given 04/21/22 1554)    Initial Impression / Assessment and Plan / UC Course  I have reviewed the triage vital signs and the nursing notes.  Pertinent labs & imaging results that were available during my care of the patient were reviewed by me and considered in my medical decision making (see chart for details).    Likely having numbness due to  neck muscle spasm. Given toradol here in UC and rx naproxen and flexeril. Pt works in [employer] and frequently does heavy lifting. Pt can return to work 12/13 but needs to refrain from heavy lifting. Given sports medicine contact info if not improving.   Final Clinical Impressions(s) / UC Diagnoses   Final diagnoses:  Torticollis, acute     Discharge Instructions      Use the cyclobenzaprine up to 3 times a day if needed but if you have to drive or work, only use it at night.   Don't start taking naproxen until tomorrow morning.   Use ice to your neck for the first 48 hours of injury/pain. Then you can switch to heat.   If you feel you are not getting better, call one of the sports medicine clinics below for an appointment   ED Prescriptions     Medication Sig Dispense Auth. Provider   naproxen (NAPROSYN) 375 MG tablet Take 1 tablet (375 mg total) by mouth 2 (two) times daily. 20 tablet Carvel Getting, NP   cyclobenzaprine (FLEXERIL) 5 MG tablet Take 1 tablet (5 mg total) by mouth 3 (three) times daily as needed for muscle spasms. 21 tablet Carvel Getting, NP      PDMP not reviewed this encounter.   Carvel Getting, NP 04/21/22 1652

## 2022-04-21 NOTE — ED Triage Notes (Signed)
Yesterday started having pain.  Patient woke from nap with sharp pain in left side of neck/back of neck.  Left arm is intermittently numb/tingling.  Patient is right handed. Tingling involves all fingers.  Patient perceives fingers are cold and then warm.

## 2022-05-07 ENCOUNTER — Other Ambulatory Visit: Payer: Self-pay

## 2022-05-07 DIAGNOSIS — E119 Type 2 diabetes mellitus without complications: Secondary | ICD-10-CM

## 2022-05-07 MED ORDER — OZEMPIC (0.25 OR 0.5 MG/DOSE) 2 MG/1.5ML ~~LOC~~ SOPN: PEN_INJECTOR | SUBCUTANEOUS | 0 refills | Status: DC

## 2022-05-07 MED ORDER — INSULIN GLARGINE 100 UNITS/ML SOLOSTAR PEN: 20.0000 [IU] | PEN_INJECTOR | Freq: Every day | SUBCUTANEOUS | 11 refills | Status: DC

## 2022-05-20 ENCOUNTER — Telehealth: Payer: Self-pay

## 2022-05-20 ENCOUNTER — Other Ambulatory Visit (HOSPITAL_COMMUNITY): Payer: Self-pay

## 2022-05-20 NOTE — Telephone Encounter (Signed)
Pharmacy Patient Advocate Encounter Received PA request for Ozempic (0.25 or 0.5 MG/DOSE) 2MG /1.5ML pen-injectors. Ran test claim found out NO PA is needed at this time.  Spoke with Pharmacy to process.  [name] [name] Rx Patient Advocate

## 2022-05-20 NOTE — Telephone Encounter (Signed)
error 

## 2022-07-10 ENCOUNTER — Telehealth: Payer: Self-pay

## 2022-07-10 NOTE — Progress Notes (Signed)
   Care Guide Note  07/10/2022 Name: [name] [name] MRN: [id number] DOB: 11-11-1985  Referred by: Precious Gilding, DO Reason for referral : Care Coordination (Outreach to schedule with Pharm )   [name] [name] is a 37 y.o. year old female who is a primary care patient of Precious Gilding, DO. [name] [name] was referred to the pharmacist for assistance related to DM.    Successful contact was made with the patient to discuss pharmacy services including being ready for the pharmacist to call at least 5 minutes before the scheduled appointment time, to have medication bottles and any blood sugar or blood pressure readings ready for review. The patient agreed to meet with the pharmacist via with the pharmacist via telephone visit on (date/time).  07/16/2022  [name] [name], [place], [place] [address] Direct Dial: [phone] [name]

## 2022-07-16 ENCOUNTER — Other Ambulatory Visit: Payer: Medicaid Other

## 2022-07-16 DIAGNOSIS — E119 Type 2 diabetes mellitus without complications: Secondary | ICD-10-CM

## 2022-07-16 MED ORDER — ACCU-CHEK GUIDE W/DEVICE KIT: PACK | 0 refills | Status: DC

## 2022-07-16 MED ORDER — ACCU-CHEK GUIDE VI STRP: ORAL_STRIP | 12 refills | Status: AC

## 2022-07-16 MED ORDER — ACCU-CHEK SOFTCLIX LANCETS MISC: 12 refills | Status: AC

## 2022-07-16 NOTE — Progress Notes (Signed)
07/16/2022 Name: COLLETTE CHENEY MRN: [id number] DOB: 1985-06-14  Chief Complaint  Patient presents with   Medication Management    T2DM, hypertension    Harbor D Sprouse is a 37 y.o. year old female who presented for a telephone visit.   They were referred to the pharmacist by their Case Management Team  for assistance in managing diabetes and hypertension.   Patient is participating in a Managed Medicaid Plan:  Yes  Subjective:  Care Team: Primary Care Provider: Precious Gilding, DO ; Next Scheduled Visit: not scheduled- requested patient call [facility] or via MyChart to schedule a follow up appointment  Medication Access/Adherence  Current Pharmacy:  [facility] [address] [place] [address] Phone: [phone] Fax: [facility] [place], [place] [address] Phone: [phone] Fax: [phone]  [facility] [place] [id number] [place]) [place] [address] Phone: [phone] Fax: [phone]   Patient reports affordability concerns with their medications: No  Patient reports access/transportation concerns to their pharmacy: No  Patient reports adherence concerns with their medications:  Yes- patient was without Medicaid for a few months and has been off [place] since the beginning of February. I also have concern for her adherence to her insulin regimen. She is no longer taking meal time insulin and insulin glargine was last dispensed in June 2023.     Diabetes:  Current medications: Lantus 9 units daily (prescribed as 20 units daily, last dispensed in June 2023, questionable adherence), Ozempic 0.5 mg weekly (has not filled in ~1 month due to insurance lapse) Medications tried in the past: Novolog per sliding scale, glimepiride, glipizide, glyburide, insulin NPH, metformin (intolerant), Januvia  Current  glucose readings  -Fasting: 80 -After lunch: 110 -Bedtime: 115 Using Accu-chek meter; testing 3 times daily  Patient denies hypoglycemic s/sx including dizziness, shakiness, sweating. Patient denies hyperglycemic symptoms including polyuria, polydipsia, polyphagia, nocturia, neuropathy, blurred vision.  Current meal patterns:  - Breakfast: raisin bagel - Lunch salad - Supper grilled chicken sandwich - Drinks: water  Current physical activity: walks at work   Hypertension:  Current medications: losartan 25 mg daily (last filled in October 2023)  Patient does not have a validated, automated, upper arm home BP cuff  Patient denies hypotensive s/sx including dizziness, lightheadedness.  Patient denies hypertensive symptoms including headache, chest pain, shortness of breath  Current meal patterns: reports she limits salt intake   Objective:  Lab Results  Component Value Date   HGBA1C 12.4 (A) 02/06/2022    Lab Results  Component Value Date   CREATININE 0.64 09/16/2020   BUN <5 (L) 09/16/2020   NA 136 09/16/2020   K 3.6 09/16/2020   CL 106 09/16/2020   CO2 22 09/16/2020    Lab Results  Component Value Date   CHOL 142 05/20/2017   HDL 42 05/20/2017   LDLCALC 86 05/20/2017   LDLDIRECT 98 07/01/2012   TRIG 71 05/20/2017   CHOLHDL 3.4 05/20/2017    Medications Reviewed Today     Reviewed by Wylene Men, RN (Registered Nurse) on 04/21/22 at 1531  Med List Status: <None>   Medication Order Taking? Sig Documenting Provider Last Dose Status Informant  Accu-Chek Softclix Lancets lancets [id number]  Use as directed to check blood glucose once daily Lenoria Chime, MD  Active   albuterol (VENTOLIN HFA) 108 (90 Base) MCG/ACT inhaler [id number]  Inhale 2  puffs into the lungs every 4 (four) hours as needed for wheezing or shortness of breath.  Patient not taking: Reported on 01/07/2021   Jonetta Osgood, MD  Active   blood glucose meter kit and supplies  KIT [id number]  Dispense based on patient and insurance preference. Use up to four times daily as directed. Precious Gilding, DO  Active   Blood Glucose Monitoring Suppl (ACCU-CHEK GUIDE) w/Device KIT [id number]  Use as directed to check blood glucose once daily Lenoria Chime, MD  Active   etonogestrel [facility]) implant 68 mg [id number]   Precious Gilding, DO  Active   glucose blood (ACCU-CHEK GUIDE) test strip [id number]  Use as directed to check blood glucose once daily Lenoria Chime, MD  Active   ibuprofen (ADVIL) 600 MG tablet [id number]  Take 1 tablet (600 mg total) by mouth every 6 (six) hours as needed. Lacretia Leigh, MD  Active   ibuprofen (ADVIL) 800 MG tablet [id number]  Take 1 tablet (800 mg total) by mouth every 8 (eight) hours as needed (pain). Barrett Henle, MD  Active   insulin aspart (NOVOLOG) 100 UNIT/ML FlexPen [id number]  0-20 Units, Subcutaneous, 3 times daily with meals CBG < 70: Implement Hypoglycemia measures CBG 70 - 120: 0 units CBG 121 - 150: 3 units CBG 151 - 200: 4 units CBG 201 - 250: 7 units CBG 251 - 300: 11 units CBG 301 - 350: 15 units CBG 351 - 400: 20 units CBG > 400: call MD Precious Gilding, DO  Active   insulin glargine (LANTUS) 100 unit/mL SOPN [id number]  Inject 20 Units into the skin daily. Shary Key, DO  Active   Insulin Pen Needle (PEN NEEDLES) 32G X 4 MM MISC [id number]  Use to inject insulin once daily Lenoria Chime, MD  Active   Lancets (FREESTYLE) lancets [id number]  1 each by Other route See admin instructions for 30 doses. Check glucose 3 time per day and write result in logbook Ghimire, Henreitta Leber, MD  Active   losartan (COZAAR) 25 MG tablet [id number]  Take 1 tablet (25 mg total) by mouth at bedtime. Shary Key, DO  Active   methocarbamol (ROBAXIN) 500 MG tablet [id number]  Take 1 tablet (500 mg total) by mouth 2 (two) times daily. Lacretia Leigh, MD  Active   norgestimate-ethinyl estradiol (SPRINTEC 28) 0.25-35 MG-MCG tablet [id number]   Take 1 tablet by mouth daily.  Patient not taking: Reported on 02/06/2022   Gerlene Fee, DO  Active   nystatin cream (MYCOSTATIN) [id number]  Apply to affected area 2 times daily  Patient not taking: Reported on 05/22/2021   Pearson Forster, NP  Active   Semaglutide,0.25 or 0.'5MG'$ /DOS, (OZEMPIC, 0.25 OR 0.5 MG/DOSE,) 2 MG/1.5ML SOPN [id number]  INJECT 0.'25MG'$  INTO THE SKIN ONCE WEEKLY. AFTER 4 DOSES, INCREASE TO 0.'5MG'$  ONCE WEEKLY Paige, Weldon Picking, DO  Active               Assessment/Plan:   Diabetes: - Currently uncontrolled based on A1c (12.4% in September 2023). However, patient reports all blood sugar readings of 80-115 mg/dL. Of note, patient has a long standing history of uncontrolled diabetes. Per chart review, the last documented A1c <10% was in 2017 with only very short periods of A1c being at goal since 2008.   - Patient has been out of Ozempic since the beginning of February and has not filled insulin in many months although she reports adherence to insulin.  - Recommend to  restart Ozempic at 0.25 mg weekly x2 weeks, if tolerated, increase to 0.5 mg weekly.  Continue Lantus 9 units daily. Can consider DCing insulin if next A1c is at goal. -Diabetic patient not on statin, will address at follow.  - Recommend to check glucose TID. Refilled testing supplies.    Hypertension: - Currently uncontrolled based on last office BP. Patient does not have a BP cuff at home.  - Reviewed long term cardiovascular and renal outcomes of uncontrolled blood pressure - Recommend to continue losartan 25 mg daily. Questionable adherence as this was last filled in October 2023. Will defer changes until patient is seen by her PCP.     Follow Up Plan:  Pharmacist: 1 month PCP: instructed patient to schedule visit as soon as we got off the phone  Joseph Art, Pharm.D. PGY-2 Ambulatory Care Pharmacy Resident

## 2022-07-21 ENCOUNTER — Telehealth: Payer: Self-pay | Admitting: Student

## 2022-07-21 NOTE — Telephone Encounter (Signed)
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 Last DOS was 02/06/22. Placed in [employer].

## 2022-07-22 NOTE — Telephone Encounter (Signed)
Reviewed form and placed in PCP's box for completion.  .Ashleymarie Granderson R Katelen Luepke, CMA  

## 2022-07-28 NOTE — Telephone Encounter (Signed)
Called pt to discuss DOT form and need for an appointment before I can complete it. She requests an appointment before April which I do not have available. She has had previous visits with pharmacy team and I believe she could benefit from a visit with them. She is scheduled with Dr. Valentina Lucks on 08/01/22

## 2022-08-01 ENCOUNTER — Ambulatory Visit: Payer: BC Managed Care – PPO | Admitting: Pharmacist

## 2022-08-05 ENCOUNTER — Ambulatory Visit (INDEPENDENT_AMBULATORY_CARE_PROVIDER_SITE_OTHER): Payer: BC Managed Care – PPO | Admitting: Pharmacist

## 2022-08-05 ENCOUNTER — Encounter: Payer: Self-pay | Admitting: Pharmacist

## 2022-08-05 VITALS — BP 112/72 | HR 91 | Wt 205.2 lb

## 2022-08-05 DIAGNOSIS — I152 Hypertension secondary to endocrine disorders: Secondary | ICD-10-CM | POA: Diagnosis not present

## 2022-08-05 DIAGNOSIS — E1159 Type 2 diabetes mellitus with other circulatory complications: Secondary | ICD-10-CM

## 2022-08-05 DIAGNOSIS — Z794 Long term (current) use of insulin: Secondary | ICD-10-CM | POA: Diagnosis not present

## 2022-08-05 DIAGNOSIS — E1140 Type 2 diabetes mellitus with diabetic neuropathy, unspecified: Secondary | ICD-10-CM | POA: Diagnosis not present

## 2022-08-05 DIAGNOSIS — E119 Type 2 diabetes mellitus without complications: Secondary | ICD-10-CM

## 2022-08-05 LAB — POCT GLYCOSYLATED HEMOGLOBIN (HGB A1C): HbA1c, POC (controlled diabetic range): 11.5 % — AB (ref 0.0–7.0)

## 2022-08-05 MED ORDER — INSULIN GLARGINE 100 UNIT/ML SOLOSTAR PEN: 14.0000 [IU] | PEN_INJECTOR | SUBCUTANEOUS | 11 refills | Status: DC

## 2022-08-05 MED ORDER — ACCU-CHEK GUIDE W/DEVICE KIT: PACK | 0 refills | Status: AC

## 2022-08-05 MED ORDER — SEMAGLUTIDE(0.25 OR 0.5MG/DOS) 2 MG/3ML ~~LOC~~ SOPN: PEN_INJECTOR | SUBCUTANEOUS | 3 refills | Status: DC

## 2022-08-05 MED ORDER — LOSARTAN POTASSIUM 25 MG PO TABS: 25.0000 mg | ORAL_TABLET | Freq: Every day | ORAL | 3 refills | Status: DC

## 2022-08-05 NOTE — Assessment & Plan Note (Signed)
Hypertension diagnosed in 2011. Blood pressure goal of <130/80 mmHg. Medication adherence poor. Blood pressure is controlled today.   Has not been able to obtain medication.  Given diabetes, plan to restart ARB.  -Restart Losartan 25 mg daily

## 2022-08-05 NOTE — Assessment & Plan Note (Signed)
Diabetes diagnosed in 2021. Patient is able to verbalize appropriate hypoglycemia management plan. Medication adherence appears poor. Control is suboptimal due to patient experiencing hypoglycemic events and elevated A1c of 11.5%. -Decreased dose of basal insulin Lantus (insulin glargine) from 20 units to 14 units daily in the morning. -Discontinued rapid insulin Novolog (insulin aspart) until home glucose values are obtained using the glucometer. -Increased dose of GLP-1 Ozempic (semaglutide) from 0.25 mg to 0.5 mg weekly -A1c scheduled for today -Ordered new AccuChek Guide Meter - Instructed patient to begin measuring blood glucose at home using glucometer and bring it to next visit. -Extensively discussed pathophysiology of diabetes, recommended lifestyle interventions, dietary effects on blood sugar control.  -Counseled on s/sx of and management of hypoglycemia.

## 2022-08-05 NOTE — Progress Notes (Signed)
S:     Chief Complaint  Patient presents with   Medication Management    Diabetes   37 y.o. female who presents for diabetes evaluation, education, and management.  PMH is significant for hypertension, diabetes, obesity.  Patient was referred and last seen by Primary Care Provider, Dr. Ronnald Ramp, on 07/21/22.  Today, patient arrives, with daughter, in good spirits and presents without any assistance.   Recently got insurance coverage, was off of medications for about a month. Upset stomach on Ozempic (semaglutide)  0.25 mg occasionally - has slightly improved. Needs losartan refill - does not have Novolog (insulin aspart).  Patient reports Diabetes was diagnosed in 2021.   Current diabetes medications include: Ozempic (Semaglutide) 0.25 mg weekly, Lantus (insulin glargine) 20 units daily, Novolog (insulin aspart) (patient does not have any supply) Current hypertension medications include: Losartan 25 mg (patient does not have any supply and has not taken in months)  Patient is not adherent to insulin aspart nor losartan medications as she had run out of supply and did not have insurance to obtain.  Insurance coverage: BCBS  Patient reports hypoglycemic events in the middle of the night - most recently occurred twice in the last week. Patient denies checking blood glucose at home - has lancets and test strips at home but has not received meter.  Requested meter prescription.   Patient denies nocturia (nighttime urination).  Patient denies neuropathy (nerve pain). Patient denies visual changes. Patient denies self foot exams.   Patient reported dietary habits: Eats 1-2 meals/day; reports skipping breakfast Lunch: sandwich, [employer] - cheeseburger, fries, tea Drinks: unsweet tea, water, ginger ale  O:   Review of Systems  All other systems reviewed and are negative.   Physical Exam Constitutional:      Appearance: Normal appearance.  Pulmonary:     Effort: Pulmonary effort  is normal.  Neurological:     Mental Status: She is alert.  Psychiatric:        Mood and Affect: Mood normal.        Behavior: Behavior normal.    Lab Results  Component Value Date   HGBA1C 11.5 (A) 08/05/2022   Vitals:   08/05/22 1550  BP: 112/72  Pulse: 91  SpO2: 100%    Lipid Panel     Component Value Date/Time   CHOL 142 05/20/2017 0915   TRIG 71 05/20/2017 0915   HDL 42 05/20/2017 0915   CHOLHDL 3.4 05/20/2017 0915   CHOLHDL 3.4 08/02/2013 1518   VLDL 20 08/02/2013 1518   LDLCALC 86 05/20/2017 0915   LDLDIRECT 98 07/01/2012 0954    Clinical Atherosclerotic Cardiovascular Disease (ASCVD): No   A/P: Diabetes diagnosed in 2021. Patient is able to verbalize appropriate hypoglycemia management plan. Medication adherence appears poor. Control is suboptimal due to patient experiencing hypoglycemic events and elevated A1c of 11.5%. -Decreased dose of basal insulin Lantus (insulin glargine) from 20 units to 14 units daily in the morning. -Discontinued rapid insulin Novolog (insulin aspart) until home glucose values are obtained using the glucometer. -Increased dose of GLP-1 Ozempic (semaglutide) from 0.25 mg to 0.5 mg weekly -A1c scheduled for today (11.5) -Ordered new AccuChek Guide Meter - Instructed patient to begin measuring blood glucose at home using glucometer and bring it to next visit. -Extensively discussed pathophysiology of diabetes, recommended lifestyle interventions, dietary effects on blood sugar control.  -Counseled on s/sx of and management of hypoglycemia.   Hypertension diagnosed in 2011. Blood pressure goal of <130/80 mmHg. Medication  adherence poor. Blood pressure is controlled today.   Has not been able to obtain medication.  Given diabetes, plan to restart ARB.  -Restart Losartan 25 mg daily  Written patient instructions provided. Patient verbalized understanding of treatment plan.  Total time in face to face counseling 30 minutes.    Follow-up:   Pharmacist in 3 weeks. Patient seen with Louanne Belton PharmD PGY-1 Pharmacy Resident and Estelle June, PharmD Candidate.

## 2022-08-05 NOTE — Patient Instructions (Addendum)
It was nice to see you today!  Your goal blood sugar is 80-130 before eating and less than 180 after eating.  Medication Changes: DECREASE Lantus (insulin glargine) to 14 units once daily RESTART losartan 25 mg once daily INCREASE Ozempic (semaglutide) to 0.5 mg once weekly  Check your blood sugar levels 5 times per week - try to get a mix of levels in the mornings as well as a few hours after a meal. Be sure to bring all your medications in with you to your next visit!  Keep up the good work with diet and exercise. Aim for a diet full of vegetables, fruit and lean meats (chicken, [place], fish). Try to limit salt intake by eating fresh or frozen vegetables (instead of canned), rinse canned vegetables prior to cooking and do not add any additional salt to meals.

## 2022-08-06 NOTE — Progress Notes (Signed)
Reviewed and agree with Dr Koval's plan.   

## 2022-08-08 ENCOUNTER — Other Ambulatory Visit (HOSPITAL_COMMUNITY): Payer: Self-pay

## 2022-08-12 ENCOUNTER — Telehealth: Payer: Self-pay | Admitting: Student

## 2022-08-12 NOTE — Telephone Encounter (Signed)
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

## 2022-08-12 NOTE — Telephone Encounter (Signed)
Form handed to patient by front office staff.

## 2022-08-13 ENCOUNTER — Other Ambulatory Visit: Payer: Medicaid Other

## 2022-08-13 NOTE — Progress Notes (Signed)
Attempted to contact patient regarding scheduled telephone appointment today. 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 Patient is being followed by CPP at [facility]. Will defer management to Dr. Valentina Lucks.   Patient has follow up scheduled with CPP on 08/25/2022.  Joseph Art, Pharm.D. PGY-2 Ambulatory Care Pharmacy Resident

## 2022-08-15 DIAGNOSIS — Z794 Long term (current) use of insulin: Secondary | ICD-10-CM | POA: Diagnosis not present

## 2022-08-15 DIAGNOSIS — E119 Type 2 diabetes mellitus without complications: Secondary | ICD-10-CM | POA: Diagnosis not present

## 2022-08-15 DIAGNOSIS — Z6838 Body mass index (BMI) 38.0-38.9, adult: Secondary | ICD-10-CM | POA: Diagnosis not present

## 2022-08-19 DIAGNOSIS — E119 Type 2 diabetes mellitus without complications: Secondary | ICD-10-CM | POA: Diagnosis not present

## 2022-08-19 DIAGNOSIS — Z794 Long term (current) use of insulin: Secondary | ICD-10-CM | POA: Diagnosis not present

## 2022-08-19 DIAGNOSIS — Z6838 Body mass index (BMI) 38.0-38.9, adult: Secondary | ICD-10-CM | POA: Diagnosis not present

## 2022-08-25 ENCOUNTER — Ambulatory Visit: Payer: BC Managed Care – PPO | Admitting: Pharmacist

## 2022-08-25 DIAGNOSIS — Z131 Encounter for screening for diabetes mellitus: Secondary | ICD-10-CM | POA: Diagnosis not present

## 2022-08-25 DIAGNOSIS — Z6838 Body mass index (BMI) 38.0-38.9, adult: Secondary | ICD-10-CM | POA: Diagnosis not present

## 2022-09-16 ENCOUNTER — Encounter: Payer: Self-pay | Admitting: Student

## 2022-09-16 ENCOUNTER — Other Ambulatory Visit: Payer: Self-pay

## 2022-09-16 DIAGNOSIS — Z794 Long term (current) use of insulin: Secondary | ICD-10-CM

## 2022-09-16 MED ORDER — INSULIN GLARGINE 100 UNIT/ML SOLOSTAR PEN: 14.0000 [IU] | PEN_INJECTOR | SUBCUTANEOUS | 11 refills | Status: DC

## 2022-09-16 MED ORDER — SEMAGLUTIDE(0.25 OR 0.5MG/DOS) 2 MG/3ML ~~LOC~~ SOPN: PEN_INJECTOR | SUBCUTANEOUS | 0 refills | Status: DC

## 2022-09-19 ENCOUNTER — Telehealth: Payer: Medicaid Other | Admitting: Physician Assistant

## 2022-09-19 DIAGNOSIS — E119 Type 2 diabetes mellitus without complications: Secondary | ICD-10-CM

## 2022-09-19 DIAGNOSIS — N921 Excessive and frequent menstruation with irregular cycle: Secondary | ICD-10-CM

## 2022-09-19 MED ORDER — INSULIN ASPART 100 UNIT/ML FLEXPEN: PEN_INJECTOR | SUBCUTANEOUS | 0 refills | Status: DC

## 2022-09-19 NOTE — Patient Instructions (Signed)
[name] [name], thank you for joining [name] Loveless, PA-C for today's virtual visit.  While this provider is not your primary care provider (PCP), if your PCP is located in our provider database this encounter information will be shared with them immediately following your visit.   A [name] MyChart account gives you access to today's visit and all your visits, tests, and labs performed at [facility] " click here if you don't have a [name] MyChart account or go to [email or web address]  Consent: (Patient) [name] [name] provided verbal consent for this virtual visit at the beginning of the encounter.  Current Medications:  Current Outpatient Medications:    Accu-Chek Softclix Lancets lancets, Use as directed to check blood glucose TID, Disp: 100 each, Rfl: 12   Blood Glucose Monitoring Suppl (ACCU-CHEK GUIDE) w/Device KIT, Use as directed to check blood glucose TID, Disp: 1 kit, Rfl: 0   glucose blood (ACCU-CHEK GUIDE) test strip, Use as directed to check blood glucose TID, Disp: 100 each, Rfl: 12   insulin aspart (NOVOLOG) 100 UNIT/ML FlexPen, CBG < 70: Implement Hypoglycemia measures CBG 70 - 120: 0 units CBG 121 - 150: 3 units CBG 151 - 200: 4 units CBG 201 - 250: 7 units CBG 251 - 300: 11 units CBG 301 - 350: 15 units CBG 351 - 400: 20 units CBG > 400: call MD, Disp: 15 mL, Rfl: 0   insulin glargine (LANTUS) 100 UNIT/ML Solostar Pen, Inject 14 Units into the skin every morning., Disp: 3 mL, Rfl: 11   Insulin Pen Needle (PEN NEEDLES) 32G X 4 MM MISC, Use to inject insulin once daily, Disp: 100 each, Rfl: 12   losartan (COZAAR) 25 MG tablet, Take 1 tablet (25 mg total) by mouth at bedtime., Disp: 90 tablet, Rfl: 3   Semaglutide,0.25 or 0.5MG /DOS, 2 MG/3ML SOPN, Inject 0.5 mg subcutaneously once weekly. Make apt before next refill, Disp: 9 mL, Rfl: 0  Current Facility-Administered Medications:    etonogestrel (NEXPLANON) implant 68 mg, 68 mg, Subdermal, Once,  Jones, Sarah, DO   Medications ordered in this encounter:  Meds ordered this encounter  Medications   insulin aspart (NOVOLOG) 100 UNIT/ML FlexPen    Sig: CBG < 70: Implement Hypoglycemia measures CBG 70 - 120: 0 units CBG 121 - 150: 3 units CBG 151 - 200: 4 units CBG 201 - 250: 7 units CBG 251 - 300: 11 units CBG 301 - 350: 15 units CBG 351 - 400: 20 units CBG > 400: call MD    Dispense:  15 mL    Refill:  0    Order Specific Question:   Supervising Provider    Answer:   Merrilee Jansky [id number]     *If you need refills on other medications prior to your next appointment, please contact your pharmacy*  Follow-Up: Call back or seek an in-person evaluation if the symptoms worsen or if the condition fails to improve as anticipated.  [facility] [phone]  Other Instructions  Hyperglycemia Hyperglycemia occurs when the level of sugar (glucose) in the blood is too high. Glucose is a type of sugar that provides the body's main source of energy. Certain hormones (insulin and glucagon) control the level of glucose in the blood. Insulin lowers blood glucose, and glucagon increases blood glucose. Hyperglycemia can result from not having enough insulin in the bloodstream, or from the body not responding normally to insulin. Hyperglycemia occurs most often in people who have diabetes (diabetes  mellitus), but it can happen in people who do not have diabetes. It can develop quickly, and it can be life-threatening if it causes you to become severely dehydrated (diabetic ketoacidosis or hyperglycemic hyperosmolar state). Severe hyperglycemia is a medical emergency. For most people with diabetes, a blood glucose level above 240 mg/dL is considered hyperglycemia. What are the causes? If you have diabetes, hyperglycemia may be caused by: Medicines that increase blood glucose or affect your diabetes control. Getting less physical activity. Eating more than planned. Being sick or  injured, having an infection, or having surgery. Stress. Not giving yourself enough insulin (if you are taking insulin). If you have undiagnosed diabetes, this may be the reason you have hyperglycemia. If you do not have diabetes, hyperglycemia may be caused by: Certain medicines, including: Steroid medicines. Beta-blockers. Epinephrine. Thiazide diuretics. Stress. Having a serious illness, an infection, or surgery. Diseases of the pancreas. What increases the risk? Hyperglycemia is more likely to develop in people who have risk factors for diabetes, such as: Having a family member with diabetes. Certain conditions in which the body's disease-fighting system (immune system) attacks itself (autoimmune disorders). Being overweight or obese. Having an inactive (sedentary) lifestyle. Having been diagnosed with insulin resistance. Having a history of prediabetes, gestational diabetes, or polycystic ovarian syndrome (PCOS). What are the signs or symptoms? Hyperglycemia may not cause any symptoms. If you do have symptoms, they may include: Increased thirst. Needing to urinate more often than usual. Hunger. Feeling very tired. Blurry vision. Other symptoms may develop if hyperglycemia gets worse, such as: Dry mouth. Abdominal pain. Loss of appetite. Fruity-smelling breath. Weakness. Unexpected weight loss. Tingling or numbness in the hands or feet. Headache. Cuts or bruises that are slow to heal. How is this diagnosed? Hyperglycemia is diagnosed with a blood test to measure your blood glucose level. This blood test is usually done while you are having symptoms. Your health care provider may also do a physical exam and review your medical history. You may have more tests to determine the cause of your hyperglycemia, such as: A fasting blood glucose (FBG) test. You will not be allowed to eat (you will fast) for at least 8 hours before a blood sample is taken. An A1C blood test. This  provides information about blood glucose control over the previous 2-3 months. An oral glucose tolerance test (OGTT). This measures your blood glucose at two times: After fasting. This is your baseline blood glucose level. 2 hours after drinking a beverage that contains glucose. How is this treated? Treatment depends on the cause of your hyperglycemia. Treatment may include: Taking medicine to regulate your blood glucose levels. If you take insulin or other diabetes medicines, your medicine or dosage may be adjusted. Lifestyle changes, such as exercising more, eating healthier foods, or losing weight. Treating an illness or infection. Checking your blood glucose more often. Stopping or reducing steroid medicines. If your hyperglycemia becomes severe and it results in diabetic ketoacidosis or hyperglycemic hyperosmolar state, you must be hospitalized and given IV fluids and IV insulin. Follow these instructions at home: General instructions Take over-the-counter and prescription medicines only as told by your health care provider. Do not use any products that contain nicotine or tobacco. These products include cigarettes, chewing tobacco, and vaping devices, such as e-cigarettes. If you need help quitting, ask your health care provider. If you drink alcohol: Limit how much you have to: 0-1 drink a day for women who are not pregnant. 0-2 drinks a day for men.  Know how much alcohol is in a drink. In the [place] one drink equals one 12 oz bottle of beer (355 mL), one 5 oz glass of wine (148 mL), or one 1 oz glass of hard liquor (44 mL). Learn to manage stress. If you need help with this, ask your health care provider. Do exercises as told by your health care provider. Keep all follow-up visits. This is important. Eating and drinking  Maintain a healthy weight. Stay hydrated, especially when you exercise, get sick, or spend time in hot temperatures. Drink enough fluid to keep your urine pale  yellow. If you have diabetes:  Know the symptoms of hyperglycemia. Follow your diabetes management plan as told by your health care provider. Make sure you: Take your insulin and medicines as told. Follow your exercise plan. Follow your meal plan. Eat on time, and do not skip meals. Check your blood glucose as often as told. Make sure to check your blood glucose before and after exercise. If you exercise longer or in a different way, check your blood glucose more often. Follow your sick day plan whenever you cannot eat or drink normally. Make this plan in advance with your health care provider. Share your diabetes management plan with people in your workplace, school, and household. Check your urine for ketones when you are ill and as told by your health care provider. Carry a medical alert card or wear medical alert jewelry. Where to find more information American Diabetes Association: www.diabetes.org Contact a health care provider if: Your blood glucose is at or above 240 mg/dL (09.8 mmol/L) for 2 days in a row. You have problems keeping your blood glucose in your target range. You have frequent episodes of hyperglycemia. You have signs of illness, such as nausea, vomiting, or fever. Get help right away if: Your blood glucose monitor reads "high" even when you are taking insulin. You have trouble breathing. You have a change in how you think, feel, or act (mental status). You have nausea or vomiting that does not go away. These symptoms may represent a serious problem that is an emergency. Do not wait to see if the symptoms will go away. Get medical help right away. Call your local emergency services (911 in the [place] Do not drive yourself to the hospital. Summary Hyperglycemia occurs when the level of sugar (glucose) in the blood is too high. Hyperglycemia can happen with or without diabetes, and severe hyperglycemia can be life-threatening. Hyperglycemia is diagnosed with a blood  test to measure your blood glucose level. This blood test is usually done while you are having symptoms. Your health care provider may also do a physical exam and review your medical history. If you have diabetes, follow your diabetes management plan as told by your health care provider. Contact your health care provider if you have problems keeping your blood glucose in your target range. This information is not intended to replace advice given to you by your health care provider. Make sure you discuss any questions you have with your health care provider. Document Revised: 02/10/2020 Document Reviewed: 02/10/2020 Elsevier Patient Education  2023 Elsevier Inc.   Menorrhagia Menorrhagia is when your monthly periods are heavy or last longer than normal. If you have this condition, bleeding and cramping may make it hard for you to do your daily activities. What are the causes? Common causes of this condition include: Growths in the womb (uterus). These are polyps or fibroids. These growths are not cancer. Problems with  two hormones called estrogen and progesterone. One of the ovaries not releasing an egg during one or more months. A problem with the thyroid gland. Having a device for birth control (IUD). Side effects of some medicines, such as NSAIDs or blood thinners. A disorder that stops the blood from clotting normally. What increases the risk? You are more likely to have this condition if you have cancer of the womb. What are the signs or symptoms? Having to change your pad or tampon every 1-2 hours because it is soaked. Needing to use pads and tampons at the same time because of heavy bleeding. Needing to wake up to change your pads or tampons during the night. Passing blood clots larger than 1 inch (2.5 cm) in size. Having bleeding that lasts for more than 7 days. Having symptoms of low iron levels (anemia), such as feeling tired or having shortness of breath. How is this  treated? You may not need to be treated for this condition. But if you need treatment, you may be given medicines: To reduce bleeding during your period. These include birth control medicines. To make your blood thick. This slows bleeding. To reduce swelling. Medicines that do this include ibuprofen. That have a hormone called progestin. That make the ovaries stop working for a short time. To treat low iron levels. You will be given iron pills if you have this condition. If medicines do not work, surgery may be done. Surgery may be done to: Remove a part of the lining of the womb. This lining is called the endometrium. This reduces bleeding during a period. Remove growths in the womb. These may be polyps or fibroids. Remove the entire lining of the womb. Remove the womb entirely. This procedure is called a hysterectomy. Follow these instructions at home: Medicines Take over-the-counter and prescription medicines only as told by your doctor. This includes iron pills. Do not change or switch medicines without asking your doctor. Do not take aspirin or medicines that contain aspirin 1 week before or during your period. Aspirin may make bleeding worse. Managing constipation Iron pills may cause trouble pooping (constipation). To prevent or treat problems when pooping, you may need to: Drink enough fluid to keep your pee (urine) pale yellow. Take over-the-counter or prescription medicines. Eat foods that are high in fiber. These include beans, whole grains, and fresh fruits and vegetables. Limit foods that are high in fat and sugar. These include fried or sweet foods. General instructions If you need to change your pad or tampon more than once every 2 hours, limit your activity until the bleeding stops. Eat healthy meals and foods that are high in iron. Foods that have a lot of iron include: Leafy green vegetables. Meat. Liver. Eggs. Whole-grain breads and cereals. Do not try to lose  weight until your heavy bleeding has stopped and you have normal amounts of iron in your blood. If you need to lose weight, work with your doctor. Keep all follow-up visits. Contact a doctor if: You soak through a pad or tampon every 1 or 2 hours, and this happens every time you have a period. You need to use pads and tampons at the same time because you are bleeding so much. You are taking medicine, and: You feel like you may vomit. You vomit. You have watery poop (diarrhea). You have other problems that may be related to the medicine you are taking. Get help right away if: You soak through more than a pad or tampon in 1  hour. You pass clots bigger than 1 inch (2.5 cm) wide. You feel short of breath. You feel like your heart is beating too fast. You feel dizzy or you faint. You feel very weak or tired. Summary Menorrhagia is when your menstrual periods are heavy or last longer than normal. You may not need to be treated for this condition. If you need treatment, you may be given medicines or have surgery. Take over-the-counter and prescription medicines only as told by your doctor. This includes iron pills. Get help right away if you soak through more than a pad or tampon in 1 hour or you pass large clots. Also, get help right away if you feel dizzy, short of breath, or very weak or tired. This information is not intended to replace advice given to you by your health care provider. Make sure you discuss any questions you have with your health care provider. Document Revised: 01/10/2020 Document Reviewed: 01/10/2020 Elsevier Patient Education  2023 Elsevier Inc.    If you have been instructed to have an in-person evaluation today at a local Urgent Care facility, please use the link below. It will take you to a list of all of our available [facility], including address, phone number and hours of operation. Please do not delay care.  [facility]  If you or a  family member do not have a primary care provider, use the link below to schedule a visit and establish care. When you choose a [facility] primary care physician or advanced practice provider, you gain a long-term partner in health. Find a Primary Care Provider  Learn more about [facility]'s in-office and virtual care options: [facility] - Get Care Now

## 2022-09-19 NOTE — Progress Notes (Signed)
Virtual Visit Consent   [name] [name], you are scheduled for a virtual visit with a [facility] provider today. Just as with appointments in the office, your consent must be obtained to participate. Your consent will be active for this visit and any virtual visit you may have with one of our providers in the next 365 days. If you have a MyChart account, a copy of this consent can be sent to you electronically.  As this is a virtual visit, video technology does not allow for your provider to perform a traditional examination. This may limit your provider's ability to fully assess your condition. If your provider identifies any concerns that need to be evaluated in person or the need to arrange testing (such as labs, EKG, etc.), we will make arrangements to do so. Although advances in technology are sophisticated, we cannot ensure that it will always work on either your end or our end. If the connection with a video visit is poor, the visit may have to be switched to a telephone visit. With either a video or telephone visit, we are not always able to ensure that we have a secure connection.  By engaging in this virtual visit, you consent to the provision of healthcare and authorize for your insurance to be billed (if applicable) for the services provided during this visit. Depending on your insurance coverage, you may receive a charge related to this service.  I need to obtain your verbal consent now. Are you willing to proceed with your visit today? [name] [name] has provided verbal consent on 09/19/2022 for a virtual visit (video or telephone). Margaretann Loveless, PA-C  Date: 09/19/2022 3:34 PM  Virtual Visit via Video Note   I, Margaretann Loveless, connected with  [name] [name]  ([id number], 1986-01-15) on 09/19/22 at  3:15 PM EDT by a video-enabled telemedicine application and verified that I am speaking with the correct person using two identifiers.  Location: Patient: Virtual Visit Location  Patient: Mobile Provider: Virtual Visit Location Provider: Home Office   I discussed the limitations of evaluation and management by telemedicine and the availability of in person appointments. The patient expressed understanding and agreed to proceed.    History of Present Illness: [name] [name] is a 37 y.o. who identifies as a female who was assigned female at birth, and is being seen today for feeling unwell today. Reports her blood sugar was 250 today. She reports she ran out her insulin. She has been out of it for a week. She reports she is out of her Lantus and Novolog. She did get a notification her Ozempic was ready for pick up. She has not received yet.   She also has a Nexplanon. She has had for about a year. She has had 4 menstrual cycles with it. The previous 3 have been normal. This one has been ongoing for 2 weeks. It is heavy. She has used 3 packs of menstrual pads since it started.   Problems:  Patient Active Problem List   Diagnosis Date Noted   Sinusitis, maxillary, chronic 10/13/2020   Depression, recurrent (HCC) 03/21/2020   Hx of menorrhagia 03/15/2020   Type 2 diabetes mellitus with diabetic neuropathy, unspecified (HCC) 03/14/2020   Pneumonia due to COVID-19 virus 12/28/2019   Acute hypoxemic respiratory failure due to COVID-19 (HCC) 12/28/2019   UTI (urinary tract infection) 12/28/2019   Sepsis (HCC) 12/28/2019   DKA (diabetic ketoacidoses) 12/28/2019   Anemia 04/15/2019   Abdominal pain 03/14/2019  Screening for malignant neoplasm of cervix 12/30/2018   Type 2 diabetes mellitus without complication, without long-term current use of insulin (HCC) 12/30/2018   Chest pain 05/20/2017   History of tubal ligation    Medication refill 07/22/2016   Vaginal discharge 05/16/2016   Constipation 03/04/2016   Yeast infection 02/11/2016   Vagina, candidiasis 03/24/2014   Alopecia areata 09/29/2013   Hypertension associated with diabetes (HCC) 08/09/2009   Type II  diabetes mellitus, uncontrolled 09/20/2007   Herpes simplex virus (HSV) infection 10/23/2006   Obesity 07/09/2006   RHINITIS, ALLERGIC 07/09/2006   PAPANICOLAOU SMEAR, ABNORMAL 07/09/2006    Allergies:  Allergies  Allergen Reactions   Metformin And Related Nausea And Vomiting    Unable to tolerate 500mg  XR daily   Medications:  Current Outpatient Medications:    Accu-Chek Softclix Lancets lancets, Use as directed to check blood glucose TID, Disp: 100 each, Rfl: 12   Blood Glucose Monitoring Suppl (ACCU-CHEK GUIDE) w/Device KIT, Use as directed to check blood glucose TID, Disp: 1 kit, Rfl: 0   glucose blood (ACCU-CHEK GUIDE) test strip, Use as directed to check blood glucose TID, Disp: 100 each, Rfl: 12   insulin aspart (NOVOLOG) 100 UNIT/ML FlexPen, CBG < 70: Implement Hypoglycemia measures CBG 70 - 120: 0 units CBG 121 - 150: 3 units CBG 151 - 200: 4 units CBG 201 - 250: 7 units CBG 251 - 300: 11 units CBG 301 - 350: 15 units CBG 351 - 400: 20 units CBG > 400: call MD, Disp: 15 mL, Rfl: 0   insulin glargine (LANTUS) 100 UNIT/ML Solostar Pen, Inject 14 Units into the skin every morning., Disp: 3 mL, Rfl: 11   Insulin Pen Needle (PEN NEEDLES) 32G X 4 MM MISC, Use to inject insulin once daily, Disp: 100 each, Rfl: 12   losartan (COZAAR) 25 MG tablet, Take 1 tablet (25 mg total) by mouth at bedtime., Disp: 90 tablet, Rfl: 3   Semaglutide,0.25 or 0.5MG /DOS, 2 MG/3ML SOPN, Inject 0.5 mg subcutaneously once weekly. Make apt before next refill, Disp: 9 mL, Rfl: 0  Current Facility-Administered Medications:    etonogestrel (NEXPLANON) implant 68 mg, 68 mg, Subdermal, Once, Jones, Sarah, DO  Observations/Objective: Patient is well-developed, well-nourished in no acute distress.  Resting comfortably  Head is normocephalic, atraumatic.  No labored breathing.  Speech is clear and coherent with logical content.  Patient is alert and oriented at baseline.    Assessment and Plan: 1. Type 2  diabetes mellitus without complication, without long-term current use of insulin (HCC) - insulin aspart (NOVOLOG) 100 UNIT/ML FlexPen; CBG < 70: Implement Hypoglycemia measures CBG 70 - 120: 0 units CBG 121 - 150: 3 units CBG 151 - 200: 4 units CBG 201 - 250: 7 units CBG 251 - 300: 11 units CBG 301 - 350: 15 units CBG 351 - 400: 20 units CBG > 400: call MD  Dispense: 15 mL; Refill: 0  2. Menorrhagia with irregular cycle  - Insulin (Novolog) refilled - Advised PCP refilled Lantus on 09/16/22 - Continue Ozempic - Continue to monitor glucose closely - Seek in person evaluation for heavy menstrual bleeding   Follow Up Instructions: I discussed the assessment and treatment plan with the patient. The patient was provided an opportunity to ask questions and all were answered. The patient agreed with the plan and demonstrated an understanding of the instructions.  A copy of instructions were sent to the patient via MyChart unless otherwise noted below.    The  patient was advised to call back or seek an in-person evaluation if the symptoms worsen or if the condition fails to improve as anticipated.  Time:  I spent 12 minutes with the patient via telehealth technology discussing the above problems/concerns.    Margaretann Loveless, PA-C

## 2022-09-29 ENCOUNTER — Ambulatory Visit: Payer: Medicaid Other | Admitting: Student

## 2022-10-12 ENCOUNTER — Ambulatory Visit (HOSPITAL_COMMUNITY)
Admission: EM | Admit: 2022-10-12 | Discharge: 2022-10-12 | Disposition: A | Discharge status: Home / Self Care | Payer: Medicaid Other | Attending: Emergency Medicine | Admitting: Emergency Medicine

## 2022-10-12 ENCOUNTER — Encounter (HOSPITAL_COMMUNITY): Payer: Self-pay

## 2022-10-12 DIAGNOSIS — T63441A Toxic effect of venom of bees, accidental (unintentional), initial encounter: Secondary | ICD-10-CM | POA: Diagnosis not present

## 2022-10-12 DIAGNOSIS — T7840XA Allergy, unspecified, initial encounter: Secondary | ICD-10-CM | POA: Diagnosis not present

## 2022-10-12 MED ORDER — DIPHENHYDRAMINE HCL 25 MG PO CAPS: 25.0000 mg | ORAL_CAPSULE | Freq: Four times a day (QID) | ORAL | 0 refills | Status: DC

## 2022-10-12 MED ORDER — EPINEPHRINE 0.3 MG/0.3ML IJ SOAJ: INTRAMUSCULAR | 1 refills | Status: AC

## 2022-10-12 MED ORDER — METHYLPREDNISOLONE ACETATE 80 MG/ML IJ SUSP
INTRAMUSCULAR | Status: AC
  Filled 2022-10-12: qty 1

## 2022-10-12 MED ORDER — DIPHENHYDRAMINE HCL 25 MG PO CAPS
ORAL_CAPSULE | ORAL | Status: AC
  Filled 2022-10-12: qty 1

## 2022-10-12 MED ORDER — DIPHENHYDRAMINE HCL 25 MG PO CAPS
25.0000 mg | ORAL_CAPSULE | Freq: Once | ORAL | Status: AC
  Administered 2022-10-12: 25 mg via ORAL

## 2022-10-12 MED ORDER — METHYLPREDNISOLONE ACETATE 80 MG/ML IJ SUSP
60.0000 mg | Freq: Once | INTRAMUSCULAR | Status: AC
  Administered 2022-10-12: 60 mg via INTRAMUSCULAR

## 2022-10-12 NOTE — ED Provider Notes (Signed)
[facility]    CSN: [id number] Arrival date & time: 10/12/22  1000    HISTORY   Chief Complaint  Patient presents with   Insect Bite    Puffy eyes   HPI [name] [name] is a pleasant, 37 y.o. female who presents to urgent care today. Patient complains of being stung by bee on the top of her head yesterday.  Patient states she woke up this morning with puffy eyes, throbbing headache where she was stung.  Patient denies difficulty maintaining airway, shortness of breath, rash.  Patient states has been stung multiple times in the past but this is the worst reaction she has ever had.  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  Fortunately, one of her passengers removed the stinger from her scalp.  The history is provided by the patient.   Past Medical History:  Diagnosis Date   Abnormal Pap smear    Diabetes mellitus    type II   GBS (group B streptococcus) UTI complicating pregnancy    Herpes simplex without mention of complication    Hypertension    Irregular menses    Obesity    Patient Active Problem List   Diagnosis Date Noted   Sinusitis, maxillary, chronic 10/13/2020   Depression, recurrent (HCC) 03/21/2020   Hx of menorrhagia 03/15/2020   Type 2 diabetes mellitus with diabetic neuropathy, unspecified (HCC) 03/14/2020   Pneumonia due to COVID-19 virus 12/28/2019   Acute hypoxemic respiratory failure due to COVID-19 (HCC) 12/28/2019   UTI (urinary tract infection) 12/28/2019   Sepsis (HCC) 12/28/2019   DKA (diabetic ketoacidoses) 12/28/2019   Anemia 04/15/2019   Abdominal pain 03/14/2019   Screening for malignant neoplasm of cervix 12/30/2018   Type 2 diabetes mellitus without complication, without long-term current use of insulin (HCC) 12/30/2018   Chest pain 05/20/2017   History of tubal ligation    Medication refill 07/22/2016   Vaginal  discharge 05/16/2016   Constipation 03/04/2016   Yeast infection 02/11/2016   Vagina, candidiasis 03/24/2014   Alopecia areata 09/29/2013   Hypertension associated with diabetes (HCC) 08/09/2009   Type II diabetes mellitus, uncontrolled 09/20/2007   Herpes simplex virus (HSV) infection 10/23/2006   Obesity 07/09/2006   RHINITIS, ALLERGIC 07/09/2006   PAPANICOLAOU SMEAR, ABNORMAL 07/09/2006   Past Surgical History:  Procedure Laterality Date   CESAREAN SECTION  01/30/2011   Procedure: CESAREAN SECTION;  Surgeon: Tilda Burrow, MD;  Location: [facility];  Service: Gynecology;  Laterality: N/A;   CESAREAN SECTION N/A 10/22/2014   Procedure: CESAREAN SECTION;  Surgeon: Reva Bores, MD;  Location: [facility];  Service: Obstetrics;  Laterality: N/A;   TUBAL LIGATION     WISDOM TOOTH EXTRACTION     OB History     Gravida  4   Para  2   Term  1   Preterm  1   AB  2   Living  2      SAB  2   IAB      Ectopic      Multiple  0   Live Births  2          Home Medications    Prior to Admission medications   Medication Sig Start Date End Date Taking? Authorizing Provider  insulin aspart (NOVOLOG) 100 UNIT/ML FlexPen CBG < 70: Implement Hypoglycemia measures CBG 70 - 120: 0 units CBG 121 -  150: 3 units CBG 151 - 200: 4 units CBG 201 - 250: 7 units CBG 251 - 300: 11 units CBG 301 - 350: 15 units CBG 351 - 400: 20 units CBG > 400: call MD 09/19/22  Yes Margaretann Loveless, PA-C  insulin glargine (LANTUS) 100 UNIT/ML Solostar Pen Inject 14 Units into the skin every morning. 09/16/22  Yes Erick Alley, DO  Semaglutide,0.25 or 0.5MG /DOS, 2 MG/3ML SOPN Inject 0.5 mg subcutaneously once weekly. Make apt before next refill 09/16/22  Yes Erick Alley, DO  Accu-Chek Softclix Lancets lancets Use as directed to check blood glucose TID 07/16/22   Erick Alley, DO  Blood Glucose Monitoring Suppl (ACCU-CHEK GUIDE) w/Device KIT Use as directed to check blood glucose TID 08/05/22   McDiarmid, Leighton Roach, MD   glucose blood (ACCU-CHEK GUIDE) test strip Use as directed to check blood glucose TID 07/16/22   Erick Alley, DO  Insulin Pen Needle (PEN NEEDLES) 32G X 4 MM MISC Use to inject insulin once daily 05/22/21   Billey Co, MD  losartan (COZAAR) 25 MG tablet Take 1 tablet (25 mg total) by mouth at bedtime. 08/05/22   McDiarmid, Leighton Roach, MD    Family History Family History  Problem Relation Age of Onset   Diabetes Paternal Grandmother    Hypertension Paternal Grandmother    Diabetes Maternal Grandmother    Diabetes Father    Cancer Paternal Uncle    Breast cancer Maternal Aunt    Breast cancer Paternal Aunt    Social History Social History   Tobacco Use   Smoking status: Former   Smokeless tobacco: Never  Building services engineer Use: Never used  Substance Use Topics   Alcohol use: Yes    Comment: "occassionally"   Drug use: No   Allergies   Metformin and related  Review of Systems Review of Systems Pertinent findings revealed after performing a 14 point review of systems has been noted in the history of present illness.  Physical Exam Vital Signs BP 113/73 (BP Location: Right Arm)   Pulse 89   Temp 99.5 F (37.5 C) (Oral)   Resp 18   Ht 5\' 1"  (1.549 m)   Wt 205 lb (93 kg)   SpO2 99%   BMI 38.73 kg/m   No data found.  Physical Exam Vitals and nursing note reviewed.  Constitutional:      General: She is not in acute distress.    Appearance: Normal appearance. She is not ill-appearing.  HENT:     Head: Normocephalic and atraumatic.     Salivary Glands: Right salivary gland is not diffusely enlarged or tender. Left salivary gland is not diffusely enlarged or tender.     Right Ear: Tympanic membrane, ear canal and external ear normal. No drainage. No middle ear effusion. There is no impacted cerumen. Tympanic membrane is not erythematous or bulging.     Left Ear: Tympanic membrane, ear canal and external ear normal. No drainage.  No middle ear effusion. There is no  impacted cerumen. Tympanic membrane is not erythematous or bulging.     Nose: Nose normal. No nasal deformity, septal deviation, mucosal edema, congestion or rhinorrhea.     Right Turbinates: Not enlarged, swollen or pale.     Left Turbinates: Not enlarged, swollen or pale.     Right Sinus: No maxillary sinus tenderness or frontal sinus tenderness.     Left Sinus: No maxillary sinus tenderness or frontal sinus tenderness.  Mouth/Throat:     Lips: Pink. No lesions.     Mouth: Mucous membranes are moist. No oral lesions.     Pharynx: Oropharynx is clear. Uvula midline. No posterior oropharyngeal erythema or uvula swelling.     Tonsils: No tonsillar exudate. 0 on the right. 0 on the left.  Eyes:     General:        Right eye: No discharge.        Left eye: No discharge.     Extraocular Movements: Extraocular movements intact.     Conjunctiva/sclera: Conjunctivae normal.     Right eye: Right conjunctiva is not injected.     Left eye: Left conjunctiva is not injected.     Comments: Puffiness of bilateral upper and lower eyelids  Neck:     Trachea: Trachea and phonation normal.  Cardiovascular:     Rate and Rhythm: Normal rate and regular rhythm.     Pulses: Normal pulses.     Heart sounds: Normal heart sounds. No murmur heard.    No friction rub. No gallop.  Pulmonary:     Effort: Pulmonary effort is normal. No accessory muscle usage, prolonged expiration or respiratory distress.     Breath sounds: Normal breath sounds. No stridor, decreased air movement or transmitted upper airway sounds. No decreased breath sounds, wheezing, rhonchi or rales.  Chest:     Chest wall: No tenderness.  Musculoskeletal:        General: Normal range of motion.     Cervical back: Normal range of motion and neck supple. Normal range of motion.  Lymphadenopathy:     Cervical: No cervical adenopathy.  Skin:    General: Skin is warm and dry.     Coloration: Skin is not mottled.     Findings: No abrasion,  abscess, ecchymosis, erythema, signs of injury, laceration, lesion, petechiae, rash or wound.  Neurological:     General: No focal deficit present.     Mental Status: She is alert and oriented to person, place, and time.  Psychiatric:        Mood and Affect: Mood normal.        Behavior: Behavior normal.     Visual Acuity Right Eye Distance:   Left Eye Distance:   Bilateral Distance:    Right Eye Near:   Left Eye Near:    Bilateral Near:     UC Couse / Diagnostics / Procedures:     Radiology No results found.  Procedures Procedures (including critical care time) EKG  Pending results:  Labs Reviewed - No data to display  Medications Ordered in UC: Medications  methylPREDNISolone acetate (DEPO-MEDROL) injection 60 mg (60 mg Intramuscular Given 10/12/22 1041)  diphenhydrAMINE (BENADRYL) capsule 25 mg (25 mg Oral Given 10/12/22 1041)    UC Diagnoses / Final Clinical Impressions(s)   I have reviewed the triage vital signs and the nursing notes.  Pertinent labs & imaging results that were available during my care of the patient were reviewed by me and considered in my medical decision making (see chart for details).    Final diagnoses:  Bee sting, accidental or unintentional, initial encounter  Allergic reaction, initial encounter   Patient was provided with an injection of methylprednisolone and given Benadryl 25 mg during her visit today.  Patient was provided with Benadryl to that she has been instructed to take every 6 hours for the next 72 hours total.  Patient also provided with an EpiPen given that she is frequently  outside and has been stung by bees multiple times while at work.  Patient provided with information to provide to her supervisor who does not seem to understand that this is a serious issue.  Conservative care recommended, emergency precautions advised.  Please see discharge instructions below for details of plan of care as provided to patient. ED  Prescriptions     Medication Sig Dispense Auth. Provider   diphenhydrAMINE (BENADRYL) 25 mg capsule Take 1 capsule (25 mg total) by mouth every 6 (six) hours for 11 doses. 11 capsule Theadora Rama Scales, PA-C   EPINEPHrine (EPIPEN 2-PAK) 0.3 mg/0.3 mL IJ SOAJ injection Inject one pin at onset of throat tightening or shortness of breath.  CALL 911.  Repeat injection with second pen after 5 minutes if no improvement after first injection. 1 each Theadora Rama Scales, PA-C      PDMP not reviewed this encounter.  Pending results:  Labs Reviewed - No data to display  Discharge Instructions:   Discharge Instructions      I have enclosed some information about bee stings that I hope you (and your manager) find informative.  I have sent a prescription for Benadryl to your pharmacy.  Please take 1 capsule every 6 hours for the next 3 days for a total of 12 doses, you received your first dose here at urgent care.  I have also sent a prescription for a 2 pack of EpiPens.  If you are stung by another bee you begin to feel your throat closing or you become short of breath, please inject yourself with the first EpiPen and immediately call 911.  Inject yourself first.  After 5 minutes, if you have not had meaningful improvement of your symptoms, please inject the second pen.  Please be sure that you always have these pads on your person at all times, especially when you are working.  Thank you for visiting [facility] today.      Disposition Upon Discharge:  Condition: stable for discharge home  Patient presented with an acute illness with associated systemic symptoms and significant discomfort requiring urgent management. In my opinion, this is a condition that a prudent lay person (someone who possesses an average knowledge of health and medicine) may potentially expect to result in complications if not addressed urgently such as respiratory distress, impairment of bodily  function or dysfunction of bodily organs.   Routine symptom specific, illness specific and/or disease specific instructions were discussed with the patient and/or caregiver at length.   As such, the patient has been evaluated and assessed, work-up was performed and treatment was provided in alignment with urgent care protocols and evidence based medicine.  Patient/parent/caregiver has been advised that the patient may require follow up for further testing and treatment if the symptoms continue in spite of treatment, as clinically indicated and appropriate.  Patient/parent/caregiver has been advised to return to the [facility] or PCP if no better; to PCP or the Emergency Department if new signs and symptoms develop, or if the current signs or symptoms continue to change or worsen for further workup, evaluation and treatment as clinically indicated and appropriate  The patient will follow up with their current PCP if and as advised. If the patient does not currently have a PCP we will assist them in obtaining one.   The patient may need specialty follow up if the symptoms continue, in spite of conservative treatment and management, for further workup, evaluation, consultation and treatment as clinically indicated and appropriate.  Patient/parent/caregiver verbalized understanding and agreement of plan as discussed.  All questions were addressed during visit.  Please see discharge instructions below for further details of plan.  This office note has been dictated using Teaching laboratory technician.  Unfortunately, this method of dictation can sometimes lead to typographical or grammatical errors.  I apologize for your inconvenience in advance if this occurs.  Please do not hesitate to reach out to me if clarification is needed.      Theadora Rama Scales, PA-C 10/13/22 1129

## 2022-10-12 NOTE — ED Triage Notes (Signed)
Patient here today due to having a bee sting on the top of her head yesterday. She woke up this morning with puffy eyes and her head is throbbing where the bee stung her. No SOB.

## 2022-10-12 NOTE — Discharge Instructions (Addendum)
I have enclosed some information about bee stings that I hope you (and Nurse, children's) find informative.  I have sent a prescription for Benadryl to your pharmacy.  Please take 1 capsule every 6 hours for the next 3 days for a total of 12 doses, you received your first dose here at urgent care.  I have also sent a prescription for a 2 pack of EpiPens.  If you are stung by another bee you begin to feel your throat closing or you become short of breath, please inject yourself with the first EpiPen and immediately call 911.  Inject yourself first.  After 5 minutes, if you have not had meaningful improvement of your symptoms, please inject the second pen.  Please be sure that you always have these pads on your person at all times, especially when you are working.  Thank you for visiting [facility] today.

## 2022-10-25 ENCOUNTER — Emergency Department (HOSPITAL_COMMUNITY)
Admission: EM | Admit: 2022-10-25 | Discharge: 2022-10-25 | Disposition: A | Discharge status: Home / Self Care | Payer: Medicaid Other | Attending: Emergency Medicine | Admitting: Emergency Medicine

## 2022-10-25 ENCOUNTER — Other Ambulatory Visit: Payer: Self-pay

## 2022-10-25 ENCOUNTER — Encounter (HOSPITAL_COMMUNITY): Payer: Self-pay

## 2022-10-25 DIAGNOSIS — E119 Type 2 diabetes mellitus without complications: Secondary | ICD-10-CM | POA: Diagnosis not present

## 2022-10-25 DIAGNOSIS — M79602 Pain in left arm: Secondary | ICD-10-CM | POA: Insufficient documentation

## 2022-10-25 DIAGNOSIS — I10 Essential (primary) hypertension: Secondary | ICD-10-CM | POA: Insufficient documentation

## 2022-10-25 DIAGNOSIS — Y92410 Unspecified street and highway as the place of occurrence of the external cause: Secondary | ICD-10-CM | POA: Insufficient documentation

## 2022-10-25 DIAGNOSIS — Z7984 Long term (current) use of oral hypoglycemic drugs: Secondary | ICD-10-CM | POA: Insufficient documentation

## 2022-10-25 DIAGNOSIS — Z794 Long term (current) use of insulin: Secondary | ICD-10-CM | POA: Diagnosis not present

## 2022-10-25 DIAGNOSIS — M25512 Pain in left shoulder: Secondary | ICD-10-CM | POA: Diagnosis not present

## 2022-10-25 MED ORDER — ACETAMINOPHEN 325 MG PO TABS
650.0000 mg | ORAL_TABLET | Freq: Once | ORAL | Status: AC
  Administered 2022-10-25: 650 mg via ORAL
  Filled 2022-10-25: qty 2

## 2022-10-25 MED ORDER — KETOROLAC TROMETHAMINE 15 MG/ML IJ SOLN
15.0000 mg | Freq: Once | INTRAMUSCULAR | Status: AC
  Administered 2022-10-25: 15 mg via INTRAMUSCULAR
  Filled 2022-10-25: qty 1

## 2022-10-25 NOTE — Discharge Instructions (Signed)
Return to the ED with any new or worsening signs or symptoms You may continue taking ibuprofen or Tylenol at home.  Please also begin taking muscle relaxers that I prescribed you.  Please do not take these and drive or operate heavy machinery as they will cause you to become lightheaded and dizzy Please follow-up with your primary care doctor for reevaluation
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
## 2022-11-09 ENCOUNTER — Other Ambulatory Visit: Payer: Self-pay | Admitting: Physician Assistant

## 2022-11-09 DIAGNOSIS — E119 Type 2 diabetes mellitus without complications: Secondary | ICD-10-CM

## 2022-11-10 ENCOUNTER — Encounter: Payer: Self-pay | Admitting: Student

## 2022-12-08 ENCOUNTER — Ambulatory Visit (HOSPITAL_COMMUNITY)
Admission: EM | Admit: 2022-12-08 | Discharge: 2022-12-08 | Disposition: A | Discharge status: Home / Self Care | Payer: Medicaid Other | Attending: Internal Medicine | Admitting: Internal Medicine

## 2022-12-08 ENCOUNTER — Encounter (HOSPITAL_COMMUNITY): Payer: Self-pay | Admitting: Emergency Medicine

## 2022-12-08 DIAGNOSIS — S53401A Unspecified sprain of right elbow, initial encounter: Secondary | ICD-10-CM

## 2022-12-08 DIAGNOSIS — M549 Dorsalgia, unspecified: Secondary | ICD-10-CM | POA: Diagnosis not present

## 2022-12-08 MED ORDER — IBUPROFEN 600 MG PO TABS: 600.0000 mg | ORAL_TABLET | Freq: Four times a day (QID) | ORAL | 0 refills | Status: DC | PRN

## 2022-12-08 MED ORDER — METHOCARBAMOL 500 MG PO TABS: 500.0000 mg | ORAL_TABLET | Freq: Every evening | ORAL | 0 refills | Status: DC | PRN

## 2022-12-08 NOTE — Discharge Instructions (Addendum)
Heating pad use only 20 minutes on-20 minutes off cycle Gentle stretching exercises as the pain improves Please take medications as prescribed Apply right elbow sleeve to help with pain on movement Please take muscle relaxant only at bedtime.  Do not drive or operate heavy machinery after you take muscle relaxants. Return to urgent care if you have worsening symptoms.

## 2022-12-08 NOTE — ED Provider Notes (Signed)
[facility]    CSN: [id number] Arrival date & time: 12/08/22  0806      History   Chief Complaint Chief Complaint  Patient presents with  . Arm Pain  . Back Pain    HPI [name] [name] is a 37 y.o. female.   HPI  Past Medical History:  Diagnosis Date  . Abnormal Pap smear   . Diabetes mellitus    type II  . GBS (group B streptococcus) UTI complicating pregnancy   . Herpes simplex without mention of complication   . Hypertension   . Irregular menses   . Obesity     Patient Active Problem List   Diagnosis Date Noted  . Sinusitis, maxillary, chronic 10/13/2020  . Depression, recurrent (HCC) 03/21/2020  . Hx of menorrhagia 03/15/2020  . Type 2 diabetes mellitus with diabetic neuropathy, unspecified (HCC) 03/14/2020  . Pneumonia due to COVID-19 virus 12/28/2019  . Acute hypoxemic respiratory failure due to COVID-19 (HCC) 12/28/2019  . UTI (urinary tract infection) 12/28/2019  . Sepsis (HCC) 12/28/2019  . DKA (diabetic ketoacidoses) 12/28/2019  . Anemia 04/15/2019  . Abdominal pain 03/14/2019  . Screening for malignant neoplasm of cervix 12/30/2018  . Type 2 diabetes mellitus without complication, without long-term current use of insulin (HCC) 12/30/2018  . Chest pain 05/20/2017  . History of tubal ligation   . Medication refill 07/22/2016  . Vaginal discharge 05/16/2016  . Constipation 03/04/2016  . Yeast infection 02/11/2016  . Vagina, candidiasis 03/24/2014  . Alopecia areata 09/29/2013  . Hypertension associated with diabetes (HCC) 08/09/2009  . Type II diabetes mellitus, uncontrolled 09/20/2007  . 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 Obesity 07/09/2006  . RHINITIS, ALLERGIC 07/09/2006  . PAPANICOLAOU SMEAR, ABNORMAL 07/09/2006    Past Surgical History:  Procedure Laterality Date  . CESAREAN SECTION  01/30/2011   Procedure: CESAREAN SECTION;  Surgeon: Tilda Burrow, MD;  Location: [facility];  Service: Gynecology;  Laterality:  N/A;  . CESAREAN SECTION N/A 10/22/2014   Procedure: CESAREAN SECTION;  Surgeon: Reva Bores, MD;  Location: [facility];  Service: Obstetrics;  Laterality: N/A;  . TUBAL LIGATION    . WISDOM TOOTH EXTRACTION      OB History     Gravida  4   Para  2   Term  1   Preterm  1   AB  2   Living  2      SAB  2   IAB      Ectopic      Multiple  0   Live Births  2            Home Medications    Prior to Admission medications   Medication Sig Start Date End Date Taking? Authorizing Provider  ibuprofen (ADVIL) 600 MG tablet Take 1 tablet (600 mg total) by mouth every 6 (six) hours as needed. 12/08/22  Yes Acire Tang, Britta Mccreedy, MD  methocarbamol (ROBAXIN) 500 MG tablet Take 1 tablet (500 mg total) by mouth at bedtime as needed for muscle spasms. 12/08/22  Yes Zyan Mirkin, Britta Mccreedy, MD  Accu-Chek Softclix Lancets lancets Use as directed to check blood glucose TID 07/16/22   Erick Alley, DO  Blood Glucose Monitoring Suppl (ACCU-CHEK GUIDE) w/Device KIT Use as directed to check blood glucose TID 08/05/22   McDiarmid, Leighton Roach, MD  diphenhydrAMINE (BENADRYL) 25 mg capsule Take 1 capsule (25 mg total) by mouth every 6 (six) hours for 11 doses. 10/12/22 10/15/22  Theadora Rama Scales, PA-C  EPINEPHrine (EPIPEN 2-PAK) 0.3 mg/0.3 mL IJ SOAJ injection Inject one pin at onset of throat tightening or shortness of breath.  CALL 911.  Repeat injection with second pen after 5 minutes if no improvement after first injection. 10/12/22   Theadora Rama Scales, PA-C  glucose blood (ACCU-CHEK GUIDE) test strip Use as directed to check blood glucose TID 07/16/22   Erick Alley, DO  insulin aspart (NOVOLOG) 100 UNIT/ML FlexPen CBG < 70: Implement Hypoglycemia measures CBG 70 - 120: 0 units CBG 121 - 150: 3 units CBG 151 - 200: 4 units CBG 201 - 250: 7 units CBG 251 - 300: 11 units CBG 301 - 350: 15 units CBG 351 - 400: 20 units CBG > 400: call MD 09/19/22   Margaretann Loveless, PA-C  insulin glargine (LANTUS) 100  UNIT/ML Solostar Pen Inject 14 Units into the skin every morning. 09/16/22   Erick Alley, DO  Insulin Pen Needle (PEN NEEDLES) 32G X 4 MM MISC Use to inject insulin once daily 05/22/21   Billey Co, MD  Semaglutide,0.25 or 0.5MG /DOS, 2 MG/3ML SOPN Inject 0.5 mg subcutaneously once weekly. Make apt before next refill 09/16/22   Erick Alley, DO    Family History Family History  Problem Relation Age of Onset  . Diabetes Paternal Grandmother   . Hypertension Paternal Grandmother   . Diabetes Maternal Grandmother   . Diabetes Father   . Cancer Paternal Uncle   . Breast cancer Maternal Aunt   . Breast cancer Paternal Aunt     Social History Social History   Tobacco Use  . Smoking status: Former  . Smokeless tobacco: Never  Vaping Use  . Vaping status: Never Used  Substance Use Topics  . Alcohol use: Yes    Comment: "occassionally"  . Drug use: No     Allergies   Bee venom and Metformin and related   Review of Systems Review of Systems   Physical Exam Triage Vital Signs ED Triage Vitals [12/08/22 0824]  Encounter Vitals Group     BP 128/85     Systolic BP Percentile      Diastolic BP Percentile      Pulse Rate 78     Resp 17     Temp 98.5 F (36.9 C)     Temp Source Oral     SpO2 98 %     Weight      Height      Head Circumference      Peak Flow      Pain Score 8     Pain Loc      Pain Education      Exclude from Growth Chart    No data found.  Updated Vital Signs BP 128/85 (BP Location: Left Arm)   Pulse 78   Temp 98.5 F (36.9 C) (Oral)   Resp 17   SpO2 98%   Visual Acuity Right Eye Distance:   Left Eye Distance:   Bilateral Distance:    Right Eye Near:   Left Eye Near:    Bilateral Near:     Physical Exam   UC Treatments / Results  Labs (all labs ordered are listed, but only abnormal results are displayed) Labs Reviewed - No data to display  EKG   Radiology No results found.  Procedures Procedures (including critical care  time)  Medications Ordered in UC Medications - No data to display  Initial Impression / Assessment and Plan /  UC Course  I have reviewed the triage vital signs and the nursing notes.  Pertinent labs & imaging results that were available during my care of the patient were reviewed by me and considered in my medical decision making (see chart for details).     *** Final Clinical Impressions(s) / UC Diagnoses   Final diagnoses:  Elbow sprain, right, initial encounter  Mid back pain on right side     Discharge Instructions      Heating pad use only 20 minutes on-20 minutes off cycle Gentle stretching exercises as the pain improves Please take medications as prescribed Apply right elbow sleeve to help with pain on movement Please take muscle relaxant only at bedtime.  Do not drive or operate heavy machinery after you take muscle relaxants. Return to urgent care if you have worsening symptoms.   ED Prescriptions     Medication Sig Dispense Auth. Provider   ibuprofen (ADVIL) 600 MG tablet Take 1 tablet (600 mg total) by mouth every 6 (six) hours as needed. 30 tablet Samari Bittinger, Britta Mccreedy, MD   methocarbamol (ROBAXIN) 500 MG tablet Take 1 tablet (500 mg total) by mouth at bedtime as needed for muscle spasms. 7 tablet Sindi Beckworth, Britta Mccreedy, MD      PDMP not reviewed this encounter.

## 2022-12-08 NOTE — ED Triage Notes (Signed)
Pt c/o right elbow pain that started yesterday. Denies injury but did go swimming.  Reports that this morning her dog pulled her when they were going down the wet stairs causing her to fall. Pt states made right elbow hurt worse as well as her right side of her back.

## 2023-02-19 ENCOUNTER — Ambulatory Visit (INDEPENDENT_AMBULATORY_CARE_PROVIDER_SITE_OTHER): Payer: Medicaid Other | Admitting: Student

## 2023-02-19 ENCOUNTER — Encounter (HOSPITAL_COMMUNITY): Payer: Self-pay

## 2023-02-19 ENCOUNTER — Other Ambulatory Visit: Payer: Self-pay

## 2023-02-19 ENCOUNTER — Emergency Department (HOSPITAL_COMMUNITY)
Admission: EM | Admit: 2023-02-19 | Discharge: 2023-02-20 | Disposition: A | Discharge status: Home / Self Care | Payer: Medicaid Other | Attending: Emergency Medicine | Admitting: Emergency Medicine

## 2023-02-19 VITALS — BP 128/94 | HR 89 | Ht 61.0 in | Wt 204.6 lb

## 2023-02-19 DIAGNOSIS — L0291 Cutaneous abscess, unspecified: Secondary | ICD-10-CM

## 2023-02-19 DIAGNOSIS — E1140 Type 2 diabetes mellitus with diabetic neuropathy, unspecified: Secondary | ICD-10-CM

## 2023-02-19 DIAGNOSIS — Z794 Long term (current) use of insulin: Secondary | ICD-10-CM | POA: Diagnosis not present

## 2023-02-19 DIAGNOSIS — Z1159 Encounter for screening for other viral diseases: Secondary | ICD-10-CM

## 2023-02-19 DIAGNOSIS — E119 Type 2 diabetes mellitus without complications: Secondary | ICD-10-CM

## 2023-02-19 DIAGNOSIS — L02215 Cutaneous abscess of perineum: Secondary | ICD-10-CM | POA: Insufficient documentation

## 2023-02-19 LAB — POCT GLYCOSYLATED HEMOGLOBIN (HGB A1C): HbA1c, POC (controlled diabetic range): 12 % — AB (ref 0.0–7.0)

## 2023-02-19 MED ORDER — INSULIN ASPART 100 UNIT/ML FLEXPEN: PEN_INJECTOR | SUBCUTANEOUS | 0 refills | Status: DC

## 2023-02-19 MED ORDER — INSULIN GLARGINE 100 UNIT/ML SOLOSTAR PEN: 14.0000 [IU] | PEN_INJECTOR | SUBCUTANEOUS | 11 refills | Status: DC

## 2023-02-19 MED ORDER — SEMAGLUTIDE (1 MG/DOSE) 4 MG/3ML ~~LOC~~ SOPN: 1.0000 mg | PEN_INJECTOR | SUBCUTANEOUS | 3 refills | Status: DC

## 2023-02-19 NOTE — ED Triage Notes (Addendum)
Patient is c/o cyst in bikini area, states she has had one before that required I&D. Site appears to have localized redness and edema. Patient reports 7/10 pain, no fevers or chills, no relief with warm compresses.

## 2023-02-19 NOTE — Progress Notes (Signed)
    SUBJECTIVE:   CHIEF COMPLAINT / HPI:   Patient is a 37 year old female with history of type 2 diabetes presenting today for diabetes follow-up and medication refill. Per patient she has been out of her medication for about a week and a half. She is on Lantus and NovoLog.  She's unsure of how she takes the NovoLog but endorses taking the Lantus 14 units daily.  She is also on Ozempic 0.5 mg weekly. Denies any episodes of hypoglycemia or hyperglycemic symptoms. Patient does not check her glucose reading at home due to discomfort and pain with pin prick. Last A1c 6 months ago was 11.5  PERTINENT  PMH / PSH: Reviewed   OBJECTIVE:   BP (!) 128/94   Pulse 89   Ht 5\' 1"  (1.549 m)   Wt 204 lb 9.6 oz (92.8 kg)   SpO2 100%   BMI 38.66 kg/m    Physical Exam General: Alert, well appearing, NAD Cardiovascular: RRR, No Murmurs, Normal S2/S2 Respiratory: CTAB, No wheezing or Rales Abdomen: No distension or tenderness Extremities: No edema on extremities     ASSESSMENT/PLAN:   Type 2 diabetes mellitus without complication, without long-term current use of insulin (HCC) Poorly controlled diabetes with most recent A1c of 11.5 from 6 months ago.  Suspect possible noncompliance as patient is unsure of how she is supposed to take her NovoLog.  However still taking her Lantus 14U daily and Ozempic weekly.  She is currently not checking her home blood glucose readings mostly due to pain with pricking her finger.  Patient will most likely benefit from CGM which was discussed with patient and she is open to starting CGM. Attempted to get microalbuminuria/creatinine, however patient is unable to void.  Will most likely be done at next diabetes follow-up in 3 months. -Refilled patient's Lantus, NovoLog -Increased patient's Ozempic to 1 mg weekly -Obtained lab A1c, lipid panel, BMP -Advised patient to schedule a pharmacy appointment for CGM -Discussed diet and exercise   Hepatitis C screening Ordered  lab for hepatitis C antibody.  Jerre Simon, MD [facility]

## 2023-02-19 NOTE — Patient Instructions (Addendum)
It was wonderful to meet you today. Thank you for allowing me to be a part of your care. Below is a short summary of what we discussed at your visit today:   Today we collected lab to check your A1c, your cholesterol level and your hepatitis C.  I have refilled your Lantus, and NovoLog.  I have also increased your dose of the Ozempic to 1 mg weekly.  Please make sure to schedule an appointment to be seen by the pharmacy team so that you can get the continuous monitoring glucose machine.   If you have any questions or concerns, please do not hesitate to contact us via phone or MyChart message.   Jerre Simon, MD [facility]

## 2023-02-19 NOTE — Assessment & Plan Note (Signed)
>>  ASSESSMENT AND PLAN FOR TYPE 2 DIABETES MELLITUS WITHOUT COMPLICATION, WITHOUT LONG-TERM CURRENT USE OF INSULIN  (HCC) WRITTEN ON 02/19/2023  9:18 AM BY Goble Last, MD  Poorly controlled diabetes with most recent A1c of 11.5 from 6 months ago.  Suspect possible noncompliance as patient is unsure of how she is supposed to take her NovoLog .  However still taking her Lantus  14U daily and Ozempic  weekly.  She is currently not checking her home blood glucose readings mostly due to pain with pricking her finger.  Patient will most likely benefit from CGM which was discussed with patient and she is open to starting CGM. Attempted to get microalbuminuria/creatinine, however patient is unable to void.  Will most likely be done at next diabetes follow-up in 3 months. -Refilled patient's Lantus , NovoLog  -Increased patient's Ozempic  to 1 mg weekly -Obtained lab A1c, lipid panel, BMP -Advised patient to schedule a pharmacy appointment for CGM -Discussed diet and exercise

## 2023-02-19 NOTE — Assessment & Plan Note (Addendum)
Poorly controlled diabetes with most recent A1c of 11.5 from 6 months ago.  Suspect possible noncompliance as patient is unsure of how she is supposed to take her NovoLog.  However still taking her Lantus 14U daily and Ozempic weekly.  She is currently not checking her home blood glucose readings mostly due to pain with pricking her finger.  Patient will most likely benefit from CGM which was discussed with patient and she is open to starting CGM. Attempted to get microalbuminuria/creatinine, however patient is unable to void.  Will most likely be done at next diabetes follow-up in 3 months. -Refilled patient's Lantus, NovoLog -Increased patient's Ozempic to 1 mg weekly -Obtained lab A1c, lipid panel, BMP -Advised patient to schedule a pharmacy appointment for CGM -Discussed diet and exercise

## 2023-02-20 LAB — HCV INTERPRETATION

## 2023-02-20 LAB — BASIC METABOLIC PANEL
BUN/Creatinine Ratio: 10 (ref 9–23)
BUN: 7 mg/dL (ref 6–20)
CO2: 20 mmol/L (ref 20–29)
Calcium: 9.6 mg/dL (ref 8.7–10.2)
Chloride: 98 mmol/L (ref 96–106)
Creatinine, Ser: 0.69 mg/dL (ref 0.57–1.00)
Glucose: 348 mg/dL — ABNORMAL HIGH (ref 70–99)
Potassium: 4.7 mmol/L (ref 3.5–5.2)
Sodium: 135 mmol/L (ref 134–144)
eGFR: 116 mL/min/{1.73_m2} (ref >59)

## 2023-02-20 LAB — HCV AB W REFLEX TO QUANT PCR: HCV Ab: NONREACTIVE

## 2023-02-20 LAB — LIPID PANEL
Chol/HDL Ratio: 3.3 {ratio} (ref 0.0–4.4)
Cholesterol, Total: 163 mg/dL (ref 100–199)
HDL: 50 mg/dL (ref >39)
LDL Chol Calc (NIH): 98 mg/dL (ref 0–99)
Triglycerides: 79 mg/dL (ref 0–149)
VLDL Cholesterol Cal: 15 mg/dL (ref 5–40)

## 2023-02-20 MED ORDER — HYDROCODONE-ACETAMINOPHEN 5-325 MG PO TABS
1.0000 | ORAL_TABLET | Freq: Once | ORAL | Status: AC
  Administered 2023-02-20: 1 via ORAL
  Filled 2023-02-20: qty 1

## 2023-02-20 MED ORDER — DOXYCYCLINE HYCLATE 100 MG PO TABS
100.0000 mg | ORAL_TABLET | Freq: Once | ORAL | Status: AC
  Administered 2023-02-20: 100 mg via ORAL
  Filled 2023-02-20: qty 1

## 2023-02-20 MED ORDER — LIDOCAINE-EPINEPHRINE (PF) 2 %-1:200000 IJ SOLN
10.0000 mL | Freq: Once | INTRAMUSCULAR | Status: AC
  Administered 2023-02-20: 10 mL
  Filled 2023-02-20: qty 20

## 2023-02-20 MED ORDER — DOXYCYCLINE HYCLATE 100 MG PO CAPS: 100.0000 mg | ORAL_CAPSULE | Freq: Two times a day (BID) | ORAL | 0 refills | Status: DC

## 2023-02-20 NOTE — ED Provider Notes (Signed)
[facility] Provider Note   CSN: [id number] Arrival date & time: 02/19/23  2001     History  Chief Complaint  Patient presents with   Cyst    [name] [name] is a 37 y.o. female.  37 year old female with history of insulin dependent diabetes presents with concern for abscess to mons pubis area, onset today. Patient went to her PCP for routine visit but this was not addressed as this was not a sick visit. States she has had similar in the past, treated with I&D. Patient has been applying warm compress without relief. No fevers. No other complaints/concerns.        Home Medications Prior to Admission medications   Medication Sig Start Date End Date Taking? Authorizing Provider  doxycycline (VIBRAMYCIN) 100 MG capsule Take 1 capsule (100 mg total) by mouth 2 (two) times daily. 02/20/23  Yes Jeannie Fend, PA-C  Accu-Chek Softclix Lancets lancets Use as directed to check blood glucose TID 07/16/22   Erick Alley, DO  Blood Glucose Monitoring Suppl (ACCU-CHEK GUIDE) w/Device KIT Use as directed to check blood glucose TID 08/05/22   McDiarmid, Leighton Roach, MD  diphenhydrAMINE (BENADRYL) 25 mg capsule Take 1 capsule (25 mg total) by mouth every 6 (six) hours for 11 doses. 10/12/22 10/15/22  Theadora Rama Scales, PA-C  EPINEPHrine (EPIPEN 2-PAK) 0.3 mg/0.3 mL IJ SOAJ injection Inject one pin at onset of throat tightening or shortness of breath.  CALL 911.  Repeat injection with second pen after 5 minutes if no improvement after first injection. 10/12/22   Theadora Rama Scales, PA-C  glucose blood (ACCU-CHEK GUIDE) test strip Use as directed to check blood glucose TID 07/16/22   Erick Alley, DO  ibuprofen (ADVIL) 600 MG tablet Take 1 tablet (600 mg total) by mouth every 6 (six) hours as needed. 12/08/22   LampteyBritta Mccreedy, MD  insulin aspart (NOVOLOG) 100 UNIT/ML FlexPen CBG < 70: Implement Hypoglycemia measures CBG 70 - 120: 0 units CBG 121 - 150: 3  units CBG 151 - 200: 4 units CBG 201 - 250: 7 units CBG 251 - 300: 11 units CBG 301 - 350: 15 units CBG 351 - 400: 20 units CBG > 400: call MD 02/19/23   Jerre Simon, MD  insulin glargine (LANTUS) 100 UNIT/ML Solostar Pen Inject 14 Units into the skin every morning. 02/19/23   Jerre Simon, MD  Insulin Pen Needle (PEN NEEDLES) 32G X 4 MM MISC Use to inject insulin once daily 05/22/21   Billey Co, MD  methocarbamol (ROBAXIN) 500 MG tablet Take 1 tablet (500 mg total) by mouth at bedtime as needed for muscle spasms. 12/08/22   LampteyBritta Mccreedy, MD  Semaglutide, 1 MG/DOSE, 4 MG/3ML SOPN Inject 1 mg into the skin once a week. 02/19/23   Jerre Simon, MD      Allergies    Bee venom and Metformin and related    Review of Systems   Review of Systems Negative except as per HPI Physical Exam Updated Vital Signs BP 131/89   Pulse 98   Temp 98.4 F (36.9 C) (Oral)   Resp 18   Ht 5\' 1"  (1.549 m)   Wt 92.8 kg   SpO2 100%   BMI 38.66 kg/m  Physical Exam Vitals and nursing note reviewed. Exam conducted with a chaperone present.  Constitutional:      General: She is not in acute distress.    Appearance: She is  well-developed. She is not diaphoretic.  HENT:     Head: Normocephalic and atraumatic.  Pulmonary:     Effort: Pulmonary effort is normal.  Abdominal:    Skin:    General: Skin is warm and dry.     Findings: Erythema present.  Neurological:     Mental Status: She is alert and oriented to person, place, and time.  Psychiatric:        Behavior: Behavior normal.     ED Results / Procedures / Treatments   Labs (all labs ordered are listed, but only abnormal results are displayed) Labs Reviewed - No data to display  EKG None  Radiology No results found.  Procedures .Marland KitchenIncision and Drainage  Date/Time: 02/20/2023 2:42 AM  Performed by: Jeannie Fend, PA-C Authorized by: Jeannie Fend, PA-C   Consent:    Consent obtained:  Verbal   Consent given by:   Patient   Risks discussed:  Bleeding, incomplete drainage, pain and damage to other organs   Alternatives discussed:  No treatment Universal protocol:    Procedure explained and questions answered to patient or proxy's satisfaction: yes     Relevant documents present and verified: yes     Test results available : yes     Imaging studies available: yes     Required blood products, implants, devices, and special equipment available: yes     Site/side marked: yes     Immediately prior to procedure, a time out was called: yes     Patient identity confirmed:  Verbally with patient Location:    Type:  Abscess   Size:  1cm x 1cm   Location:  Anogenital   Anogenital location: mons pubis. Pre-procedure details:    Skin preparation:  Chlorhexidine with alcohol Sedation:    Sedation type:  None Anesthesia:    Anesthesia method:  Local infiltration   Local anesthetic:  Lidocaine 2% WITH epi Procedure type:    Complexity:  Simple Procedure details:    Ultrasound guidance: no     Needle aspiration: no     Incision types:  Single straight   Incision depth:  Subcutaneous   Wound management:  Probed and deloculated   Drainage:  Purulent   Drainage amount:  Scant   Packing materials:  None Post-procedure details:    Procedure completion:  Tolerated well, no immediate complications     Medications Ordered in ED Medications  lidocaine-EPINEPHrine (XYLOCAINE W/EPI) 2 %-1:200000 (PF) injection 10 mL (has no administration in time range)  doxycycline (VIBRA-TABS) tablet 100 mg (100 mg Oral Given 02/20/23 0230)  HYDROcodone-acetaminophen (NORCO/VICODIN) 5-325 MG per tablet 1 tablet (1 tablet Oral Given 02/20/23 0230)    ED Course/ Medical Decision Making/ A&P                                 Medical Decision Making Risk Prescription drug management.   37 year old female with hx of dm, seen in clinic today, prior A1C of 11. Here with pustule to mons pubis with surrounding erythema and  induration with purulent drainage with gentle manipulation of the area. I&D with scant purulent drainage noted. Area not packed due to location. Recommend continue warm compresses. Started on doxy with 1 norco in the ER, continue doxy. Recheck with PCP in 2 days, return to ER for fevers or worsening or concerning symptoms.         Final Clinical Impression(s) /  ED Diagnoses Final diagnoses:  Abscess    Rx / DC Orders ED Discharge Orders          Ordered    doxycycline (VIBRAMYCIN) 100 MG capsule  2 times daily        02/20/23 0226              Jeannie Fend, PA-C 02/20/23 0244    Wilkie Aye Mayer Masker, MD 02/20/23 [phone]

## 2023-02-20 NOTE — Discharge Instructions (Signed)
Warm compress to area for 20 minutes at a time. Take antibiotics as prescribed and complete the full course. Recheck with your doctor in 2 days. Return to the ER for fevers, worsening or concerning symptoms.

## 2023-02-24 ENCOUNTER — Encounter: Payer: Self-pay | Admitting: Pharmacist

## 2023-02-24 ENCOUNTER — Ambulatory Visit: Payer: Medicaid Other | Admitting: Pharmacist

## 2023-02-24 VITALS — BP 115/90 | HR 90 | Ht 61.0 in | Wt 207.2 lb

## 2023-02-24 DIAGNOSIS — Z7985 Long-term (current) use of injectable non-insulin antidiabetic drugs: Secondary | ICD-10-CM

## 2023-02-24 DIAGNOSIS — E119 Type 2 diabetes mellitus without complications: Secondary | ICD-10-CM

## 2023-02-24 MED ORDER — FREESTYLE LIBRE 3 SENSOR MISC: 11 refills | Status: DC

## 2023-02-24 NOTE — Patient Instructions (Addendum)
Continue all medications the same  Sensor Application If using the App, you can tap Help in the Main Menu to access an in-app tutorial on applying a Sensor. See below for instructions on how to download the app. Apply Sensors only on the back of your upper arm. If placed in other areas, the Sensor may not function properly and could give you inaccurate readings. Avoid areas with scars, moles, stretch marks, or lumps.   Select an area of skin that generally stays flat during your normal daily activities (no bending or folding). Choose a site that is at least 1 inch (2.5 cm) away from any injection sites. To prevent discomfort or skin irritation, you should select a different site other than the one most recently used. Wash application site using a plain soap, dry, and then clean with an alcohol wipe. This will help remove any oily residue that may prevent the sensor from sticking properly. Allow site to air dry before proceeding. Note: The area MUST be clean and dry, or the Sensor may not stay on for the full wear duration specified by your Sensor insert. 4. Unscrew the cap from the Sensor Applicator and set the cap aside.  5. Place the Sensor Applicator over the prepared site and push down firmly to apply the Sensor to your body. 6. Gently pull the Sensor Applicator away from your body. The Sensor should now be attached to your skin. 7. Make sure the Sensor is secure after application. Put the cap back on the Sensor Applicator. Discard the used Engineer, agricultural according to local regulations.  What If My Sensor Falls Off or What If My Sensor Isn't Working? Call [employer] Customer Care Team at [phone] Available 7 days a week from 8AM-8PM EST, excluding holidays If yo have multiple sensors fall off prior to 14 days of use, contact [facility] at [phone]   The App Download the FreeStyle [place] 3 App in your phone's app store   Load the app and select get started  now Create an account  Tap scan new sensor Follow the prompts on the screen. If your sensor does not sync, try moving your phone slowly around the sensor. Phone cases may affect scanning. This will be the only time you have to scan the sensor until you apply a new sensor in 14 days.  There will be a 60 minute start up period until the app will display your glucose reading   How To Share Your Readings With [place] Once in the app, go to settings -> connected apps -> LibreView -> Enter Practice ID -> [id number]

## 2023-02-24 NOTE — Assessment & Plan Note (Signed)
>>  ASSESSMENT AND PLAN FOR TYPE 2 DIABETES MELLITUS WITHOUT COMPLICATION, WITHOUT LONG-TERM CURRENT USE OF INSULIN  (HCC) WRITTEN ON 02/24/2023  2:57 PM BY KOVAL, PETER G, RPH-CPP  Diabetes longstanding currently uncontrolled. Patient is able to verbalize appropriate hypoglycemia management plan. Medication adherence appears good. Control is suboptimal due to A1c - 12. -Patient educated on purpose, proper use, and potential adverse effects of Freestyle Libre 3 Sensors.  -Extensively discussed pathophysiology of diabetes, recommended lifestyle interventions, dietary effects on blood sugar control.  -Counseled on s/sx of and management of hypoglycemia.   -Provided education on Libre 3 CGM. Collaborated to ensure Jerrilyn Moras 3 app was downloaded on patient's phone. Educated on how to place sensor every 14 days, patient placed first sensor correctly and verbalized understanding of use, removal, and how to place next sensor. Discussed alarms. Single sensor provide  Educated to contact the office if the sensor falls off early and replacements are needed.

## 2023-02-24 NOTE — Progress Notes (Signed)
S:     Chief Complaint  Patient presents with   Medication Management    CGM Education    37 y.o. female who presents for diabetes evaluation, education, and management in the context of the CGM majority of time was spent on education and set up of CGM. A1c elevated due to lack of availability of insulin recently obtained new supply.    PMH is significant for T2DM.  Patient was referred and last seen by Primary Care Provider, Dr. Elliot Gurney, on 02/19/2023.   At last visit, patient was able to gain access to her insulin supply.   Today, patient arrives in good spirits and presents without any assistance. Patient reports Diabetes was diagnosed in 2020.   Family/Social History: Father has T2DM and on dialysis  Current diabetes medications include: Lantus, Novolog, Semaglutide Patient reports adherence to taking all medications as prescribed.   Do you feel that your medications are working for you? yes Have you been experiencing any side effects to the medications prescribed? no Do you have any problems obtaining medications due to transportation or finances? no Insurance coverage: [place] medicaid healthy blue  Patient denies hypoglycemic events.   Patient denies nocturia (nighttime urination).  Patient denies neuropathy (nerve pain). Patient denies visual changes. Patient denies self foot exams.   Within the past 12 months, did you worry whether your food would run out before you got money to buy more? no Within the past 12 months, did the food you bought run out, and you didn't have money to get more? no  Patient-reported exercise habits: Stands all day for work.    O:   Review of Systems  All other systems reviewed and are negative.   Physical Exam Constitutional:      Appearance: Normal appearance.  Neurological:     Mental Status: She is alert.  Psychiatric:        Mood and Affect: Mood normal.        Behavior: Behavior normal.        Thought Content: Thought  content normal.        Judgment: Judgment normal.      Lab Results  Component Value Date   HGBA1C 12.0 (A) 02/19/2023     Vitals:   02/24/23 1342  BP: (!) 115/90  Pulse: 90  SpO2: 100%     Lipid Panel     Component Value Date/Time   CHOL 163 02/19/2023 1221   TRIG 79 02/19/2023 1221   HDL 50 02/19/2023 1221   CHOLHDL 3.3 02/19/2023 1221   CHOLHDL 3.4 08/02/2013 1518   VLDL 20 08/02/2013 1518   LDLCALC 98 02/19/2023 1221   LDLDIRECT 98 07/01/2012 0954    Patient is participating in a Managed Medicaid Plan:  Yes    A/P:  Diabetes longstanding currently uncontrolled. Patient is able to verbalize appropriate hypoglycemia management plan. Medication adherence appears good. Control is suboptimal due to A1c - 12. -Patient educated on purpose, proper use, and potential adverse effects of Freestyle Libre 3 Sensors.  -Extensively discussed pathophysiology of diabetes, recommended lifestyle interventions, dietary effects on blood sugar control.  -Counseled on s/sx of and management of hypoglycemia.   -Provided education on Libre 3 CGM. Collaborated to ensure Josephine Igo 3 app was downloaded on patient's phone. Educated on how to place sensor every 14 days, patient placed first sensor correctly and verbalized understanding of use, removal, and how to place next sensor. Discussed alarms. Single sensor provide  Educated to contact the office  if the sensor falls off early and replacements are needed.  Written patient instructions provided. Patient verbalized understanding of treatment plan.  Total time in face to face counseling 33 minutes.    Follow-up:  Pharmacist Dr. Raymondo Band, PharmD on 03/20/2023. Patient seen with Caprice Beaver, PharmD Candidate and Shona Simpson, PharmD Candidate.

## 2023-02-24 NOTE — Assessment & Plan Note (Signed)
Diabetes longstanding currently uncontrolled. Patient is able to verbalize appropriate hypoglycemia management plan. Medication adherence appears good. Control is suboptimal due to A1c - 12. -Patient educated on purpose, proper use, and potential adverse effects of Freestyle Libre 3 Sensors.  -Extensively discussed pathophysiology of diabetes, recommended lifestyle interventions, dietary effects on blood sugar control.  -Counseled on s/sx of and management of hypoglycemia.   -Provided education on Libre 3 CGM. Collaborated to ensure Josephine Igo 3 app was downloaded on patient's phone. Educated on how to place sensor every 14 days, patient placed first sensor correctly and verbalized understanding of use, removal, and how to place next sensor. Discussed alarms. Single sensor provide  Educated to contact the office if the sensor falls off early and replacements are needed.

## 2023-02-26 NOTE — Progress Notes (Signed)
Reviewed and agree with Dr Koval's plan.   

## 2023-03-05 ENCOUNTER — Telehealth: Payer: Self-pay

## 2023-03-05 NOTE — Telephone Encounter (Signed)
A Prior Authorization was submitted and approved for this patients FREESTYLE LIBRE 3 SENOSR through CoverMyMeds.   Key: [id number]

## 2023-03-06 NOTE — Telephone Encounter (Signed)
Pharmacy Patient Advocate Encounter  Received notification from [facility] that Prior Authorization for FREESTYLE LIBRE 3 SENSORS has been APPROVED from 03/05/23 to 09/01/23

## 2023-03-20 ENCOUNTER — Ambulatory Visit: Payer: Medicaid Other | Admitting: Pharmacist

## 2023-03-24 ENCOUNTER — Telehealth: Payer: Self-pay | Admitting: *Deleted

## 2023-03-24 NOTE — Telephone Encounter (Signed)
Transition Care Management Unsuccessful Follow-up Telephone Call  Date of discharge and from where:  [facility]  02/20/2023  Attempts:  1st Attempt  Reason for unsuccessful TCM follow-up call:  Left voice message

## 2023-03-26 ENCOUNTER — Other Ambulatory Visit: Payer: Self-pay | Admitting: Student

## 2023-03-26 DIAGNOSIS — E119 Type 2 diabetes mellitus without complications: Secondary | ICD-10-CM

## 2023-03-26 MED ORDER — FREESTYLE LIBRE 3 PLUS SENSOR MISC: 3 refills | Status: DC

## 2023-03-26 NOTE — Progress Notes (Signed)
Order placed for freestyle libre 3 plus as [name] [name] 3 is no longer available.

## 2023-07-20 ENCOUNTER — Other Ambulatory Visit: Payer: Self-pay | Admitting: Student

## 2023-07-20 ENCOUNTER — Telehealth

## 2023-07-20 DIAGNOSIS — E119 Type 2 diabetes mellitus without complications: Secondary | ICD-10-CM

## 2023-07-21 ENCOUNTER — Encounter: Payer: Self-pay | Admitting: Student

## 2023-07-21 ENCOUNTER — Ambulatory Visit (INDEPENDENT_AMBULATORY_CARE_PROVIDER_SITE_OTHER): Admitting: Student

## 2023-07-21 VITALS — BP 148/104 | HR 98 | Ht 61.5 in | Wt 204.0 lb

## 2023-07-21 DIAGNOSIS — E1140 Type 2 diabetes mellitus with diabetic neuropathy, unspecified: Secondary | ICD-10-CM | POA: Diagnosis not present

## 2023-07-21 DIAGNOSIS — Z794 Long term (current) use of insulin: Secondary | ICD-10-CM

## 2023-07-21 DIAGNOSIS — E1159 Type 2 diabetes mellitus with other circulatory complications: Secondary | ICD-10-CM | POA: Diagnosis not present

## 2023-07-21 DIAGNOSIS — I152 Hypertension secondary to endocrine disorders: Secondary | ICD-10-CM | POA: Diagnosis not present

## 2023-07-21 DIAGNOSIS — E119 Type 2 diabetes mellitus without complications: Secondary | ICD-10-CM | POA: Diagnosis present

## 2023-07-21 DIAGNOSIS — B3731 Acute candidiasis of vulva and vagina: Secondary | ICD-10-CM

## 2023-07-21 LAB — POCT UA - MICROSCOPIC ONLY: RBC, Urine, Miroscopic: NONE SEEN (ref 0–2)

## 2023-07-21 LAB — POCT URINALYSIS DIP (MANUAL ENTRY)
Bilirubin, UA: NEGATIVE
Blood, UA: NEGATIVE
Glucose, UA: >=1000 mg/dL — AB
Ketones, POC UA: NEGATIVE mg/dL
Nitrite, UA: NEGATIVE
Spec Grav, UA: 1.01 (ref 1.010–1.025)
Urobilinogen, UA: 0.2 U/dL
pH, UA: 6.5 (ref 5.0–8.0)

## 2023-07-21 LAB — POCT GLYCOSYLATED HEMOGLOBIN (HGB A1C): HbA1c, POC (controlled diabetic range): 12.3 % — AB (ref 0.0–7.0)

## 2023-07-21 MED ORDER — LOSARTAN POTASSIUM 25 MG PO TABS: 25.0000 mg | ORAL_TABLET | Freq: Every day | ORAL | 3 refills | Status: AC

## 2023-07-21 MED ORDER — COMFORT TOUCH BP CUFF/MEDIUM MISC: 1.0000 | Freq: Every day | 0 refills | Status: AC

## 2023-07-21 MED ORDER — FREESTYLE LIBRE 3 PLUS SENSOR MISC: 3 refills | Status: DC

## 2023-07-21 MED ORDER — INSULIN GLARGINE 100 UNIT/ML SOLOSTAR PEN: 18.0000 [IU] | PEN_INJECTOR | SUBCUTANEOUS | 11 refills | Status: DC

## 2023-07-21 MED ORDER — INSULIN ASPART 100 UNIT/ML FLEXPEN: PEN_INJECTOR | SUBCUTANEOUS | 0 refills | Status: DC

## 2023-07-21 MED ORDER — FLUCONAZOLE 150 MG PO TABS: 150.0000 mg | ORAL_TABLET | Freq: Once | ORAL | 0 refills | Status: AC

## 2023-07-21 MED ORDER — INSULIN GLARGINE 100 UNIT/ML SOLOSTAR PEN: 16.0000 [IU] | PEN_INJECTOR | SUBCUTANEOUS | 11 refills | Status: DC

## 2023-07-21 MED ORDER — INSULIN GLARGINE 100 UNIT/ML SOLOSTAR PEN: 14.0000 [IU] | PEN_INJECTOR | SUBCUTANEOUS | 11 refills | Status: DC

## 2023-07-21 MED ORDER — SEMAGLUTIDE (1 MG/DOSE) 4 MG/3ML ~~LOC~~ SOPN: 1.0000 mg | PEN_INJECTOR | SUBCUTANEOUS | 3 refills | Status: DC

## 2023-07-21 NOTE — Assessment & Plan Note (Addendum)
 BP elevated x 2 today.  Currently not on any BP medication and asymptomatic. -Restarted patient's losartan 25 mg daily -Sent in prescription for blood pressure cuff -Encouraged daily blood pressure checks and BP logs -Follow-up with PCP to titrate blood pressure medications.

## 2023-07-21 NOTE — Progress Notes (Signed)
    SUBJECTIVE:   CHIEF COMPLAINT / HPI:   38 year old female history of type 2 diabetes and hypertension presenting today for occasional refills.  Patient reports she has been out of her diabetes medications for the past week.  Current medication include Lantus 14 units daily, sliding scale NovoLog and weekly semaglutide.  She reports she has been out of all her medications.  And have noticed increased thirst and increased urination.  Reports some occasional twitching of her fourth right toe.  A1c 5 months ago was 12.0  PERTINENT  PMH / PSH: Reviewed  OBJECTIVE:   BP (!) 148/104   Pulse 98   Ht 5' 1.5" (1.562 m)   Wt 204 lb (92.5 kg)   SpO2 99%   BMI 37.92 kg/m    Physical Exam General: Alert, well appearing, NAD Cardiovascular: RRR, No Murmurs, Normal S2/S2 Respiratory: CTAB, No wheezing or Rales Abdomen: No distension or tenderness Extremities: No edema on extremities    ASSESSMENT/PLAN:   Type 2 diabetes mellitus without complication, without long-term current use of insulin (HCC) Poorly controlled with increased A1c of 12.3% today.  Patient is also endorses hyperglycemic symptoms of polyuria and polydipsia. Would likely need migration of headache medication. -Increased Lantus to 18 units daily -Continue home NovoLog and weekly semaglutide -Refilled patient's medications and libre freestyle CGM vies -Recommend follow-up with Dr. Raymondo Band for diabetes in 2 weeks to help with CGM.  Hypertension associated with diabetes (HCC) BP elevated x 2 today.  Currently not on any medication and asymptomatic. -Restarted patient's losartan 25 mg daily -Sent in prescription for blood pressure cuff -Encouraged daily blood pressure checks and BP logs -Follow-up with PCP to titrate blood pressure medications.      Jerre Simon, MD [facility]

## 2023-07-21 NOTE — Patient Instructions (Addendum)
 Pleasure to meet you today.  Your A1c increased to 12.3%.  I have refilled your diabetes medication you should be able to pick them up today including your libre sensor for continuous glucose monitoring.  Also your blood pressure today was elevated I have restarted your home blood pressure medication.  Given your elevated blood pressure today I have started your losartan 25 mg daily.  Please follow-up with our pharmacy team in 2 weeks to check your blood sugar and blood pressure.

## 2023-07-21 NOTE — Assessment & Plan Note (Addendum)
 Poorly controlled with increased A1c of 12.3% today.  Patient is also endorses hyperglycemic symptoms of polyuria and polydipsia. Would likely need migration of headache medication. -Increased Lantus to 18 units daily -Continue home NovoLog and weekly semaglutide -Refilled patient's medications and libre freestyle CGM vies -Recommend follow-up with Dr. Raymondo Band for diabetes in 2 weeks to help with CGM.

## 2023-07-21 NOTE — Assessment & Plan Note (Signed)
>>  ASSESSMENT AND PLAN FOR TYPE 2 DIABETES MELLITUS WITHOUT COMPLICATION, WITHOUT LONG-TERM CURRENT USE OF INSULIN  (HCC) WRITTEN ON 07/21/2023  5:24 PM BY Goble Last, MD  Poorly controlled with increased A1c of 12.3% today.  Patient is also endorses hyperglycemic symptoms of polyuria and polydipsia. Would likely need migration of headache medication. -Increased Lantus  to 18 units daily -Continue home NovoLog  and weekly semaglutide  -Refilled patient's medications and libre freestyle CGM vies -Recommend follow-up with Dr. Koval for diabetes in 2 weeks to help with CGM.

## 2023-07-23 ENCOUNTER — Encounter: Payer: Self-pay | Admitting: Student

## 2023-07-28 ENCOUNTER — Other Ambulatory Visit: Payer: Self-pay | Admitting: Student

## 2023-07-28 DIAGNOSIS — N390 Urinary tract infection, site not specified: Secondary | ICD-10-CM

## 2023-07-28 MED ORDER — CEPHALEXIN 500 MG PO CAPS
500.0000 mg | ORAL_CAPSULE | Freq: Three times a day (TID) | ORAL | 0 refills | Status: AC
Start: 2023-07-28 — End: 2023-08-02

## 2023-07-28 NOTE — Progress Notes (Signed)
Prescribed Keflex for UTI

## 2023-08-04 ENCOUNTER — Telehealth: Payer: Self-pay | Admitting: Pharmacist

## 2023-08-04 ENCOUNTER — Ambulatory Visit: Admitting: Pharmacist

## 2023-08-04 NOTE — Telephone Encounter (Signed)
 Attempted to contact patient for follow-up of missed appointment today.   Left HIPAA compliant voice mail requesting call back to direct phone: [phone]  OR  [phone]  Total time with patient call and documentation of interaction: 4 minutes.

## 2023-08-05 NOTE — Telephone Encounter (Signed)
 Reviewed and agree with Dr Macky Lower plan.

## 2023-08-07 ENCOUNTER — Other Ambulatory Visit (HOSPITAL_COMMUNITY): Payer: Self-pay

## 2023-08-14 ENCOUNTER — Other Ambulatory Visit: Payer: Self-pay | Admitting: Student

## 2023-08-14 DIAGNOSIS — E119 Type 2 diabetes mellitus without complications: Secondary | ICD-10-CM

## 2023-08-14 DIAGNOSIS — E1140 Type 2 diabetes mellitus with diabetic neuropathy, unspecified: Secondary | ICD-10-CM

## 2023-08-14 MED ORDER — INSULIN ASPART 100 UNIT/ML FLEXPEN: PEN_INJECTOR | SUBCUTANEOUS | 0 refills | Status: DC

## 2023-08-14 MED ORDER — INSULIN GLARGINE 100 UNIT/ML SOLOSTAR PEN: 18.0000 [IU] | PEN_INJECTOR | SUBCUTANEOUS | 0 refills | Status: DC

## 2023-09-03 ENCOUNTER — Other Ambulatory Visit: Payer: Self-pay | Admitting: Student

## 2023-09-03 DIAGNOSIS — E1140 Type 2 diabetes mellitus with diabetic neuropathy, unspecified: Secondary | ICD-10-CM

## 2023-09-03 DIAGNOSIS — E119 Type 2 diabetes mellitus without complications: Secondary | ICD-10-CM

## 2023-09-03 MED ORDER — INSULIN ASPART 100 UNIT/ML FLEXPEN: PEN_INJECTOR | SUBCUTANEOUS | 1 refills | Status: DC

## 2023-09-03 MED ORDER — INSULIN GLARGINE 100 UNIT/ML SOLOSTAR PEN: 18.0000 [IU] | PEN_INJECTOR | SUBCUTANEOUS | 1 refills | Status: DC

## 2023-09-24 ENCOUNTER — Ambulatory Visit (INDEPENDENT_AMBULATORY_CARE_PROVIDER_SITE_OTHER): Payer: Self-pay

## 2023-09-24 ENCOUNTER — Ambulatory Visit (HOSPITAL_COMMUNITY)
Admission: EM | Admit: 2023-09-24 | Discharge: 2023-09-24 | Disposition: A | Discharge status: Home / Self Care | Payer: Self-pay | Attending: Emergency Medicine | Admitting: Emergency Medicine

## 2023-09-24 ENCOUNTER — Encounter (HOSPITAL_COMMUNITY): Payer: Self-pay

## 2023-09-24 DIAGNOSIS — M25511 Pain in right shoulder: Secondary | ICD-10-CM

## 2023-09-24 MED ORDER — NAPROXEN 500 MG PO TABS: 500.0000 mg | ORAL_TABLET | Freq: Two times a day (BID) | ORAL | 0 refills | Status: DC

## 2023-09-24 NOTE — ED Triage Notes (Signed)
 Pt states that she has some right shoulder pain that makes her right arm feel numb. X1 day Pt states that she also has some numbness of her toes on the right foot. X 2-3 days

## 2023-09-24 NOTE — ED Provider Notes (Signed)
 [facility]    CSN: [id number] Arrival date & time: 09/24/23  1216      History   Chief Complaint Chief Complaint  Patient presents with   Shoulder Pain    HPI [name] [name] is a 38 y.o. female.   Patient presents with right shoulder pain that intermittently shoots a numbness sensation down her right arm that began this morning.  Patient denies any recent falls or known injury.  Patient states that she thinks she may have just slept on her arm wrong, but was concerned because the pain was continuing. Patient denies taking anything for pain.  The history is provided by the patient and medical records.  Shoulder Pain   Past Medical History:  Diagnosis Date   Abnormal Pap smear    Diabetes mellitus    type II   GBS (group B streptococcus) UTI complicating pregnancy    Herpes simplex without mention of complication    Hypertension    Irregular menses    Obesity     Patient Active Problem List   Diagnosis Date Noted   Sinusitis, maxillary, chronic 10/13/2020   Depression, recurrent (HCC) 03/21/2020   Hx of menorrhagia 03/15/2020   Type 2 diabetes mellitus with diabetic neuropathy, unspecified (HCC) 03/14/2020   Pneumonia due to COVID-19 virus 12/28/2019   Acute hypoxemic respiratory failure due to COVID-19 (HCC) 12/28/2019   UTI (urinary tract infection) 12/28/2019   Sepsis (HCC) 12/28/2019   DKA (diabetic ketoacidosis) (HCC) 12/28/2019   Anemia 04/15/2019   Abdominal pain 03/14/2019   Screening for malignant neoplasm of cervix 12/30/2018   Type 2 diabetes mellitus without complication, without long-term current use of insulin  (HCC) 12/30/2018   Chest pain 05/20/2017   History of tubal ligation    Medication refill 07/22/2016   Vaginal discharge 05/16/2016   Constipation 03/04/2016   Yeast infection 02/11/2016   Vagina, candidiasis 03/24/2014   Alopecia areata 09/29/2013   Hypertension associated with diabetes (HCC) 08/09/2009   Type II diabetes  mellitus, uncontrolled 09/20/2007   Herpes simplex virus (HSV) infection 10/23/2006   Obesity 07/09/2006   RHINITIS, ALLERGIC 07/09/2006   PAPANICOLAOU SMEAR, ABNORMAL 07/09/2006    Past Surgical History:  Procedure Laterality Date   CESAREAN SECTION  01/30/2011   Procedure: CESAREAN SECTION;  Surgeon: Albino Hum, MD;  Location: [facility];  Service: Gynecology;  Laterality: N/A;   CESAREAN SECTION N/A 10/22/2014   Procedure: CESAREAN SECTION;  Surgeon: Granville Layer, MD;  Location: [facility];  Service: Obstetrics;  Laterality: N/A;   TUBAL LIGATION     WISDOM TOOTH EXTRACTION      OB History     Gravida  4   Para  2   Term  1   Preterm  1   AB  2   Living  2      SAB  2   IAB      Ectopic      Multiple  0   Live Births  2            Home Medications    Prior to Admission medications   Medication Sig Start Date End Date Taking? Authorizing Provider  Accu-Chek Softclix Lancets lancets Use as directed to check blood glucose TID 07/16/22  Yes Glenn Lange, DO  Blood Glucose Monitoring Suppl (ACCU-CHEK GUIDE) w/Device KIT Use as directed to check blood glucose TID 08/05/22  Yes McDiarmid, Demetra Filter, MD  Blood Pressure Monitoring (COMFORT TOUCH BP CUFF/MEDIUM) MISC 1  each by Does not apply route daily. 07/21/23  Yes Goble Last, MD  Continuous Glucose Sensor (FREESTYLE LIBRE 3 PLUS SENSOR) MISC Change sensor every 15 days. 07/21/23  Yes Goble Last, MD  EPINEPHrine  (EPIPEN  2-PAK) 0.3 mg/0.3 mL IJ SOAJ injection Inject one pin at onset of throat tightening or shortness of breath.  CALL 911.  Repeat injection with second pen after 5 minutes if no improvement after first injection. 10/12/22  Yes Eloise Hake Scales, PA-C  glucose blood (ACCU-CHEK GUIDE) test strip Use as directed to check blood glucose TID 07/16/22  Yes Glenn Lange, DO  ibuprofen  (ADVIL ) 600 MG tablet Take 1 tablet (600 mg total) by mouth every 6 (six) hours as needed. 12/08/22  Yes Lamptey, Donley Furth, MD   insulin  aspart (NOVOLOG ) 100 UNIT/ML FlexPen CBG < 70: Implement Hypoglycemia measures CBG 70 - 120: 0 units CBG 121 - 150: 3 units CBG 151 - 200: 4 units CBG 201 - 250: 7 units CBG 251 - 300: 11 units CBG 301 - 350: 15 units CBG 351 - 400: 20 units CBG > 400: call MD 09/03/23  Yes Glenn Lange, DO  insulin  glargine (LANTUS ) 100 UNIT/ML Solostar Pen Inject 18 Units into the skin every morning. 09/03/23  Yes Glenn Lange, DO  Insulin  Pen Needle (PEN NEEDLES) 32G X 4 MM MISC Use to inject insulin  once daily 05/22/21  Yes Pray, Cain Castillo, MD  losartan  (COZAAR ) 25 MG tablet Take 1 tablet (25 mg total) by mouth at bedtime. 07/21/23  Yes Goble Last, MD  naproxen  (NAPROSYN ) 500 MG tablet Take 1 tablet (500 mg total) by mouth 2 (two) times daily. 09/24/23  Yes Savannaha Stonerock A, NP  Semaglutide , 1 MG/DOSE, 4 MG/3ML SOPN Inject 1 mg into the skin once a week. 07/21/23  Yes Goble Last, MD  diphenhydrAMINE  (BENADRYL ) 25 mg capsule Take 1 capsule (25 mg total) by mouth every 6 (six) hours for 11 doses. 10/12/22 10/15/22  Eloise Hake Scales, PA-C  doxycycline  (VIBRAMYCIN ) 100 MG capsule Take 1 capsule (100 mg total) by mouth 2 (two) times daily. 02/20/23   Darlis Eisenmenger, PA-C    Family History Family History  Problem Relation Age of Onset   Diabetes Paternal Grandmother    Hypertension Paternal Grandmother    Diabetes Maternal Grandmother    Diabetes Father    Cancer Paternal Uncle    Breast cancer Maternal Aunt    Breast cancer Paternal Aunt     Social History Social History   Tobacco Use   Smoking status: Former   Smokeless tobacco: Never  Vaping Use   Vaping status: Never Used  Substance Use Topics   Alcohol use: Not Currently    Comment: "occassionally"   Drug use: No     Allergies   Bee venom and Metformin  and related   Review of Systems Review of Systems  Per HPI  Physical Exam Triage Vital Signs ED Triage Vitals  Encounter Vitals Group     BP 09/24/23 1339 (!) 136/90      Systolic BP Percentile --      Diastolic BP Percentile --      Pulse Rate 09/24/23 1339 90     Resp 09/24/23 1339 17     Temp 09/24/23 1339 98.3 F (36.8 C)     Temp Source 09/24/23 1339 Oral     SpO2 09/24/23 1339 99 %     Weight 09/24/23 1338 210 lb (95.3 kg)     Height 09/24/23 1338 5'  1" (1.549 m)     Head Circumference --      Peak Flow --      Pain Score 09/24/23 1338 6     Pain Loc --      Pain Education --      Exclude from Growth Chart --    No data found.  Updated Vital Signs BP (!) 136/90 (BP Location: Right Arm)   Pulse 90   Temp 98.3 F (36.8 C) (Oral)   Resp 17   Ht 5\' 1"  (1.549 m)   Wt 210 lb (95.3 kg)   LMP 08/25/2023   SpO2 99%   BMI 39.68 kg/m   Visual Acuity Right Eye Distance:   Left Eye Distance:   Bilateral Distance:    Right Eye Near:   Left Eye Near:    Bilateral Near:     Physical Exam Vitals and nursing note reviewed.  Constitutional:      General: She is awake. She is not in acute distress.    Appearance: Normal appearance. She is well-developed and well-groomed. She is not ill-appearing.  Musculoskeletal:     Right shoulder: Tenderness present. No swelling or deformity. Normal range of motion.     Comments: Tenderness noted to right upper trapezius muscle.  Without decreased range of motion, swelling, or deformity noted to shoulder.  Skin:    General: Skin is warm and dry.  Neurological:     Mental Status: She is alert.  Psychiatric:        Behavior: Behavior is cooperative.      UC Treatments / Results  Labs (all labs ordered are listed, but only abnormal results are displayed) Labs Reviewed - No data to display  EKG   Radiology DG Shoulder Right Result Date: 09/24/2023 CLINICAL DATA:  Shoulder pain.  Arm numbness for a day EXAM: RIGHT SHOULDER - 3 VIEW COMPARISON:  None Available. FINDINGS: No fracture or dislocation. Preserved joint spaces and bone mineralization. Artifact from the patient's clothing. IMPRESSION:  No acute osseous abnormality. Electronically Signed   By: Adrianna Horde M.D.   On: 09/24/2023 14:57    Procedures Procedures (including critical care time)  Medications Ordered in UC Medications - No data to display  Initial Impression / Assessment and Plan / UC Course  I have reviewed the triage vital signs and the nursing notes.  Pertinent labs & imaging results that were available during my care of the patient were reviewed by me and considered in my medical decision making (see chart for details).     Patient is well-appearing.  Vitals are stable.  Upon assessment tenderness noted to right upper trapezius muscle without decreased range of motion, swelling, or deformity noted.  X-ray ordered.  Based on my interpretation there are no obvious fractures or underlying injury noted on x-ray.  Radiology report confirms this.  Prescribe naproxen  as needed for pain.  Recommended alternating this with Tylenol  as needed.  Given orthopedic follow-up.  Discussed follow-up and return precautions. Final Clinical Impressions(s) / UC Diagnoses   Final diagnoses:  Acute pain of right shoulder     Discharge Instructions      Your x-ray is negative for any fracture or underlying injury. Take naproxen  twice daily as needed for pain.  Do not take this with other NSAIDs including ibuprofen , Motrin , Advil , Aleve , naproxen , and Goody powder. You can take 650 mg of Tylenol  every 6-8 hours as needed for breakthrough pain. Alternate between ice and heat and do some gentle stretching  to help with pain. Follow-up with [facility] if your pain continues. Follow-up with primary care doctor or return here as needed.   ED Prescriptions     Medication Sig Dispense Auth. Provider   naproxen  (NAPROSYN ) 500 MG tablet Take 1 tablet (500 mg total) by mouth 2 (two) times daily. 30 tablet Levora Reas A, NP      PDMP not reviewed this encounter.   Levora Reas A, NP 09/24/23  1510

## 2023-09-24 NOTE — Discharge Instructions (Addendum)
 Your x-ray is negative for any fracture or underlying injury. Take naproxen  twice daily as needed for pain.  Do not take this with other NSAIDs including ibuprofen , Motrin , Advil , Aleve , naproxen , and Goody powder. You can take 650 mg of Tylenol  every 6-8 hours as needed for breakthrough pain. Alternate between ice and heat and do some gentle stretching to help with pain. Follow-up with [facility] if your pain continues. Follow-up with primary care doctor or return here as needed.

## 2023-10-13 ENCOUNTER — Ambulatory Visit (INDEPENDENT_AMBULATORY_CARE_PROVIDER_SITE_OTHER): Payer: Self-pay | Admitting: Student

## 2023-10-13 VITALS — BP 149/83 | HR 87 | Ht 61.0 in | Wt 216.8 lb

## 2023-10-13 DIAGNOSIS — E1159 Type 2 diabetes mellitus with other circulatory complications: Secondary | ICD-10-CM

## 2023-10-13 DIAGNOSIS — Z794 Long term (current) use of insulin: Secondary | ICD-10-CM

## 2023-10-13 DIAGNOSIS — I152 Hypertension secondary to endocrine disorders: Secondary | ICD-10-CM

## 2023-10-13 DIAGNOSIS — E1140 Type 2 diabetes mellitus with diabetic neuropathy, unspecified: Secondary | ICD-10-CM

## 2023-10-13 DIAGNOSIS — Z23 Encounter for immunization: Secondary | ICD-10-CM

## 2023-10-13 MED ORDER — FREESTYLE LIBRE 3 PLUS SENSOR MISC: 3 refills | Status: AC

## 2023-10-13 MED ORDER — INSULIN GLARGINE 100 UNIT/ML SOLOSTAR PEN
18.0000 [IU] | PEN_INJECTOR | SUBCUTANEOUS | 1 refills | Status: AC
Start: 2023-10-13 — End: ?

## 2023-10-13 MED ORDER — INSULIN ASPART 100 UNIT/ML FLEXPEN: PEN_INJECTOR | SUBCUTANEOUS | 1 refills | Status: AC

## 2023-10-13 MED ORDER — TIRZEPATIDE 2.5 MG/0.5ML ~~LOC~~ SOAJ: 2.5000 mg | SUBCUTANEOUS | 0 refills | Status: AC

## 2023-10-13 NOTE — Progress Notes (Signed)
    SUBJECTIVE:   CHIEF COMPLAINT / HPI:   [name] [name] is a 38 year old female with type 2 diabetes who presents with issues related  diabetes management.  She discontinued Ozempic  due to stomach pain, constipation, nausea, and vomiting, especially with dose increases.  The symptoms resolved after she stopped Ozempic .  She is interested in trying a different GLP-1 for diabetes management and weight loss.  For diabetes management, she uses Novolog  on a sliding scale and Lantus  at 18 units daily. She cannot check fasting blood sugars due to lack of a glucose monitor.  She prefers a sensor connected to her phone to avoid finger pricks, she previously used the freestyle libre 3+.  Her last A1c was 12.3 on March 11.    She takes losartan  for high blood pressure at bedtime but missed a dose this morning.   PERTINENT  PMH / PSH: T2DM, HTN, obesity  OBJECTIVE:   BP (!) 149/83   Pulse 87   Ht 5\' 1"  (1.549 m)   Wt 216 lb 12.8 oz (98.3 kg)   LMP 08/25/2023   SpO2 99%   BMI 40.96 kg/m    General: NAD, pleasant, able to participate in exam Cardiac: RRR, no murmurs. Respiratory: CTAB, normal effort, No wheezes, rales or rhonchi Neuro: alert, no obvious focal deficits Psych: Normal affect and mood  ASSESSMENT/PLAN:   Type 2 diabetes mellitus with diabetic neuropathy, unspecified (HCC) Uncontrolled with previous A1c of 12.3 on 07/21/2023.  Patient prefers to wait and get A1c at next visit. Plan as in AVS: - Continue current insulin  regimen as in HPI, Lantus  and NovoLog  refilled -Tirzepatide has been sent to pharmacy to aid in weight loss and blood sugar control.  Start at the 2.5 mg dose once weekly.  If you tolerate this well after 4 weeks we will increase the dose to 5 mg weekly. - New prescription for freestyle libre 3+ sent to pharmacy - If you have issues getting the sensor, please let me know and I can send in a prescription for the manual finger prick glucose monitor -Referral  placed for ophthalmology for diabetic eye exam -Return in 2 weeks for A1c, foot exam and discuss blood sugars -Urine microalbumin creatinine ratio today to assess kidney function  Hypertension associated with diabetes (HCC) Did not take her losartan  25 mg this morning.  Uncontrolled, asymptomatic.  Has not had BMP since it was started. - BMP today - Return in 2 weeks for BP check, patient advised to take her medication before that appointment   Patient to return in 2 weeks for BP check, T2DM and if time allows, Pap smear.  Otherwise will need to schedule separate appointment for Pap smear.  Dr. Glenn Lange, DO [facility]

## 2023-10-13 NOTE — Assessment & Plan Note (Addendum)
 Did not take her losartan  25 mg this morning.  Uncontrolled, asymptomatic.  Has not had BMP since it was started. - BMP today - Return in 2 weeks for BP check, patient advised to take her medication before that appointment

## 2023-10-13 NOTE — Patient Instructions (Signed)
 It was great to see you! Thank you for allowing me to participate in your care!  I recommend that you always bring your medications to each appointment as this makes it easy to ensure you are on the correct medications and helps us  not miss when refills are needed.  Our plans for today:  - Continue current insulin  regimen -Tirzepatide has been sent to pharmacy to aid in weight loss and blood sugar control.  Start at the 2.5 mg dose once weekly.  If you tolerate this well after 4 weeks we will increase the dose to 5 mg weekly. - New prescription for freestyle libre 3+ sent to pharmacy - If you have issues getting the sensor, please let me know and I can send in a prescription for the manual finger prick glucose monitor - Continue your blood pressure medication, be sure to take it before your next visit - Return in about 2 weeks for blood pressure check and discuss blood sugars   We are checking some labs today, I will call you if they are abnormal will send you a MyChart message or a letter if they are normal.  If you do not hear about your labs in the next 2 weeks please let us  know.  Take care and seek immediate care sooner if you develop any concerns.   Dr. Glenn Lange, DO [facility]

## 2023-10-13 NOTE — Assessment & Plan Note (Signed)
 Uncontrolled with previous A1c of 12.3 on 07/21/2023.  Patient prefers to wait and get A1c at next visit. Plan as in AVS: - Continue current insulin  regimen as in HPI, Lantus  and NovoLog  refilled -Tirzepatide has been sent to pharmacy to aid in weight loss and blood sugar control.  Start at the 2.5 mg dose once weekly.  If you tolerate this well after 4 weeks we will increase the dose to 5 mg weekly. - New prescription for freestyle libre 3+ sent to pharmacy - If you have issues getting the sensor, please let me know and I can send in a prescription for the manual finger prick glucose monitor -Referral placed for ophthalmology for diabetic eye exam -Return in 2 weeks for A1c, foot exam and discuss blood sugars -Urine microalbumin creatinine ratio today to assess kidney function

## 2023-10-14 ENCOUNTER — Ambulatory Visit: Payer: Self-pay | Admitting: Student

## 2023-10-14 LAB — BASIC METABOLIC PANEL WITH GFR
BUN/Creatinine Ratio: 13 (ref 9–23)
BUN: 8 mg/dL (ref 6–20)
CO2: 20 mmol/L (ref 20–29)
Calcium: 9 mg/dL (ref 8.7–10.2)
Chloride: 98 mmol/L (ref 96–106)
Creatinine, Ser: 0.62 mg/dL (ref 0.57–1.00)
Glucose: 347 mg/dL — ABNORMAL HIGH (ref 70–99)
Potassium: 4.7 mmol/L (ref 3.5–5.2)
Sodium: 134 mmol/L (ref 134–144)
eGFR: 119 mL/min/{1.73_m2} (ref >59)

## 2023-10-14 LAB — MICROALBUMIN / CREATININE URINE RATIO
Creatinine, Urine: 30.8 mg/dL
Microalb/Creat Ratio: 395 mg/g{creat} — ABNORMAL HIGH (ref 0–29)
Microalbumin, Urine: 121.6 ug/mL

## 2023-10-20 ENCOUNTER — Encounter: Payer: Self-pay | Admitting: *Deleted

## 2023-12-08 ENCOUNTER — Encounter: Payer: Self-pay | Admitting: Family Medicine

## 2023-12-08 ENCOUNTER — Ambulatory Visit (INDEPENDENT_AMBULATORY_CARE_PROVIDER_SITE_OTHER): Payer: Self-pay | Admitting: Family Medicine

## 2023-12-08 VITALS — BP 118/80 | HR 85 | Wt 215.0 lb

## 2023-12-08 DIAGNOSIS — Z3009 Encounter for other general counseling and advice on contraception: Secondary | ICD-10-CM

## 2023-12-08 NOTE — Patient Instructions (Signed)
 It was wonderful to see you today!  Today we discussed removing your Nexplanon .  Since you already have your tubes tied we do not necessarily need to do any bridging therapy to prevent pregnancy but because you are desiring to have no further periods I think it is a good idea consider the following options:  Endometrial ablation Hysterectomy Continuous progesterone only birth control  I have placed a referral for you to see GYN to discuss the first 2 options.  After you have made your appointment to have your Nexplanon  removed we can discuss whether or not you would like to do progesterone only birth control to control your periods in the meantime.  Please stop at the front desk to make this appointment as I have already messaged them about the need for a longer appointment slot for you.  Please call [phone] with any questions about today's appointment.   If you need any additional refills, please call your pharmacy before calling the office.  Lucie Pinal, DO Family Medicine

## 2023-12-08 NOTE — Progress Notes (Signed)
    SUBJECTIVE:   CHIEF COMPLAINT / HPI:   Birth control concern. Per chart, patient previously had nexplanon .   Patient has had permanent birth control with bilateral tubal ligation and has been using birth control for management of menorrhagia with intolerable premenstrual symptoms such as nausea and abdominal pain.  Her current birth control method Nexplanon  has not provided relief from these symptoms.  In the past she has tried OCPs which she had limited success with due to difficulty in maintaining a regular schedule, IUD which was discontinued due to patient discomfort, and Depo-Provera which was discontinued due to weight gain.  Most recently her Nexplanon  has not controlled her bleeding, and she has had 2 months within the last 12 where she had two periods of bleeding rather than just 1.  PERTINENT  PMH / PSH: History of menorrhagia, history of anemia  OBJECTIVE:   Wt 215 lb (97.5 kg)   LMP 11/09/2023   BMI 40.62 kg/m   General: Well-appearing, no distress HEENT: Moist mucous membranes, noninjected sclerae, PERRLA EOMI  ASSESSMENT/PLAN:   Assessment & Plan Unwanted fertility - Discussed removal of Nexplanon , patient will schedule appropriate appointment -Referral to gynecology for evaluation of surgical interventions including ablation or hysterectomy. -Patient does not require interim birth control as she has had bilateral tubal ligation.   Lucie Pinal, DO [facility]

## 2023-12-18 ENCOUNTER — Emergency Department (HOSPITAL_BASED_OUTPATIENT_CLINIC_OR_DEPARTMENT_OTHER)
Admission: EM | Admit: 2023-12-18 | Discharge: 2023-12-18 | Disposition: A | Discharge status: Home / Self Care | Payer: Self-pay | Attending: Emergency Medicine | Admitting: Emergency Medicine

## 2023-12-18 ENCOUNTER — Emergency Department (HOSPITAL_BASED_OUTPATIENT_CLINIC_OR_DEPARTMENT_OTHER): Payer: Self-pay

## 2023-12-18 ENCOUNTER — Encounter (HOSPITAL_BASED_OUTPATIENT_CLINIC_OR_DEPARTMENT_OTHER): Payer: Self-pay

## 2023-12-18 ENCOUNTER — Other Ambulatory Visit: Payer: Self-pay

## 2023-12-18 DIAGNOSIS — G43909 Migraine, unspecified, not intractable, without status migrainosus: Secondary | ICD-10-CM

## 2023-12-18 DIAGNOSIS — R7309 Other abnormal glucose: Secondary | ICD-10-CM | POA: Insufficient documentation

## 2023-12-18 DIAGNOSIS — R112 Nausea with vomiting, unspecified: Secondary | ICD-10-CM | POA: Insufficient documentation

## 2023-12-18 DIAGNOSIS — R519 Headache, unspecified: Secondary | ICD-10-CM | POA: Insufficient documentation

## 2023-12-18 LAB — CBG MONITORING, ED: Glucose-Capillary: 223 mg/dL — ABNORMAL HIGH (ref 70–99)

## 2023-12-18 MED ORDER — KETOROLAC TROMETHAMINE 30 MG/ML IJ SOLN
15.0000 mg | Freq: Once | INTRAMUSCULAR | Status: AC
  Administered 2023-12-18: 15 mg via INTRAVENOUS
  Filled 2023-12-18: qty 1

## 2023-12-18 MED ORDER — METOCLOPRAMIDE HCL 5 MG/ML IJ SOLN
10.0000 mg | Freq: Once | INTRAMUSCULAR | Status: AC
  Administered 2023-12-18: 10 mg via INTRAVENOUS
  Filled 2023-12-18: qty 2

## 2023-12-18 MED ORDER — FENTANYL CITRATE PF 50 MCG/ML IJ SOSY
50.0000 ug | PREFILLED_SYRINGE | Freq: Once | INTRAMUSCULAR | Status: AC
  Administered 2023-12-18: 50 ug via INTRAVENOUS
  Filled 2023-12-18: qty 1

## 2023-12-18 NOTE — ED Provider Notes (Addendum)
 [place] EMERGENCY DEPARTMENT AT [facility] Provider Note   CSN: [id number] Arrival date & time: 12/18/23  8177     Patient presents with: Headache and Emesis   [name] [name] is a 38 y.o. female.   38 year old female who presents with headache that began this morning when she woke up.  She has had some nausea vomiting associated with this.  Also some photophobia.  Also phonophobia.  Headache starts in her bitemporal area.  No neck pain or fever.  No new focal weakness.  No changes to her gait.  No treatment use prior to arrival       Prior to Admission medications   Medication Sig Start Date End Date Taking? Authorizing Provider  Accu-Chek Softclix Lancets lancets Use as directed to check blood glucose TID 07/16/22   Joshua Domino, DO  Blood Glucose Monitoring Suppl (ACCU-CHEK GUIDE) w/Device KIT Use as directed to check blood glucose TID 08/05/22   McDiarmid, Krystal BIRCH, MD  Blood Pressure Monitoring (COMFORT TOUCH BP CUFF/MEDIUM) MISC 1 each by Does not apply route daily. 07/21/23   Rosendo Rush, MD  Continuous Glucose Sensor (FREESTYLE LIBRE 3 PLUS SENSOR) MISC Change sensor every 15 days. 10/13/23   Joshua Domino, DO  EPINEPHrine  (EPIPEN  2-PAK) 0.3 mg/0.3 mL IJ SOAJ injection Inject one pin at onset of throat tightening or shortness of breath.  CALL 911.  Repeat injection with second pen after 5 minutes if no improvement after first injection. 10/12/22   Joesph Shaver Scales, PA-C  glucose blood (ACCU-CHEK GUIDE) test strip Use as directed to check blood glucose TID 07/16/22   Joshua Domino, DO  insulin  aspart (NOVOLOG ) 100 UNIT/ML FlexPen CBG < 70: Implement Hypoglycemia measures CBG 70 - 120: 0 units CBG 121 - 150: 3 units CBG 151 - 200: 4 units CBG 201 - 250: 7 units CBG 251 - 300: 11 units CBG 301 - 350: 15 units CBG 351 - 400: 20 units CBG > 400: call MD 10/13/23   Joshua Domino, DO  insulin  glargine (LANTUS ) 100 UNIT/ML Solostar Pen Inject 18 Units into the skin every morning.  10/13/23   Joshua Domino, DO  Insulin  Pen Needle (PEN NEEDLES) 32G X 4 MM MISC Use to inject insulin  once daily 05/22/21   Donzetta Rollene BRAVO, MD  losartan  (COZAAR ) 25 MG tablet Take 1 tablet (25 mg total) by mouth at bedtime. 07/21/23   Rosendo Rush, MD  tirzepatide  (MOUNJARO ) 2.5 MG/0.5ML Pen Inject 2.5 mg into the skin once a week. 10/13/23   Joshua Domino, DO    Allergies: Bee venom and Metformin  and related    Review of Systems  All other systems reviewed and are negative.   Updated Vital Signs BP (!) 160/81   Pulse (!) 111   Temp 99.7 F (37.6 C) (Oral)   Resp 20   Ht 1.549 m (5' 1)   Wt 97.5 kg   LMP 11/09/2023   SpO2 100%   BMI 40.62 kg/m   Physical Exam Vitals and nursing note reviewed.  Constitutional:      General: She is not in acute distress.    Appearance: Normal appearance. She is well-developed. She is not toxic-appearing.  HENT:     Head: Normocephalic and atraumatic.  Eyes:     General: Lids are normal.     Conjunctiva/sclera: Conjunctivae normal.     Pupils: Pupils are equal, round, and reactive to light.  Neck:     Thyroid : No thyroid  mass.  Trachea: No tracheal deviation.     Comments: No nuchal rigidity noted. Cardiovascular:     Rate and Rhythm: Normal rate and regular rhythm.     Heart sounds: Normal heart sounds. No murmur heard.    No gallop.  Pulmonary:     Effort: Pulmonary effort is normal. No respiratory distress.     Breath sounds: Normal breath sounds. No stridor. No decreased breath sounds, wheezing, rhonchi or rales.  Abdominal:     General: There is no distension.     Palpations: Abdomen is soft.     Tenderness: There is no abdominal tenderness. There is no rebound.  Musculoskeletal:        General: No tenderness. Normal range of motion.     Cervical back: Normal range of motion and neck supple.  Skin:    General: Skin is warm and dry.     Findings: No abrasion or rash.  Neurological:     General: No focal deficit present.      Mental Status: She is alert and oriented to person, place, and time. Mental status is at baseline.     GCS: GCS eye subscore is 4. GCS verbal subscore is 5. GCS motor subscore is 6.     Cranial Nerves: No cranial nerve deficit.     Sensory: No sensory deficit.     Motor: Motor function is intact.  Psychiatric:        Attention and Perception: Attention normal.        Speech: Speech normal.        Behavior: Behavior normal.     (all labs ordered are listed, but only abnormal results are displayed) Labs Reviewed  CBG MONITORING, ED - Abnormal; Notable for the following components:      Result Value   Glucose-Capillary 223 (*)    All other components within normal limits    EKG: None  Radiology: No results found.   Procedures   Medications Ordered in the ED  metoCLOPramide  (REGLAN ) injection 10 mg (has no administration in time range)                                    Medical Decision Making Amount and/or Complexity of Data Reviewed Radiology: ordered.  Risk Prescription drug management.   CT of head without acute findings here.  Low suspicion for subarachnoid hemorrhage or meningitis.  She has no nuchal rigidity.  Neck is supple.  Patient medicated for suspected migraine and feels better at this time.  Repeat neurological exam at time of discharge stable.  Will send home     Final diagnoses:  None    ED Discharge Orders     None          Dasie Faden, MD 12/18/23 2151    Dasie Faden, MD 12/18/23 2152

## 2023-12-18 NOTE — ED Triage Notes (Addendum)
 Headache and vomiting onset this am. PMH HTN and DM. Denies diarrhea and abd pain. Ibuprofen  at 0900.

## 2023-12-25 ENCOUNTER — Encounter: Payer: Self-pay | Admitting: Family Medicine

## 2023-12-25 ENCOUNTER — Ambulatory Visit (INDEPENDENT_AMBULATORY_CARE_PROVIDER_SITE_OTHER): Payer: Self-pay | Admitting: Family Medicine

## 2023-12-25 VITALS — BP 134/65 | HR 78 | Ht 61.0 in | Wt 217.5 lb

## 2023-12-25 DIAGNOSIS — Z3046 Encounter for surveillance of implantable subdermal contraceptive: Secondary | ICD-10-CM

## 2023-12-25 NOTE — Patient Instructions (Signed)
 It was wonderful to see you today!  They will remove your Nexplanon .  Below are the instructions for care.  You may experience some bruising or bleeding from the incision site.  Please call [phone] with any questions about today's appointment.   If you need any additional refills, please call your pharmacy before calling the office.  Lucie Pinal, DO Family Medicine

## 2023-12-25 NOTE — Progress Notes (Signed)
    SUBJECTIVE:   CHIEF COMPLAINT / HPI:   Nexplanon  removal.  Patient has bilateral tubal ligation and will not need further birth control measures.  PERTINENT  PMH / PSH: Menorrhagia  OBJECTIVE:   BP 134/65   Pulse 78   Ht 5' 1 (1.549 m)   Wt 217 lb 8 oz (98.7 kg)   LMP 12/21/2023   SpO2 100%   BMI 41.10 kg/m    Nexplanon  Removal  After verbal and written consent was obtained, patient was placed in supine position. The patient's left arm was flexed at the elbow and externally rotated so that the wrist was parallel to their ear. The device was palpated and marked. The area was cleansed with alcohol and 1% lidocaine  with epinephrine  was injected just under the distal end of the device. The site was cleaned with Betadine x3 and the area surrounding the device was covered with a sterile drape. A scalpel was used to create a small incision, and the device was pushed towards the incision. Fibrous tissue surrounding the device was gradually removed from the device. The device was grasped, removed and measured to ensure all 4 cm of device was removed. Steri-strips were used to close the incision. Pressure dressing was applied to the patient. The patient was instructed to remove the pressure dressing in 24 hours, and use backup contraception for 1 week. The patient denied any concerns or complaints and tolerated the procedure well.          ASSESSMENT/PLAN:   Assessment & Plan Surveillance of previously prescribed implantable subdermal contraceptive [Z30.46] -successful removal of device without re-placement -aftercare instructions provided -provided patient with contact information for referral as previously discussed.    Lucie Pinal, DO [facility]

## 2024-01-01 ENCOUNTER — Emergency Department (HOSPITAL_BASED_OUTPATIENT_CLINIC_OR_DEPARTMENT_OTHER): Admitting: Radiology

## 2024-01-01 ENCOUNTER — Emergency Department (HOSPITAL_BASED_OUTPATIENT_CLINIC_OR_DEPARTMENT_OTHER)
Admission: EM | Admit: 2024-01-01 | Discharge: 2024-01-01 | Disposition: A | Discharge status: Home / Self Care | Payer: Worker's Compensation | Attending: Emergency Medicine | Admitting: Emergency Medicine

## 2024-01-01 ENCOUNTER — Encounter (HOSPITAL_BASED_OUTPATIENT_CLINIC_OR_DEPARTMENT_OTHER): Payer: Self-pay | Admitting: Emergency Medicine

## 2024-01-01 DIAGNOSIS — M79642 Pain in left hand: Secondary | ICD-10-CM | POA: Diagnosis present

## 2024-01-01 DIAGNOSIS — Z794 Long term (current) use of insulin: Secondary | ICD-10-CM | POA: Insufficient documentation

## 2024-01-01 DIAGNOSIS — W230XXA Caught, crushed, jammed, or pinched between moving objects, initial encounter: Secondary | ICD-10-CM | POA: Insufficient documentation

## 2024-01-01 MED ORDER — IBUPROFEN 400 MG PO TABS
600.0000 mg | ORAL_TABLET | Freq: Once | ORAL | Status: AC
  Administered 2024-01-01: 600 mg via ORAL
  Filled 2024-01-01: qty 1

## 2024-01-01 NOTE — ED Provider Notes (Signed)
 [place] EMERGENCY DEPARTMENT AT [facility] Provider Note   CSN: [id number] Arrival date & time: 01/01/24  1811     Patient presents with: Hand Pain  HPI [name] [name] is a 38 y.o. female presenting for left hand pain.  Started a week ago when she slammed her hand in the cooler.  Endorsing swelling and pain primarily in the left thumbnail.  Still able to move her thumb and fingers and hand normally.  Denies any loss of sensation or numbness.  Has been taking Tylenol  for pain.    Hand Pain       Prior to Admission medications   Medication Sig Start Date End Date Taking? Authorizing Provider  Accu-Chek Softclix Lancets lancets Use as directed to check blood glucose TID 07/16/22   Joshua Domino, DO  Blood Glucose Monitoring Suppl (ACCU-CHEK GUIDE) w/Device KIT Use as directed to check blood glucose TID 08/05/22   McDiarmid, Krystal BIRCH, MD  Blood Pressure Monitoring (COMFORT TOUCH BP CUFF/MEDIUM) MISC 1 each by Does not apply route daily. 07/21/23   Rosendo Rush, MD  Continuous Glucose Sensor (FREESTYLE LIBRE 3 PLUS SENSOR) MISC Change sensor every 15 days. 10/13/23   Joshua Domino, DO  EPINEPHrine  (EPIPEN  2-PAK) 0.3 mg/0.3 mL IJ SOAJ injection Inject one pin at onset of throat tightening or shortness of breath.  CALL 911.  Repeat injection with second pen after 5 minutes if no improvement after first injection. 10/12/22   Joesph Shaver Scales, PA-C  glucose blood (ACCU-CHEK GUIDE) test strip Use as directed to check blood glucose TID 07/16/22   Joshua Domino, DO  insulin  aspart (NOVOLOG ) 100 UNIT/ML FlexPen CBG < 70: Implement Hypoglycemia measures CBG 70 - 120: 0 units CBG 121 - 150: 3 units CBG 151 - 200: 4 units CBG 201 - 250: 7 units CBG 251 - 300: 11 units CBG 301 - 350: 15 units CBG 351 - 400: 20 units CBG > 400: call MD 10/13/23   Joshua Domino, DO  insulin  glargine (LANTUS ) 100 UNIT/ML Solostar Pen Inject 18 Units into the skin every morning. 10/13/23   Joshua Domino, DO  Insulin  Pen  Needle (PEN NEEDLES) 32G X 4 MM MISC Use to inject insulin  once daily 05/22/21   Donzetta Rollene BRAVO, MD  losartan  (COZAAR ) 25 MG tablet Take 1 tablet (25 mg total) by mouth at bedtime. 07/21/23   Rosendo Rush, MD  tirzepatide  (MOUNJARO ) 2.5 MG/0.5ML Pen Inject 2.5 mg into the skin once a week. 10/13/23   Joshua Domino, DO    Allergies: Bee venom and Metformin  and related    Review of Systems See HPI  Updated Vital Signs BP (!) 163/97   Pulse 90   Temp 97.8 F (36.6 C) (Oral)   Resp 18   LMP 12/21/2023   SpO2 100%   Physical Exam Constitutional:      Appearance: Normal appearance.  HENT:     Head: Normocephalic.     Nose: Nose normal.  Eyes:     Conjunctiva/sclera: Conjunctivae normal.  Pulmonary:     Effort: Pulmonary effort is normal.  Musculoskeletal:     Comments: Left hand appears normal with no obvious swelling.  Range of motion in the fingers and wrist is also normal.  Cap refill is brisk distally.  Radial pulses 2+.  Sensation intact distally to light touch.  No obvious deformity noted.  Neurological:     Mental Status: She is alert.  Psychiatric:        Mood and Affect:  Mood normal.     (all labs ordered are listed, but only abnormal results are displayed) Labs Reviewed - No data to display  EKG: None  Radiology: DG Hand Complete Left Result Date: 01/01/2024 CLINICAL DATA:  Hand trauma-pain in thumb EXAM: LEFT HAND - COMPLETE 3+ VIEW COMPARISON:  None Available. FINDINGS: No acute fracture or dislocation. There is no evidence of arthropathy or other focal bone abnormality. Soft tissue swelling about the thumb. No radiopaque foreign body. IMPRESSION: Soft tissue swelling about the thumb. No acute fracture or dislocation. Electronically Signed   By: Rogelia Myers M.D.   On: 01/01/2024 19:58     Procedures   Medications Ordered in the ED  ibuprofen  (ADVIL ) tablet 600 mg (has no administration in time range)                                    Medical  Decision Making Amount and/or Complexity of Data Reviewed Radiology: ordered.   38 year old well-appearing female presenting for left hand pain after being slammed in a cooler a week ago.  Exam is unremarkable.  Suspect this is still a soft tissue swelling that could be contributing.  X-ray is negative.  Advised RICE and NSAIDs and to follow-up with her PCP if her symptoms persisted.  Discharged with condition.     Final diagnoses:  Hand pain, left    ED Discharge Orders     None          Lang Norleen POUR, PA-C 01/01/24 2019    Mannie Pac T, DO 01/03/24 1531

## 2024-01-01 NOTE — Discharge Instructions (Signed)
 Ration of your left hand was reassuring.  Please follow-up with your PCP if your symptoms persist.  In the meantime as we discussed recommend applying ice 3-4 times a day for the next 4 days and taking ibuprofen .

## 2024-01-01 NOTE — ED Triage Notes (Signed)
 Slammed hand in cooler last week. Swelling and pain in left thumb.

## 2024-02-04 ENCOUNTER — Ambulatory Visit: Admitting: Family Medicine

## 2024-02-04 NOTE — Progress Notes (Deleted)
    SUBJECTIVE:   CHIEF COMPLAINT / HPI:   R toe pain and numbness ***  PERTINENT  PMH / PSH: ***  OBJECTIVE:   There were no vitals taken for this visit.  ***  ASSESSMENT/PLAN:   Assessment & Plan    Payton Coward, MD [facility]

## 2024-05-09 ENCOUNTER — Other Ambulatory Visit (HOSPITAL_COMMUNITY): Payer: Self-pay

## 2024-05-09 MED ORDER — CIPROFLOXACIN HCL 0.3 % OP SOLN
1.0000 [drp] | Freq: Four times a day (QID) | OPHTHALMIC | 0 refills | Status: AC
  Filled 2024-05-09 – 2024-06-01 (×2): qty 5, 25d supply, fill #0

## 2024-05-19 ENCOUNTER — Other Ambulatory Visit (HOSPITAL_COMMUNITY): Payer: Self-pay

## 2024-06-01 ENCOUNTER — Other Ambulatory Visit (HOSPITAL_COMMUNITY): Payer: Self-pay

## 2024-06-08 ENCOUNTER — Ambulatory Visit: Payer: Self-pay | Admitting: Family Medicine

## 2024-06-08 ENCOUNTER — Encounter: Payer: Self-pay | Admitting: Family Medicine

## 2024-06-08 ENCOUNTER — Other Ambulatory Visit (HOSPITAL_COMMUNITY): Payer: Self-pay

## 2024-06-08 VITALS — BP 134/85 | HR 82 | Ht 61.0 in | Wt 223.1 lb

## 2024-06-08 DIAGNOSIS — M722 Plantar fascial fibromatosis: Secondary | ICD-10-CM

## 2024-06-08 MED ORDER — NAPROXEN 500 MG PO TABS
500.0000 mg | ORAL_TABLET | Freq: Two times a day (BID) | ORAL | 0 refills | Status: AC
  Filled 2024-06-08: qty 30, 15d supply, fill #0

## 2024-06-08 NOTE — Patient Instructions (Addendum)
 It was wonderful to see you today!  Your foot pain is likely caused by plantar fasciitis this happens a lot in people who are on their feet daily and working hard.  I have prescribed a medicine called Naprosyn  to help with the inflammation that is the root of the pain.  You will take this medicine 2 times a day for 2 weeks which should reduce the amount of swelling around the ligament.  Below are a few stretches/exercises that can help relieve pain.  Do these when you first get home to help reduce the pain after walking around.     The fascial roll in particular can be very nice for pain relief after a long day.  You can use a ball or frozen water bottle if you prefer.  The frozen water bottle has the added benefit of pain relief as the cold will help reduce inflammation and dull pain symptoms.  Please call [phone] with any questions about today's appointment.   If you need any additional refills, please call your pharmacy before calling the office.  Lucie Pinal, DO Family Medicine

## 2024-06-08 NOTE — Progress Notes (Unsigned)
" ° ° °  SUBJECTIVE:   CHIEF COMPLAINT / HPI:   BL foot pain/numbness - present for the last two months - doesn't bother her when she is up moving, just at the end of the day - aching on the ball of the foot, intermittent pins and needles between the fourth and fifth toes bilaterally - tylenol  and ibuprofen  help some, but not much  - usually resolves overnight  PERTINENT  PMH / PSH: T2DM  OBJECTIVE:   BP 134/85   Pulse 82   Ht 5' 1 (1.549 m)   Wt 223 lb 2 oz (101.2 kg)   LMP 05/27/2024   SpO2 98%   BMI 42.16 kg/m   Foot: Normal ROM at the ankle and knee BL, able to move all digits. No bruising or caluses noted on either foot. Some edema in the foot pad near the ball of the foot bilaterally  ASSESSMENT/PLAN:   Assessment & Plan Plantar fasciitis - most likely given location and onset of pain - recommended 2 week course of naprosyn  plus some stretching exercises - patient will return to the office in two weeks to discuss diabetes and will follow up about this issue at that time.    Lucie Pinal, DO [facility] "

## 2024-06-22 ENCOUNTER — Ambulatory Visit: Payer: Self-pay | Admitting: Family Medicine
